# Patient Record
Sex: Male | Born: 1940 | Race: White | Hispanic: No | Marital: Married | Smoking: Former smoker
Health system: Southern US, Community
[De-identification: ages and names within clinical notes are randomized; demographics above are authoritative.]

## PROBLEM LIST (undated history)

## (undated) DIAGNOSIS — N183 Chronic kidney disease, stage 3 unspecified: Secondary | ICD-10-CM

## (undated) DIAGNOSIS — I10 Essential (primary) hypertension: Secondary | ICD-10-CM

## (undated) DIAGNOSIS — G4733 Obstructive sleep apnea (adult) (pediatric): Secondary | ICD-10-CM

## (undated) DIAGNOSIS — K219 Gastro-esophageal reflux disease without esophagitis: Secondary | ICD-10-CM

## (undated) DIAGNOSIS — M1990 Unspecified osteoarthritis, unspecified site: Secondary | ICD-10-CM

## (undated) DIAGNOSIS — I471 Supraventricular tachycardia, unspecified: Secondary | ICD-10-CM

## (undated) DIAGNOSIS — C259 Malignant neoplasm of pancreas, unspecified: Secondary | ICD-10-CM

## (undated) DIAGNOSIS — T7840XA Allergy, unspecified, initial encounter: Secondary | ICD-10-CM

## (undated) DIAGNOSIS — E785 Hyperlipidemia, unspecified: Secondary | ICD-10-CM

## (undated) DIAGNOSIS — N201 Calculus of ureter: Secondary | ICD-10-CM

## (undated) DIAGNOSIS — Z9989 Dependence on other enabling machines and devices: Secondary | ICD-10-CM

## (undated) DIAGNOSIS — C801 Malignant (primary) neoplasm, unspecified: Secondary | ICD-10-CM

## (undated) DIAGNOSIS — IMO0001 Reserved for inherently not codable concepts without codable children: Secondary | ICD-10-CM

## (undated) DIAGNOSIS — N200 Calculus of kidney: Secondary | ICD-10-CM

## (undated) DIAGNOSIS — I2510 Atherosclerotic heart disease of native coronary artery without angina pectoris: Secondary | ICD-10-CM

## (undated) HISTORY — DX: Hyperlipidemia, unspecified: E78.5

## (undated) HISTORY — PX: OTHER SURGICAL HISTORY: SHX169

## (undated) HISTORY — DX: Atherosclerotic heart disease of native coronary artery without angina pectoris: I25.10

## (undated) HISTORY — DX: Supraventricular tachycardia: I47.1

## (undated) HISTORY — DX: Supraventricular tachycardia, unspecified: I47.10

---

## 1992-06-15 HISTORY — PX: CORONARY ANGIOPLASTY: SHX604

## 1998-06-15 HISTORY — PX: CARDIAC CATHETERIZATION: SHX172

## 2000-06-15 HISTORY — PX: CARDIAC ELECTROPHYSIOLOGY STUDY AND ABLATION: SHX1294

## 2000-09-09 ENCOUNTER — Inpatient Hospital Stay (HOSPITAL_COMMUNITY): Admission: EM | Admit: 2000-09-09 | Discharge: 2000-09-10 | Payer: Self-pay | Admitting: Emergency Medicine

## 2000-09-09 ENCOUNTER — Encounter: Payer: Self-pay | Admitting: Emergency Medicine

## 2000-10-28 ENCOUNTER — Ambulatory Visit (HOSPITAL_COMMUNITY): Admission: RE | Admit: 2000-10-28 | Discharge: 2000-10-29 | Payer: Self-pay | Admitting: Internal Medicine

## 2000-12-01 ENCOUNTER — Encounter: Admission: RE | Admit: 2000-12-01 | Discharge: 2000-12-27 | Payer: Self-pay | Admitting: Internal Medicine

## 2001-05-31 ENCOUNTER — Encounter: Admission: RE | Admit: 2001-05-31 | Discharge: 2001-06-14 | Payer: Self-pay | Admitting: Orthopedic Surgery

## 2001-07-06 ENCOUNTER — Encounter: Admission: RE | Admit: 2001-07-06 | Discharge: 2001-09-16 | Payer: Self-pay | Admitting: Internal Medicine

## 2001-10-04 ENCOUNTER — Encounter (HOSPITAL_BASED_OUTPATIENT_CLINIC_OR_DEPARTMENT_OTHER): Admission: RE | Admit: 2001-10-04 | Discharge: 2002-01-02 | Payer: Self-pay | Admitting: Orthopedic Surgery

## 2003-06-19 ENCOUNTER — Encounter: Admission: RE | Admit: 2003-06-19 | Discharge: 2003-06-19 | Payer: Self-pay | Admitting: Urology

## 2003-06-22 ENCOUNTER — Ambulatory Visit (HOSPITAL_COMMUNITY): Admission: RE | Admit: 2003-06-22 | Discharge: 2003-06-22 | Payer: Self-pay | Admitting: Urology

## 2003-06-22 ENCOUNTER — Ambulatory Visit (HOSPITAL_BASED_OUTPATIENT_CLINIC_OR_DEPARTMENT_OTHER): Admission: RE | Admit: 2003-06-22 | Discharge: 2003-06-22 | Payer: Self-pay | Admitting: Urology

## 2003-06-22 HISTORY — PX: OTHER SURGICAL HISTORY: SHX169

## 2003-08-03 ENCOUNTER — Emergency Department (HOSPITAL_COMMUNITY): Admission: EM | Admit: 2003-08-03 | Discharge: 2003-08-03 | Payer: Self-pay | Admitting: Emergency Medicine

## 2005-03-26 ENCOUNTER — Inpatient Hospital Stay (HOSPITAL_COMMUNITY): Admission: EM | Admit: 2005-03-26 | Discharge: 2005-03-28 | Payer: Self-pay | Admitting: Emergency Medicine

## 2005-03-26 ENCOUNTER — Encounter (INDEPENDENT_AMBULATORY_CARE_PROVIDER_SITE_OTHER): Payer: Self-pay | Admitting: Specialist

## 2005-03-26 HISTORY — PX: LAPAROSCOPIC CHOLECYSTECTOMY: SUR755

## 2005-06-12 ENCOUNTER — Ambulatory Visit (HOSPITAL_BASED_OUTPATIENT_CLINIC_OR_DEPARTMENT_OTHER): Admission: RE | Admit: 2005-06-12 | Discharge: 2005-06-12 | Payer: Self-pay | Admitting: Family Medicine

## 2005-06-21 ENCOUNTER — Ambulatory Visit: Payer: Self-pay | Admitting: Internal Medicine

## 2006-01-20 ENCOUNTER — Ambulatory Visit: Payer: Self-pay | Admitting: Internal Medicine

## 2006-02-04 ENCOUNTER — Ambulatory Visit: Payer: Self-pay | Admitting: Internal Medicine

## 2006-02-04 ENCOUNTER — Encounter (INDEPENDENT_AMBULATORY_CARE_PROVIDER_SITE_OTHER): Payer: Self-pay | Admitting: Specialist

## 2006-06-28 ENCOUNTER — Ambulatory Visit: Payer: Self-pay | Admitting: Cardiology

## 2006-06-29 LAB — CONVERTED CEMR LAB
Chloride: 106 meq/L (ref 96–112)
Creatinine, Ser: 1 mg/dL (ref 0.4–1.5)
Glucose, Bld: 94 mg/dL (ref 70–99)
HCT: 41.7 % (ref 39.0–52.0)
Hemoglobin: 14.5 g/dL (ref 13.0–17.0)
MCV: 97.1 fL (ref 78.0–100.0)
RDW: 12.1 % (ref 11.5–14.6)
Sodium: 138 meq/L (ref 135–145)
WBC: 10 10*3/uL (ref 4.5–10.5)

## 2006-06-30 ENCOUNTER — Ambulatory Visit: Payer: Self-pay | Admitting: Cardiology

## 2006-06-30 ENCOUNTER — Inpatient Hospital Stay (HOSPITAL_BASED_OUTPATIENT_CLINIC_OR_DEPARTMENT_OTHER): Admission: RE | Admit: 2006-06-30 | Discharge: 2006-06-30 | Payer: Self-pay | Admitting: Cardiology

## 2006-07-07 ENCOUNTER — Ambulatory Visit: Payer: Self-pay

## 2006-07-14 ENCOUNTER — Ambulatory Visit: Payer: Self-pay | Admitting: Cardiology

## 2006-09-06 ENCOUNTER — Ambulatory Visit: Payer: Self-pay | Admitting: Cardiology

## 2007-04-26 ENCOUNTER — Ambulatory Visit: Payer: Self-pay | Admitting: Cardiology

## 2007-04-26 LAB — CONVERTED CEMR LAB
Basophils Absolute: 0.1 10*3/uL (ref 0.0–0.1)
CO2: 28 meq/L (ref 19–32)
Creatinine, Ser: 1 mg/dL (ref 0.4–1.5)
Glucose, Bld: 146 mg/dL — ABNORMAL HIGH (ref 70–99)
HCT: 41.1 % (ref 39.0–52.0)
Hemoglobin: 14.6 g/dL (ref 13.0–17.0)
INR: 0.9 (ref 0.8–1.0)
MCHC: 35.5 g/dL (ref 30.0–36.0)
Monocytes Absolute: 0.9 10*3/uL — ABNORMAL HIGH (ref 0.2–0.7)
Neutrophils Relative %: 58.4 % (ref 43.0–77.0)
Potassium: 4 meq/L (ref 3.5–5.1)
RDW: 12 % (ref 11.5–14.6)
Sodium: 139 meq/L (ref 135–145)
aPTT: 27.2 s (ref 21.7–29.8)

## 2007-04-27 ENCOUNTER — Inpatient Hospital Stay (HOSPITAL_BASED_OUTPATIENT_CLINIC_OR_DEPARTMENT_OTHER): Admission: RE | Admit: 2007-04-27 | Discharge: 2007-04-27 | Payer: Self-pay | Admitting: Cardiology

## 2007-04-27 ENCOUNTER — Ambulatory Visit: Payer: Self-pay | Admitting: Cardiology

## 2007-04-27 HISTORY — PX: CARDIAC CATHETERIZATION: SHX172

## 2007-05-02 ENCOUNTER — Ambulatory Visit: Payer: Self-pay | Admitting: Cardiology

## 2007-05-03 ENCOUNTER — Ambulatory Visit: Payer: Self-pay | Admitting: Internal Medicine

## 2007-05-24 ENCOUNTER — Ambulatory Visit: Payer: Self-pay | Admitting: Cardiology

## 2007-05-25 ENCOUNTER — Ambulatory Visit: Payer: Self-pay | Admitting: Cardiology

## 2007-05-25 LAB — CONVERTED CEMR LAB
Albumin: 3.6 g/dL (ref 3.5–5.2)
Alkaline Phosphatase: 43 units/L (ref 39–117)
LDL Cholesterol: 34 mg/dL (ref 0–99)
Total CHOL/HDL Ratio: 2.9
VLDL: 30 mg/dL (ref 0–40)

## 2007-08-23 ENCOUNTER — Ambulatory Visit: Payer: Self-pay | Admitting: Cardiology

## 2007-08-25 ENCOUNTER — Ambulatory Visit: Payer: Self-pay | Admitting: Cardiology

## 2007-08-25 LAB — CONVERTED CEMR LAB
Direct LDL: 53.1 mg/dL
HDL: 34.9 mg/dL — ABNORMAL LOW (ref >39.0)
Triglycerides: 279 mg/dL (ref 0–149)
VLDL: 56 mg/dL — ABNORMAL HIGH (ref 0–40)

## 2007-09-15 ENCOUNTER — Ambulatory Visit: Payer: Self-pay | Admitting: Cardiology

## 2007-12-08 ENCOUNTER — Ambulatory Visit: Payer: Self-pay | Admitting: Internal Medicine

## 2007-12-08 LAB — CONVERTED CEMR LAB
ALT: 30 units/L (ref 0–53)
Alkaline Phosphatase: 48 units/L (ref 39–117)
Bilirubin, Direct: 0.1 mg/dL (ref 0.0–0.3)
Total Bilirubin: 0.8 mg/dL (ref 0.3–1.2)
Total Protein: 6.9 g/dL (ref 6.0–8.3)

## 2008-04-30 ENCOUNTER — Ambulatory Visit: Payer: Self-pay | Admitting: Cardiology

## 2008-11-22 ENCOUNTER — Telehealth: Payer: Self-pay | Admitting: Cardiology

## 2008-12-15 ENCOUNTER — Ambulatory Visit: Payer: Self-pay | Admitting: Diagnostic Radiology

## 2008-12-15 ENCOUNTER — Emergency Department (HOSPITAL_BASED_OUTPATIENT_CLINIC_OR_DEPARTMENT_OTHER): Admission: EM | Admit: 2008-12-15 | Discharge: 2008-12-15 | Payer: Self-pay | Admitting: Emergency Medicine

## 2009-11-14 ENCOUNTER — Telehealth: Payer: Self-pay | Admitting: Cardiology

## 2010-01-09 ENCOUNTER — Telehealth: Payer: Self-pay | Admitting: Cardiology

## 2010-02-06 ENCOUNTER — Ambulatory Visit: Payer: Self-pay | Admitting: Cardiology

## 2010-02-06 DIAGNOSIS — I2510 Atherosclerotic heart disease of native coronary artery without angina pectoris: Secondary | ICD-10-CM

## 2010-02-06 DIAGNOSIS — I119 Hypertensive heart disease without heart failure: Secondary | ICD-10-CM | POA: Insufficient documentation

## 2010-02-06 DIAGNOSIS — E782 Mixed hyperlipidemia: Secondary | ICD-10-CM

## 2010-07-17 NOTE — Assessment & Plan Note (Signed)
Summary: f1y   Visit Type:  Follow-up Primary Provider:  Dr. Cam Hai  CC:  No cardiac complaints- only occasional dizziness.Marland Kitchen  History of Present Illness: The patient is 70 years old returned for management of CAD.  He had a remote PTCA and DCA of the LAD. He underwent catheterization in 2000 and because of anginal chest pain and was found to have nonobstructive CAD. 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  He couldn't afford the latter and later Cardizem was discontinued and he is currently only on metoprolol.  He says he's been doing quite well. He's had no chest pain shortness of breath or palpitations. 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 He is fairly active and has had no chest pain with these activities.  His other problems include diabetes and hyperlipidemia. He's had elevated triglycerides and low HDL. His primary care physician is managing these problems he also has a history of SVT and is status post ablation for this.  Current Medications (verified): 1)  Omeprazole 20 Mg Tbec (Omeprazole) .... Take 2 Capsule By Mouth Once A Day 2)  Simvastatin 40 Mg Tabs (Simvastatin) .... Take 1 Tablet By Mouth Once A Day 3)  Metoprolol Tartrate 100 Mg Tabs (Metoprolol Tartrate) .... Take Two Tablets Every Morning and One Tablet Every Evening 4)  Glimepiride 4 Mg Tabs (Glimepiride) .... Take One Tab By Mouth Once Daily 5)  Metformin Hcl 1000 Mg Tabs (Metformin Hcl) .... Take One Tab By Mouth Once Daily 6)  Doxazosin Mesylate 2 Mg Tabs (Doxazosin Mesylate) .... Take One Tab By Mouth Once Daily 7)  Lisinopril 20 Mg Tabs (Lisinopril) .... Take One Tab By Mouth Once Daily 8)  Januvia 100 Mg Tabs (Sitagliptin Phosphate) .... Take One Tab By Mouth Once Daily 9)  Aspirin 81 Mg Tbec (Aspirin) .... Take One Tablet By Mouth Daily 10)  Centrum Silver Ultra Mens  Tabs (Multiple Vitamins-Minerals) .... Take One Tab By Mouth Once  Daily 11)  Fish Oil 1000 Mg Caps (Omega-3 Fatty Acids) .... Take 4 Tabs By Mouth Once Daily  Allergies (verified): 1)  ! Penicillin 2)  ! Codeine  Past History:  Past Medical History: DM HL S/P RFA SVT  Review of Systems       ROS is negative except as outlined in HPI.   Vital Signs:  Patient profile:   70 year old male Height:      77 inches Weight:      286 pounds BMI:     34.04 BP sitting:   126 / 60  (right arm) Cuff size:   large  Vitals Entered By: Sherri Rad, RN, BSN (February 06, 2010 3:42 PM)  Physical Exam  Additional Exam:  Gen. Well-nourished, in no distress   Neck: No JVD, thyroid not enlarged, no carotid bruits Lungs: No tachypnea, clear without rales, rhonchi or wheezes Cardiovascular: Rhythm regular, PMI not displaced,  heart sounds  normal, no murmurs or gallops, no peripheral edema, pulses normal in all 4 extremities. Abdomen: BS normal, abdomen soft and non-tender without masses or organomegaly, no hepatosplenomegaly. MS: No deformities, no cyanosis or clubbing   Neuro:  No focal sns   Skin:  no lesions    Impression & Recommendations:  Problem # 1:  CORONARY ATHEROSCLEROSIS NATIVE CORONARY ARTERY (ICD-414.01) He had remote PTCA and [facility] of LAD and had nonobstructive CAD at catheterization in 2009. He has a history of what  we have thought to be microvascular angina but is not having any recent symptoms. We will continue current therapy. The following medications were removed from the medication list:    Ranexa 1000 Mg Xr12h-tab (Ranolazine) .Marland Kitchen... Take 1 tablet by mouth twice a day His updated medication list for this problem includes:    Metoprolol Tartrate 100 Mg Tabs (Metoprolol tartrate) .Marland Kitchen... Take two tablets every morning and one tablet every evening    Lisinopril 20 Mg Tabs (Lisinopril) .Marland Kitchen... Take one tab by mouth once daily    Aspirin 81 Mg Tbec (Aspirin) .Marland Kitchen... Take one tablet by mouth daily  Orders: EKG w/ Interpretation  (93000)  Problem # 2:  MIXED HYPERLIPIDEMIA (ICD-272.2) This is managed with simvastatin and is managed by his primary care physician. His updated medication list for this problem includes:    Simvastatin 40 Mg Tabs (Simvastatin) .Marland Kitchen... Take 1 tablet by mouth once a day  Orders: EKG w/ Interpretation (93000)  Problem # 3:  BEN HTN HEART DISEASE WITHOUT HEART FAIL (ICD-402.10) This is controlled on current medications. His updated medication list for this problem includes:    Metoprolol Tartrate 100 Mg Tabs (Metoprolol tartrate) .Marland Kitchen... Take two tablets every morning and one tablet every evening    Doxazosin Mesylate 2 Mg Tabs (Doxazosin mesylate) .Marland Kitchen... Take one tab by mouth once daily    Lisinopril 20 Mg Tabs (Lisinopril) .Marland Kitchen... Take one tab by mouth once daily    Aspirin 81 Mg Tbec (Aspirin) .Marland Kitchen... Take one tablet by mouth daily  Orders: EKG w/ Interpretation (93000)  Patient Instructions: 1)  Your physician recommends that you continue on your current medications as directed. Please refer to the Current Medication list given to you today. 2)  Your physician wants you to follow-up in: 1 year with Dr. Clifton James.   You will receive a reminder letter in the mail two months in advance. If you don't receive a letter, please call our office to schedule the follow-up appointment.

## 2010-07-17 NOTE — Progress Notes (Signed)
Summary: refill pt has been on 100 mg tid  Phone Note Refill Request Message from:  Patient on November 14, 2009 3:48 PM  Refills Requested: Medication #1:  METOPROLOL SUCCINATE 100 MG XR24H-TAB Take 3 tablet by mouth as directed   Supply Requested: 3 months   Notes: Please see new Dosage [facility]   Method Requested: Fax to Local Pharmacy Initial call taken by: Migdalia Dk,  November 14, 2009 3:48 PM  Follow-up for Phone Call        last ov with Dr Juanda Chance 08-23-07 -?metoprolol dose Metoprolol was 50 mg three times a day, who increased it??  Also pt needs ROV for f/u  Sander Nephew, RN Pt returning call about his medication Judie Grieve  November 15, 2009 2:22 PM  Additional Follow-up for Phone Call Additional follow up Details #1::        [facility] pt has been on 100 mg Metoprolol tartrate 100 mg three times a day are well over one year.  RX refilled for 100mg  three times a day # 90 with one refill only.  pt needs appt to be seen. he is aware and will schedule today Additional Follow-up by: Charolotte Capuchin, RN,  November 15, 2009 3:50 PM

## 2010-07-17 NOTE — Progress Notes (Signed)
Summary: metoprolol done daj  Prescriptions: METOPROLOL TARTRATE 100 MG TABS (METOPROLOL TARTRATE) Take one tablet by mouth three times a day as directed  #90 x 6   Entered by:   Burnett Kanaris, CNA   Authorized by:   Lenoria Farrier, MD, [facility]   Signed by:   Burnett Kanaris, CNA on 01/09/2010   Method used:   [facility]ronically to        [facility] [phone]* (retail)       [address]       [place], [place]  [address]       Ph: [phone]       Fax: [phone]   RxID:   [id number]

## 2010-09-22 LAB — URINALYSIS, ROUTINE W REFLEX MICROSCOPIC
Glucose, UA: NEGATIVE mg/dL
Ketones, ur: NEGATIVE mg/dL
Protein, ur: NEGATIVE mg/dL
pH: 5.5 (ref 5.0–8.0)

## 2010-09-22 LAB — URINE MICROSCOPIC-ADD ON

## 2010-09-26 ENCOUNTER — Ambulatory Visit (HOSPITAL_BASED_OUTPATIENT_CLINIC_OR_DEPARTMENT_OTHER)
Admission: RE | Admit: 2010-09-26 | Discharge: 2010-09-26 | Disposition: A | Discharge status: Home / Self Care | Payer: Medicare Other | Source: Ambulatory Visit | Attending: Urology | Admitting: Urology

## 2010-09-26 ENCOUNTER — Ambulatory Visit (HOSPITAL_COMMUNITY): Payer: Medicare Other

## 2010-09-26 DIAGNOSIS — N201 Calculus of ureter: Secondary | ICD-10-CM | POA: Insufficient documentation

## 2010-09-26 DIAGNOSIS — Z9861 Coronary angioplasty status: Secondary | ICD-10-CM | POA: Insufficient documentation

## 2010-09-26 DIAGNOSIS — Z79899 Other long term (current) drug therapy: Secondary | ICD-10-CM | POA: Insufficient documentation

## 2010-09-26 DIAGNOSIS — N200 Calculus of kidney: Secondary | ICD-10-CM | POA: Insufficient documentation

## 2010-09-26 DIAGNOSIS — I10 Essential (primary) hypertension: Secondary | ICD-10-CM | POA: Insufficient documentation

## 2010-09-26 DIAGNOSIS — Z01812 Encounter for preprocedural laboratory examination: Secondary | ICD-10-CM | POA: Insufficient documentation

## 2010-09-26 DIAGNOSIS — I2510 Atherosclerotic heart disease of native coronary artery without angina pectoris: Secondary | ICD-10-CM | POA: Insufficient documentation

## 2010-09-26 DIAGNOSIS — E119 Type 2 diabetes mellitus without complications: Secondary | ICD-10-CM | POA: Insufficient documentation

## 2010-09-26 DIAGNOSIS — K219 Gastro-esophageal reflux disease without esophagitis: Secondary | ICD-10-CM | POA: Insufficient documentation

## 2010-09-26 DIAGNOSIS — E669 Obesity, unspecified: Secondary | ICD-10-CM | POA: Insufficient documentation

## 2010-09-26 DIAGNOSIS — Z01818 Encounter for other preprocedural examination: Secondary | ICD-10-CM | POA: Insufficient documentation

## 2010-09-26 HISTORY — PX: OTHER SURGICAL HISTORY: SHX169

## 2010-09-26 LAB — POCT I-STAT 4, (NA,K, GLUC, HGB,HCT)
Glucose, Bld: 217 mg/dL — ABNORMAL HIGH (ref 70–99)
HCT: 44 % (ref 39.0–52.0)
Hemoglobin: 15 g/dL (ref 13.0–17.0)
Potassium: 4.1 mEq/L (ref 3.5–5.1)
Sodium: 138 mEq/L (ref 135–145)

## 2010-09-26 LAB — GLUCOSE, CAPILLARY: Glucose-Capillary: 192 mg/dL — ABNORMAL HIGH (ref 70–99)

## 2010-10-02 NOTE — Op Note (Signed)
[name], [name]                 ACCOUNT NO.:  [id number]  MEDICAL RECORD NO.:  [id number]            PATIENT TYPE:  LOCATION:                                 FACILITY:  PHYSICIAN:  Terel Bann C. Vernie Ammons, M.D.       DATE OF BIRTH:  DATE OF PROCEDURE:  09/26/2010 DATE OF DISCHARGE:                              OPERATIVE REPORT   PREOPERATIVE DIAGNOSIS:  Right ureteral calculi.  POSTOPERATIVE DIAGNOSIS:  Right ureteral calculi.  PROCEDURES: 1. Right retrograde pyelogram including interpretation. 2. Right ureteroscopy. 3. Laser lithotripsy. 4. Ureteroscopic stone extraction. 5. Double-J stent placement.  SURGEON:  Ion Gonnella C. Vernie Ammons, MD  ANESTHESIA:  General.  SPECIMEN:  Stone given to the patient.  ESTIMATED BLOOD LOSS:  Minimal.  DRAINS:  5-French, 26 cm Polaris stent in the right ureter (with string).  COMPLICATIONS:  None.  INDICATIONS:  The patient is a 70 year old male who was seen in the office yesterday with severe right renal colic, which was found to be secondary to ureteral stones causing obstruction.  We discussed the treatment options and he has elected to proceed with ureteroscopic extraction of these stones, as they were too faint to be seen for lithotripsy.  The risks, complications, alternatives, and limitations have been discussed.  He understands and has elected to proceed.  DESCRIPTION OF OPERATION:  After informed consent, the patient was brought to the major OR, placed on the table and administered general anesthesia and then moved to the dorsal lithotomy position.  His genitalia was sterilely prepped and draped and an official time-out was then performed.  Rigid cystoscopy was performed with the 22-French cystoscope, which was passed under direct vision down the urethra, which was noted to be normal.  The prostatic urethra revealed no lesions and was mildly obstructing.  The bladder was then entered and noted to be free of any tumor, stones or  inflammatory lesions on complete and systematic inspection.  Ureteral orifices were normal in configuration and position.  The 6-French open-ended ureteral catheter was passed through the cystoscope and into the right ureteral orifice.  I then injected full- strength contrast under direct fluoroscopic visualization and noted, as the contrast was injected up the right ureter, several filling defects in the mid ureter.  Proximal to this, the ureter was dilated.  There were no other abnormalities noted of the ureter.  The collecting system appeared normal.  There were filling defects in the collecting system consistent with the stones that were seen on his preoperative KUB.  A 0.038-inch floppy tip guidewire was then passed through the open-ended catheter and up the right ureter into the area of the renal pelvis.  I then passed the ureteral access sheath over the guidewire and was able to gently advance this through the intramural ureter and up to the level of the stones where the guidewire and inner portion of the access sheaths were then removed.  Flexible ureteroscopy was then performed using a 6-French flexible ureteroscope and I was able to identify one of the stones and fragmented it using the 200 micron holmium laser fiber. Once this stone was  fully fragmented, I inserted the nitinol basket and extracted the fragmented portions of the stone.  I then reinserted the ureteroscope and fragmented further stone and extracted that.  Finally a large stone fragment most proximally was fragmented with the laser and then extracted with ureteroscope.  I then inserted the ureteroscope one last time through the access sheath and passed this to well beyond where the stone was located.  No stone fragments had migrated proximally.  I then removed the ureteroscope and access sheaths under direct vision after passing a guidewire through the ureteroscope and into the area of the renal pelvis.  No stone  fragments were noted along the course of the ureter and the ureter was noted to be intact without any bleeding or injury.  The guidewire was then left in place and the cystoscope was back loaded over the guidewire.  The stent was then passed over the guidewire through the cystoscope and up the right ureter into the area of the renal pelvis and as I removed the guidewire, good curl was noted in the renal pelvis.  I then drained the bladder and removed the cystoscope leaving the tether attached to the distal aspect of the stent and affixed to the dorsum of the penis.  The patient was then awakened and taken to recovery room in stable and satisfactory condition.  He tolerated the procedure well and there were no intraoperative complications.  He will be given a prescription for Pyridium 200 mg #36 and has a prescription for Dilaudid that I gave him yesterday should he have any pain.  He will follow up in my office in 1 week and written instructions were given at discharge.     Adeola Dennen C. Vernie Ammons, M.D.     MCO/MEDQ  D:  09/26/2010  T:  09/26/2010  Job:  [id number]  Electronically Signed by Ihor Gully M.D. on 10/02/2010 07:39:59 PM

## 2010-10-28 NOTE — Cardiovascular Report (Signed)
[name], [name]                 ACCOUNT NO.:  [id number]   MEDICAL RECORD NO.:  [id number]          PATIENT TYPE:  OIB   LOCATION:  1961                         FACILITY:  [facility]   PHYSICIAN:  [name] R. Juanda Chance, MD, FACCDATE OF BIRTH:  04/14/1941   DATE OF PROCEDURE:  04/27/2007  DATE OF DISCHARGE:                            CARDIAC CATHETERIZATION   CLINICAL HISTORY:  [name] is 70 years old and works in [employer].  He had a remote PCI with directional atherectomy of the LAD.  He developed recurrent anginal symptoms in January of this year and  underwent catheterization; was found to have nonobstructive disease.  Following his catheterization he had a  Myoview scan which showed no  defect and no ST changes; but we felt he most probably had microvascular  angina.  We tried him on Cardizem 180 mg for a period time, which gave  possibly some improvement.  Three weeks  ago while he was walking a [place]  at the football game at [place] he developed severe substernal chest  pain, which took a while to resolve.  He had another episode similar  about a week ago.  He came in for evaluation yesterday and we set him up  for evaluation for a catheterization today.   PROCEDURE:  The procedure was performed by the right femoral artery and  arterial sheath and 4-French preformed coronary catheters.  A front wall  arterial puncture was performed and Omnipaque contrast was used.  The  third injection of the right coronary appeared to cause a slight  disruption of the intima of the proximal right coronary artery.  At no  point was there any damping of the pressure through the catheter.  We  pulled the catheter back and took repeat pictures, and there was minimal  irregularity at the sites and no compromise of the lumen.  He had no  chest pain.  He tolerated the procedure well and left the laboratory in  satisfactory condition.   RESULTS:  1. Left main coronary was free of  significant disease.  2. The left anterior descending artery had moderately heavy      calcification proximally and irregularities.  The LAD gave rise to      a septal perforator and 2 diagonal branches.  There was 30%      narrowing in the mid vessel.  3. The circumflex artery was a codominant vessel that gave rise to a      ramus branch, a small marginal branch, a large marginal branch and      3 posterolateral branches.  There was calcification in the proximal      vessel.  There was 50% narrowing in the proximal vessel and 40%      narrowing in the proximal portion of the second marginal branch.  4. The right coronary artery was a moderate-sized vessel that gave      rise to 2 right ventricle branches and a posterior descending      branch.  There was 40-50% narrowing in the proximal right coronary.      There  was 40% narrowing in the distal right coronary.   LEFT VENTRICULOGRAPHY:  The left ventriculogram performed in the RAO  projection showed good wall motion with no areas of hypokinesis.  The  estimated fraction was 60%.   HEMODYNAMIC DATA:  1. Aortic pressure:  113/62 with mean of 84.  2. Left ventricular pressure:  113/11.   CONCLUSION:  Coronary artery disease with 30% narrowing in the mid-LAD,  50% narrowing in the proximal circumflex artery; with 40% narrowing in  the second marginal branch, 40-50% in the proximal right coronary, with  posterior narrowing in the distal right coronary artery and normal left  ventricular function.   RECOMMENDATIONS:  The patient has nonobstructive coronary disease, but  symptoms that are fairly typical for angina.  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  He has been on beta blockers and Cardizem, but  only a low dose of Cardizem 180.  We will consider either increasing the  Cardizem to 240, or possibly treating him with Ranexa.  I may consider a  treadmill test to see if we can document symptoms for initiating  therapy.       [name] Elvera Lennox Juanda Chance, MD, [facility]  Electronically Signed     BRB/MEDQ  D:  04/27/2007  T:  04/27/2007  Job:  [id number]   cc:   Donia Guiles, M.D.

## 2010-10-28 NOTE — Assessment & Plan Note (Signed)
[facility]                            CARDIOLOGY OFFICE NOTE   NAME:[name], [name]                        MRN:          [id number]  DATE:08/23/2007                            DOB:          Oct 01, 1940    PRIMARY CARE PHYSICIAN:  Donia Guiles, M.D.   CLINICAL HISTORY:  [name] is 70 years old and returned for management  of his coronary heart disease and angina.  He had a remote angioplasty  and arthrectomy of his LAD and recently developed exertional chest pain  underwent catheterization was found to have nonobstructive disease.  We  felt his syndrome was consistent with microvascular angina and we  treated him with Cardizem and Ranexa.  These gave borderline relief and  he is uncertain if these helped most his symptoms related to very severe  exertion rather than his usual daily activities.  The Ranexa was quite  expensive, so we stopped that and continued his other medications.  Since that time, he has done much better.  Had no recurrent increase in  activity levels, have been at least and much as before.   PAST MEDICAL HISTORY:  Significant for diabetes and hyperlipidemia.  We  switched him from Vytorin to simvastatin for cost reasons.  His LDL was  34 prior to this switch.   His current medications include glimepiride, aspirin, fish oil,  simvastatin, doxazosin, Prilosec, metformin and metoprolol.   EXAMINATION:  Today blood pressure was 122/78, pulse 74 and regular.  Weight was 285 pounds.  There is no venous distension.  Carotid pulses were full without bruits.  CHEST:  Was clear.  CARDIAC:  Rhythm was regular.  No murmurs or gallops.  ABDOMEN:  Soft without organomegaly.  Peripheral pulses.  No peripheral  edema.   IMPRESSION:  1. Coronary artery disease status post remote percutaneous coronary      intervention with nonobstructive disease at last catheterization      November 2008.  2. Microvascular angina and now currently  asymptomatic.  3. Hyperlipidemia.  4. Diabetes.  5. Status post ablation for supraventricular tachycardia.  6. Normal LV function.   RECOMMENDATIONS:  I think Mr. Reasons is doing quite well.  He is not  having any symptoms at present and we will not make any change in his  therapy.  He had laboratory work at Dr. Roselie Skinner office but not sure  he had a lipid profile.  If he did not, we will get a fasting lipid  profile to assess his response to simvastatin.  I will plan to see him  back in the year and he is to call us if he develops recurrent symptoms.     Bruce Elvera Lennox Juanda Chance, MD, [facility]  Electronically Signed    BRB/MedQ  DD: 08/23/2007  DT: 08/23/2007  Job #: [id number]   cc:   Donia Guiles, M.D.

## 2010-10-28 NOTE — Assessment & Plan Note (Signed)
[facility]                               LIPID CLINIC NOTE   NAME:[name], [name]                        MRN:          [id number]  DATE:12/08/2007                            DOB:          [birth year]    PRIMARY CARDIOLOGIST:  Everardo Beals. Juanda Chance, MD, [facility]   Return office visit for Lipid Clinic.   PAST MEDICAL HISTORY:  Hyperlipidemia, coronary artery disease status  post angioplasty in his LAD, diabetes mellitus, normal LV, status post  ablation for supraventricular tachycardia.   MEDICATIONS:  1. Metoprolol 200 mg in the morning and 100 mg in the evening.  2. Lisinopril 20 mg daily.  3. Simvastatin 40 mg daily.  4. Metformin 850 mg twice daily.  5. Amaryl 4 mg daily.  6. Aspirin 81 mg daily.  7. Folic acid 400 mcg daily.  8. Fish oil 1 g daily.  9. Multivitamin daily.  10.Doxazosin 2 mg daily.  11.Omeprazole 20 mg daily.   VITAL SIGNS:  Weight 278 pounds, blood pressure 118/60, heart rate in  the 70s, regular rhythm.   ASSESSMENT:  [name] is a very pleasant 70 year old gentleman who  returns to the Lipid Clinic today with no chest pain, no shortness of  breath, no muscle aches or pains.  His only complaint today is that he  continues to have dizziness when sitting to standing.  He did complain  of this same issue back at his April visit, though this time and at last  visit, we checked orthostatics, in which he does not have any  orthostatic hypotension, blood pressure is the same from sitting to  standing.  As stated, his heart rate is in regular rhythm.  He does  state that for a 2-week period that his dizziness was worse than it had  ever been where he almost passed out on several occasions when getting  out of his truck.  He does continue to have these symptoms, they are not  as severe as they had been for about that 2-week period about a month  ago.  He does state that he drinks about 6 diet cokes during the day,  small amount of water,  and some other fluids.  I encouraged him to  decrease his diet coke intake and to increase the water.  He is not  dehydrated.  He has started an exercise program, where he is walking 20-  30 minutes daily.  He, in the past 3 months, has lost about 9 pounds.  He has also done a great job decreasing his fried food and carbohydrates  in his meals.  He also has decreased the level of snacking.  He eats  fried food once a week approximately.  Occasionally, he will have two  days of fried food.  He understands that lifestyle modification is the  healthiest option for him along with his medications.  He did not come  in for his fasting blood work prior to his appointment; however, he is  fasting today.  I have had lab work drawn and will contact him with  these results.  Total cholesterol goal was less than 200, triglyceride  goal less than 150, LDL goal less than 70, and HDL goal greater than 40.  I have spoken with Dr. Juanda Chance regarding the dizziness when sitting to  standing.  He agrees that it is potentially not cardiac related and  potentially vestibular and ear related and recommends that [name] go  to see his primary physician to have this evaluated.   PLAN:  1. Continue current medication.  2. Continue low-fat, low-carbohydrate diet.  3. Continue exercise regimen.  4. Followup visit in 4 months for lipid panel and LFTs.  We will make      adjustments at that time.      Leota Sauers, PharmD  Electronically Signed      Jesse Sans. Daleen Squibb, MD, [facility]  Electronically Signed   LC/MedQ  DD: 12/08/2007  DT: 12/09/2007  Job #: [id number]

## 2010-10-28 NOTE — Assessment & Plan Note (Signed)
[facility]                               LIPID CLINIC NOTE   NAME:[name], [name]                        MRN:          [id number]  DATE:09/15/2007                            DOB:          [birth year]    First office visit for lipid clinic.   REFERRING PHYSICIAN:  Primary cardiologist, Everardo Beals. Juanda Chance, MD.   PAST MEDICAL HISTORY:  1. Hyperlipidemia.  2. Coronary heart disease, status post angioplasty and arthrectomy of      his LAD.  3. Diabetes mellitus.  4. Normal LV function, status post ablation for supraventricular      tachycardia.   MEDICATIONS:  1. Amaryl 4 mg daily.  2. Aspirin 81 mg daily.  3. Folic acid 400 mcg daily.  4. Fish oil 1000 mg daily.  5. Ocuvite once daily.  6. Doxazosin 2 mg daily.  7. Omeprazole 20 mg daily.  8. Metformin 850 mg twice daily.  9. Simvastatin 40 mg daily.  10.Metoprolol 200 mg in the morning, 100 mg in the p.m.  11.Lisinopril 20 mg daily.   VITAL SIGNS:  Weight 287 pounds.  Blood pressure 135/70 sitting, 128/70  standing.  Heart rate 70's.   LABORATORY DATA:  Total cholesterol 124, triglyceride 279, HDL 35, LDL  53.  LFTs within normal limits.   ASSESSMENT:  Mr. Okray is a very pleasant 70 year old gentleman who comes  to clinic today for the first time.  His total cholesterol is at goal of  less than 200.  His triglycerides are greater than goal of less than  150.  His HDL is less than goal of greater than 40 and his LDL is at  goal of less than 70.  He is compliant with current medication regimen.  He has no chest pain, no  shortness of breath, no muscle aches or pains.  Upon discussion with his cardiologist at his last visit, he would like  some guidance on diet and exercise to help bring his lipid goal within  guidelines.  His triglycerides are elevated.  However, his diabetes is  well-controlled, last A1c being 6.1.  His HDL is less than goal of  greater than 40.  He does not follow any type of  lifestyle modification  or low-fat, low-carbohydrate diet.  He also does no exercise other than  his activities of daily living.  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  He does not have a sedentary job by any  means; however, he does not do any consistent exercise on a daily basis.  He is agreeable to starting an exercise regimen of walking for 30  minutes a day.  He does not feel that this would be a problem to  incorporate into his life; however, the motivation factor is not there.  He says that after a 10-hour day of working at physical labor, he does  not feel like implementing an exercise regimen.  However, he does  understand that to prevent  the additional medications that this is  something that will be needed and necessary and understands that  lifestyle modification his a necessary part of a healthy lifestyle.  He  is not doing any exercise currently but will implement 30 minutes three  times a week.  As far as diet, he seems to eat a high fat content  breakfast.  He eats sausage and/or bacon with his eggs on wheat bread in  the morning.  He does this daily, therefore, and occasionally eats up to  four eggs at breakfast.  He does very little snacking throughout the day  given his work.  However, his afternoon meal seems fairly healthy with a  tuna salad on wheat bread on most days, occasionally a grilled chicken  sandwich or salad.  His evening meal seems to be somewhat healthy but  high on the carbohydrate content.  He does not eat any fried foods  unless they go out to dinner occasionally, and he eats mostly chicken,  lean pork, occasional steak or hamburger and occasional fish.  We had  lengthy conversation about increasing the fiber and decreasing the fat  in our morning meal.  He is willing to on most days eat a high-fiber  cereal breakfast, whether it be Cheerios or other high-fiber  cereal.  He  also is willing to increase the fiber fruits such as strawberries,  blueberries, apples pears, etc., and decrease the amount of bacon and  eggs.  He is willing to maintain one egg on 3 days a week and limit to  one piece of bacon or sausage with that egg 3 days a week.  I still feel  like this is on the higher side of the amount of fat that he should be  eating but also understand that making large changes in this gentleman  will not go over well and normally he will continue a heart-healthy diet  on more than most days of the week.  We will make small changes as time  goes on, hopefully to limiting eggs and sausage and bacon to once a week  in the future.   PLAN:  1. To start exercise regimen 30 minutes three times a week.  2. Decrease fats and carbohydrates in diet.  3. Increase fish oil to 4 capsules daily.  4. Follow-up visit in 3 months for lipid panel and LFTs and will make      adjustments at that time.      Leota Sauers, PharmD  Electronically Signed      Jesse Sans. Daleen Squibb, MD, [facility]  Electronically Signed   LC/MedQ  DD: 09/15/2007  DT: 09/15/2007  Job #: [id number]

## 2010-10-28 NOTE — Assessment & Plan Note (Signed)
[facility]                            CARDIOLOGY OFFICE NOTE   NAME:Lile, MASIYAH ENGEN                        MRN:          [id number]  DATE:04/26/2007                            DOB:          July 16, 1940    PRIMARY CARE PHYSICIAN:  Donia Guiles, M.D.   CLINICAL HISTORY:  Mr. Betke is 70 years old and worked in [employer]. He has had remote coronary angioplasty and  directional arthrectomy of the LAD a number of years ago and was  evaluated earlier this year for exertional chest tightness that sounded  typical for angina. Surprisingly, he had non-obstructive disease at his  catheterization. 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 We gave him  a trial of Cardizem CD 180 mg which has given him maybe some mild relief  of his symptoms and have improved some until recently. Three weeks ago  and then about a week ago, while he was going to the football game at  [facility] and walking up a [place], he had severe substernal pressure  causing him to stop and not resolving very quickly. Because of concern  of possible new blockage, he came in today for further evaluation.   PAST MEDICAL HISTORY:  1. Diabetes.  2. Hyperlipidemia.   CURRENT MEDICATIONS:  1. Glimepiride.  2. Metformin.  3. Aspirin.  4. Fish oil.  5. Vytorin.  6. Capsaicin.  7. Prilosec.   PHYSICAL EXAMINATION:  Blood pressure 121/79, pulse 97 and regular.  There was no venous distention. The carotid pulses were full without  bruits.  CHEST: Was clear.  CARDIAC: Rhythm was regular. I could hear no murmurs or gallops.  ABDOMEN: Was soft with normal bowel sounds. There was no  hepatosplenomegaly.  The peripheral pulses were equal and there was no pedal edema.   Electrocardiogram showed sinus rhythm at 97 and was otherwise normal.   IMPRESSION:  1. Exertional chest pressure, rule out coronary artery disease versus      microvascular angina.  2. Coronary artery disease  with remote percutaneous transluminal      coronary angioplasty and directional arthrectomy of the left      anterior descending artery  and non-obstructive disease at      catheterization in January of 2008.  3. Hyperlipidemia.  4. Diabetes.  5. Status post ablation for supraventricular tachycardia.  6. Normal left ventricular function.   RECOMMENDATIONS:  Mr. Seymour's symptoms are certainly characteristic of  angina and questions whether it is due to micro vascular angina or  progression of his coronary disease. I think repeat angiography is the  best way to evaluate the problem and will plan to arrange for him to  come in tomorrow for catheterization in the outpatient laboratory. I am  not clear if he is on metoprolol 100 mg daily or not and if he is will  increase that to 100 in the morning and 50 in the afternoon. I will not  resume his Cardizem until we see the results of his tests.   He also has symptoms of dysphagia and reflux and has  been taking  Prilosec. He has seen Dr. Lina Sar in the past for colonoscopy. Will  arrange for him to see her in consultation also in the near future.     Bruce Elvera Lennox Juanda Chance, MD, [facility]  Electronically Signed    BRB/MedQ  DD: 04/26/2007  DT: 04/26/2007  Job #: [id number]

## 2010-10-28 NOTE — Assessment & Plan Note (Signed)
[facility]                            CARDIOLOGY OFFICE NOTE   NAME:[name] [name] GUM                        MRN:          [id number]  DATE:05/24/2007                            DOB:          [birth year]    PRIMARY CARE PHYSICIAN:  Dr. Donia Guiles.   CLINICAL HISTORY:  Mr.  [name] [name] is 70 years old and returned for management  of his angina.  He had remote coronary angioplasty and previous  directional atherectomy of the LAD a number of years ago.  He recently  developed symptoms of exertional chest pain and underwent a  catheterization, and was found to have non-obstructive disease.  We felt  his symptoms were related to microvascular angina.  He had been on  Cardizem for these symptoms and did not get too much relief, and we put  him on Ranexa 500 mg and then 1 gram twice daily.  He has not had any  angina since that time but his angina really occurred with two episodes  of more strenuous exertion, and he has not exerted himself that much  since then.   PAST MEDICAL HISTORY:  1. Significant for diabetes.  2. Hyperlipidemia.   CURRENT MEDICATIONS:  1. Glimepiride.  2. Aspirin.  3. Fish oil.  4. Vytorin.  5. Doxazosin.  6. Prilosec.  7. Metformin.  8. Metoprolol.  9. Ranexa 1 gram twice daily.   PHYSICAL EXAMINATION:  VITAL SIGNS:  Blood pressure 135/77, pulse 78 and  regular.  NECK:  There was no venous distention.  The carotid pulses are full  without bruits.  CHEST:  Clear.  HEART:  The cardiac rhythm was regular.  No murmurs or gallops.  ABDOMEN:  Soft with normal bowel sounds.  No hepatosplenomegaly.  EXTREMITIES:  Right femoral artery site was well-healed.  The peripheral  pulses are full.  There is no peripheral edema.   IMPRESSION:  1. Coronary artery disease, status post remote percutaneous      transluminal coronary angiography and remote directional      atherectomy in the left anterior descending coronary artery, with  non-obstructive disease at last cardiac catheterization in November      2008.  2. Microvascular angina.  3. Hyperlipidemia.  4. Diabetes.  5. Status post ablation for supraventricular tachycardia.  6. Normal left ventricular function.   RECOMMENDATIONS:  I think Mr. [name] [name] is doing well.  The Ranexa is quite  expensive, especially since he is in a donut hole, and it costs over  $300. for a one month's supply.  He has not had any symptoms, but he has  minimal symptoms, except when he exerts himself very strenuously, so we  plan to try him off of the Ranexa.  If he starts having angina with his  current activity level, then we will consider resumption.  He was on  Cardizem CD 180 mg, and did not get any definite benefit from this, but  it may be that this dose was too low, so this is still a possibility to  use in the future.  He is also concerned about  the expense of Vytorin,  and his low-density lipoprotein was quite low.  We may be able to get by  with simvastatin.  I will order a fasting lipid profile and then make a  decision if we think we can get the target low-density lipoprotein with  simvastatin.   FOLLOWUP:  I will plan to see him back in three months, or sooner if  needed.  He is to call if he has recurrent angina.     Bruce Elvera Lennox Juanda Chance, MD, [facility]  Electronically Signed    BRB/MedQ  DD: 05/24/2007  DT: 05/24/2007  Job #: [id number]

## 2010-10-31 NOTE — Discharge Summary (Signed)
NAMEORIAN, [name]                 ACCOUNT NO.:  [id number]   MEDICAL RECORD NO.:  [id number]          PATIENT TYPE:  INP   LOCATION:  5707                         FACILITY:  [facility]   PHYSICIAN:  Gita Kudo, M.D. DATE OF BIRTH:  Mar 27, 1941   DATE OF ADMISSION:  03/26/2005  DATE OF DISCHARGE:  03/28/2005                                 DISCHARGE SUMMARY   DISCHARGE DIAGNOSES:  1.  Cholecystitis and cholelithiasis status post laparoscopic      cholecystectomy on March 26, 2005 under the care of Dr. Lebron Conners.  2.  Diabetes mellitus, treated.  3.  Coronary artery disease.  4.  Hypertension.  5.  Hypercholesterolemia, treated.  6.  Nephrolithiasis.  7.  Rare alcohol use.   HOSPITAL COURSE:  [name] is a 70 year old male patient who presented to  the emergency room after one day of feeling bloated.  He then began having  severe abdominal pain in the right upper quadrant that awoke him from sleep.  This was associated with nausea, but no vomiting.  He presented to [facility] emergency room for further evaluation.  Ultrasound revealed  distended gallbladder with slight wall-thickening and pericholecystic fluid  consistent with acute cholecystitis.   The patient was then taken to the operating room on March 26, 2005 where a  laparoscopic cholecystectomy, tolerated the procedure well, taken to his  room in stable condition.  On postoperative day #1 he did have some right  upper quadrant pain and moderate distention of his abdomen.  A HIDA scan was  ordered.  There was no evidence for a bile leak.  The following day the  patient felt better, his abdomen was soft, and he was discharged to home.   He was discharged to home on Tylenol/Advil for pain.  He is to continue all  medications as prior to admission which included:  1.  Nexium.  2.  VYTORIN.  3.  Doxycin.  4.  Lisinopril.  5.  Aspirin.  6.  Ocuvite.  7.  Omega3.  8.  Folic acid.  9.  He is to  also continue his diabetic medications.   He is not to drive for one week.  He is to follow-up with Dr. Orson Slick in two  weeks, he is to call for that appointment.      Guy Franco, P.A.    ______________________________  Gita Kudo, M.D.    LB/MEDQ  D:  05/15/2005  T:  05/16/2005  Job:  [id number]   cc:   Lebron Conners, M.D.  [address]  [place]  [place] [address]

## 2010-10-31 NOTE — Assessment & Plan Note (Signed)
[facility]                            CARDIOLOGY OFFICE NOTE   NAME:Oneil, GOKU HARB                        MRN:          [id number]  DATE:06/28/2006                            DOB:          1940-09-10    CONSULTATION:   REASON FOR CONSULTATION:  Evaluation of chest pain.   PRIMARY CARE PHYSICIAN:  Donia Guiles, M.D.   CLINICAL HISTORY:  Mr. Murch is 70 years old and had previous PTCA and  subsequent directional atherectomy for restenosis of the left anterior  descending in the early 1990s.  He subsequently had an ablation  procedure for AV nodal reentrant tachycardia by Dr. Ladona Ridgel.   Over the last several months he has had exertional chest tightness with  mild shortness of breath.  He says he notices this when he hurries up a  [place].  He says if he eats breakfast and then hurries, it is somewhat  worse.  He has had no symptoms of rest pain.  He has had no associated  diaphoresis or nausea with these symptoms.   PAST MEDICAL HISTORY:  Significant for diabetes and hyperlipidemia.   CURRENT MEDICATIONS:  1. Toprol XL 100 mg daily.  2. Glimepiride 4 mg daily.  3. Aspirin 81 mg daily.  4. Folic acid 400 mcg daily.  5. Fish oil 1000 mg daily.  6. Ocuvite.  7. Vytorin 10/40 mg daily.  8. Doxazosin 2 mg daily.  9. Prilosec 20 mg daily.  10.Metformin 850 mg b.i.d.  11.He is allergic to PENICILLIN.   SOCIAL HISTORY:  He has his own landscaping business.  He quit smoking  in the late 1980s.  He does not drink alcohol.   FAMILY HISTORY:  Positive in that his mother died in her 67s of a heart  attack but his father lived to his 24s.  He has a brother who died at  age 63 of an abdominal aortic aneurysm.   REVIEW OF SYSTEMS:  Negative.   PHYSICAL EXAMINATION:  VITAL SIGNS:  On examination today, the blood  pressure is 122/77, pulse 76 and regular.  NECK:  There was no venous distention.  The carotid pulses were full and  there were no bruits.  CHEST:  Clear without rales or rhonchi.  CARDIAC:  The cardiac rhythm was regular.  The first and second heart  sounds were normal.  There were no murmurs or gallops.  ABDOMEN:  Soft with normal bowel sounds.  There was no  hepatosplenomegaly.  There were no pulsatile masses.  EXTREMITIES:  The peripheral pulses were full, and there was no  peripheral edema.  MUSCULOSKELETAL:  No deformities.  The skin was warm and dry.  NEUROLOGIC:  No focal neurologic signs.   An electrocardiogram was normal.   IMPRESSION:  1. Exertional chest tightness and mild shortness of breath strongly      suggestive of angina.  2. Coronary artery disease, status post remote percutaneous      transluminal coronary angioplasty and subsequent directional      atherectomy of the left anterior descending artery in 1994.  3. 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  4. Noninsulin-dependent diabetes.  5. Hyperlipidemia.   RECOMMENDATIONS:  Mr. Gorrell symptoms are fairly characteristic for  angina.  I think he needs further evaluation.  Since he has a high  probability of disease, I think the best thing would be to proceed with  cardiac catheterization.  He is agreeable for this approach, and we will  make arrangements for him to be done in the outpatient laboratory.  Will  hold his metformin the night prior to his procedure.  Since he has a  history of abdominal aortic aneurysm, we will plan to do a distal  aortogram at the time of this procedure.     Bruce Elvera Lennox Juanda Chance, MD, [facility]  Electronically Signed    BRB/MedQ  DD: 06/28/2006  DT: 06/29/2006  Job #: [id number]   cc:   Donia Guiles, M.D.

## 2010-10-31 NOTE — H&P (Signed)
[name], [name]                 ACCOUNT NO.:  [id number]   MEDICAL RECORD NO.:  [id number]          PATIENT TYPE:  EMS   LOCATION:  MAJO                         FACILITY:  [facility]   PHYSICIAN:  Wilmon Arms. Corliss Skains, M.D. DATE OF BIRTH:  [birth year]   DATE OF ADMISSION:  03/26/2005  DATE OF DISCHARGE:                                HISTORY & PHYSICAL   HISTORY OF PRESENT ILLNESS:  The patient is a 70 year old white male who has  felt constipated and bloated x1 day.  The patient was home asleep when he  awoke with severe abdominal pain around 1 a.m.  This pain was in his upper  abdomen, but localized mostly to the right.  He reports some nausea, but no  vomiting.  He had a normal bowel movement yesterday.  He denies any fever.  There is no radiation through to his back.   MEDICATIONS:  Nexium, Vytorin, Cardura, Amaryl, lisinopril, aspirin, Ocuvite  supplements, omega-3 supplements, folic acid supplements.   ALLERGIES:  PENICILLIN causes itching.   PAST MEDICAL HISTORY:  1.  Non-insulin-dependent diabetes.  2.  Coronary artery disease.  3.  Status post atherectomy.  4.  Hypertension.  5.  Hypercholesterolemia.  6.  History of kidney stones.   FAMILY HISTORY:  Family history is positive for macular degeneration.   PAST SURGICAL HISTORY:  1.  Coronary artery atherectomy and cardiac ablation for arrhythmia.  2.  Lithotripsy.   SOCIAL HISTORY:  Nonsmoker, occasional drinker of less than 1 time a week.   PHYSICAL EXAMINATION:  VITAL SIGNS:  The patient is afebrile, blood pressure  130/79, pulse 82.  GENERAL:  This is an overweight white male lying comfortably in no apparent  distress.  HEENT:  EOMI.  Sclerae anicteric.  LUNGS:  Clear to auscultation.  HEART:  Regular rate and rhythm.  ABDOMEN:  Soft, non-distended, very tender in his right upper quadrant.  There is a positive Murphy's sign.  No masses palpated.   LABORATORY DATA:  White count is 11.9, hemoglobin 15.5, platelet  count  214,000.  Electrolytes are within normal limits.  Total bilirubin is 1.3,  lipase is 24.   IMAGING STUDIES:  Ultrasound showed no visualized stones, but the  gallbladder was distended with slight wall thickening and a small amount of  pericholecystic fluid.   IMPRESSION:  1.  Acute cholecystitis.  2.  Type 2 diabetes.  3.  Cardiac history.   PLAN:  Admit for intravenous antibiotics and hydration.  The patient will  need cardiac clearance prior to proceeding with cholecystectomy.      Wilmon Arms. Tsuei, M.D.  Electronically Signed     MKT/MEDQ  D:  03/26/2005  T:  03/26/2005  Job:  [id number]

## 2010-10-31 NOTE — Assessment & Plan Note (Signed)
[facility]                               LIPID CLINIC NOTE   NAME:[name], [name]                        MRN:          [id number]  DATE:07/11/2008                            DOB:          1940-09-26    PRIMARY CARDIOLOGIST:  Everardo Beals. Juanda Chance, MD, [facility]   The patient was seen back in Lipid Clinic for the evaluation, medication  and titration associated with his hyperlipidemia and documented coronary  artery disease.  He has been walking for 3-5 days each week for several  hours each day in his neighborhood.  He had cut down on his sodas, diet  reviewed.  He has no chest pain, shortness of breath, weakness, fatigue,  or dark urine that he has noted since his last visit.   PAST MEDICAL HISTORY:  1. Hyperlipidemia.  2. CAD.  3. Status post angioplasty in his LAD.  4. Diabetes.  5. Normal EF and he is status post ablation for supraventricular      tachycardia.   CURRENT MEDICATIONS:  1. Metoprolol 200 mg in the morning and 100 mg in the evening.  2. Lisinopril 20 mg daily.  3. Simvastatin 40 mg daily.  4. Metformin 1000 mg twice a day with a target A1c of less than or      equal to 6 per Dr. Arvilla Market.  5. Amaryl 4 mg a day.  6. Aspirin 81 mg daily.  7. Folic acid 400 mcg daily.  8. Fish oil 1 g daily.  9. Multivitamin daily.  10.Doxazosin 2 mg daily.  11.Omeprazole 20 mg daily.   REVIEW OF SYSTEMS:  As stated in HPI and otherwise negative.   PHYSICAL EXAMINATION:  Weight today is 209 pounds, heart rate is 70 and  regular, and respiratory rate is 17.   LABORATORY DATA:  Reviewed.   The patient has tolerated and done well overall.  He has blood work  drawn at Dr. Roselie Skinner office that we are going to try to get hold of.  He is going to call and request that these be sent over.  He will follow  up with [place] in 6 months and continue on his simvastatin.  He will call if  questions or problems in the meantime and continue to walk and follow  low-fat, low-cholesterol, low-glucose concentrated diet.      Shelby Dubin, PharmD, BCPS, CPP  Electronically Signed      Rollene Rotunda, MD, [facility]  Electronically Signed   MP/MedQ  DD: 07/11/2008  DT: 07/12/2008  Job #: [id number]

## 2010-10-31 NOTE — Assessment & Plan Note (Signed)
[facility]                            CARDIOLOGY OFFICE NOTE   NAME:Corkery, GUINN DELAROSA                        MRN:          [id number]  DATE:09/06/2006                            DOB:          07-Dec-1940    PRIMARY CARE PHYSICIAN:  Dr. Donia Guiles.   CLINICAL HISTORY:  Mr. Girton is 70 years old and is retired from the  police force, now runs a Radio broadcast assistant.  He had previous coronary  angioplasty and directional atherectomy of the LAD a number of years  ago.  I recently saw him for exertional chest tightness, and we arranged  for him to have a cardiac catheterization which showed nonobstructive  disease.  We did a Myoview scan which showed no evidence of ischemia,  and he had no ST changes.  We were not certain but thought his symptoms  were typical enough that they might represent microvascular angina, and  we started him on Cardizem 180 today.  He thinks it has not totally  eliminated his symptoms, but he thinks it has helped.   His past medical history is significant for diabetes and hyperlipidemia.   CURRENT MEDICATIONS:  Glimepiride, aspirin, fish oil, folic acid,  Ocuvite, Vytorin, doxazosin, Prilosec, Metformin, Metoprolol, and  Cardizem CD 180.   PHYSICAL EXAMINATION:  VITAL SIGNS:  Blood pressure 140/69 and pulse 71  and regular.  NECK:  There was no venous distension.  The carotid pulses were free  without bruit.  CHEST:  Clear.  CARDIAC:  Regular.  No murmurs or gallops.  ABDOMEN:  Soft with normal bowel sounds.  There is no  hepatosplenomegaly.  EXTREMITIES:  Peripheral pulses are full, and there is no peripheral  edema.   IMPRESSION:  1. Chest pain consistent with microvascular angina.  2. Coronary artery disease, status post remote PTCA and DCA of the      left anterior descending with nonobstructive disease at recent      cath.  3. Normal left ventricular function.  4. Status post remote ablation for supraventricular  tachycardia.  5. Diabetes.  6. Hyperlipidemia.   RECOMMENDATIONS:  Mr. Luczak's symptoms are better on the Cardizem but not  completely resolved.  His symptoms are not limiting, and he is not too  concerned about them as long as they do not put him at risk for a heart  attack.  We will plan to extend his Cardizem, and I will see him back in  4 months and depending on his symptomatology will reassess things and  decide if  he needs to continue the Cardizem or not.  He had a recent lipid profile  with Dr. Arvilla Market which was good except for borderline low HDL.     Bruce Elvera Lennox Juanda Chance, MD, [facility]  Electronically Signed    BRB/MedQ  DD: 09/06/2006  DT: 09/06/2006  Job #: [id number]   cc:   Donia Guiles, M.D.

## 2010-10-31 NOTE — Procedures (Signed)
NAME:  [name], [name]                 ACCOUNT NO.:  [id number]   MEDICAL RECORD NO.:  [id number]          PATIENT TYPE:  OUT   LOCATION:  SLEEP CENTER                 FACILITY:  [facility]   PHYSICIAN:  Clinton D. Maple Hudson, M.D. DATE OF BIRTH:  [birth year]   DATE OF STUDY:  06/12/2005                              NOCTURNAL POLYSOMNOGRAM   REFERRING PHYSICIAN:  Dr. Donia Guiles.   DATE OF STUDY:  June 12, 2005.   INDICATION FOR STUDY:  Hypersomnia with sleep apnea.   EPWORTH SLEEPINESS SCORE:  17/24.   BMI:  30.   WEIGHT:  260 pounds.   HOME MEDICATIONS:  Toprol XL, Lisinopril, doxazosin, Vytorin, Nexium,  aspirin, glimepiride.   SLEEP ARCHITECTURE:  Total sleep time 421 minutes with sleep efficiency 92%.  Stage I was 19%, stage II 49%, stages III and IV 9%, REM 22% of total sleep  time. Sleep latency 9 minutes, REM latency 104 minutes, awake after sleep  onset 35 minutes, arousal index 30. Bedtime medications were Vytorin,  lisinopril, doxazosin and Nexium.   RESPIRATORY DATA:  Split study protocol:  Apnea/hypopnea index (AHI, RDI)  57.4 obstructive events per hour indicating severe obstructive sleep  apnea/hypopnea syndrome. This included 46 obstructive apneas and 85  hypopneas before C-PAP. Events were not positional. REM AHI 15.6. C-PAP was  successfully titrated to 16 CWP, AHI 0 per hour. A large Respironics comfort  full #2 mask was used with heated humidifier.   OXYGEN DATA:  Moderately loud snoring with oxygen desaturation to a nadir of  76% before C-PAP. After C-PAP control, saturation held 95-98% on room air.   CARDIAC DATA:  Sinus rhythm with PVCs.   MOVEMENT/PARASOMNIA:  A total of 92 limb jerks were reported of which 18  were associated with arousal or awakening for periodic limb movement with  arousal index of 2.6 per hour which is minimally increased.   IMPRESSION/RECOMMENDATIONS:  1.  Severe obstructive sleep apnea/hypopnea syndrome, AHI 57.4 per hour with   nonpositional events, moderately loud snoring and oxygen desaturation to      76%.  2.  Successful C-PAP titration to 16 CWP, AHI 0 per hour. A large      Respironics comfort full #2 mask was used with heated humidifier.  3.  Minimal periodic limb movement with arousal, 2.6 per hour.  4.  Frequent premature ventricular contractures.      Clinton D. Maple Hudson, M.D.  Diplomate, Biomedical engineer of Sleep Medicine  Electronically Signed     CDY/MEDQ  D:  06/21/2005 15:24:38  T:  06/22/2005 00:58:41  Job:  [id number]

## 2010-10-31 NOTE — Op Note (Signed)
NAME:  [name], [name]                           ACCOUNT NO.:  [id number]   MEDICAL RECORD NO.:  [id number]                   PATIENT TYPE:  AMB   LOCATION:  [facility]                                 FACILITY:  [facility]   PHYSICIAN:  Mark C. Vernie Ammons, M.D.               DATE OF BIRTH:  [birth year]   DATE OF PROCEDURE:  06/22/2003  DATE OF DISCHARGE:                                 OPERATIVE REPORT   PREOPERATIVE DIAGNOSIS:  Phimosis.   POSTOPERATIVE DIAGNOSIS:  Phimosis and lower abdominal condyloma.   PROCEDURE:  1. Circumcision.  2. Fulguration of condyloma (1 cm size).   SURGEON:  Mark C. Vernie Ammons, M.D.   ANESTHESIA:  Intravenous sedation plus local.   DRAINS:  None.   BLOOD LOSS:  None.   SPECIMENS:  None.   COMPLICATIONS:  None.   INDICATIONS:  The patient is a 70 year old, white male with diabetes that  has developed significant phimosis to the point where he can no longer  retract his foreskin at all.  It is causing spraying and splitting of his  urinary stream.  He desires circumcision, and my recommendation is  circumcision as the treatment for this condition.  He mentioned a lesion  just above the base of the penis that he wanted me to take a look at  intraoperatively.   DESCRIPTION OF OPERATION:  After informed consent, the patient brought to  the major OR, placed on table, administered intravenous sedation.  I  performed a dorsal penile block in the standard fashion using extra 0.5%  Marcaine and 1% lidocaine in a 50:50 mixture, both without epinephrine.  After allowing adequate time for anesthetic effect, a circumcising incision  was then made circumferentially just proximal to the glans, a second on the  shaft skin and the redundant skin excised.  When I pulled the foreskin back,  required a great deal of force, but I was able to get it back and cleaned  with Betadine.  No lesions under the foreskin were noted.   I then reapproximated the skin edges with running 3-0  chromic suture.  My  attention was then directed to the area at the base of the penis, where  approximately a 1 cm obviously condylomatous lesion was identified.  I  fulgurated this with the Bovie electrocautery and applied Neosporin to this.  Neosporin was also applied to the incision as well as 4 x 4s and Coban.  The  patient was awakened and taken to the recovery room in stable satisfactory  condition.  He tolerated the procedure well with no intraoperative  complications.   I will give him a prescription for Tylox #38 and 5 days of 500 mg Keflex.  He will follow up in my office in approximately 2 weeks.  Mark C. Vernie Ammons, M.D.    MCO/MEDQ  D:  06/22/2003  T:  06/22/2003  Job:  [id number]

## 2010-10-31 NOTE — Cardiovascular Report (Signed)
[name], [name]                 ACCOUNT NO.:  [id number]   MEDICAL RECORD NO.:  [id number]          PATIENT TYPE:  OIB   LOCATION:  1962                         FACILITY:  [facility]   PHYSICIAN:  Bruce R. Juanda Chance, MD, FACCDATE OF BIRTH:  1941-03-15   DATE OF PROCEDURE:  DATE OF DISCHARGE:  06/30/2006                            CARDIAC CATHETERIZATION   PRIMARY CARE PHYSICIAN:  Donia Guiles, M.D.   CLINICAL HISTORY:  [name] is 70 years old and has known coronary  artery disease.  He had PTCA and subsequent directional atherectomy of  the LAD in 1994.  He also has had radiofrequency ablation for AV nodal  reentrant supraventricular tachycardia.  He recently had developed  symptoms of exertional chest tightness and shortness of breath which  were very suggestive of angina.  They brought him in today for  evaluation with angiography in the outpatient laboratory.   PROCEDURE:  The procedure was followed by the femoral artery and  arterial sheath and 5-French pre-formed coronary catheters.  A front  wall arterial puncture was performed and Omnipaque contrast was used.  A  distal aortogram was performed to rule out abdominal aneurysm.  The  patient tolerated the procedure well and left the laboratory in  satisfactory condition.   RESULTS:  The left main coronary artery:  The left main coronary artery  had a 20% ostial stenosis.   The left anterior descending artery:  The left anterior descending  artery had moderately heavy calcification.  The LAD gave rise to a large  septal perforator and 2 diagonal branches.  There was less than 20%  narrowing at the [facility] site and the proximal LAD.  There are  irregularities throughout the LAD but no significant obstruction.   The circumflex artery subsequently gave rise to a ramus branch, a small  marginal branch, a large marginal branch and posterolateral branch.  This vessel also had moderate calcification.  There was 50-70% stenosis  in the  proximal circumflex artery.   The right coronary artery was a small to moderate-sized vessel, gave  rise to conus branch, three right ventricular branches, a posterior  descending branch and a small posterolateral branch.  There was moderate  calcification in the proximal vessel.  There was 70% narrowing in the  proximal vessel, and there was 40% narrowing in the mid-to-distal  vessel.   The left ventriculogram:  The left ventriculogram performed in the RAO  projection showed good wall motion with no areas of hypokinesis.  The  estimated ejection fraction was 60%.   A distal aortogram:  A distal aortogram was performed which showed  patent renal arteries and no significant aortoiliac obstruction.   The aortic pressure was 105/58 with a mean of 76, and the left  ventricular pressure was 105/16.   CONCLUSIONS:  Probable non-obstructive coronary artery disease with 20%  narrowing in the ostium of the left main coronary artery, less than 20%  narrowing at the directional atherectomy site in the proximal LAD, 50-  70% stenosis in the proximal circumflex artery, and 70% stenosis in the  proximal right coronary artery  with normal LV function.   RECOMMENDATIONS:  The patient has what appears to be non-obstructive  disease.  It is possible we could be underestimating some of the lesion  because of the calcification.  His symptoms sound fairly typical for  angina, so will plan to evaluate him further with a exercise rest stress  Myoview scan.  The other possibility is that he might have microvascular  angina accounting for his symptoms.      Bruce Elvera Lennox Juanda Chance, MD, [facility]  Electronically Signed     BRB/MEDQ  D:  06/30/2006  T:  06/30/2006  Job:  [id number]   cc:   Donia Guiles, M.D.

## 2010-10-31 NOTE — Assessment & Plan Note (Signed)
[facility]                            CARDIOLOGY OFFICE NOTE   NAME:Braithwaite, ETHER WOLTERS                        MRN:          [id number]  DATE:07/14/2006                            DOB:          09-Feb-1941    PRIMARY CARE PHYSICIAN:  Donia Guiles, M.D.   CLINICAL HISTORY:  [name] is 70 years old and has had previous  coronary angioplasty and subsequent directional coronary atherectomy to  the LAD in the past.  He recently developed exertional chest tightness  and was seen by me in consultation.  We arranged for him to have  catheterization, and this showed mostly nonobstructive disease.  The  directional atherectomy site had less than 20% narrowing, and there was  50-70% narrowing in the proximal circumflex artery and 70% narrowing in  the proximal right coronary artery.  He did have some calcification, and  we were concerned we might be underestimating the lesion, so we did a  Myoview scan.  He exercised 5-1/2 minutes and did not have any chest  pain or ECG changes but stopped due to fatigue.  There were no defects  in his scan.   He says he is feeling about the same.  He says when he does walk fast or  walk up a [place], he will get some chest tightness.  What he does is stop,  and then he can continue on without any restrictions.   PAST MEDICAL HISTORY:  Significant for:  1. Diabetes.  2. Hyperlipidemia.   CURRENT MEDICATIONS:  Glimepiride, aspirin, fish oil, Vytorin,  doxazosin, Prilosec, metformin, and metoprolol.   PHYSICAL EXAMINATION:  VITAL SIGNS: Blood pressure 130/74, pulse 78 and  regular.  NECK:  There was no venous distention.  The carotid pulses were full  without bruits.  CHEST: Clear.  CARDIOVASCULAR:  Rhythm was regular.  I could hear no murmurs or  gallops.  ABDOMEN:  Soft without hepatosplenomegaly.  EXTREMITIES: Right femoral artery site was well healed.  Pedal pulses  were equal, and there was trace peripheral edema.   IMPRESSION:  1. Exertional chest pain, possible microvascular angina.  2. Coronary artery disease status post remote percutaneous      intervention with nonobstructive disease at catheterization and      nonischemic Myoview scan.  3. Status post remote ablation for supraventricular tachycardia.  4. Diabetes.  5. Hyperlipidemia.   RECOMMENDATIONS:  Mr. Mccauley appears to have nonobstructive coronary  artery disease, and his Myoview scan does not show any ischemia.  Nevertheless, he does have exertional chest tightness.  He does not meet  the definition of microvascular angina because he did not have ECG  changes on his stress test, and he is was in no pain during that stress  test, but he still may have this.  We will give him a trial of Cardizem  CD 180 mg a day.  I told him to call Annice Pih in 2 weeks if his symptoms  are not better, and we may adjust the dose to 240 a day.  I will plan to  see him back in two  months, and we will decide if any further evaluation  or therapy is needed.     Bruce Elvera Lennox Juanda Chance, MD, [facility]  Electronically Signed    BRB/MedQ  DD: 07/14/2006  DT: 07/14/2006  Job #: [id number]   cc:   Donia Guiles, M.D.

## 2010-10-31 NOTE — Op Note (Signed)
[name], [name] NO.:  [id number]   MEDICAL RECORD NO.:  [id number]          PATIENT TYPE:  INP   LOCATION:  5707                         FACILITY:  [facility]   PHYSICIAN:  [name]., M.D.DATE OF BIRTH:  29-Dec-1940   DATE OF PROCEDURE:  03/26/2005  DATE OF DISCHARGE:                                 OPERATIVE REPORT   PREOPERATIVE DIAGNOSIS:  Cholelithiasis and acute cholecystitis.   POSTOPERATIVE DIAGNOSIS:  Cholelithiasis and acute cholecystitis.   OPERATION:  Laparoscopic cholecystectomy.   SURGEON:  Lebron Conners, M.D.   ASSISTANT:  Raechel Ache, M.D.   ANESTHESIA:  General.   PROCEDURE:  After the patient was monitored and had general anesthesia and  routine preparation and draping of the abdomen, I infiltrated local  anesthetic just above the umbilicus and made a 2.5 cm midline incision and  cut the fascia for about 2 cm in the midline and bluntly entered the  peritoneal cavity.  I had good access to the right upper quadrant.  I placed  a 0 Vicryl pursestring suture in the fascia and secured the Hasson cannula  and inflated the abdomen with carbon dioxide.  I then noted that the  gallbladder was inflamed and distended and was thick-walled when I put in  the laparoscope.  Under direct view I put in three more ports at the typical  locations and positioned the patient head-up, foot-down, and tilted to the  left.  I then decompressed the gallbladder with the suction aspirator and  grasped the fundus with a large ratcheted grasper and retracted it toward  the right shoulder.  I took down the adhesions using the cautery and blunt  dissection.  They were very extensive, but I was able to finally identify  the infundibulum of the gallbladder and pull it to the right.  I dissected  further and saw the cystic artery crossing the triangle of Calot and I  clipped it with three clips and divided it between the two closest to the  gallbladder.  I further  dissected the cystic duct until it narrowed down  nicely and I could clearly see the taper of the infundibulum into the cystic  duct.  I clipped the cystic duct with three clips and cut above the clips to  the gallbladder and then further dissected around the gallbladder and pulled  it further laterally.  The clips appeared to be secure and controlling the  cystic duct well.  It seemed to have good integrity at the site of the  clipping.  I then further dissected the gallbladder out of the fossa  utilizing the fossa and gaining hemostasis with the cautery.  Because there  was so much inflammation and probable necrosis of the gallbladder, there  were several holes in it, some stones spilled out.  I did my best to  retrieve all of the stones which spilled out.  After detaching the  gallbladder from the liver, I placed it in a plastic pouch and then  copiously irrigated the right upper quadrant and removed the irrigant.  Hemostasis was good.  The  clips on the cystic duct appeared secure.  The  sponge, needle and instrument counts were correct.  I removed the  gallbladder through the umbilical incision in a plastic pouch and then tied  the pursestring suture.  I removed the two  lateral ports under direct view and saw no bleeding from the abdominal wall.  I removed the epigastric port after allowing the carbon dioxide to escape.  I closed all skin incisions with intracuticular 4-0 Vicryl and Steri-Strips.  The patient tolerated the operation well.      [name]., M.D.  Electronically Signed     WB/MEDQ  D:  03/26/2005  T:  03/26/2005  Job:  [id number]

## 2010-12-30 ENCOUNTER — Encounter: Payer: Self-pay | Admitting: Cardiovascular Disease

## 2011-01-01 ENCOUNTER — Other Ambulatory Visit: Payer: Self-pay | Admitting: *Deleted

## 2011-01-01 MED ORDER — METOPROLOL TARTRATE 100 MG PO TABS: ORAL_TABLET | ORAL | Status: DC

## 2011-01-27 ENCOUNTER — Encounter: Payer: Self-pay | Admitting: Cardiovascular Disease

## 2011-01-27 ENCOUNTER — Ambulatory Visit (INDEPENDENT_AMBULATORY_CARE_PROVIDER_SITE_OTHER): Payer: Medicare Other | Admitting: Cardiovascular Disease

## 2011-01-27 VITALS — BP 142/80 | HR 66 | Ht 78.0 in | Wt 290.4 lb

## 2011-01-27 DIAGNOSIS — I2510 Atherosclerotic heart disease of native coronary artery without angina pectoris: Secondary | ICD-10-CM

## 2011-01-27 DIAGNOSIS — R5383 Other fatigue: Secondary | ICD-10-CM | POA: Insufficient documentation

## 2011-01-27 NOTE — Patient Instructions (Signed)
Your physician has requested that you have an exercise tolerance test. For further information please visit [email or web address] Please also follow instruction sheet, as given.  Your physician has requested that you have an echocardiogram. Echocardiography is a painless test that uses sound waves to create images of your heart. It provides your doctor with information about the size and shape of your heart and how well your heart's chambers and valves are working. This procedure takes approximately one hour. There are no restrictions for this procedure. 414.01

## 2011-01-27 NOTE — Assessment & Plan Note (Signed)
Recent fatigue, dizziness. Will get echo to assess LV function and arrange an exercise treadmill stress test to exclude ischemia.

## 2011-01-27 NOTE — Progress Notes (Signed)
History of Present Illness: 70 yo Rice with h/o CAD, DM, HLD, SVT  here today for cardiac follow up. He has been followed in the past by Dr. Juanda Chance.  He had a remote PTCA and DCA of the LAD. He underwent catheterization in 2000  because of anginal chest pain and was found to have nonobstructive CAD. 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  He couldn't afford the latter and later Cardizem was discontinued and he is currently only on metoprolol. He has a history of SVT ablation by Dr. Ladona Ridgel.   He says he's been doing quite well. He's had no chest pain,  shortness of breath or palpitations. He does note lack of energy. He also describes dizziness when walking. He has not had a syncopal episode. He is easily fatigued with minimal exertion. He has been taking all of his medications.     Past Medical History  Diagnosis Date  . Diabetes mellitus   . Hyperlipidemia   . Coronary artery disease   . SVT (supraventricular tachycardia)     s/p ablation per Dr. Ladona Ridgel    Past Surgical History  Procedure Date  . Kidney stone removal   . Cholecystectomy     Current Outpatient Prescriptions  Medication Sig Dispense Refill  . aspirin 81 MG tablet Take 81 mg by mouth daily.        Marland Kitchen doxazosin (CARDURA) 2 MG tablet Take 2 mg by mouth daily.        . fish oil-omega-3 fatty acids 1000 MG capsule Take 4 capsules by mouth daily.        . insulin detemir (LEVEMIR FLEXPEN) 100 UNIT/ML injection Inject 100 Units into the skin at bedtime.        Marland Kitchen lisinopril (PRINIVIL,ZESTRIL) 20 MG tablet Take 20 mg by mouth daily.        . metFORMIN (GLUCOPHAGE) 1000 MG tablet Take 1,000 mg by mouth 2 (two) times daily with a meal.       . metoprolol (LOPRESSOR) 100 MG tablet Take 2 tabs every morning and one tab every evening  90 tablet  5  . Multiple Vitamins-Minerals (CENTRUM SILVER ULTRA MENS PO) Take 1 tablet by mouth daily.        Marland Kitchen omeprazole (PRILOSEC) 20 MG capsule Take 20  mg by mouth daily.        . simvastatin (ZOCOR) 20 MG tablet Take 40 mg by mouth daily.         Allergies  Allergen Reactions  . Codeine   . Penicillins     History   Social History  . Marital Status: Married    Spouse Name: N/A    Number of Children: N/A  . Years of Education: N/A   Occupational History  . Not on file.   Social History Main Topics  . Smoking status: Former Smoker    Types: Cigarettes    Quit date: 06/15/1986  . Smokeless tobacco: Never Used  . Alcohol Use: Yes     occasional  . Drug Use: Not on file  . Sexually Active: Not on file   Other Topics Concern  . Not on file   Social History Narrative  . No narrative on file    No family history on file.  Review of Systems:  As stated in the HPI and otherwise negative.   BP 142/80  Pulse 66  Ht 6\' 6"  (1.981 m)  Wt 290 lb 6.4 oz (  131.725 kg)  BMI 33.[name] kg/m2  Physical Examination: General: Well developed, well nourished, NAD HEENT: OP clear, mucus membranes moist SKIN: warm, dry. No rashes. Neuro: No focal deficits Musculoskeletal: Muscle strength 5/5 all ext Psychiatric: Mood and affect normal Neck: No JVD, no carotid bruits, no thyromegaly, no lymphadenopathy. Lungs:Clear bilaterally, no wheezes, rhonci, crackles Cardiovascular: Regular rate and rhythm. No murmurs, gallops or rubs. Abdomen:Soft. Bowel sounds present. Non-tender.  Extremities: No lower extremity edema. Pulses are 2 + in the bilateral DP/PT.  EKG:NSR, rate 66 bpm.

## 2011-01-27 NOTE — Assessment & Plan Note (Signed)
See above

## 2011-02-05 ENCOUNTER — Other Ambulatory Visit: Payer: Self-pay | Admitting: *Deleted

## 2011-02-05 MED ORDER — SIMVASTATIN 40 MG PO TABS: 40.0000 mg | ORAL_TABLET | Freq: Every day | ORAL | Status: DC

## 2011-02-23 ENCOUNTER — Encounter: Payer: Self-pay | Admitting: Internal Medicine

## 2011-03-03 ENCOUNTER — Ambulatory Visit (INDEPENDENT_AMBULATORY_CARE_PROVIDER_SITE_OTHER): Payer: Medicare Other | Admitting: Cardiovascular Disease

## 2011-03-03 ENCOUNTER — Ambulatory Visit (HOSPITAL_COMMUNITY): Payer: Medicare Other | Attending: Cardiovascular Disease

## 2011-03-03 DIAGNOSIS — R5383 Other fatigue: Secondary | ICD-10-CM

## 2011-03-03 DIAGNOSIS — R000 Tachycardia, unspecified: Secondary | ICD-10-CM | POA: Insufficient documentation

## 2011-03-03 DIAGNOSIS — I517 Cardiomegaly: Secondary | ICD-10-CM

## 2011-03-03 DIAGNOSIS — E669 Obesity, unspecified: Secondary | ICD-10-CM | POA: Insufficient documentation

## 2011-03-03 DIAGNOSIS — I2510 Atherosclerotic heart disease of native coronary artery without angina pectoris: Secondary | ICD-10-CM | POA: Insufficient documentation

## 2011-03-03 DIAGNOSIS — E785 Hyperlipidemia, unspecified: Secondary | ICD-10-CM | POA: Insufficient documentation

## 2011-03-03 DIAGNOSIS — E119 Type 2 diabetes mellitus without complications: Secondary | ICD-10-CM | POA: Insufficient documentation

## 2011-03-03 DIAGNOSIS — R5381 Other malaise: Secondary | ICD-10-CM | POA: Insufficient documentation

## 2011-03-03 DIAGNOSIS — I059 Rheumatic mitral valve disease, unspecified: Secondary | ICD-10-CM | POA: Insufficient documentation

## 2011-03-03 HISTORY — PX: TRANSTHORACIC ECHOCARDIOGRAM: SHX275

## 2011-03-03 NOTE — Progress Notes (Signed)
Exercise Treadmill Test  Pre-Exercise Testing Evaluation Rhythm: normal sinus  Rate: 85   PR:  .16 QRS:  .09  QT:  .38 QTc: .45     Test  Exercise Tolerance Test Ordering : Verne Carrow MD Interpreting MD: Benito Mccreedy MD  Unique Test No: 1  Treadmill:  1  Indication for ETT: known ASHD  Contraindication to ETT: No   Stress Modality: exercise - treadmill  Cardiac Imaging Performed: non   Protocol: standard Bruce - maximal  Max BP:  221/75  Max MPHR (bpm):  151 85% MPR (bpm):  128  MPHR obtained (bpm):  133 % MPHR obtained:  88  Reached 85% MPHR (min:sec):  3:15 Total Exercise Time (min-sec):  4:00  Workload in METS:  5.8 Borg Scale: 15  Reason ETT Terminated:  dyspnea    ST Segment Analysis At Rest: normal ST segments - no evidence of significant ST depression With Exercise: no evidence of significant ST depression  Other Information Arrhythmia:  No Angina during ETT:  absent (0) Quality of ETT:  non-diagnostic  ETT Interpretation:  normal - no evidence of ischemia by ST analysis  Comments: Pt exercised for 4 minutes of the Bruce protocol. No exertional chest pressure or ischemic EKG changes. Test stopped because of dyspnea. Poor exercise tolerance.   Recommendations: No further ischemic workup at this time. Daily exercise.

## 2011-07-07 ENCOUNTER — Other Ambulatory Visit: Payer: Self-pay | Admitting: Dermatology

## 2011-07-17 ENCOUNTER — Other Ambulatory Visit: Payer: Self-pay | Admitting: Cardiovascular Disease

## 2011-07-26 ENCOUNTER — Encounter (HOSPITAL_BASED_OUTPATIENT_CLINIC_OR_DEPARTMENT_OTHER): Payer: Self-pay | Admitting: *Deleted

## 2011-07-26 ENCOUNTER — Emergency Department (INDEPENDENT_AMBULATORY_CARE_PROVIDER_SITE_OTHER): Payer: Medicare Other

## 2011-07-26 ENCOUNTER — Emergency Department (HOSPITAL_BASED_OUTPATIENT_CLINIC_OR_DEPARTMENT_OTHER)
Admission: EM | Admit: 2011-07-26 | Discharge: 2011-07-26 | Disposition: A | Discharge status: Home / Self Care | Payer: Medicare Other | Attending: Emergency Medicine | Admitting: Emergency Medicine

## 2011-07-26 DIAGNOSIS — I10 Essential (primary) hypertension: Secondary | ICD-10-CM | POA: Insufficient documentation

## 2011-07-26 DIAGNOSIS — Z79899 Other long term (current) drug therapy: Secondary | ICD-10-CM | POA: Insufficient documentation

## 2011-07-26 DIAGNOSIS — I2510 Atherosclerotic heart disease of native coronary artery without angina pectoris: Secondary | ICD-10-CM | POA: Insufficient documentation

## 2011-07-26 DIAGNOSIS — M79642 Pain in left hand: Secondary | ICD-10-CM

## 2011-07-26 DIAGNOSIS — E119 Type 2 diabetes mellitus without complications: Secondary | ICD-10-CM | POA: Insufficient documentation

## 2011-07-26 DIAGNOSIS — M79609 Pain in unspecified limb: Secondary | ICD-10-CM | POA: Insufficient documentation

## 2011-07-26 DIAGNOSIS — M949 Disorder of cartilage, unspecified: Secondary | ICD-10-CM

## 2011-07-26 DIAGNOSIS — E785 Hyperlipidemia, unspecified: Secondary | ICD-10-CM | POA: Insufficient documentation

## 2011-07-26 DIAGNOSIS — M899 Disorder of bone, unspecified: Secondary | ICD-10-CM

## 2011-07-26 HISTORY — DX: Essential (primary) hypertension: I10

## 2011-07-26 MED ORDER — HYDROMORPHONE HCL 2 MG PO TABS: 2.0000 mg | ORAL_TABLET | ORAL | Status: AC | PRN

## 2011-07-26 MED ORDER — HYDROMORPHONE HCL PF 2 MG/ML IJ SOLN
2.0000 mg | Freq: Once | INTRAMUSCULAR | Status: AC
  Administered 2011-07-26: 2 mg via INTRAMUSCULAR

## 2011-07-26 MED ORDER — HYDROMORPHONE HCL 2 MG PO TABS
2.0000 mg | ORAL_TABLET | ORAL | Status: DC
  Filled 2011-07-26: qty 1

## 2011-07-26 MED ORDER — HYDROMORPHONE HCL PF 2 MG/ML IJ SOLN
INTRAMUSCULAR | Status: AC
  Filled 2011-07-26: qty 1

## 2011-07-26 NOTE — ED Provider Notes (Signed)
History     CSN: [id number]  Arrival date & time 07/26/11  0840   First MD Initiated Contact with Patient 07/26/11 1010      Chief Complaint  Patient presents with  . Hand Pain    (Consider location/radiation/quality/duration/timing/severity/associated sxs/prior treatment) Patient is a 70 y.o. male presenting with hand pain. The history is provided by the patient.  Hand Pain  He had onset 2 days ago of pain and swelling in his left hand. There is pain with any movement of his thumb. He denies any trauma other than he had a nose operation 3 days ago and when he received local anesthetic he had gripped and a railing very tightly. He denies falling and denies any blunt trauma. Pain is moderately severe and he rates it 7/10 currently benign out of 10 at its worst. Pain is worse with movement and palpation. Nothing makes it better. He's tried taking acetaminophen without relief. He was given a prescription for hydrocodone following his nose surgery, but he cannot take it because of an allergy. He denies fever, chills, sweats. He denies any red streaks.  Past Medical History  Diagnosis Date  . Diabetes mellitus   . Hyperlipidemia   . Coronary artery disease   . SVT (supraventricular tachycardia)     s/p ablation per Dr. Ladona Ridgel  . Hypertension     Past Surgical History  Procedure Date  . Kidney stone removal   . Cholecystectomy     Family History  Problem Relation Age of Onset  . Heart attack Mother   . Heart attack Brother   .       History  Substance Use Topics  . Smoking status: Former Smoker    Types: Cigarettes    Quit date: 06/15/1986  . Smokeless tobacco: Never Used  . Alcohol Use: Yes     occasional      Review of Systems  All other systems reviewed and are negative.    Allergies  Codeine and Penicillins  Home Medications   Current Outpatient Rx  Name Route Sig Dispense Refill  . ASPIRIN 81 MG PO TABS Oral Take 81 mg by mouth daily.      Marland Kitchen  DOXAZOSIN MESYLATE 2 MG PO TABS Oral Take 2 mg by mouth daily.      . OMEGA-3 FATTY ACIDS 1000 MG PO CAPS Oral Take 4 capsules by mouth daily.      . INSULIN DETEMIR 100 UNIT/ML [place] SOLN Subcutaneous Inject 100 Units into the skin at bedtime.      Marland Kitchen LISINOPRIL 20 MG PO TABS Oral Take 20 mg by mouth daily.      Marland Kitchen METFORMIN HCL 1000 MG PO TABS Oral Take 1,000 mg by mouth 2 (two) times daily with a meal.     . METOPROLOL TARTRATE 100 MG PO TABS  TAKE 2 TABLETS BY MOUTH EVERY MORNING AND 1 TABLET EVERY EVENING 90 tablet 5  . CENTRUM SILVER ULTRA MENS PO Oral Take 1 tablet by mouth daily.      Marland Kitchen OMEPRAZOLE 20 MG PO CPDR Oral Take 20 mg by mouth daily.      Marland Kitchen SIMVASTATIN 40 MG PO TABS Oral Take 1 tablet (40 mg total) by mouth at bedtime. 60 tablet 6    BP 171/83  Pulse 71  Temp(Src) 98.2 F (36.8 C) (Oral)  SpO2 98%  Physical Exam  Nursing note and vitals reviewed.  71 year old male who is resting comfortably and in no acute distress. Vital signs  show moderate hypertension with blood pressure 171/83. Oxygen saturation is 98% which is normal. Head is normocephalic and atraumatic. PERRLA, EOMI. Neck is nontender and supple without adenopathy JVD. Lungs are clear without rales, wheezes, or rhonchi. Heart has regular rate and rhythm without murmur. Abdomen is soft, flat, nontender without masses or hepatosplenomegaly. Extremities: There is moderate swelling of the thenar eminence of the left hand with some faint bluish discoloration suggesting ecchymosis. There is no erythema or warmth. It is free tender to palpation with worst tenderness being over the base of the first metacarpal. There is no swelling or tenderness in the forearm or wrist. There are no lymphangitic streaks. There is no axillary or epitrochlear adenopathy. Swelling does not extend into the thumb. No other extremity abnormalities seen. Skin is otherwise warm and dry without rash. Neurologic: Mental status is normal, cranial nerves are  intact, there no focal motor or sensory deficits.  ED Course  Procedures (including critical care time)  Dg Hand Complete Left  07/26/2011  *RADIOLOGY REPORT*  Clinical Data: Pain, no known injury  LEFT HAND - COMPLETE 3+ VIEW  Comparison: None.  Findings: Three views of the left hand submitted.  There is no acute fracture or subluxation.  Mild degenerative changes distal interphalangeal joints.  Degenerative changes are noted first carpal metacarpal joint and radiocarpal joint.  Diffuse osteopenia is noted.  IMPRESSION: No acute fracture or subluxation.  Diffuse osteopenia. Degenerative changes as described above.  Original Report Authenticated By: Natasha Mead, M.D.   X-rays are negative for fracture. I still feel that, clinically, he most likely has an ecchymosis and hematoma of his left thenar eminence. He will be referred to hand surgery for followup. Is placed in a Velcro splint for comfort and given a prescription for Dilaudid tablets for pain control.   1. Hand pain, left       MDM  Swelling of the left thenar eminence suspicious for either fracture or hematoma. I do not see evidence of cellulitis or abscess at this point. X-ray has been ordered.        Dione Booze, MD 07/26/11 1102

## 2011-07-26 NOTE — ED Notes (Signed)
Patient c/o L hand swelling/pain that has grown worse since Friday, hurts to move thumb, no injury

## 2011-07-30 ENCOUNTER — Other Ambulatory Visit: Payer: Self-pay | Admitting: Dermatology

## 2011-09-21 ENCOUNTER — Ambulatory Visit
Admission: RE | Admit: 2011-09-21 | Discharge: 2011-09-21 | Disposition: A | Discharge status: Home / Self Care | Payer: Medicare Other | Source: Ambulatory Visit | Attending: Family Medicine | Admitting: Family Medicine

## 2011-09-21 ENCOUNTER — Other Ambulatory Visit: Payer: Self-pay | Admitting: Family Medicine

## 2011-09-21 DIAGNOSIS — E1140 Type 2 diabetes mellitus with diabetic neuropathy, unspecified: Secondary | ICD-10-CM

## 2011-10-29 ENCOUNTER — Other Ambulatory Visit: Payer: Self-pay | Admitting: Urology

## 2011-11-05 ENCOUNTER — Encounter (HOSPITAL_BASED_OUTPATIENT_CLINIC_OR_DEPARTMENT_OTHER): Payer: Self-pay | Admitting: *Deleted

## 2011-11-06 ENCOUNTER — Encounter (HOSPITAL_BASED_OUTPATIENT_CLINIC_OR_DEPARTMENT_OTHER): Payer: Self-pay | Admitting: *Deleted

## 2011-11-06 NOTE — Progress Notes (Signed)
NPO AFTER MN. ARRIVES AT 1015. NEEDS ISTAT. CURRENT EKG W/ CHART. WILLL TAKE LOPRESSOR AND PRILOSEC AM OF SURG. W/ SIP OF WATER.

## 2011-11-13 ENCOUNTER — Encounter (HOSPITAL_BASED_OUTPATIENT_CLINIC_OR_DEPARTMENT_OTHER): Payer: Self-pay | Admitting: Anesthesiology

## 2011-11-13 ENCOUNTER — Ambulatory Visit (HOSPITAL_COMMUNITY): Payer: Medicare Other

## 2011-11-13 ENCOUNTER — Encounter (HOSPITAL_BASED_OUTPATIENT_CLINIC_OR_DEPARTMENT_OTHER): Payer: Self-pay | Admitting: *Deleted

## 2011-11-13 ENCOUNTER — Encounter (HOSPITAL_BASED_OUTPATIENT_CLINIC_OR_DEPARTMENT_OTHER)
Admission: RE | Disposition: A | Discharge status: Home / Self Care | Payer: Self-pay | Source: Ambulatory Visit | Attending: Urology

## 2011-11-13 ENCOUNTER — Ambulatory Visit (HOSPITAL_BASED_OUTPATIENT_CLINIC_OR_DEPARTMENT_OTHER): Payer: Medicare Other | Admitting: Anesthesiology

## 2011-11-13 ENCOUNTER — Ambulatory Visit (HOSPITAL_BASED_OUTPATIENT_CLINIC_OR_DEPARTMENT_OTHER)
Admission: RE | Admit: 2011-11-13 | Discharge: 2011-11-13 | Disposition: A | Discharge status: Home / Self Care | Payer: Medicare Other | Source: Ambulatory Visit | Attending: Urology | Admitting: Urology

## 2011-11-13 DIAGNOSIS — E7800 Pure hypercholesterolemia, unspecified: Secondary | ICD-10-CM | POA: Insufficient documentation

## 2011-11-13 DIAGNOSIS — K219 Gastro-esophageal reflux disease without esophagitis: Secondary | ICD-10-CM | POA: Insufficient documentation

## 2011-11-13 DIAGNOSIS — E119 Type 2 diabetes mellitus without complications: Secondary | ICD-10-CM | POA: Insufficient documentation

## 2011-11-13 DIAGNOSIS — Z79899 Other long term (current) drug therapy: Secondary | ICD-10-CM | POA: Insufficient documentation

## 2011-11-13 DIAGNOSIS — N201 Calculus of ureter: Secondary | ICD-10-CM

## 2011-11-13 DIAGNOSIS — G4730 Sleep apnea, unspecified: Secondary | ICD-10-CM | POA: Insufficient documentation

## 2011-11-13 DIAGNOSIS — Z7901 Long term (current) use of anticoagulants: Secondary | ICD-10-CM | POA: Insufficient documentation

## 2011-11-13 HISTORY — PX: URETEROSCOPY: SHX842

## 2011-11-13 HISTORY — DX: Dependence on other enabling machines and devices: Z99.89

## 2011-11-13 HISTORY — DX: Calculus of kidney: N20.0

## 2011-11-13 HISTORY — DX: Gastro-esophageal reflux disease without esophagitis: K21.9

## 2011-11-13 HISTORY — DX: Calculus of ureter: N20.1

## 2011-11-13 HISTORY — DX: Obstructive sleep apnea (adult) (pediatric): G47.33

## 2011-11-13 LAB — GLUCOSE, CAPILLARY: Glucose-Capillary: 88 mg/dL (ref 70–99)

## 2011-11-13 LAB — POCT I-STAT 4, (NA,K, GLUC, HGB,HCT)
Glucose, Bld: 133 mg/dL — ABNORMAL HIGH (ref 70–99)
HCT: 36 % — ABNORMAL LOW (ref 39.0–52.0)
Hemoglobin: 12.2 g/dL — ABNORMAL LOW (ref 13.0–17.0)
Potassium: 4.9 mEq/L (ref 3.5–5.1)
Sodium: 142 mEq/L (ref 135–145)

## 2011-11-13 SURGERY — URETEROSCOPY
Anesthesia: General | Site: Ureter | Laterality: Right | Wound class: Clean Contaminated

## 2011-11-13 MED ORDER — FENTANYL CITRATE 0.05 MG/ML IJ SOLN: 25.0000 ug | INTRAMUSCULAR | Status: DC | PRN

## 2011-11-13 MED ORDER — CIPROFLOXACIN IN D5W 200 MG/100ML IV SOLN
200.0000 mg | INTRAVENOUS | Status: AC
  Administered 2011-11-13: 200 mg via INTRAVENOUS

## 2011-11-13 MED ORDER — FENTANYL CITRATE 0.05 MG/ML IJ SOLN
INTRAMUSCULAR | Status: DC | PRN
  Administered 2011-11-13: 100 ug via INTRAVENOUS

## 2011-11-13 MED ORDER — HYDROCODONE-ACETAMINOPHEN 10-325 MG PO TABS: 1.0000 | ORAL_TABLET | ORAL | Status: AC | PRN

## 2011-11-13 MED ORDER — IOHEXOL 350 MG/ML SOLN
INTRAVENOUS | Status: DC | PRN
  Administered 2011-11-13: 50 mL

## 2011-11-13 MED ORDER — PHENAZOPYRIDINE HCL 200 MG PO TABS: 200.0000 mg | ORAL_TABLET | Freq: Three times a day (TID) | ORAL | Status: AC | PRN

## 2011-11-13 MED ORDER — SODIUM CHLORIDE 0.9 % IR SOLN
Status: DC | PRN
  Administered 2011-11-13: 6000 mL/h

## 2011-11-13 MED ORDER — PROPOFOL 10 MG/ML IV EMUL
INTRAVENOUS | Status: DC | PRN
  Administered 2011-11-13: 200 mg via INTRAVENOUS

## 2011-11-13 MED ORDER — PHENAZOPYRIDINE HCL 200 MG PO TABS
200.0000 mg | ORAL_TABLET | Freq: Once | ORAL | Status: AC
  Administered 2011-11-13: 200 mg via ORAL

## 2011-11-13 MED ORDER — TAMSULOSIN HCL 0.4 MG PO CAPS
0.4000 mg | ORAL_CAPSULE | Freq: Once | ORAL | Status: AC
  Administered 2011-11-13: 0.4 mg via ORAL

## 2011-11-13 MED ORDER — EPHEDRINE SULFATE 50 MG/ML IJ SOLN
INTRAMUSCULAR | Status: DC | PRN
  Administered 2011-11-13 (×2): 5 mg via INTRAVENOUS

## 2011-11-13 MED ORDER — MIDAZOLAM HCL 5 MG/5ML IJ SOLN
INTRAMUSCULAR | Status: DC | PRN
  Administered 2011-11-13: 2 mg via INTRAVENOUS

## 2011-11-13 MED ORDER — LACTATED RINGERS IV SOLN: INTRAVENOUS | Status: DC

## 2011-11-13 MED ORDER — LIDOCAINE HCL (CARDIAC) 20 MG/ML IV SOLN
INTRAVENOUS | Status: DC | PRN
  Administered 2011-11-13: 80 mg via INTRAVENOUS

## 2011-11-13 MED ORDER — LACTATED RINGERS IV SOLN
INTRAVENOUS | Status: DC
  Administered 2011-11-13: 13:00:00 via INTRAVENOUS
  Administered 2011-11-13: 100 mL/h via INTRAVENOUS
  Administered 2011-11-13: 15:00:00 via INTRAVENOUS

## 2011-11-13 MED ORDER — LIDOCAINE HCL 2 % EX GEL
CUTANEOUS | Status: DC | PRN
  Administered 2011-11-13: 1

## 2011-11-13 SURGICAL SUPPLY — 44 items
ADAPTER CATH URET PLST 4-6FR (CATHETERS) ×1 IMPLANT
ADPR CATH URET STRL DISP 4-6FR (CATHETERS) ×2
BAG DRAIN URO-CYSTO SKYTR STRL (DRAIN) ×3 IMPLANT
BAG DRN UROCATH (DRAIN) ×2
BASKET LASER NITINOL 1.9FR (BASKET) IMPLANT
BASKET STNLS GEMINI 4WIRE 3FR (BASKET) IMPLANT
BASKET ZERO TIP NITINOL 2.4FR (BASKET) ×2 IMPLANT
BRUSH URET BIOPSY 3F (UROLOGICAL SUPPLIES) IMPLANT
BSKT STON RTRVL 120 1.9FR (BASKET)
BSKT STON RTRVL GEM 120X11 3FR (BASKET)
BSKT STON RTRVL ZERO TP 2.4FR (BASKET) ×4
CANISTER SUCT LVC 12 LTR MEDI- (MISCELLANEOUS) ×1 IMPLANT
CATH INTERMIT  6FR 70CM (CATHETERS) ×1 IMPLANT
CATH URET 5FR 28IN CONE TIP (BALLOONS)
CATH URET 5FR 70CM CONE TIP (BALLOONS) IMPLANT
CLOTH BEACON ORANGE TIMEOUT ST (SAFETY) ×3 IMPLANT
DRAPE CAMERA CLOSED 9X96 (DRAPES) ×3 IMPLANT
ELECT REM PT RETURN 9FT ADLT (ELECTROSURGICAL)
ELECTRODE REM PT RTRN 9FT ADLT (ELECTROSURGICAL) IMPLANT
GLOVE BIO SURGEON STRL SZ8 (GLOVE) ×3 IMPLANT
GLOVE BIOGEL PI IND STRL 6.5 (GLOVE) IMPLANT
GLOVE BIOGEL PI IND STRL 7.5 (GLOVE) IMPLANT
GLOVE BIOGEL PI INDICATOR 6.5 (GLOVE) ×2
GLOVE BIOGEL PI INDICATOR 7.5 (GLOVE) ×1
GLOVE SKINSENSE NS SZ7.5 (GLOVE) ×1
GLOVE SKINSENSE STRL SZ7.5 (GLOVE) IMPLANT
GOWN PREVENTION PLUS LG XLONG (DISPOSABLE) ×4 IMPLANT
GOWN STRL REIN XL XLG (GOWN DISPOSABLE) ×4 IMPLANT
GUIDEWIRE 0.038 PTFE COATED (WIRE) IMPLANT
GUIDEWIRE ANG ZIPWIRE 038X150 (WIRE) IMPLANT
GUIDEWIRE STR DUAL SENSOR (WIRE) ×3 IMPLANT
IV NS IRRIG 3000ML ARTHROMATIC (IV SOLUTION) ×6 IMPLANT
KIT BALLIN UROMAX 15FX10 (LABEL) IMPLANT
KIT BALLN UROMAX 15FX4 (MISCELLANEOUS) IMPLANT
KIT BALLN UROMAX 26 75X4 (MISCELLANEOUS)
LASER FIBER DISP (UROLOGICAL SUPPLIES) ×1 IMPLANT
NS IRRIG 500ML POUR BTL (IV SOLUTION) ×1 IMPLANT
PACK CYSTOSCOPY (CUSTOM PROCEDURE TRAY) ×3 IMPLANT
SET HIGH PRES BAL DIL (LABEL)
SHEATH URET ACCESS 12FR/35CM (UROLOGICAL SUPPLIES) ×1 IMPLANT
SHEATH URET ACCESS 12FR/55CM (UROLOGICAL SUPPLIES) IMPLANT
STENT PERCUFLEX 4.8FRX26 (STENTS) ×1 IMPLANT
SYRINGE IRR TOOMEY STRL 70CC (SYRINGE) ×1 IMPLANT
WATER STERILE IRR 3000ML UROMA (IV SOLUTION) IMPLANT

## 2011-11-13 NOTE — Progress Notes (Signed)
Dr. Vernie Ammons paged.  Order clarification obtained re: asa, vit e, fish oil.  Pt instructed to begin these meds when urine is completely clear.  Pt and wife verbalized their understanding.

## 2011-11-13 NOTE — Anesthesia Postprocedure Evaluation (Signed)
  Anesthesia Post-op Note  Patient: [name]  Procedure(s) Performed: Procedure(s) (LRB): URETEROSCOPY (Right) HOLMIUM LASER APPLICATION (N/A)  Patient Location: PACU  Anesthesia Type: General  Level of Consciousness: awake and alert   Airway and Oxygen Therapy: Patient Spontanous Breathing  Post-op Pain: mild  Post-op Assessment: Post-op Vital signs reviewed, Patient's Cardiovascular Status Stable, Respiratory Function Stable, Patent Airway and No signs of Nausea or vomiting  Post-op Vital Signs: stable  Complications: No apparent anesthesia complications

## 2011-11-13 NOTE — Op Note (Signed)
PATIENT:  [name]  PRE-OPERATIVE DIAGNOSIS:  right Distal Ureteral calculus  POST-OPERATIVE DIAGNOSIS: 1. Right distal ureteral calculus. 2. Multiple right mid ureteral calculi.  PROCEDURE:  1. Cystoscopy with right retrograde pyelogram including interpretation. 2. Right ureteroscopy with laser lithotripsy of a distal right ureteral stone. 3. Right ureteroscopy, laser lithotripsy and ureteroscopic extraction of newly diagnosed, separately located right mid ureteral calculi. 4. Right double-J stent placement (with string)  SURGEON: Garnett Farm, MD  INDICATION: [name] is a 71 year old male patient who has had multiple calculi in the past. He developed right flank pain and was found by CT scan to have a stone in his distal right ureter. He was placed on medical expulsive therapy but failed to pass his stone. The stone could not be visualized on KUB. A followup CT scan however revealed the stone still present in the distal right ureter. We therefore discussed ureteroscopy and laser lithotripsy and he has elected to proceed with that.  ANESTHESIA:  General  EBL:  Minimal  DRAINS: 4.8 Fr., 26 cm stent in Rt. ureter  SPECIMEN:  Stone fragments  DISPOSITION OF SPECIMEN:  given to patient  DESCRIPTION OF PROCEDURE: The patient was taken to the major OR and placed on the table. General anesthesia was administered and then the patient was moved to the dorsal lithotomy position. The genitalia was sterilely prepped and draped. An official timeout was performed.  Initially the 22 French cystoscope with 12 lens was passed under direct vision down the urethra which is noted to be normal.The prostatic urethra revealed bilobar hypertrophy. No prostatic lesions were identified. The bladder was then entered and fully inspected. It was noted be free of any tumors stones or inflammatory lesions. Ureteral orifices were of normal configuration and position. A 6 French open-ended ureteral catheter  was then passed through the cystoscope into the ureteral orifice in order to perform a right retrograde pyelogram.  A retrograde pyelogram was performed by injecting full-strength contrast up the right ureter under direct fluoroscopic control. It revealed a filling defect in the distal lureter consistent with the stone seen on the preoperative KUB. The remainder of the ureter was noted to be normal as was the intrarenal collecting system. I then passed a 0.038 inch floppy-tipped guidewire through the open ended catheter and into the area of the renal pelvis and this was left in place. The inner portion of a ureteral access sheath was then passed over the guidewire to gently dilate the intramural ureter. I then proceeded with ureteroscopy.  A 6 French rigid ureteroscope was then passed under direct into the bladder and into the right orifice and up the ureter. The stone was identified and I felt it was too large to extract and therefore elected to proceed with laser lithotripsy. The 200  holmium laser fiber was used to fragment the stone. I then used the nitinol basket to extract all of the stone fragments and reinspection of the ureter ureteroscopically revealed no further stone fragments and no injury to the ureter in that location. I then advanced the scope further upper ureter to be sure there were no further fragments that had migrated up the ureter. I found for more stones that were not present at the time of his CT scan in his mid right ureter.  I therefore elected to treat all of the stones and was able to use a nitinol basket to extract one of the stones without the need for laser lithotripsy although the other stones were  too large to extract and therefore required fragmentation into smaller pieces which then were extracted using a nitinol basket. This required a great deal of time due to the size and number of the stones. I was eventually able to completely fragment and remove all of the stones from  his mid ureter as well. Reinspection of the ureter revealed it was intact throughout its length with no remaining stone fragments. The guidewire had been left in place. I therefore removed the ureteroscope.   I therefore backloaded the cystoscope over the guidewire and passed the stent over the guidewire into the area of the renal pelvis. As the guidewire was removed good curl was noted in the renal pelvis. The bladder was drained and the cystoscope was then removed.10 cc of 2% lidocaine jelly was then instilled in the urethra and a penile clamp was applied. The tether on the distal aspect of the stent was affixed to the dorsum of the penis with tape. The patient tolerated the procedure well no intraoperative complications.  PLAN OF CARE: Discharge to home after PACU  PATIENT DISPOSITION:  PACU - hemodynamically stable.

## 2011-11-13 NOTE — Anesthesia Preprocedure Evaluation (Addendum)
Anesthesia Evaluation  Patient identified by MRN, date of birth, ID band Patient awake    Reviewed: Allergy & Precautions, H&P , NPO status , Patient's Chart, lab work & pertinent test results, reviewed documented beta blocker date and time   Airway Mallampati: III TM Distance: >3 FB Neck ROM: full    Dental No notable dental hx. (+) Teeth Intact and Dental Advisory Given   Pulmonary sleep apnea and Continuous Positive Airway Pressure Ventilation ,  breath sounds clear to auscultation  Pulmonary exam normal       Cardiovascular Exercise Tolerance: Good hypertension, + CAD + dysrhythmias Supra Ventricular Tachycardia Rhythm:regular Rate:Normal  Ablation for SVT.  Last cardiology visit 8/12   Neuro/Psych negative neurological ROS  negative psych ROS   GI/Hepatic negative GI ROS, Neg liver ROS, GERD-  Medicated and Controlled,  Endo/Other  Diabetes mellitus-, Well Controlled, Type 2, Oral Hypoglycemic AgentsMorbid obesity  Renal/GU negative Renal ROS  negative genitourinary   Musculoskeletal   Abdominal (+) + obese,   Peds  Hematology negative hematology ROS (+)   Anesthesia Other Findings   Reproductive/Obstetrics negative OB ROS                          Anesthesia Physical Anesthesia Plan  ASA: III  Anesthesia Plan: General   Post-op Pain Management:    Induction: Intravenous  Airway Management Planned: LMA  Additional Equipment:   Intra-op Plan:   Post-operative Plan:   Informed Consent: I have reviewed the patients History and Physical, chart, labs and discussed the procedure including the risks, benefits and alternatives for the proposed anesthesia with the patient or authorized representative who has indicated his/her understanding and acceptance.   Dental Advisory Given  Plan Discussed with: CRNA and Surgeon  Anesthesia Plan Comments:         Anesthesia Quick  Evaluation

## 2011-11-13 NOTE — Transfer of Care (Signed)
Immediate Anesthesia Transfer of Care Note  Patient: [name]  Procedure(s) Performed: Procedure(s) (LRB): URETEROSCOPY (Right) HOLMIUM LASER APPLICATION (N/A)  Patient Location: PACU  Anesthesia Type: General  Level of Consciousness: sedated  Airway & Oxygen Therapy: Patient Spontanous Breathing and Patient connected to face mask oxygen  Post-op Assessment: Report given to PACU RN and Post -op Vital signs reviewed and stable  Post vital signs: Reviewed and stable  Complications: No apparent anesthesia complications

## 2011-11-13 NOTE — H&P (Signed)
History of Present Illness     Nephrolithiasis: He had his first stone in 10/82 that required basketing from the left ureter. In 1/97 he underwent lithotripsy of a left ureteral stone. A CT scan done in 9/08 revealed bilateral nephrolithiasis. He required ureteroscopy and laser lithotripsy of a right ureteral stone in 4/12.  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  Phimosis: Due to his diabetes he developed phimosis and in 1/05 underwent circumcision.   Interval history: He developed right flank pain and a KUB revealed a calcification in the area of the distal ureter that was not seen on previous KUBs or CT scans. He was placed on medical expulsive therapy empirically and given pain medication. He continues to have dull aching on the right-hand side with intermittent moderately severe pain. He has not seen a stone pass. He has not seen any gross hematuria.   Past Medical History Problems  1. History of  Adult Sleep Apnea 780.57 2. History of  Diabetes Mellitus 250.00 3. History of  Esophageal Reflux 530.81 4. History of  Heart Disease 429.9 5. History of  Hypercholesterolemia 272.0 6. History of  Ureteral Stone Left 592.1 7. History of  Ureteral Stone Right 592.1  Surgical History Problems  1. History of  Cystoscopy With Insertion Of Ureteral Stent Right 2. History of  Cystoscopy With Removal Of Ureteral Calculus Left Left 3. History of  Cystoscopy With Ureteroscopy With Lithotripsy 4. History of  Elective Circumcision V50.2 5. History of  Lithotripsy Left  Current Meds 1. Aspirin 81 MG Oral Tablet; Therapy: (Recorded:12Apr2012) to 2. Diltiazem HCl 60 MG Oral Tablet; Therapy: (Recorded:22May2012) to 3. Doxazosin Mesylate 4 MG Oral Tablet; Therapy: (Recorded:22May2012) to 4. Fish Oil Concentrate 1000 MG Oral Capsule; Therapy: (Recorded:22May2012) to 5. Glimepiride 4 MG Oral Tablet; Therapy:  (Recorded:12Apr2012) to 6. Hydrocodone-Acetaminophen 10-325 MG Oral Tablet; TAKE 1-2 TABLETS EVERY 6 HOURS AS  NEEDED; Therapy: 02May2013 to (Complete:08May2013); Last Rx:02May2013 7. Indomethacin 50 MG Oral Capsule; Therapy: 04Jan2012 to 8. Levemir FlexPen 100 UNIT/ML Subcutaneous Solution; Therapy: 15Mar2012 to 9. Lisinopril 10 MG Oral Tablet; Therapy: (Recorded:22May2012) to 10. MetFORMIN HCl 1000 MG Oral Tablet; Therapy: (Recorded:22May2012) to 11. Metoprolol Tartrate 50 MG Oral Tablet; Therapy: (Recorded:22May2012) to 12. Omeprazole 20 MG Oral Capsule Delayed Release; Therapy: (Recorded:22May2012) to 13. Simvastatin 40 MG Oral Tablet; Therapy: (Recorded:22May2012) to 14. Tamsulosin HCl 0.4 MG Oral Capsule; Take one tablet daily at bedtime; Therapy: 02May2013 to   (Evaluate:01Jun2013)  Requested for: 02May2013; Last Rx:02May2013  Allergies Medication  1. Codeine Derivatives 2. Penicillins  Family History Problems  1. Paternal history of  Death In The Family Father father passed @ age 57old age 43. Maternal history of  Death In The Family Mother mother passed @ age 56heart attack 3. Fraternal history of  Diabetes Mellitus V18.0 4. Sororal history of  Diabetes Mellitus V18.0 5. Family history of  Family Health Status Number Of Children 2 sons3 daughters 6. Maternal history of  Heart Disease V17.49  Social History Problems  1. Marital History - Currently Married 2. Occupation: self employed 3. History of  Tobacco Use V15.82 smoked one pack dailysmoked for 15 yearsquit smoking 20 years ago Denied  4. History of  Alcohol Use 5. History of  Caffeine Use   Review of Systems Genitourinary, constitutional, skin, eye, otolaryngeal, hematologic/lymphatic, cardiovascular, pulmonary, endocrine, musculoskeletal, gastrointestinal, neurological and psychiatric system(s) were reviewed and pertinent findings if present are noted.  Genitourinary: urinary frequency, feelings of urinary  urgency, dysuria, nocturia, incontinence, incomplete emptying of bladder, foul-smelling urine and suprapubic pain.    Physical Exam Constitutional: Well nourished and well developed . No acute distress.  ENT:. The ears and nose are normal in appearance.  Neck: The appearance of the neck is normal and no neck mass is present.  Pulmonary: No respiratory distress and normal respiratory rhythm and effort.  Cardiovascular:. No peripheral edema.  Skin: Normal skin turgor, no visible rash and no visible skin lesions.  Neuro/Psych:. Mood and affect are appropriate.   Assessment Assessed  1. Distal Ureteral Stone On The Right 592.1 2. Nephrolithiasis Of Both Kidneys 592.0      I could not see his stone on his KUB although he certainly had symptoms suggesting the presence of a stone. This is despite the fact that his urine was clear. Because of these factors I recommended further evaluation with a CT scan. His CT scan reveals that he has right hydronephrosis with an 8 x 7 mm stone in the right ureter just above the lower border of the SI joint region. It has Hounsfield units of 1100. It cannot be visualized on KUB and therefore ESL is not an option. I therefore have discussed ureteroscopic treatment of this stone with laser lithotripsy. Gone over the procedure with him in detail including its risks and complications as well as the alternatives. He understands and has elected to proceed.   Plan    He is scheduled for right ureteroscopy with laser lithotripsy of his right ureteral stone.

## 2011-11-13 NOTE — Discharge Instructions (Signed)
Post stone removal/stent placement surgery instructions ° ° °Definitions: ° °Ureter: The duct that transports urine from the kidney to the bladder. °Stent: A plastic hollow tube that is placed into the ureter, from the kidney to the bladder to prevent the ureter from swelling shut. ° °General instructions: ° °Despite the fact that no skin incisions were used, the area around the ureter and bladder is raw and irritated. The stent is a foreign body which will further irritate the bladder wall. This irritation is manifested by increased frequency of urination, both day and night, and by an increase in the urge to urinate. In some, the urge to urinate is present almost always. Sometimes the urge is strong enough that you may not be able to stop your self from urinating. The only real cure is to remove the stent and then give time for the bladder wall to heal which can't be done until the danger of the ureter swelling shut has passed. (This varies from 2-21 days). ° °You may see some blood in your urine while the stent is in place and a few days afterward. Do not be alarmed, even if the urine is clear for a while. Get off your feet and drink lots of fluids until clearing occurs. If you start to pass clots or don't improve, call us. ° °If you have a string coming from your urethra:  The stent string is attached to your ureteral stent.  Do not pull on thisIf you have a string coming from your urethra:  The stent string is attached to your ureteral stent.  Do not pull on this. ° °Diet: ° °You may return to your normal diet immediately. Because of the raw surface of your bladder, alcohol, spicy foods, foods high in acid and drinks with caffeine may cause irritation or frequency and should be used in moderation. To keep your urine flowing freely and avoid constipation, drink plenty of fluids during the day (8-10 glasses). Tip: Avoid cranberry juice because it is very acidic. ° °Activity: ° °Your physical activity doesn't need  to be restricted. However, if you are very active, you may see some blood in the urine. We suggest that you reduce your activity under the circumstances until the bleeding has stopped. ° °Bowels: ° °It is important to keep your bowels regular during the postoperative period. Straining with bowel movements can cause bleeding. A bowel movement every other day is reasonable. Use a mild laxative if needed, such as milk of magnesia 2-3 tablespoons, or 2 Dulcolax tablets. Call if you continue to have problems. If you had been taking narcotics for pain, before, during or after your surgery, you may be constipated. Take a laxative if necessary. ° ° ° ° °Medication: ° °You should resume your pre-surgery medications unless told not to. DO NOT RESUME YOUR ASPIRIN, or any other medicines like ibuprofen, motrin, excedrin, advil, aleve, vitamin E, fish oil as these can all cause bleeding x 7 days. In addition you may be given an antibiotic to prevent or treat infection. Antibiotics are not always necessary. All medication should be taken as prescribed until the bottles are finished unless you are having an unusual reaction to one of the drugs. ° °Problems you should report to us: ° °a. Fever greater than 101°F. °b. Heavy bleeding, or clots (see notes above about blood in urine). °c. Inability to urinate. °d. Drug reactions (hives, rash, nausea, vomiting, diarrhea). °e. Severe burning or pain with urination that is not improving. ° °Followup: ° °  You will need a followup appointment to monitor your progress in most cases. Please call the office for this appointment when you get home if your appointment has not already been scheduled. Usually the first appointment will be about 5-14 days after your surgery and if you have a stent in place it will likely be removed at that time. ° °Post Anesthesia Home Care Instructions ° °Activity: °Get plenty of rest for the remainder of the day. A responsible adult should stay with you for 24  hours following the procedure.  °For the next 24 hours, DO NOT: °-Drive a car °-Operate machinery °-Drink alcoholic beverages °-Take any medication unless instructed by your physician °-Make any legal decisions or sign important papers. ° °Meals: °Start with liquid foods such as gelatin or soup. Progress to regular foods as tolerated. Avoid greasy, spicy, heavy foods. If nausea and/or vomiting occur, drink only clear liquids until the nausea and/or vomiting subsides. Call your physician if vomiting continues. ° °Special Instructions/Symptoms: °Your throat may feel dry or sore from the anesthesia or the breathing tube placed in your throat during surgery. If this causes discomfort, gargle with warm salt water. The discomfort should disappear within 24 hours. ° ° °

## 2011-11-13 NOTE — Anesthesia Procedure Notes (Signed)
Procedure Name: LMA Insertion Date/Time: 11/13/2011 12:38 PM Performed by: Burna Cash Pre-anesthesia Checklist: Patient identified, Emergency Drugs available, Suction available and Patient being monitored Patient Re-evaluated:Patient Re-evaluated prior to inductionOxygen Delivery Method: Circle System Utilized Preoxygenation: Pre-oxygenation with 100% oxygen Intubation Type: IV induction Ventilation: Mask ventilation without difficulty LMA: LMA inserted LMA Size: 5.0 Number of attempts: 1 Airway Equipment and Method: bite block Placement Confirmation: positive ETCO2 Tube secured with: Tape Dental Injury: Teeth and Oropharynx as per pre-operative assessment

## 2011-11-16 ENCOUNTER — Encounter (HOSPITAL_BASED_OUTPATIENT_CLINIC_OR_DEPARTMENT_OTHER): Payer: Self-pay | Admitting: Urology

## 2011-11-19 ENCOUNTER — Encounter (HOSPITAL_BASED_OUTPATIENT_CLINIC_OR_DEPARTMENT_OTHER): Payer: Self-pay

## 2011-11-21 ENCOUNTER — Observation Stay (HOSPITAL_BASED_OUTPATIENT_CLINIC_OR_DEPARTMENT_OTHER)
Admission: EM | Admit: 2011-11-21 | Discharge: 2011-11-22 | Disposition: A | Discharge status: Home / Self Care | Payer: Medicare Other | Attending: Emergency Medicine | Admitting: Emergency Medicine

## 2011-11-21 ENCOUNTER — Encounter (HOSPITAL_BASED_OUTPATIENT_CLINIC_OR_DEPARTMENT_OTHER): Payer: Self-pay | Admitting: *Deleted

## 2011-11-21 ENCOUNTER — Emergency Department (HOSPITAL_BASED_OUTPATIENT_CLINIC_OR_DEPARTMENT_OTHER): Payer: Medicare Other

## 2011-11-21 DIAGNOSIS — I2510 Atherosclerotic heart disease of native coronary artery without angina pectoris: Secondary | ICD-10-CM | POA: Insufficient documentation

## 2011-11-21 DIAGNOSIS — R42 Dizziness and giddiness: Secondary | ICD-10-CM | POA: Insufficient documentation

## 2011-11-21 DIAGNOSIS — I10 Essential (primary) hypertension: Secondary | ICD-10-CM | POA: Insufficient documentation

## 2011-11-21 DIAGNOSIS — E785 Hyperlipidemia, unspecified: Secondary | ICD-10-CM | POA: Insufficient documentation

## 2011-11-21 DIAGNOSIS — Z87442 Personal history of urinary calculi: Secondary | ICD-10-CM | POA: Insufficient documentation

## 2011-11-21 DIAGNOSIS — E860 Dehydration: Principal | ICD-10-CM | POA: Insufficient documentation

## 2011-11-21 DIAGNOSIS — L0889 Other specified local infections of the skin and subcutaneous tissue: Secondary | ICD-10-CM | POA: Insufficient documentation

## 2011-11-21 DIAGNOSIS — E119 Type 2 diabetes mellitus without complications: Secondary | ICD-10-CM | POA: Insufficient documentation

## 2011-11-21 DIAGNOSIS — K219 Gastro-esophageal reflux disease without esophagitis: Secondary | ICD-10-CM | POA: Insufficient documentation

## 2011-11-21 DIAGNOSIS — L0390 Cellulitis, unspecified: Secondary | ICD-10-CM

## 2011-11-21 DIAGNOSIS — N289 Disorder of kidney and ureter, unspecified: Secondary | ICD-10-CM | POA: Insufficient documentation

## 2011-11-21 LAB — BASIC METABOLIC PANEL
BUN: 29 mg/dL — ABNORMAL HIGH (ref 6–23)
CO2: 21 mEq/L (ref 19–32)
CO2: 22 mEq/L (ref 19–32)
Calcium: 8.8 mg/dL (ref 8.4–10.5)
Chloride: 104 mEq/L (ref 96–112)
Glucose, Bld: 240 mg/dL — ABNORMAL HIGH (ref 70–99)
Glucose, Bld: 153 mg/dL — ABNORMAL HIGH (ref 70–99)
Potassium: 4.4 mEq/L (ref 3.5–5.1)
Potassium: 4.4 mEq/L (ref 3.5–5.1)
Sodium: 138 mEq/L (ref 135–145)
Sodium: 141 mEq/L (ref 135–145)

## 2011-11-21 LAB — DIFFERENTIAL
Eosinophils Absolute: 0.2 10*3/uL (ref 0.0–0.7)
Lymphocytes Relative: 30 % (ref 12–46)
Lymphs Abs: 3 10*3/uL (ref 0.7–4.0)
Monocytes Relative: 12 % (ref 3–12)
Neutro Abs: 5.5 10*3/uL (ref 1.7–7.7)
Neutrophils Relative %: 56 % (ref 43–77)

## 2011-11-21 LAB — URINALYSIS, ROUTINE W REFLEX MICROSCOPIC
Bilirubin Urine: NEGATIVE
Nitrite: NEGATIVE
Specific Gravity, Urine: 1.014 (ref 1.005–1.030)
pH: 5.5 (ref 5.0–8.0)

## 2011-11-21 LAB — URINE MICROSCOPIC-ADD ON

## 2011-11-21 LAB — CBC
Hemoglobin: 13.5 g/dL (ref 13.0–17.0)
MCH: 34.9 pg — ABNORMAL HIGH (ref 26.0–34.0)
Platelets: 229 10*3/uL (ref 150–400)
RBC: 3.87 MIL/uL — ABNORMAL LOW (ref 4.22–5.81)
WBC: 9.9 10*3/uL (ref 4.0–10.5)

## 2011-11-21 LAB — TROPONIN I
Troponin I: <0.3 ng/mL (ref <0.30)
Troponin I: <0.3 ng/mL (ref <0.30)

## 2011-11-21 MED ORDER — ONDANSETRON HCL 4 MG/2ML IJ SOLN: 4.0000 mg | Freq: Four times a day (QID) | INTRAMUSCULAR | Status: DC | PRN

## 2011-11-21 MED ORDER — ACETAMINOPHEN 325 MG PO TABS: 650.0000 mg | ORAL_TABLET | ORAL | Status: DC | PRN

## 2011-11-21 MED ORDER — PANTOPRAZOLE SODIUM 40 MG PO TBEC
40.0000 mg | DELAYED_RELEASE_TABLET | Freq: Every day | ORAL | Status: DC
  Administered 2011-11-21: 40 mg via ORAL

## 2011-11-21 MED ORDER — LISINOPRIL 10 MG PO TABS
20.0000 mg | ORAL_TABLET | Freq: Every day | ORAL | Status: DC
  Administered 2011-11-21: 20 mg via ORAL

## 2011-11-21 MED ORDER — ZOLPIDEM TARTRATE 5 MG PO TABS
5.0000 mg | ORAL_TABLET | Freq: Every evening | ORAL | Status: DC | PRN
  Filled 2011-11-21: qty 1

## 2011-11-21 MED ORDER — SODIUM CHLORIDE 0.9 % IV SOLN: 20.0000 mL | INTRAVENOUS | Status: DC

## 2011-11-21 MED ORDER — HYDROCODONE-ACETAMINOPHEN 10-325 MG PO TABS
1.0000 | ORAL_TABLET | ORAL | Status: DC | PRN
  Filled 2011-11-21: qty 2

## 2011-11-21 MED ORDER — INSULIN DETEMIR 100 UNIT/ML ~~LOC~~ SOLN
65.0000 [IU] | Freq: Every day | SUBCUTANEOUS | Status: DC
  Administered 2011-11-21: 65 [IU] via SUBCUTANEOUS
  Filled 2011-11-21: qty 10

## 2011-11-21 MED ORDER — ASPIRIN 81 MG PO TABS: 81.0000 mg | ORAL_TABLET | Freq: Every day | ORAL | Status: DC

## 2011-11-21 MED ORDER — DOXAZOSIN MESYLATE 2 MG PO TABS
2.0000 mg | ORAL_TABLET | Freq: Every day | ORAL | Status: DC
  Administered 2011-11-21: 2 mg via ORAL
  Filled 2011-11-21: qty 1

## 2011-11-21 MED ORDER — SULFAMETHOXAZOLE-TMP DS 800-160 MG PO TABS
1.0000 | ORAL_TABLET | Freq: Two times a day (BID) | ORAL | Status: DC
  Administered 2011-11-21: 1 via ORAL
  Filled 2011-11-21: qty 1

## 2011-11-21 MED ORDER — SODIUM CHLORIDE 0.9 % IV BOLUS (SEPSIS)
1000.0000 mL | Freq: Once | INTRAVENOUS | Status: AC
  Administered 2011-11-21: 1000 mL via INTRAVENOUS

## 2011-11-21 MED ORDER — OMEGA-3 FATTY ACIDS 1000 MG PO CAPS
4.0000 | ORAL_CAPSULE | Freq: Every day | ORAL | Status: DC
  Administered 2011-11-21: 4 g via ORAL

## 2011-11-21 MED ORDER — SIMVASTATIN 40 MG PO TABS
40.0000 mg | ORAL_TABLET | Freq: Every day | ORAL | Status: DC
  Administered 2011-11-21: 40 mg via ORAL
  Filled 2011-11-21: qty 1

## 2011-11-21 NOTE — ED Notes (Signed)
Pt states he was seen at [facility] on Thursday and taken off of his BP meds "Because his kidneys were not working right" Since then, has had episodes of ?orthostatic hypotension. Also feel jittery at times.

## 2011-11-21 NOTE — ED Provider Notes (Signed)
History  This chart was scribed for Cyndra Numbers, MD by Cherlynn Perches. The patient was seen in room MH05/MH05. Patient's care was started at 1503.  CSN: [id number]  Arrival date & time 11/21/11  1503   First MD Initiated Contact with Patient 11/21/11 1529      Chief Complaint  Patient presents with  . Dizziness    (Consider location/radiation/quality/duration/timing/severity/associated sxs/prior treatment) HPI  [name] is a 71 y.o. male with a h/o diabetes, kidney stones, HTN, hyperlipidemia, CAD, and GERD who presents to the Emergency Department complaining of 2 days of sudden onset, gradually worsening, moderate dizziness with associated lightheadedness, numbness localized to the fingers, shakes, and increased urination. Pt reports that he had a large kidney stone removed 8 days ago. Pt then had a check-up with his PCP (Dr. Clelia Croft at [facility]) 2 days ago. Pt was taken off his blood pressure medication (Metformin and Metoprolol) after the appointment due to "kidney problems." Pt reports that symptoms began shortly after being taken off of medication. Pt states that symptoms are significantly improved by lying down. Pt denies chest pain, SOB, and vision disturbances. Pt is a former smoker and uses alcohol occasionally.  He is currently symptom-free while sitting at rest.   Past Medical History  Diagnosis Date  . Diabetes mellitus   . Hyperlipidemia   . SVT (supraventricular tachycardia)     s/p ablation per Dr. Ladona Ridgel  . Hypertension   . OSA on CPAP   . S/P ablation of ventricular arrhythmia   . GERD (gastroesophageal reflux disease)   . History of kidney stones   . Coronary artery disease CARDIOLOGIST - DR Verne Carrow-  LAST VISIT 01-27-2011  IN [facility]  . Right ureteral stone   . Bilateral kidney stones     Past Surgical History  Procedure Date  . Right ureteroscopic stone extraction 09-26-2010  . Laparoscopic cholecystectomy 03-26-2005  . Circumcision and fulgeration  of condyloma 06-22-2003  . Cardiac electrophysiology study and ablation 2002  . Transthoracic echocardiogram 03-03-2011    MODERATE LVH/ LVSF NORMAL / EF 60-65%/ MILDLY DILATED LEFT ATRIUMK  . Coronary angioplasty 1994    DCA OF THE LAD  . Cardiac catheterization 04-27-2007    CAD WITH 30% NARROWING IN THE MID-LAD/ 50 % NARROWING PROXIMAL CIRCUMFLEX/ 40% NARROWING SECOND MARGINAL BRANCH/ 40-50% PROXIMAL RCA WITH DISTAL POSTERIOR NARROWING/ NORMAL LVF  . Cardiac catheterization 2000    NON-OBSTRUCTIVE CAD  . Severeal ureteroscopic stone extractions   . Ureteroscopy 11/13/2011    Procedure: URETEROSCOPY;  Surgeon: Garnett Farm, MD;  Location: [facility];  Service: Urology;  Laterality: Right;  RIGHT URETEROSCOPY WITH HOLMIUM LASER LIHTOTRIPSY DIGITAL URETEROSCOPE    Family History  Problem Relation Age of Onset  . Heart attack Mother   . Heart attack Brother   .       History  Substance Use Topics  . Smoking status: Former Smoker    Types: Cigarettes    Quit date: 06/15/1986  . Smokeless tobacco: Never Used  . Alcohol Use: Yes     occasional      Review of Systems  Constitutional: Negative.  Negative for fever and chills.  HENT: Negative.   Eyes: Negative.   Respiratory: Negative.  Negative for cough and shortness of breath.   Cardiovascular: Negative for chest pain.  Gastrointestinal: Negative.  Negative for nausea, vomiting, abdominal pain and diarrhea.  Genitourinary: Positive for frequency. Negative for dysuria and hematuria.  Musculoskeletal: Negative.  Negative for  back pain.  Skin: Negative.  Negative for rash.  Neurological: Positive for dizziness, tremors, light-headedness and numbness. Negative for syncope.  Hematological: Negative.   Psychiatric/Behavioral: Negative.  Negative for confusion. The patient is not nervous/anxious.   All other systems reviewed and are negative.    Allergies  Codeine and Penicillins  Home Medications    Current Outpatient Rx  Name Route Sig Dispense Refill  . ASPIRIN 81 MG PO TABS Oral Take 81 mg by mouth daily.     Marland Kitchen DOXAZOSIN MESYLATE 2 MG PO TABS Oral Take 2 mg by mouth daily.     . OMEGA-3 FATTY ACIDS 1000 MG PO CAPS Oral Take 4 capsules by mouth daily.     Marland Kitchen HYDROCODONE-ACETAMINOPHEN 10-325 MG PO TABS Oral Take 1-2 tablets by mouth every 4 (four) hours as needed for pain. 30 tablet 0  . INSULIN DETEMIR 100 UNIT/ML [place] SOLN Subcutaneous Inject 100 Units into the skin at bedtime.     Marland Kitchen LISINOPRIL 20 MG PO TABS Oral Take 20 mg by mouth daily.     Marland Kitchen METFORMIN HCL 1000 MG PO TABS Oral Take 1,000 mg by mouth 2 (two) times daily with a meal.     . METOPROLOL TARTRATE 100 MG PO TABS  TAKE 2 TABLETS BY MOUTH EVERY MORNING AND 1 TABLET EVERY EVENING 90 tablet 5  . CENTRUM SILVER ULTRA MENS PO Oral Take 1 tablet by mouth daily.     Marland Kitchen OMEPRAZOLE 20 MG PO CPDR Oral Take 20 mg by mouth every morning.     Marland Kitchen SIMVASTATIN 40 MG PO TABS Oral Take 1 tablet (40 mg total) by mouth at bedtime. 60 tablet 6    Triage Vitals: BP 118/69  Pulse 81  Temp(Src) 97.7 F (36.5 C) (Oral)  Resp 18  Ht 6\' 5"  (1.956 m)  Wt 267 lb (121.11 kg)  BMI 31.66 kg/m2  SpO2 98%  Physical Exam  Nursing note and vitals reviewed. Constitutional: He is oriented to person, place, and time. He appears well-developed and well-nourished.  HENT:  Head: Normocephalic and atraumatic.  Eyes: Conjunctivae are normal. No scleral icterus.  Neck: Neck supple. No thyromegaly present.  Cardiovascular: Normal rate and regular rhythm.  Exam reveals no gallop and no friction rub.   No murmur heard. Pulmonary/Chest: Effort normal. No stridor. No respiratory distress.  Abdominal: Soft. There is no tenderness.  Musculoskeletal: Normal range of motion. He exhibits no edema.  Lymphadenopathy:    He has no cervical adenopathy.  Neurological: He is alert and oriented to person, place, and time. He exhibits normal muscle tone. Coordination  normal.       No neurological deficit  Skin: Skin is warm and dry.  Psychiatric: He has a normal mood and affect. His behavior is normal.    ED Course  Procedures (including critical care time)   Date: 11/21/2011  Rate: 86  Rhythm: normal sinus rhythm  QRS Axis: normal  Intervals: normal  ST/T Wave abnormalities: normal  Conduction Disutrbances: none  Narrative Interpretation: unremarkable     DIAGNOSTIC STUDIES: Oxygen Saturation is 98% on room air, normal by my interpretation.    COORDINATION OF CARE: 3:43PM - Patient and Family understand and agree with initial ED impression and plan with expectations set for ED visit. 5:18PM - Pt feeling better after IV fluids. Increase in pt's creatinine to     Results for orders placed during the hospital encounter of 11/21/11  GLUCOSE, CAPILLARY      Component Value  Range   Glucose-Capillary 228 (*) 70 - 99 (mg/dL)  URINALYSIS, ROUTINE W REFLEX MICROSCOPIC      Component Value Range   Color, Urine YELLOW  YELLOW    APPearance CLEAR  CLEAR    Specific Gravity, Urine 1.014  1.005 - 1.030    pH 5.5  5.0 - 8.0    Glucose, UA NEGATIVE  NEGATIVE (mg/dL)   Hgb urine dipstick LARGE (*) NEGATIVE    Bilirubin Urine NEGATIVE  NEGATIVE    Ketones, ur NEGATIVE  NEGATIVE (mg/dL)   Protein, ur NEGATIVE  NEGATIVE (mg/dL)   Urobilinogen, UA 0.2  0.0 - 1.0 (mg/dL)   Nitrite NEGATIVE  NEGATIVE    Leukocytes, UA SMALL (*) NEGATIVE   CBC      Component Value Range   WBC 9.9  4.0 - 10.5 (K/uL)   RBC 3.87 (*) 4.22 - 5.81 (MIL/uL)   Hemoglobin 13.5  13.0 - 17.0 (g/dL)   HCT 16.1 (*) 09.6 - 52.0 (%)   MCV 95.9  78.0 - 100.0 (fL)   MCH 34.9 (*) 26.0 - 34.0 (pg)   MCHC 36.4 (*) 30.0 - 36.0 (g/dL)   RDW 04.5  40.9 - 81.1 (%)   Platelets 229  150 - 400 (K/uL)  DIFFERENTIAL      Component Value Range   Neutrophils Relative 56  43 - 77 (%)   Neutro Abs 5.5  1.7 - 7.7 (K/uL)   Lymphocytes Relative 30  12 - 46 (%)   Lymphs Abs 3.0  0.7 - 4.0  (K/uL)   Monocytes Relative 12  3 - 12 (%)   Monocytes Absolute 1.2 (*) 0.1 - 1.0 (K/uL)   Eosinophils Relative 2  0 - 5 (%)   Eosinophils Absolute 0.2  0.0 - 0.7 (K/uL)   Basophils Relative 1  0 - 1 (%)   Basophils Absolute 0.1  0.0 - 0.1 (K/uL)  BASIC METABOLIC PANEL      Component Value Range   Sodium 138  135 - 145 (mEq/L)   Potassium 4.4  3.5 - 5.1 (mEq/L)   Chloride 104  96 - 112 (mEq/L)   CO2 21  19 - 32 (mEq/L)   Glucose, Bld 240 (*) 70 - 99 (mg/dL)   BUN 29 (*) 6 - 23 (mg/dL)   Creatinine, Ser 9.14 (*) 0.50 - 1.35 (mg/dL)   Calcium 9.4  8.4 - 78.2 (mg/dL)   GFR calc non Af Amer 36 (*) >90 (mL/min)   GFR calc Af Amer 42 (*) >90 (mL/min)  TROPONIN I      Component Value Range   Troponin I <0.30  <0.30 (ng/mL)  URINE MICROSCOPIC-ADD ON      Component Value Range   Squamous Epithelial / LPF RARE  RARE    WBC, UA 7-10  <3 (WBC/hpf)   RBC / HPF 7-10  <3 (RBC/hpf)   Bacteria, UA MANY (*) RARE    Casts HYALINE CASTS (*) NEGATIVE    Urine-Other MUCOUS PRESENT     Dg Chest 2 View  11/21/2011  *RADIOLOGY REPORT*  Clinical Data: Dizziness  CHEST - 2 VIEW  Comparison: Chest radiograph 03/26/2005  Findings: Mild cardiomegaly is stable.  Mediastinal hilar contours are within normal limits and stable.  There is mild chronic elevation of the right hemidiaphragm.  Pulmonary vascularity is normal.  The lungs are clear.  No acute bony abnormality.  IMPRESSION: Stable mild cardiomegaly.  No acute cardiopulmonary disease.  Original Report Authenticated By: Britta Mccreedy, M.D.  Ct Head Wo Contrast  11/21/2011  *RADIOLOGY REPORT*  Clinical Data: Dizziness  CT HEAD WITHOUT CONTRAST  Technique:  Contiguous axial images were obtained from the base of the skull through the vertex without contrast.  Comparison: None.  Findings: No skull fracture is noted.  Paranasal sinuses and mastoid air cells are unremarkable.  No intracranial hemorrhage, mass effect or midline shift.  Mild cerebral atrophy.  No  acute infarction.  No mass lesion is noted on this unenhanced scan.  IMPRESSION: No acute intracranial abnormality.  Mild cerebral atrophy.  Original Report Authenticated By: Natasha Mead, M.D.     1. Dehydration, moderate       MDM  Patient was evaluated by myself. Based on initial presentation patient did have workup for dizziness. This included the possibility of evaluation for CVA. CT of the head was performed and was negative. Patient was asymptomatic. Patient also had workup for possible contribution of cardiac disease given his history of CAD. Had 2 negative troponins as well as an unchanged EKG. Chest x-ray as well as CBC were unremarkable. Patient had acute renal insufficiency with creatinine of 1.8 with elevated BUN on renal panel. Potassium was within normal limits. Patient had improvement to grade of 1.6 on 2 L of normal saline IV bolus. Urinalysis showed 7-10 red blood cells in 7-10 white blood cells. Urine culture was sent. Patient is currently being treated with Bactrim by his urologist who removed his stents from his kidneys last week. Patient has no back pain, no nausea, no vomiting at this time. He is hemodynamically stable. Patient was feeling much better after 2 liters of normal saline.  Patient's symptoms are likely secondary to recent dehydration and urologic intervention. Patient has hard he had his metformin discontinued. Patient will be admitted to dehydration protocol and continue IV hydration overnight. BMP will be repeated in the morning. I anticipate patient will have as the patient improvement in renal function by the morning to no longer found the category of acute renal insufficiency. Patient was hemodynamically stable. Home meds were ordered and dehydration protocol orders were placed by myself.      I personally performed the services described in this documentation, which was scribed in my presence. The recorded information has been reviewed and considered.       Cyndra Numbers, MD 11/21/11 2306

## 2011-11-21 NOTE — ED Provider Notes (Signed)
CDU Dehydration  CV: RRR, no m/r/g Pulm: CTAB, no c/w/r  Patient admitted to dehydration protocol for continued IV fluids given acute renal insufficiency. He is asymptomatic at this time. Patient will be reassessed in the morning with repeat metabolic panel.    Cyndra Numbers, MD 11/21/11 [phone]

## 2011-11-22 LAB — BASIC METABOLIC PANEL
BUN: 21 mg/dL (ref 6–23)
CO2: 22 mEq/L (ref 19–32)
Chloride: 111 mEq/L (ref 96–112)
Creatinine, Ser: 1.3 mg/dL (ref 0.50–1.35)
GFR calc Af Amer: 63 mL/min — ABNORMAL LOW (ref >90)
Potassium: 4.4 mEq/L (ref 3.5–5.1)

## 2011-11-22 LAB — GLUCOSE, CAPILLARY: Glucose-Capillary: 174 mg/dL — ABNORMAL HIGH (ref 70–99)

## 2011-11-22 MED ORDER — CEPHALEXIN 250 MG PO CAPS
500.0000 mg | ORAL_CAPSULE | Freq: Once | ORAL | Status: AC
  Administered 2011-11-22: 500 mg via ORAL
  Filled 2011-11-22: qty 2

## 2011-11-22 MED ORDER — CEPHALEXIN 500 MG PO CAPS: 500.0000 mg | ORAL_CAPSULE | Freq: Four times a day (QID) | ORAL | Status: AC

## 2011-11-22 NOTE — Discharge Instructions (Signed)
Kidney Failure In kidney failure, the kidneys lose their ability to filter enough waste products from the blood. They also lose the ability to regulate the body's balance of salt and water. Eventually, the kidneys slow their production of urine or stop producing it completely. Waste products and water gather in the body. This can lead to a life-threatening overload of fluids (such as heart failure). It can also lead to a dangerous buildup of waste products in the blood. These extreme changes in blood chemistry can affect the function of the heart and brain.  TYPES OF KIDNEY FAILURE Acute kidney failure. In this form of kidney failure, the kidneys stop working properly because of a sudden illness, a medicine, or a medical condition that causes one of the following:   A severe drop in blood pressure or an interruption in the normal blood flow to the kidneys. This can occur during:   Major surgery.   Severe burns with fluid loss.   Massive bleeding.   A heart attack that severely affects heart function.   Blood clots that travel to the kidney.   Direct damage to kidney cells or to the kidneys' filtering units. This can be caused by:   An inflammation of the kidneys.   Toxic chemicals.   Medicines or infections.   Blocked urine flow from the kidney. This can occur because of obstructions outside the kidney, such as:   Kidney stones.   Bladder tumors.   An enlarged prostate.  Blockage of urine flow within the kidney can also cause sudden kidney failure, as can occur with large muscle injuries.  Chronic kidney failure. In this form of kidney failure, the kidney gradually loses function. This happens over a period of years. It is a slow and gradual loss of the ability of the kidneys to send out wastes, concentrate urine, and conserve the salts in your blood. Some of the causes of chronic kidney failure are:  Diabetes (very common cause).   Polycystic kidney disease.    Glomerulonephritis.   Alport syndrome.   The flow of urine out of the kidney is blocked (obstructive uropathy).   High blood pressure (very common).   Long-term exposure to lead, mercury, and other chemicals and medicines.   Kidney stones with infection.   Reflux nephropathy.   Pain medicine overuse.  Some forms of chronic kidney failure run in families. Your caregiver will ask you about family medical problems.  End-stage kidney disease (ESKD). This is also called end-stage kidney failure. In ESKD, kidney function worsens until the person dies. This is usually the result of longstanding chronic kidney failure, but sometimes it follows acute kidney failure. SYMPTOMS  Symptoms vary depending on the type of kidney failure.   Acute kidney failure. Symptoms include:   Swelling (edema) resulting from salt and water overload.   High blood pressure.   Vomiting.   Tiredness (lethargy) caused by the toxic effects of waste products on brain function.   Feeling sick to your stomach (nauseous).   Decreased urine output.   Chronic kidney failure and ERSD. Because the kidney damage in chronic kidney failure occurs slowly over a long time, symptoms develop slowly. Symptoms can include:   Headache.   Weakness.   Itching.   Vomiting.   Pale skin.   Slowing of growth in children.   Fatigue.   Tiredness (lethargy).   Poor appetite.   Increased thirst.   High blood pressure.   Bone damage in adults.  DIAGNOSIS  If you  have an illness or medical condition that increases the risk of acute kidney failure, your caregivers will watch you closely. You may have blood and urine tests that measure the function of your kidneys. If you have a medical condition that increases the risk of long-term kidney damage, your caregiver will check your blood pressure and look for symptoms of chronic kidney failure during rechecks. TREATMENT  Treatment depends on the type of kidney failure.    Acute kidney failure. Treatment begins with measures to correct the cause of kidney failure (shock, hemorrhage, burns, heart attack). After this has begun, more specific kidney treatment may include:   Fluids given through the vein (intravenously) to correct any abnormal fluid loss.   Medicines called diuretics that increase urine output.   Limited fluids by mouth.   A diet low in protein and high in carbohydrates.   Medicines to adjust high or low levels of blood chemicals, such as potassium and medicines to control high blood pressure.   Short-term dialysis may be necessary if the patient develops severe high blood pressure, severe fluid overload, heart failure, symptoms of altered brain function, or severe abnormalities in blood chemistry.   Chronic kidney failure. People with chronic kidney failure are watched closely. They receive frequent physical exams, blood pressure checks, and blood testing. Treatment includes:   A low-protein and low-salt diet.   Medicines to adjust blood chemical levels.   Medicines to treat high blood pressure.   Sometimes, a hormonal medicine called erythropoietin is given to correct a low level of red blood cells (anemia).   ESKD. Treatment includes:   Dialysis until a donor can be found for a kidney transplant. Dialysis mechanically removes waste products from the blood.   Both kidneys may need to be removed surgically before a transplant in patients with severe high blood pressure or chronic pyelonephritis.  PROGNOSIS   Acute kidney failure may go away on its own. Some people recover within a matter of days. Exactly how long the illness lasts varies greatly from person-to-person. The duration depends on the cause of the kidney problem. In rare cases, acute kidney failure progresses to ESKD. Among people who recover, about 50% have some permanent kidney damage. In most cases, this is not severe enough to prevent you from living a normal life.    Chronic kidney failure is a lifelong problem that can worsen over time to become ESKD. Not everyone develops ESKD. For those who do, the time it takes for ESKD to develop varies from person-to-person.   ESKD is a permanent condition that can be treated only with dialysis or a kidney transplant.  PREVENTION  Many forms of kidney failure cannot be prevented. People who have diabetes, high blood pressure, or coronary artery disease should try to control the illness with:  Appropriate diet.   Medicine.   Lifestyle changes.  If you have chronic kidney failure, you should tell all caregivers who treat you.  HOME CARE INSTRUCTIONS   Follow your diet and take your medicines as instructed.   Do not use any new medicines (prescription, over-the-counter, or nutritional supplements) unless approved by your caregiver. Many medicines can worsen your kidney damage or need to have the dose adjusted.   If dialysis is scheduled, keep all appointments. Call if you are unable to keep an appointment.  SEEK MEDICAL CARE IF:   You develop unexplained weakness, tiredness, or appetite loss.   You feel poorly with no clear explanation.  SEEK IMMEDIATE MEDICAL CARE IF:  The amount of urine you produce either distinctly increases or decreases.   You develop swelling of the face and/or ankles.   You develop shortness of breath.  FOR MORE INFORMATION  National Institute of Diabetes and Digestive and Kidney Diseases: CheatPrevention.com.au National Kidney Foundation: www.kidney.org Document Released: 06/01/2005 Document Revised: 05/21/2011 Document Reviewed: 10/02/2009 [facility] Patient Information 2012 [place], [place].Dehydration, Elderly Dehydration is when you lose more fluids from the body than you take in. Vital organs such as the kidneys, brain, and heart cannot function without a proper amount of fluids and salt. Any loss of fluids from the body can cause dehydration.  Older adults are at a higher risk  of dehydration than younger adults. As we age, our bodies are less able to conserve water and do not respond to temperature changes as well. Also, older adults do not become thirsty as easily or quickly. Because of this, older adults often do not realize they need to increase fluids to avoid dehydration.  CAUSES   Vomiting.   Diarrhea.   Excessive sweating.   Excessive urine output.   Fever.  SYMPTOMS  Mild dehydration  Thirst.   Dry lips.   Slightly dry mouth.  Moderate dehydration  Very dry mouth.   Sunken eyes.   Skin does not bounce back quickly when lightly pinched and released.   Dark urine and decreased urine production.   Decreased tear production.   Headache.  Severe dehydration  Very dry mouth.   Extreme thirst.   Rapid, weak pulse (more than 100 beats per minute at rest).   Cold hands and feet.   Not able to sweat in spite of heat.   Rapid breathing.   Blue lips.   Confusion and lethargy.   Difficulty being awakened.   Minimal urine production.   No tears.  DIAGNOSIS  Your caregiver will diagnose dehydration based on your symptoms and your exam. Blood and urine tests will help confirm the diagnosis. The diagnostic evaluation should also identify the cause of dehydration. TREATMENT  Treatment of mild or moderate dehydration can often be done at home by increasing the amount of fluids that you drink. It is best to drink small amounts of fluid more often. Drinking too much at one time can make vomiting worse.  Severe dehydration needs to be treated at the hospital where you will probably be given intravenous (IV) fluids that contain water and electrolytes. HOME CARE INSTRUCTIONS   Ask your caregiver about specific rehydration instructions.   Drink enough fluids to keep your urine clear or pale yellow.   Drink small amounts frequently if you have nausea and vomiting.   Eat as you normally do.   Avoid:   Foods or drinks high in sugar.    Carbonated drinks.   Juice.   Extremely hot or cold fluids.   Drinks with caffeine.   Fatty, greasy foods.   Alcohol.   Tobacco.   Overeating.   Gelatin desserts.   Wash your hands well to avoid spreading bacteria and viruses.   Only take over-the-counter or prescription medicines for pain, discomfort, or fever as directed by your caregiver.   Ask your caregiver if you should continue all prescribed and over-the-counter medicines.   Keep all follow-up appointments with your caregiver.  SEEK MEDICAL CARE IF:  You have abdominal pain and it increases or stays in one area (localizes).   You have a rash, stiff neck, or severe headache.   You are irritable, sleepy, or difficult to awaken.  You are weak, dizzy, or extremely thirsty.  SEEK IMMEDIATE MEDICAL CARE IF:   You are unable to keep fluids down, or you get worse despite treatment.   You have frequent episodes of vomiting or diarrhea.   You have blood or green matter (bile) in your vomit.   You have blood in your stool or your stool looks black and tarry.   You have not urinated in 6 to 8 hours, or you have only urinated a small amount of very dark urine.   You have a fever.   You faint.  MAKE SURE YOU:   Understand these instructions.   Will watch your condition.   Will get help right away if you are not doing well or get worse.  Document Released: 08/22/2003 Document Revised: 05/21/2011 Document Reviewed: 01/19/2011 [facility] Patient Information 2012 [place], [place].Cellulitis Cellulitis is an infection of the skin and the tissue beneath it. The area is typically red and tender. It is caused by germs (bacteria) (usually staph or strep) that enter the body through cuts or sores. Cellulitis most commonly occurs in the arms or lower legs.  HOME CARE INSTRUCTIONS   If you are given a prescription for medications which kill germs (antibiotics), take as directed until finished.   If the infection is on  the arm or leg, keep the limb elevated as able.   Use a warm cloth several times per day to relieve pain and encourage healing.   See your caregiver for recheck of the infected site as directed if problems arise.   Only take over-the-counter or prescription medicines for pain, discomfort, or fever as directed by your caregiver.  SEEK MEDICAL CARE IF:   The area of redness (inflammation) is spreading, there are red streaks coming from the infected site, or if a part of the infection begins to turn dark in color.   The joint or bone underneath the infected skin becomes painful after the skin has healed.   The infection returns in the same or another area after it seems to have gone away.   A boil or bump swells up. This may be an abscess.   New, unexplained problems such as pain or fever develop.  SEEK IMMEDIATE MEDICAL CARE IF:   You have a fever.   You or your child feels drowsy or lethargic.   There is vomiting, diarrhea, or lasting discomfort or feeling ill (malaise) with muscle aches and pains.  MAKE SURE YOU:   Understand these instructions.   Will watch your condition.   Will get help right away if you are not doing well or get worse.  Document Released: 03/11/2005 Document Revised: 05/21/2011 Document Reviewed: 01/18/2008 [facility] Patient Information 2012 [place], [place].Cellulitis Cellulitis is an infection of the skin and the tissue beneath it. The area is typically red and tender. It is caused by germs (bacteria) (usually staph or strep) that enter the body through cuts or sores. Cellulitis most commonly occurs in the arms or lower legs.  HOME CARE INSTRUCTIONS   If you are given a prescription for medications which kill germs (antibiotics), take as directed until finished.   If the infection is on the arm or leg, keep the limb elevated as able.   Use a warm cloth several times per day to relieve pain and encourage healing.   See your caregiver for recheck of the  infected site as directed if problems arise.   Only take over-the-counter or prescription medicines for pain, discomfort, or fever as directed  by your caregiver.  SEEK MEDICAL CARE IF:   The area of redness (inflammation) is spreading, there are red streaks coming from the infected site, or if a part of the infection begins to turn dark in color.   The joint or bone underneath the infected skin becomes painful after the skin has healed.   The infection returns in the same or another area after it seems to have gone away.   A boil or bump swells up. This may be an abscess.   New, unexplained problems such as pain or fever develop.  SEEK IMMEDIATE MEDICAL CARE IF:   You have a fever.   You or your child feels drowsy or lethargic.   There is vomiting, diarrhea, or lasting discomfort or feeling ill (malaise) with muscle aches and pains.  MAKE SURE YOU:   Understand these instructions.   Will watch your condition.   Will get help right away if you are not doing well or get worse.  Document Released: 03/11/2005 Document Revised: 05/21/2011 Document Reviewed: 01/18/2008 [facility] Patient Information 2012 [place], [place].

## 2011-11-22 NOTE — ED Notes (Signed)
I took CBG got reading of 174 mg/dcltr.

## 2011-11-22 NOTE — ED Provider Notes (Signed)
CDU Dehydration Discharge  Filed Vitals:   11/22/11 0012  BP: 150/88  Pulse: 81  Temp:   Resp: 16    Skin: 1 inch erythematous patch over the RLQ of the abdomen centered around a central punctate open area with no induration or underlying fluctuance  Results for orders placed during the hospital encounter of 11/21/11  GLUCOSE, CAPILLARY      Component Value Range   Glucose-Capillary 228 (*) 70 - 99 (mg/dL)  URINALYSIS, ROUTINE W REFLEX MICROSCOPIC      Component Value Range   Color, Urine YELLOW  YELLOW    APPearance CLEAR  CLEAR    Specific Gravity, Urine 1.014  1.005 - 1.030    pH 5.5  5.0 - 8.0    Glucose, UA NEGATIVE  NEGATIVE (mg/dL)   Hgb urine dipstick LARGE (*) NEGATIVE    Bilirubin Urine NEGATIVE  NEGATIVE    Ketones, ur NEGATIVE  NEGATIVE (mg/dL)   Protein, ur NEGATIVE  NEGATIVE (mg/dL)   Urobilinogen, UA 0.2  0.0 - 1.0 (mg/dL)   Nitrite NEGATIVE  NEGATIVE    Leukocytes, UA SMALL (*) NEGATIVE   CBC      Component Value Range   WBC 9.9  4.0 - 10.5 (K/uL)   RBC 3.87 (*) 4.22 - 5.81 (MIL/uL)   Hemoglobin 13.5  13.0 - 17.0 (g/dL)   HCT 16.1 (*) 09.6 - 52.0 (%)   MCV 95.9  78.0 - 100.0 (fL)   MCH 34.9 (*) 26.0 - 34.0 (pg)   MCHC 36.4 (*) 30.0 - 36.0 (g/dL)   RDW 04.5  40.9 - 81.1 (%)   Platelets 229  150 - 400 (K/uL)  DIFFERENTIAL      Component Value Range   Neutrophils Relative 56  43 - 77 (%)   Neutro Abs 5.5  1.7 - 7.7 (K/uL)   Lymphocytes Relative 30  12 - 46 (%)   Lymphs Abs 3.0  0.7 - 4.0 (K/uL)   Monocytes Relative 12  3 - 12 (%)   Monocytes Absolute 1.2 (*) 0.1 - 1.0 (K/uL)   Eosinophils Relative 2  0 - 5 (%)   Eosinophils Absolute 0.2  0.0 - 0.7 (K/uL)   Basophils Relative 1  0 - 1 (%)   Basophils Absolute 0.1  0.0 - 0.1 (K/uL)  BASIC METABOLIC PANEL      Component Value Range   Sodium 138  135 - 145 (mEq/L)   Potassium 4.4  3.5 - 5.1 (mEq/L)   Chloride 104  96 - 112 (mEq/L)   CO2 21  19 - 32 (mEq/L)   Glucose, Bld 240 (*) 70 - 99 (mg/dL)   BUN 29 (*) 6 - 23 (mg/dL)   Creatinine, Ser 9.14 (*) 0.50 - 1.35 (mg/dL)   Calcium 9.4  8.4 - 78.2 (mg/dL)   GFR calc non Af Amer 36 (*) >90 (mL/min)   GFR calc Af Amer 42 (*) >90 (mL/min)  TROPONIN I      Component Value Range   Troponin I <0.30  <0.30 (ng/mL)  URINE MICROSCOPIC-ADD ON      Component Value Range   Squamous Epithelial / LPF RARE  RARE    WBC, UA 7-10  <3 (WBC/hpf)   RBC / HPF 7-10  <3 (RBC/hpf)   Bacteria, UA MANY (*) RARE    Casts HYALINE CASTS (*) NEGATIVE    Urine-Other MUCOUS PRESENT    BASIC METABOLIC PANEL      Component Value Range   Sodium 141  135 - 145 (mEq/L)   Potassium 4.4  3.5 - 5.1 (mEq/L)   Chloride 108  96 - 112 (mEq/L)   CO2 22  19 - 32 (mEq/L)   Glucose, Bld 153 (*) 70 - 99 (mg/dL)   BUN 26 (*) 6 - 23 (mg/dL)   Creatinine, Ser 1.61 (*) 0.50 - 1.35 (mg/dL)   Calcium 8.8  8.4 - 09.6 (mg/dL)   GFR calc non Af Amer 42 (*) >90 (mL/min)   GFR calc Af Amer 49 (*) >90 (mL/min)  TROPONIN I      Component Value Range   Troponin I <0.30  <0.30 (ng/mL)  BASIC METABOLIC PANEL      Component Value Range   Sodium 141  135 - 145 (mEq/L)   Potassium 4.4  3.5 - 5.1 (mEq/L)   Chloride 111  96 - 112 (mEq/L)   CO2 22  19 - 32 (mEq/L)   Glucose, Bld 166 (*) 70 - 99 (mg/dL)   BUN 21  6 - 23 (mg/dL)   Creatinine, Ser 0.45  0.50 - 1.35 (mg/dL)   Calcium 8.7  8.4 - 40.9 (mg/dL)   GFR calc non Af Amer 54 (*) >90 (mL/min)   GFR calc Af Amer 63 (*) >90 (mL/min)  GLUCOSE, CAPILLARY      Component Value Range   Glucose-Capillary 174 (*) 70 - 99 (mg/dL)   visit  Patient was reevaluated by myself this morning.  Renal insufficiency had resolved with creatinine improved down to 1.3. Patient at that point showed me a small area of erythema over the right lower abdomen consistent with a very mild cellulitis. The patient was already on Bactrim that his urologist that started after stent removal last Tuesday. Of note patient did not develop renal insufficiency until after  treatment with Bactrim. His primary care physician had stopped his metformin as she was concerned that this might have contributed to his renal insufficiency but he has been on this medication for quite some time. I spoke by phone with Dr. Jacquelyne Balint from [facility] about discontinuing the patient's Bactrim. He was in agreement with this plan. Patient did have a urine culture sent yesterday during his initial evaluation for lightheadedness. This showed signs consistent with his dehydration but without an acute infection. I did start the patient on Keflex for his slight skin infection. He has no history of MRSA. Patient was given prescription for this. He was instructed to contact his primary care physician on Monday to discuss his admission as well as my concern over his reaction to his antibiotics. At that time his primary care physician can decide if she wants to restart his metformin or not. Patient was discharged in good condition.  Cyndra Numbers, MD 11/22/11 [phone]

## 2011-11-23 LAB — URINE CULTURE
Colony Count: NO GROWTH
Culture  Setup Time: 201306090529
Culture: NO GROWTH

## 2012-01-03 ENCOUNTER — Other Ambulatory Visit: Payer: Self-pay | Admitting: Cardiovascular Disease

## 2012-03-03 ENCOUNTER — Encounter: Payer: Self-pay | Admitting: Internal Medicine

## 2012-03-07 ENCOUNTER — Encounter: Payer: Self-pay | Admitting: Internal Medicine

## 2012-03-29 ENCOUNTER — Other Ambulatory Visit: Payer: Self-pay | Admitting: Cardiovascular Disease

## 2012-04-12 ENCOUNTER — Other Ambulatory Visit: Payer: Self-pay | Admitting: Cardiovascular Disease

## 2012-04-14 ENCOUNTER — Encounter: Payer: Self-pay | Admitting: Internal Medicine

## 2012-04-15 ENCOUNTER — Ambulatory Visit (AMBULATORY_SURGERY_CENTER): Payer: Medicare Other | Admitting: *Deleted

## 2012-04-15 VITALS — Ht 78.0 in | Wt 282.6 lb

## 2012-04-15 DIAGNOSIS — Z1211 Encounter for screening for malignant neoplasm of colon: Secondary | ICD-10-CM

## 2012-04-15 MED ORDER — MOVIPREP 100 G PO SOLR: ORAL | Status: DC

## 2012-04-29 ENCOUNTER — Encounter: Payer: Self-pay | Admitting: Internal Medicine

## 2012-04-29 ENCOUNTER — Ambulatory Visit (AMBULATORY_SURGERY_CENTER): Payer: Medicare Other | Admitting: Internal Medicine

## 2012-04-29 VITALS — BP 156/76 | HR 56 | Temp 95.9°F | Resp 20 | Ht 78.0 in | Wt 282.0 lb

## 2012-04-29 DIAGNOSIS — Z86010 Personal history of colonic polyps: Secondary | ICD-10-CM

## 2012-04-29 DIAGNOSIS — D126 Benign neoplasm of colon, unspecified: Secondary | ICD-10-CM

## 2012-04-29 DIAGNOSIS — Z1211 Encounter for screening for malignant neoplasm of colon: Secondary | ICD-10-CM

## 2012-04-29 MED ORDER — SODIUM CHLORIDE 0.9 % IV SOLN: 500.0000 mL | INTRAVENOUS | Status: DC

## 2012-04-29 NOTE — Patient Instructions (Addendum)

## 2012-04-29 NOTE — Op Note (Signed)
[facility] [address] [place], [address]   COLONOSCOPY PROCEDURE REPORT  PATIENT: [name], [name]  MR#: [id number] BIRTHDATE: 07/27/1940 , 71  yrs. old GENDER: Male ENDOSCOPIST: Hart Carwin, MD REFERRED BY:  Lupita Raider, M.D. PROCEDURE DATE:  04/29/2012 PROCEDURE:   Colonoscopy with cold biopsy polypectomy and Colonoscopy with snare polypectomy ASA CLASS:   Class II INDICATIONS:patient's personal history of adenomatous colon polyps and aden.  polyps in 2003 and 2007. MEDICATIONS: MAC sedation, administered by CRNA and Propofol (Diprivan) 260 mg IV  DESCRIPTION OF PROCEDURE:   After the risks and benefits and of the procedure were explained, informed consent was obtained.  A digital rectal exam revealed no abnormalities of the rectum.    The LB [id number]  endoscope was introduced through the anus and advanced to the cecum, which was identified by both the appendix and ileocecal valve .  The quality of the prep was good, using MoviPrep .  The instrument was then slowly withdrawn as the colon was fully examined.     COLON FINDINGS: Six smooth sessile polyps ranging between 3-48mm in size were found throughout the entire examined colon.  A polypectomy was performed with cold forceps and with a cold snare. The resection was complete and the polyp tissue was completely retrieved.   Mild diverticulosis was noted.     Retroflexed views revealed no abnormalities.     The scope was then withdrawn from the patient and the procedure completed.  COMPLICATIONS: There were no complications. ENDOSCOPIC IMPRESSION: 1.   Six sessile polyps ranging between 3-66mm in size were found throughout the entire examined colon; polypectomy was performed with cold forceps and with a cold snare 2.   Mild diverticulosis was noted  RECOMMENDATIONS: 1.  Await pathology results 2.  High fiber diet   REPEAT EXAM: In 5 year(s)  for  Colonoscopy.  cc:  _______________________________ eSignedHart Carwin, MD 04/29/2012 11:57 AM     PATIENT NAME:  [name], [name] MR#: [id number]

## 2012-04-29 NOTE — Progress Notes (Signed)
Patient did not experience any of the following events: a burn prior to discharge; a fall within the facility; wrong site/side/patient/procedure/implant event; or a hospital transfer or hospital admission upon discharge from the facility. (G8907) Patient did not have preoperative order for IV antibiotic SSI prophylaxis. (G8918)  

## 2012-05-02 ENCOUNTER — Telehealth: Payer: Self-pay

## 2012-05-02 NOTE — Telephone Encounter (Signed)
  Follow up Call-  Call back number 04/29/2012  Post procedure Call Back phone  # [phone]  Permission to leave phone message Yes     Patient questions:  Do you have a fever, pain , or abdominal swelling? no Pain Score  0 *  Have you tolerated food without any problems? yes  Have you been able to return to your normal activities? yes  Do you have any questions about your discharge instructions: Diet   no Medications  no Follow up visit  no  Do you have questions or concerns about your Care? no  Actions: * If pain score is 4 or above: No action needed, pain <4.

## 2012-05-04 ENCOUNTER — Encounter: Payer: Self-pay | Admitting: Internal Medicine

## 2012-05-09 ENCOUNTER — Other Ambulatory Visit: Payer: Self-pay | Admitting: Cardiovascular Disease

## 2012-05-18 ENCOUNTER — Other Ambulatory Visit: Payer: Self-pay | Admitting: Family Medicine

## 2012-05-18 ENCOUNTER — Ambulatory Visit
Admission: RE | Admit: 2012-05-18 | Discharge: 2012-05-18 | Disposition: A | Discharge status: Home / Self Care | Payer: Medicare Other | Source: Ambulatory Visit | Attending: Family Medicine | Admitting: Family Medicine

## 2012-05-18 DIAGNOSIS — R52 Pain, unspecified: Secondary | ICD-10-CM

## 2012-06-20 ENCOUNTER — Other Ambulatory Visit: Payer: Self-pay | Admitting: Cardiovascular Disease

## 2012-06-21 ENCOUNTER — Telehealth: Payer: Self-pay | Admitting: *Deleted

## 2012-06-21 MED ORDER — METOPROLOL TARTRATE 100 MG PO TABS: ORAL_TABLET | ORAL | Status: DC

## 2012-06-21 NOTE — Telephone Encounter (Signed)
Received refill request for lopressor. Pt has not been seen in office since August 2012. Will check on follow up.

## 2012-06-21 NOTE — Telephone Encounter (Signed)
Pt has appt with Dr. Clifton James on June 28, 2012. Will refill Lopressor

## 2012-06-23 ENCOUNTER — Ambulatory Visit (HOSPITAL_COMMUNITY)
Admission: RE | Admit: 2012-06-23 | Discharge: 2012-06-23 | Disposition: A | Discharge status: Home / Self Care | Payer: Medicare Other | Source: Ambulatory Visit | Attending: Cardiovascular Disease | Admitting: Cardiovascular Disease

## 2012-06-23 ENCOUNTER — Other Ambulatory Visit (HOSPITAL_COMMUNITY): Payer: Self-pay | Admitting: Specialist

## 2012-06-23 DIAGNOSIS — I82409 Acute embolism and thrombosis of unspecified deep veins of unspecified lower extremity: Secondary | ICD-10-CM | POA: Insufficient documentation

## 2012-06-23 NOTE — Progress Notes (Signed)
Right LEV duplex completed. Negative for DVT Saburo Luger, Gerarda Gunther

## 2012-06-24 ENCOUNTER — Other Ambulatory Visit: Payer: Self-pay | Admitting: Cardiovascular Disease

## 2012-06-28 ENCOUNTER — Other Ambulatory Visit: Payer: Self-pay | Admitting: *Deleted

## 2012-06-28 ENCOUNTER — Encounter: Payer: Self-pay | Admitting: Cardiovascular Disease

## 2012-06-28 ENCOUNTER — Ambulatory Visit (INDEPENDENT_AMBULATORY_CARE_PROVIDER_SITE_OTHER): Payer: Medicare Other | Admitting: Cardiovascular Disease

## 2012-06-28 VITALS — BP 138/70 | HR 66 | Ht 78.0 in | Wt 300.0 lb

## 2012-06-28 DIAGNOSIS — I10 Essential (primary) hypertension: Secondary | ICD-10-CM

## 2012-06-28 DIAGNOSIS — I2510 Atherosclerotic heart disease of native coronary artery without angina pectoris: Secondary | ICD-10-CM

## 2012-06-28 DIAGNOSIS — Z01810 Encounter for preprocedural cardiovascular examination: Secondary | ICD-10-CM

## 2012-06-28 MED ORDER — METOPROLOL TARTRATE 100 MG PO TABS: ORAL_TABLET | ORAL | Status: DC

## 2012-06-28 NOTE — Patient Instructions (Addendum)
Your physician wants you to follow-up in:  12 months.  You will receive a reminder letter in the mail two months in advance. If you don't receive a letter, please call our office to schedule the follow-up appointment.   

## 2012-06-28 NOTE — Progress Notes (Signed)
History of Present Illness: 72 yo WM with h/o CAD, DM, HLD, SVT here today for cardiac follow up. He has been followed in the past by Dr. Juanda Chance. He had a remote PTCA and DCA of the LAD. He underwent catheterization in 2000 because of anginal chest pain and was found to have nonobstructive CAD. 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 He couldn't afford the latter and later Cardizem was discontinued and he is currently only on metoprolol. He has a history of SVT ablation by Dr. Ladona Ridgel. Echo on 03/02/12 with moderate LVH, normal LV function. Exercise tolerance testing 03/02/12 and he exercised for 4 minutes without EKG changes suggestive of ischemia.   He says he's been doing quite well. He's had no chest pain, shortness of breath or palpitations.  He has been taking all of his medications. He does describe right knee pain and has seen Dr. Shelle Iron with orthopedics. He has plans for right knee replacement. He has slight edema in ankles during the day, resolves at night.   Primary Care Physician: Lupita Raider Orthopedics: Dr. Jene Every  Last Lipid Profile: Followed in primary care.    Past Medical History  Diagnosis Date  . Diabetes mellitus   . Hyperlipidemia   . SVT (supraventricular tachycardia)     s/p ablation per Dr. Ladona Ridgel  . Hypertension   . OSA on CPAP   . S/P ablation of ventricular arrhythmia   . GERD (gastroesophageal reflux disease)   . History of kidney stones   . Coronary artery disease CARDIOLOGIST - DR Verne Carrow-  LAST VISIT 01-27-2011  IN EPIC  . Right ureteral stone   . Bilateral kidney stones     Past Surgical History  Procedure Date  . Right ureteroscopic stone extraction 09-26-2010  . Laparoscopic cholecystectomy 03-26-2005  . Circumcision and fulgeration of condyloma 06-22-2003  . Cardiac electrophysiology study and ablation 2002  . Transthoracic echocardiogram 03-03-2011    MODERATE LVH/ LVSF NORMAL  / EF 60-65%/ MILDLY DILATED LEFT ATRIUMK  . Coronary angioplasty 1994    DCA OF THE LAD  . Cardiac catheterization 04-27-2007    CAD WITH 30% NARROWING IN THE MID-LAD/ 50 % NARROWING PROXIMAL CIRCUMFLEX/ 40% NARROWING SECOND MARGINAL BRANCH/ 40-50% PROXIMAL RCA WITH DISTAL POSTERIOR NARROWING/ NORMAL LVF  . Cardiac catheterization 2000    NON-OBSTRUCTIVE CAD  . Severeal ureteroscopic stone extractions   . Ureteroscopy 11/13/2011    Procedure: URETEROSCOPY;  Surgeon: Garnett Farm, MD;  Location: [facility];  Service: Urology;  Laterality: Right;  RIGHT URETEROSCOPY WITH HOLMIUM LASER LIHTOTRIPSY DIGITAL URETEROSCOPE    Current Outpatient Prescriptions  Medication Sig Dispense Refill  . aspirin 81 MG tablet Take 81 mg by mouth daily.       Marland Kitchen doxazosin (CARDURA) 2 MG tablet Take 2 mg by mouth daily.       . fish oil-omega-3 fatty acids 1000 MG capsule Take 4 capsules by mouth daily.       Marland Kitchen lisinopril (PRINIVIL,ZESTRIL) 20 MG tablet Take 20 mg by mouth daily.       . metFORMIN (GLUCOPHAGE) 1000 MG tablet Take 1,000 mg by mouth 2 (two) times daily with a meal.      . metoprolol (LOPRESSOR) 100 MG tablet Take 2 tablets by mouth every morning and 1 tablet every evening.  90 tablet  1  . Multiple Vitamins-Minerals (CENTRUM SILVER ULTRA MENS PO) Take 1 tablet by mouth daily.       Marland Kitchen  NOVOLIN 70/30 RELION (70-30) 100 UNIT/ML injection Takes 100 units before breakfast ; 20 units before lunch; and 100 units before supper      . omeprazole (PRILOSEC) 20 MG capsule Take 20 mg by mouth every morning.       . simvastatin (ZOCOR) 40 MG tablet TAKE ONE TABLET BY MOUTH EVERY DAY AT BEDTIME  30 tablet  0    Allergies  Allergen Reactions  . Codeine Itching  . Penicillins Rash    History   Social History  . Marital Status: Married    Spouse Name: N/A    Number of Children: N/A  . Years of Education: N/A   Occupational History  . Not on file.   Social History Main Topics  .  Smoking status: Former Smoker    Types: Cigarettes    Quit date: 06/15/1986  . Smokeless tobacco: Never Used  . Alcohol Use: Yes     Comment: occasional  . Drug Use: No  . Sexually Active: Not on file   Other Topics Concern  . Not on file   Social History Narrative  . No narrative on file    Family History  Problem Relation Age of Onset  . Heart attack Mother   . Heart attack Brother   .     Marland Kitchen Colon cancer Neg Hx   . Stomach cancer Neg Hx     Review of Systems:  As stated in the HPI and otherwise negative.   BP 138/70  Pulse 66  Ht 6\' 6"  (1.981 m)  Wt 300 lb (136.079 kg)  BMI 34.67 kg/m2  SpO2 94%  Physical Examination: General: Well developed, well nourished, NAD HEENT: OP clear, mucus membranes moist SKIN: warm, dry. No rashes. Neuro: No focal deficits Musculoskeletal: Muscle strength 5/5 all ext Psychiatric: Mood and affect normal Neck: No JVD, no carotid bruits, no thyromegaly, no lymphadenopathy. Lungs:Clear bilaterally, no wheezes, rhonci, crackles Cardiovascular: Regular rate and rhythm. No murmurs, gallops or rubs. Abdomen:Soft. Bowel sounds present. Non-tender.  Extremities: No lower extremity edema. Pulses are 2 + in the bilateral DP/PT.  EKG:   Echo 03/03/11: Left ventricle: Wall thickness was increased in a pattern of moderate LVH. Systolic function was normal. The estimated ejection fraction was in the range of 60% to 65%. - Left atrium: The atrium was mildly dilated.  Assessment and Plan:   1. CAD: Stable. No chest pain. Doing well. Continue current meds. He will stop his fish oil since this has been shown to have very little benefit.   2. Pre-operative cardiovascular examination: He is having no signs of symptoms of angina, CHF or arrythmias. LV function is normal. No ischemic workup needed before his planned surgical procedure. Proceed with surgery as planned.

## 2012-07-05 ENCOUNTER — Encounter (HOSPITAL_COMMUNITY): Payer: Self-pay | Admitting: Pharmacy Technician

## 2012-07-05 ENCOUNTER — Other Ambulatory Visit: Payer: Self-pay | Admitting: Orthopedic Surgery

## 2012-07-07 ENCOUNTER — Other Ambulatory Visit (HOSPITAL_COMMUNITY): Payer: Self-pay | Admitting: Specialist

## 2012-07-07 NOTE — Patient Instructions (Addendum)
20 TYRAIL [address]  07/07/2012   Your procedure is scheduled on: 07-18-2012  Report to [facility] at  1030 AM.  Call this number if you have problems the morning of surgery [phone]   Remember:BRING CPAP MASK AND TUBING, TAKE 1/2 DOSE OF PM NOVOLIN 70-30 INSULIN ON 22-07-2012   Do not eat food :After Midnight.  Clear liquids midnight until 0700 am day of surgery, then nothing by mouth   Take these medicines the morning of surgery with A SIP OF WATER: DOXAZOSIN, NORCO, METOPROLOL                                SEE [place] PREPARING FOR SURGERY SHEET   Do not wear jewelry, make-up or nail polish.  Do not wear lotions, powders, or perfumes. You may wear deodorant.   Men may shave face and neck.  Do not bring valuables to the hospital.  Contacts, dentures or bridgework may not be worn into surgery.  Leave suitcase in the car. After surgery it may be brought to your room.  For patients admitted to the hospital, checkout time is 11:00 AM the day of discharge.   Patients discharged the day of surgery will not be allowed to drive home.  Name and phone number of your driver:  Special Instructions: N/A   Please read over the following fact sheets that you were given: MRSA Information., blood fact sheet, incentive spirometer fact sheet Call Cain Sieve RN pre op nurse if needed [phone]    FAILURE TO FOLLOW THESE INSTRUCTIONS MAY RESULT IN THE CANCELLATION OF YOUR SURGERY. PATIENT SIGNATURE___________________________________________

## 2012-07-08 ENCOUNTER — Encounter (HOSPITAL_COMMUNITY): Payer: Self-pay

## 2012-07-08 ENCOUNTER — Encounter (HOSPITAL_COMMUNITY)
Admission: RE | Admit: 2012-07-08 | Discharge: 2012-07-08 | Disposition: A | Discharge status: Home / Self Care | Payer: Medicare Other | Source: Ambulatory Visit | Attending: Specialist | Admitting: Specialist

## 2012-07-08 ENCOUNTER — Ambulatory Visit (HOSPITAL_COMMUNITY)
Admission: RE | Admit: 2012-07-08 | Discharge: 2012-07-08 | Disposition: A | Discharge status: Home / Self Care | Payer: Medicare Other | Source: Ambulatory Visit | Attending: Orthopedic Surgery | Admitting: Orthopedic Surgery

## 2012-07-08 DIAGNOSIS — Z01812 Encounter for preprocedural laboratory examination: Secondary | ICD-10-CM | POA: Insufficient documentation

## 2012-07-08 DIAGNOSIS — M25469 Effusion, unspecified knee: Secondary | ICD-10-CM | POA: Insufficient documentation

## 2012-07-08 DIAGNOSIS — M1710 Unilateral primary osteoarthritis, unspecified knee: Secondary | ICD-10-CM | POA: Insufficient documentation

## 2012-07-08 HISTORY — DX: Unspecified osteoarthritis, unspecified site: M19.90

## 2012-07-08 LAB — PROTIME-INR: INR: 1.03 (ref 0.00–1.49)

## 2012-07-08 LAB — CBC
MCHC: 33.5 g/dL (ref 30.0–36.0)
Platelets: 246 10*3/uL (ref 150–400)
RDW: 12.2 % (ref 11.5–15.5)
WBC: 8.9 10*3/uL (ref 4.0–10.5)

## 2012-07-08 LAB — URINALYSIS, ROUTINE W REFLEX MICROSCOPIC
Bilirubin Urine: NEGATIVE
Glucose, UA: NEGATIVE mg/dL
Ketones, ur: NEGATIVE mg/dL
Leukocytes, UA: NEGATIVE
Nitrite: NEGATIVE
Protein, ur: NEGATIVE mg/dL

## 2012-07-08 LAB — BASIC METABOLIC PANEL
BUN: 26 mg/dL — ABNORMAL HIGH (ref 6–23)
Calcium: 9.1 mg/dL (ref 8.4–10.5)
Creatinine, Ser: 1.12 mg/dL (ref 0.50–1.35)
GFR calc Af Amer: 75 mL/min — ABNORMAL LOW (ref >90)

## 2012-07-08 LAB — SURGICAL PCR SCREEN: Staphylococcus aureus: NEGATIVE

## 2012-07-08 NOTE — Progress Notes (Signed)
bmet results routed to dr beane by epic

## 2012-07-08 NOTE — Progress Notes (Signed)
06-28-12 LOV CARDIAC CLEARANCE NOTE DR [facility] EPIC 06-28-12 EKG EPIC 11-21-11 CHEST 2 VIEW XRAY EPIC 02-21-11 ECHO EPIC

## 2012-07-12 ENCOUNTER — Other Ambulatory Visit: Payer: Self-pay | Admitting: Orthopedic Surgery

## 2012-07-12 NOTE — H&P (Signed)
[name] DOB: [birth year] Married / Language: English / Race: White Male  H&P date: 07/12/12  Chief Complaint: R knee pain  History of Present Illness The patient is a 72 year old male who comes in today for a preoperative History and Physical. The patient is scheduled for a right total knee arthroplasty to be performed by Dr. Javier Docker, MD at [facility] on 07/18/2012. Note for "H & P": They are now 2 month(s) out from injury with worsening pain, but noted intermittent achiness for years prior, especially with steps. Symptoms reported today include: pain and swelling. The patient feels that they are doing poorly and report their pain level to be 7-8 / 10. Current treatment includes: activity modification and pain medications. The following medication has been used for pain control: Hydrocodone 5/325mg , 2 pills every 6-8 hours with continued pain. Pain with ambulation. He is severely disabled by his symptoms.  He had a Doppler which was negative for DVT. He was seen by his medical physician and placed on a diuretic. He has much less swelling in the legs but still has severe pain when he attempts to ambulate. Again, back in the beginning of November he fell and had a direct impact to the knee. He had no relief from his cortisone injection.  Dr. Shelle Iron had an extensive discussion with [name] concerning his current pathology, relative anatomy and treatment options. We discussed additional injections but he declined. We discussed options given the severe arthrosis in multiple compartments, the impact edema in the femoral condyle and the tibia. We discussed the requirements and option of a total knee. We discussed the post operative course in detail.  Dr. Shelle Iron and the patient mutually agreed to proceed with a total knee replacement. Risks and benefits of the procedure were discussed including stiffness, suboptimal range of motion, persistent pain, infection requiring removal  of prosthesis and reinsertion, need for prophylactic antibiotics in the future, for example, dental procedures, possible need for manipulation, revision in the future and also anesthetic complications including DVT, PE, etc. We discussed the perioperative course, time in the hospital, postoperative recovery and the need for elevation to control swelling. We also discussed the predicted range of motion and the probability that squatting and kneeling would be unobtainable in the future. In addition, postoperative anticoagulation was discussed. We have obtained preoperative medical clearance from PCP Dr. Philipp Deputy and cardiologist Dr. Earney Hamburg. Provided him illustrated handout and discussed it in detail. They will enroll in the total joint replacement educational forum at the hospital.  He has a history of MRSA infection so he will undergo preoperative decolonization. We gave him prescriptions for Bactroban and the chlorhexidine wash.  He has a history of an allergy to Penicillin with itching, and + Hx MRSA so will use Vancomycin 2 grams pre-op. He will have to arrive early for that administration.  Post operative course three months until maximum medical improvement. He will have preoperative clearance by his cardiologist and his medical physician. If there are any changes in the interim he is to call.  Pt has already had his PAT appt at [facility].    Past Medial History DJD R knee Osteoarthritis, Hand Dislocation closed interphalangeal hand  Epicondylitis, lateral Spondylolisthesis, acquired Fibromatosis, plantar fascial Hypercholesterolemia High blood pressure Gastroesophageal Reflux Disease Kidney Stone Coronary artery disease Cardiac Arrhythmia Diabetes Mellitus, Type II Shingles. several years ago, R trunk, no postherpetic neuralgia Sleep Apnea. uses CPAP Gallbladder Problems. s/p lap chole  Allergies Codeine Sulfate *ANALGESICS - OPIOID* Penicillin G Sodium  *PENICILLINS*   Family History Diabetes Mellitus. sister Cancer. sister Congestive Heart Failure. mother Cerebrovascular Accident. brother Heart Disease. brother Heart disease in male family member before age 12 Heart disease in male family member before age 73 Hypertension. mother, father and brother   Social History Tobacco use. former smoker; smoke(d) 3/4 pack(s) per day; quit in 1988 Drug/Alcohol Rehab (Currently). no Drug/Alcohol Rehab (Previously). no Number of flights of stairs before winded. greater than 5 Children. 5 or more Current work status. working full time Exercise. Exercises weekly; does running / walking Alcohol use. never consumed alcohol Illicit drug use. no Living situation. live with spouse, one story house, one step into home Marital status. married Pain Contract. no Tobacco / smoke exposure. 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 (JCB/JMB/SSJ called in to [facility] @ [phone]) ICaps AREDS Formula ( Oral) Active. ReliOn 70/30 [place] (70-30% Suspension, Subcutaneous) Active. One-A-Day Mens (1 Oral) Active. Metoprolol Tartrate (100MG  Tablet, Oral) Active. Doxazosin Mesylate (2MG  Tablet, Oral) Active. Fish Oil Concentrate (435MG  Capsule, 1 Oral) Active. Omeprazole (20MG  Capsule DR, Oral) Active. MetFORMIN HCl (1000MG  Tablet, Oral) Active. Simvastatin (40MG  Tablet, Oral) Active. Aspirin (81MG  Tablet, 1 Oral) Active. Lisinopril (20MG  Tablet, Oral) Active. Medications Reconciled.   Past Surgical History Cataract Surgery. left Colon Polyp Removal - Colonoscopy Gallbladder Surgery. laporoscopic Other Surgery. lithotripsy multiple times; cardiac ablation 1994   Review of Systems General:Not Present- Chills, Fever, Night Sweats, Fatigue, Weight Gain, Weight Loss and Memory Loss. Skin:Not  Present- Hives, Itching, Rash, Eczema and Lesions. HEENT:Not Present- Tinnitus, Headache, Double Vision, Visual Loss, Hearing Loss and Dentures. Respiratory:Not Present- Shortness of breath with exertion, Shortness of breath at rest, Allergies, Coughing up blood and Chronic Cough. Cardiovascular:Not Present- Chest Pain, Racing/skipping heartbeats, Difficulty Breathing Lying Down, Murmur, Swelling and Palpitations. Gastrointestinal:Not Present- Bloody Stool, Heartburn, Abdominal Pain, Vomiting, Nausea, Constipation, Diarrhea, Difficulty Swallowing, Jaundice and Loss of appetitie. Male Genitourinary:Not Present- Urinary frequency, Blood in Urine, Weak urinary stream, Discharge, Flank Pain, Incontinence, Painful Urination, Urgency, Urinary Retention and Urinating at Night. Musculoskeletal:Present- Joint Stiffness, Joint Swelling and Joint Pain. Not Present- Muscle Weakness, Muscle Pain, Back Pain, Morning Stiffness and Spasms. Neurological:Not Present- Tremor, Dizziness, Blackout spells, Paralysis, Difficulty with balance and Weakness. Psychiatric:Not Present- Insomnia.   Vitals Weight: 290 lb Height: 78 in Body Surface Area: 2.69 m Body Mass Index: 33.51 kg/m Pulse: 68 (Regular) BP: 141/71 (Sitting, Left Arm, Standard)    Physical Exam The physical exam findings are as follows:  General Mental Status - Alert, cooperative and good historian. General Appearance- pleasant. Not in acute distress. Orientation- Oriented X3. Build & Nutrition- Well nourished and Well developed.  Head and Neck Head- normocephalic, atraumatic . Neck Global Assessment- supple. no bruit auscultated on the right and no bruit auscultated on the left.  Eye Pupil- Bilateral- PERRLA. Motion- Bilateral- EOMI.  Chest and Lung Exam Auscultation: Breath sounds:- clear at anterior chest wall and - clear at posterior chest wall. Adventitious sounds:- No Adventitious  sounds.  Cardiovascular Auscultation:Rhythm- Regular rate and rhythm. Heart Sounds- S1 WNL and S2 WNL. Murmurs & Other Heart Sounds:Auscultation of the heart reveals - No Murmurs.  Abdomen Palpation/Percussion:Tenderness- Abdomen is non-tender to palpation. Rigidity (guarding)- Abdomen is soft. Auscultation:Auscultation of the abdomen reveals - Bowel sounds normal.  Male Genitourinary Note: Not done, not pertinent to present illness  Musculoskeletal Note: He is  exquisitely tender over the medial joint line. Tender lateral joint line. He walks with a tentative antalgic gait. Patellofemoral pain with compression. ROM 0-120. Trace effusion. He does have some diffuse swelling into the calf. Negative Homan's sign. Straight leg raise is negative. Neurovascularly intact. Knee exam on inspection reveals no evidence of ecchymosis, deformity, or erythema. Nontender over the fibular head of the peroneal nerve. Nontender over the quadriceps insertion of the patellar ligament insertion. Provocative maneuvers revealed a negative Lachman, negative anterior and posterior drawer, and a negative McMurray's. No instability was noted with varus and valgus stressing at 0 or 30 degrees. On manual motor test the quadriceps and hamstrings were 5/5. Sensory exam was intact to light touch.  Assessment & Plan DJD right knee Impression: Symptomatic osteoarthritis of the right knee, refractory with a varus deformity, bone on bone with severe osteoarthritis. Impaction edema in the femur and tibia, status post trauma. Refractory to rest and activity modification and cortisone injection.    Note: MRI indicates complex tear of the anterior horn lateral meniscus, patellofemoral arthrosis severe, full thickness cartilage deficit, complex tear of the lateral meniscus. He does have edema within the bone of the tibia and femur.  Standing xrays with moderately severe medial joint space narrowing R knee. Varus  alignment.  Patient is scheduled for right total knee arthroplasty by Dr. Shelle Iron on 07/18/12. All questions answered. Reviewed risks, complications, alternatives. Discussed typical post-op course/protocol including DVT ppx, activity restrictions, PT. Plan for D/C home with home therapy. He has been cleared pre-operatively by his PCP and cardiologist. Will hold ASA, NSAIDs, vitamins, supplements at this point. MRSA decolonization. Vanco + Kefzol pre-op, will arrive early for abx. Remain NPO after MN night before surgery. Increased strength of Norco at visit today for better pain control. Consider Percocet upon hospital D/C. He will follow up 10-14 days post-op for suture removal. Call with questions or concerns prior.   Signed electronically by Dorothy Spark PA-C (07/12/2012 3:39 PM)

## 2012-07-17 MED ORDER — VANCOMYCIN HCL 10 G IV SOLR
1500.0000 mg | INTRAVENOUS | Status: AC
  Administered 2012-07-18: 1500 mg via INTRAVENOUS
  Administered 2012-07-18: 500 mg via INTRAVENOUS
  Filled 2012-07-17: qty 1500

## 2012-07-17 MED ORDER — DEXTROSE 5 % IV SOLN
3.0000 g | INTRAVENOUS | Status: DC
  Filled 2012-07-17: qty 3000

## 2012-07-18 ENCOUNTER — Encounter (HOSPITAL_COMMUNITY): Payer: Self-pay | Admitting: Anesthesiology

## 2012-07-18 ENCOUNTER — Encounter (HOSPITAL_COMMUNITY): Payer: Self-pay | Admitting: *Deleted

## 2012-07-18 ENCOUNTER — Inpatient Hospital Stay (HOSPITAL_COMMUNITY): Payer: Medicare Other | Admitting: Anesthesiology

## 2012-07-18 ENCOUNTER — Encounter (HOSPITAL_COMMUNITY)
Admission: RE | Disposition: A | Discharge status: Skilled Nursing Facility | Payer: Self-pay | Source: Ambulatory Visit | Attending: Specialist

## 2012-07-18 ENCOUNTER — Inpatient Hospital Stay (HOSPITAL_COMMUNITY): Payer: Medicare Other

## 2012-07-18 ENCOUNTER — Inpatient Hospital Stay (HOSPITAL_COMMUNITY)
Admission: RE | Admit: 2012-07-18 | Discharge: 2012-07-21 | DRG: 470 | Disposition: A | Discharge status: Skilled Nursing Facility | Payer: Medicare Other | Source: Ambulatory Visit | Attending: Specialist | Admitting: Specialist

## 2012-07-18 DIAGNOSIS — I10 Essential (primary) hypertension: Secondary | ICD-10-CM | POA: Diagnosis present

## 2012-07-18 DIAGNOSIS — M1711 Unilateral primary osteoarthritis, right knee: Secondary | ICD-10-CM

## 2012-07-18 DIAGNOSIS — I2510 Atherosclerotic heart disease of native coronary artery without angina pectoris: Secondary | ICD-10-CM | POA: Diagnosis present

## 2012-07-18 DIAGNOSIS — K219 Gastro-esophageal reflux disease without esophagitis: Secondary | ICD-10-CM | POA: Diagnosis present

## 2012-07-18 DIAGNOSIS — E7800 Pure hypercholesterolemia, unspecified: Secondary | ICD-10-CM | POA: Diagnosis present

## 2012-07-18 DIAGNOSIS — G4730 Sleep apnea, unspecified: Secondary | ICD-10-CM | POA: Diagnosis present

## 2012-07-18 DIAGNOSIS — E119 Type 2 diabetes mellitus without complications: Secondary | ICD-10-CM | POA: Diagnosis present

## 2012-07-18 DIAGNOSIS — Z79899 Other long term (current) drug therapy: Secondary | ICD-10-CM

## 2012-07-18 DIAGNOSIS — M1710 Unilateral primary osteoarthritis, unspecified knee: Principal | ICD-10-CM | POA: Diagnosis present

## 2012-07-18 DIAGNOSIS — Z8614 Personal history of Methicillin resistant Staphylococcus aureus infection: Secondary | ICD-10-CM

## 2012-07-18 DIAGNOSIS — Z87442 Personal history of urinary calculi: Secondary | ICD-10-CM

## 2012-07-18 HISTORY — PX: TOTAL KNEE ARTHROPLASTY: SHX125

## 2012-07-18 LAB — GLUCOSE, CAPILLARY
Glucose-Capillary: 114 mg/dL — ABNORMAL HIGH (ref 70–99)
Glucose-Capillary: 90 mg/dL (ref 70–99)

## 2012-07-18 LAB — TYPE AND SCREEN: ABO/RH(D): O POS

## 2012-07-18 SURGERY — ARTHROPLASTY, KNEE, TOTAL
Anesthesia: General | Site: Knee | Laterality: Right | Wound class: Clean

## 2012-07-18 MED ORDER — FLEET ENEMA 7-19 GM/118ML RE ENEM: 1.0000 | ENEMA | Freq: Once | RECTAL | Status: AC | PRN

## 2012-07-18 MED ORDER — DOCUSATE SODIUM 100 MG PO CAPS
100.0000 mg | ORAL_CAPSULE | Freq: Two times a day (BID) | ORAL | Status: DC
  Administered 2012-07-18 – 2012-07-21 (×6): 100 mg via ORAL

## 2012-07-18 MED ORDER — METHOCARBAMOL 100 MG/ML IJ SOLN
500.0000 mg | Freq: Four times a day (QID) | INTRAVENOUS | Status: DC | PRN
  Administered 2012-07-18: 500 mg via INTRAVENOUS
  Filled 2012-07-18: qty 5

## 2012-07-18 MED ORDER — DIPHENHYDRAMINE HCL 12.5 MG/5ML PO ELIX
12.5000 mg | ORAL_SOLUTION | ORAL | Status: DC | PRN
  Administered 2012-07-19: 25 mg via ORAL
  Filled 2012-07-18: qty 10

## 2012-07-18 MED ORDER — METHOCARBAMOL 500 MG PO TABS: 500.0000 mg | ORAL_TABLET | Freq: Three times a day (TID) | ORAL | Status: DC | PRN

## 2012-07-18 MED ORDER — SODIUM CHLORIDE 0.9 % IV SOLN
INTRAVENOUS | Status: DC | PRN
  Administered 2012-07-18 (×2): via INTRAVENOUS

## 2012-07-18 MED ORDER — POTASSIUM CHLORIDE IN NACL 20-0.9 MEQ/L-% IV SOLN
INTRAVENOUS | Status: DC
  Filled 2012-07-18 (×4): qty 1000

## 2012-07-18 MED ORDER — LACTATED RINGERS IV SOLN
INTRAVENOUS | Status: DC
  Administered 2012-07-18: 1000 mL via INTRAVENOUS

## 2012-07-18 MED ORDER — ZOLPIDEM TARTRATE 5 MG PO TABS: 5.0000 mg | ORAL_TABLET | Freq: Every evening | ORAL | Status: DC | PRN

## 2012-07-18 MED ORDER — DEXTROSE 5 % IV SOLN: 3.0000 g | Freq: Three times a day (TID) | INTRAVENOUS | Status: DC

## 2012-07-18 MED ORDER — ONDANSETRON HCL 4 MG PO TABS: 4.0000 mg | ORAL_TABLET | Freq: Four times a day (QID) | ORAL | Status: DC | PRN

## 2012-07-18 MED ORDER — METOPROLOL TARTRATE 100 MG PO TABS
100.0000 mg | ORAL_TABLET | Freq: Every day | ORAL | Status: DC
  Administered 2012-07-18 – 2012-07-20 (×3): 100 mg via ORAL
  Filled 2012-07-18 (×5): qty 1

## 2012-07-18 MED ORDER — INSULIN NPH ISOPHANE & REGULAR (70-30) 100 UNIT/ML ~~LOC~~ SUSP: 60.0000 [IU] | Freq: Two times a day (BID) | SUBCUTANEOUS | Status: DC

## 2012-07-18 MED ORDER — METOCLOPRAMIDE HCL 10 MG PO TABS: 5.0000 mg | ORAL_TABLET | Freq: Three times a day (TID) | ORAL | Status: DC | PRN

## 2012-07-18 MED ORDER — MEPERIDINE HCL 50 MG/ML IJ SOLN: 6.2500 mg | INTRAMUSCULAR | Status: DC | PRN

## 2012-07-18 MED ORDER — VANCOMYCIN HCL 500 MG IV SOLR
500.0000 mg | Freq: Once | INTRAVENOUS | Status: DC
  Filled 2012-07-18: qty 500

## 2012-07-18 MED ORDER — PHENOL 1.4 % MT LIQD: 1.0000 | OROMUCOSAL | Status: DC | PRN

## 2012-07-18 MED ORDER — INSULIN ASPART 100 UNIT/ML ~~LOC~~ SOLN: 0.0000 [IU] | Freq: Every day | SUBCUTANEOUS | Status: DC

## 2012-07-18 MED ORDER — INSULIN ASPART PROT & ASPART (70-30 MIX) 100 UNIT/ML ~~LOC~~ SUSP
90.0000 [IU] | Freq: Every day | SUBCUTANEOUS | Status: DC
  Administered 2012-07-18 – 2012-07-20 (×2): 90 [IU] via SUBCUTANEOUS

## 2012-07-18 MED ORDER — METOCLOPRAMIDE HCL 5 MG/ML IJ SOLN: 5.0000 mg | Freq: Three times a day (TID) | INTRAMUSCULAR | Status: DC | PRN

## 2012-07-18 MED ORDER — LACTATED RINGERS IV SOLN
INTRAVENOUS | Status: DC | PRN
  Administered 2012-07-18 (×3): via INTRAVENOUS

## 2012-07-18 MED ORDER — BISACODYL 5 MG PO TBEC: 5.0000 mg | DELAYED_RELEASE_TABLET | Freq: Every day | ORAL | Status: DC | PRN

## 2012-07-18 MED ORDER — SODIUM CHLORIDE 0.9 % IV SOLN
1500.0000 mg | Freq: Once | INTRAVENOUS | Status: AC
  Administered 2012-07-19: 1500 mg via INTRAVENOUS
  Filled 2012-07-18: qty 1500

## 2012-07-18 MED ORDER — SODIUM CHLORIDE 0.9 % IR SOLN
Status: DC | PRN
  Administered 2012-07-18: 3000 mL

## 2012-07-18 MED ORDER — MENTHOL 3 MG MT LOZG
1.0000 | LOZENGE | OROMUCOSAL | Status: DC | PRN
  Filled 2012-07-18: qty 9

## 2012-07-18 MED ORDER — CISATRACURIUM BESYLATE (PF) 10 MG/5ML IV SOLN
INTRAVENOUS | Status: DC | PRN
  Administered 2012-07-18: 10 mg via INTRAVENOUS

## 2012-07-18 MED ORDER — KETOROLAC TROMETHAMINE 30 MG/ML IJ SOLN
INTRAMUSCULAR | Status: AC
  Filled 2012-07-18: qty 1

## 2012-07-18 MED ORDER — PANTOPRAZOLE SODIUM 40 MG PO TBEC
40.0000 mg | DELAYED_RELEASE_TABLET | Freq: Every day | ORAL | Status: DC
  Administered 2012-07-18 – 2012-07-21 (×4): 40 mg via ORAL
  Filled 2012-07-18 (×4): qty 1

## 2012-07-18 MED ORDER — HYDROMORPHONE HCL PF 1 MG/ML IJ SOLN
INTRAMUSCULAR | Status: AC
  Administered 2012-07-19: 1 mg via INTRAVENOUS
  Filled 2012-07-18: qty 1

## 2012-07-18 MED ORDER — DOXAZOSIN MESYLATE 2 MG PO TABS
2.0000 mg | ORAL_TABLET | Freq: Every day | ORAL | Status: DC
  Administered 2012-07-19 – 2012-07-21 (×3): 2 mg via ORAL
  Filled 2012-07-18 (×4): qty 1

## 2012-07-18 MED ORDER — ONDANSETRON HCL 4 MG/2ML IJ SOLN
INTRAMUSCULAR | Status: DC | PRN
  Administered 2012-07-18: 4 mg via INTRAVENOUS

## 2012-07-18 MED ORDER — METFORMIN HCL 500 MG PO TABS
1000.0000 mg | ORAL_TABLET | Freq: Two times a day (BID) | ORAL | Status: DC
  Administered 2012-07-19 – 2012-07-21 (×4): 1000 mg via ORAL
  Filled 2012-07-18 (×7): qty 2

## 2012-07-18 MED ORDER — RIVAROXABAN 10 MG PO TABS: 10.0000 mg | ORAL_TABLET | Freq: Every day | ORAL | Status: DC

## 2012-07-18 MED ORDER — INSULIN ASPART PROT & ASPART (70-30 MIX) 100 UNIT/ML ~~LOC~~ SUSP
60.0000 [IU] | Freq: Every day | SUBCUTANEOUS | Status: DC
  Administered 2012-07-19 – 2012-07-21 (×3): 60 [IU] via SUBCUTANEOUS
  Filled 2012-07-18: qty 10

## 2012-07-18 MED ORDER — HYDROMORPHONE HCL PF 1 MG/ML IJ SOLN
0.2500 mg | INTRAMUSCULAR | Status: DC | PRN
  Administered 2012-07-18 (×6): 0.5 mg via INTRAVENOUS

## 2012-07-18 MED ORDER — MIDAZOLAM HCL 5 MG/5ML IJ SOLN
INTRAMUSCULAR | Status: DC | PRN
  Administered 2012-07-18 (×2): 1 mg via INTRAVENOUS

## 2012-07-18 MED ORDER — LISINOPRIL 10 MG PO TABS
10.0000 mg | ORAL_TABLET | Freq: Every evening | ORAL | Status: DC
  Administered 2012-07-18 – 2012-07-20 (×3): 10 mg via ORAL
  Filled 2012-07-18 (×4): qty 1

## 2012-07-18 MED ORDER — ATORVASTATIN CALCIUM 20 MG PO TABS
20.0000 mg | ORAL_TABLET | Freq: Every day | ORAL | Status: DC
  Administered 2012-07-18 – 2012-07-20 (×3): 20 mg via ORAL
  Filled 2012-07-18 (×4): qty 1

## 2012-07-18 MED ORDER — ACETAMINOPHEN 10 MG/ML IV SOLN: 1000.0000 mg | Freq: Once | INTRAVENOUS | Status: DC | PRN

## 2012-07-18 MED ORDER — ALUM & MAG HYDROXIDE-SIMETH 200-200-20 MG/5ML PO SUSP
30.0000 mL | ORAL | Status: DC | PRN
  Administered 2012-07-21: 30 mL via ORAL
  Filled 2012-07-18: qty 30

## 2012-07-18 MED ORDER — SODIUM CHLORIDE 0.9 % IR SOLN
Status: DC | PRN
  Administered 2012-07-18: 14:00:00

## 2012-07-18 MED ORDER — PROMETHAZINE HCL 25 MG/ML IJ SOLN
INTRAMUSCULAR | Status: AC
  Filled 2012-07-18: qty 1

## 2012-07-18 MED ORDER — KETOROLAC TROMETHAMINE 30 MG/ML IJ SOLN
30.0000 mg | Freq: Once | INTRAMUSCULAR | Status: AC
  Administered 2012-07-18: 30 mg via INTRAVENOUS

## 2012-07-18 MED ORDER — METOPROLOL TARTRATE 100 MG PO TABS
200.0000 mg | ORAL_TABLET | Freq: Every day | ORAL | Status: DC
  Administered 2012-07-19 – 2012-07-21 (×3): 200 mg via ORAL
  Filled 2012-07-18 (×3): qty 2

## 2012-07-18 MED ORDER — SUCCINYLCHOLINE CHLORIDE 20 MG/ML IJ SOLN
INTRAMUSCULAR | Status: DC | PRN
  Administered 2012-07-18: 140 mg via INTRAVENOUS

## 2012-07-18 MED ORDER — OXYCODONE-ACETAMINOPHEN 7.5-325 MG PO TABS: 1.0000 | ORAL_TABLET | ORAL | Status: DC | PRN

## 2012-07-18 MED ORDER — METHOCARBAMOL 500 MG PO TABS
500.0000 mg | ORAL_TABLET | Freq: Four times a day (QID) | ORAL | Status: DC | PRN
  Administered 2012-07-19 – 2012-07-21 (×9): 500 mg via ORAL
  Filled 2012-07-18 (×9): qty 1

## 2012-07-18 MED ORDER — PROMETHAZINE HCL 25 MG/ML IJ SOLN
6.2500 mg | INTRAMUSCULAR | Status: DC | PRN
  Administered 2012-07-18: 6.25 mg via INTRAVENOUS

## 2012-07-18 MED ORDER — ACETAMINOPHEN 10 MG/ML IV SOLN
INTRAVENOUS | Status: DC | PRN
  Administered 2012-07-18: 1000 mg via INTRAVENOUS

## 2012-07-18 MED ORDER — SIMVASTATIN 40 MG PO TABS
40.0000 mg | ORAL_TABLET | Freq: Every day | ORAL | Status: DC
  Administered 2012-07-18 – 2012-07-20 (×3): 40 mg via ORAL
  Filled 2012-07-18 (×4): qty 1

## 2012-07-18 MED ORDER — PROPOFOL 10 MG/ML IV EMUL
INTRAVENOUS | Status: DC | PRN
  Administered 2012-07-18: 225 mg via INTRAVENOUS

## 2012-07-18 MED ORDER — RIVAROXABAN 10 MG PO TABS
10.0000 mg | ORAL_TABLET | Freq: Every day | ORAL | Status: DC
  Administered 2012-07-19 – 2012-07-21 (×3): 10 mg via ORAL
  Filled 2012-07-18 (×4): qty 1

## 2012-07-18 MED ORDER — FUROSEMIDE 20 MG PO TABS
20.0000 mg | ORAL_TABLET | Freq: Every day | ORAL | Status: DC
  Administered 2012-07-19 – 2012-07-21 (×3): 20 mg via ORAL
  Filled 2012-07-18 (×4): qty 1

## 2012-07-18 MED ORDER — HYDROCODONE-ACETAMINOPHEN 5-325 MG PO TABS
1.0000 | ORAL_TABLET | ORAL | Status: DC | PRN
  Administered 2012-07-18 – 2012-07-21 (×5): 2 via ORAL
  Filled 2012-07-18 (×5): qty 2

## 2012-07-18 MED ORDER — ACETAMINOPHEN 325 MG PO TABS: 650.0000 mg | ORAL_TABLET | Freq: Four times a day (QID) | ORAL | Status: DC | PRN

## 2012-07-18 MED ORDER — ONDANSETRON HCL 4 MG/2ML IJ SOLN: 4.0000 mg | Freq: Four times a day (QID) | INTRAMUSCULAR | Status: DC | PRN

## 2012-07-18 MED ORDER — INSULIN ASPART 100 UNIT/ML ~~LOC~~ SOLN
0.0000 [IU] | Freq: Three times a day (TID) | SUBCUTANEOUS | Status: DC
  Administered 2012-07-19 – 2012-07-20 (×3): 2 [IU] via SUBCUTANEOUS
  Administered 2012-07-20: 3 [IU] via SUBCUTANEOUS
  Administered 2012-07-21: 2 [IU] via SUBCUTANEOUS

## 2012-07-18 MED ORDER — ACETAMINOPHEN 650 MG RE SUPP: 650.0000 mg | Freq: Four times a day (QID) | RECTAL | Status: DC | PRN

## 2012-07-18 MED ORDER — BUPIVACAINE-EPINEPHRINE 0.5% -1:200000 IJ SOLN
INTRAMUSCULAR | Status: DC | PRN
  Administered 2012-07-18: 30 mL

## 2012-07-18 MED ORDER — MORPHINE SULFATE 2 MG/ML IJ SOLN
1.0000 mg | INTRAMUSCULAR | Status: DC | PRN
  Administered 2012-07-18 – 2012-07-19 (×3): 1 mg via INTRAVENOUS
  Administered 2012-07-19: 2 mg via INTRAVENOUS
  Filled 2012-07-18 (×4): qty 1

## 2012-07-18 MED ORDER — OXYCODONE-ACETAMINOPHEN 5-325 MG PO TABS
1.0000 | ORAL_TABLET | Freq: Four times a day (QID) | ORAL | Status: DC | PRN
  Administered 2012-07-19: 2 via ORAL
  Filled 2012-07-18: qty 2

## 2012-07-18 MED ORDER — FENTANYL CITRATE 0.05 MG/ML IJ SOLN
INTRAMUSCULAR | Status: DC | PRN
  Administered 2012-07-18: 100 ug via INTRAVENOUS
  Administered 2012-07-18 (×2): 50 ug via INTRAVENOUS
  Administered 2012-07-18 (×2): 100 ug via INTRAVENOUS

## 2012-07-18 MED ORDER — SENNOSIDES-DOCUSATE SODIUM 8.6-50 MG PO TABS
1.0000 | ORAL_TABLET | Freq: Every evening | ORAL | Status: DC | PRN
  Filled 2012-07-18: qty 1

## 2012-07-18 MED ORDER — LIDOCAINE HCL (CARDIAC) 20 MG/ML IV SOLN
INTRAVENOUS | Status: DC | PRN
  Administered 2012-07-18: 100 mg via INTRAVENOUS

## 2012-07-18 SURGICAL SUPPLY — 65 items
BAG SPEC THK2 15X12 ZIP CLS (MISCELLANEOUS)
BAG ZIPLOCK 12X15 (MISCELLANEOUS) ×1 IMPLANT
BANDAGE ELASTIC 4 VELCRO ST LF (GAUZE/BANDAGES/DRESSINGS) ×2 IMPLANT
BANDAGE ELASTIC 6 VELCRO ST LF (GAUZE/BANDAGES/DRESSINGS) ×1 IMPLANT
BANDAGE ESMARK 6X9 LF (GAUZE/BANDAGES/DRESSINGS) ×1 IMPLANT
BLADE SAG 18X100X1.27 (BLADE) ×2 IMPLANT
BLADE SAW SGTL 13.0X1.19X90.0M (BLADE) ×2 IMPLANT
BNDG CMPR 9X6 STRL LF SNTH (GAUZE/BANDAGES/DRESSINGS) ×1
BNDG ESMARK 6X9 LF (GAUZE/BANDAGES/DRESSINGS) ×2
CEMENT HV SMART SET (Cement) ×4 IMPLANT
CHLORAPREP W/TINT 26ML (MISCELLANEOUS) IMPLANT
CLOTH BEACON ORANGE TIMEOUT ST (SAFETY) ×2 IMPLANT
CUFF TOURN SGL QUICK 34 (TOURNIQUET CUFF) ×2
CUFF TRNQT CYL 34X4X40X1 (TOURNIQUET CUFF) ×1 IMPLANT
DECANTER SPIKE VIAL GLASS SM (MISCELLANEOUS) ×2 IMPLANT
DRAPE INCISE IOBAN 66X45 STRL (DRAPES) ×1 IMPLANT
DRAPE LG THREE QUARTER DISP (DRAPES) ×2 IMPLANT
DRAPE ORTHO SPLIT 77X108 STRL (DRAPES) ×4
DRAPE POUCH INSTRU U-SHP 10X18 (DRAPES) ×2 IMPLANT
DRAPE SURG ORHT 6 SPLT 77X108 (DRAPES) ×2 IMPLANT
DRAPE U-SHAPE 47X51 STRL (DRAPES) ×2 IMPLANT
DRSG ADAPTIC 3X8 NADH LF (GAUZE/BANDAGES/DRESSINGS) ×1 IMPLANT
DRSG AQUACEL AG ADV 3.5X10 (GAUZE/BANDAGES/DRESSINGS) ×1 IMPLANT
DRSG PAD ABDOMINAL 8X10 ST (GAUZE/BANDAGES/DRESSINGS) ×1 IMPLANT
DRSG TEGADERM 4X4.75 (GAUZE/BANDAGES/DRESSINGS) ×1 IMPLANT
DURAPREP 26ML APPLICATOR (WOUND CARE) ×2 IMPLANT
ELECT REM PT RETURN 9FT ADLT (ELECTROSURGICAL) ×2
ELECTRODE REM PT RTRN 9FT ADLT (ELECTROSURGICAL) ×1 IMPLANT
EVACUATOR 1/8 PVC DRAIN (DRAIN) ×2 IMPLANT
FACESHIELD LNG OPTICON STERILE (SAFETY) ×10 IMPLANT
GLOVE BIOGEL PI IND STRL 7.5 (GLOVE) ×1 IMPLANT
GLOVE BIOGEL PI IND STRL 8 (GLOVE) ×1 IMPLANT
GLOVE BIOGEL PI INDICATOR 7.5 (GLOVE) ×1
GLOVE BIOGEL PI INDICATOR 8 (GLOVE) ×1
GLOVE SURG SS PI 7.5 STRL IVOR (GLOVE) ×2 IMPLANT
GLOVE SURG SS PI 8.0 STRL IVOR (GLOVE) ×4 IMPLANT
GOWN PREVENTION PLUS LG XLONG (DISPOSABLE) ×2 IMPLANT
GOWN PREVENTION PLUS XLARGE (GOWN DISPOSABLE) ×2 IMPLANT
GOWN STRL REIN XL XLG (GOWN DISPOSABLE) ×4 IMPLANT
HANDPIECE INTERPULSE COAX TIP (DISPOSABLE) ×2
IMMOBILIZER KNEE 20 (SOFTGOODS)
IMMOBILIZER KNEE 20 THIGH 36 (SOFTGOODS) ×1 IMPLANT
KIT BASIN OR (CUSTOM PROCEDURE TRAY) ×2 IMPLANT
MANIFOLD NEPTUNE II (INSTRUMENTS) ×2 IMPLANT
NS IRRIG 1000ML POUR BTL (IV SOLUTION) ×2 IMPLANT
PACK TOTAL JOINT (CUSTOM PROCEDURE TRAY) ×2 IMPLANT
PADDING CAST COTTON 6X4 STRL (CAST SUPPLIES) ×2 IMPLANT
POSITIONER SURGICAL ARM (MISCELLANEOUS) ×2 IMPLANT
SET HNDPC FAN SPRY TIP SCT (DISPOSABLE) ×1 IMPLANT
SPONGE GAUZE 4X4 12PLY (GAUZE/BANDAGES/DRESSINGS) ×1 IMPLANT
SPONGE SURGIFOAM ABS GEL 100 (HEMOSTASIS) ×2 IMPLANT
STAPLER VISISTAT (STAPLE) ×2 IMPLANT
SUCTION FRAZIER 12FR DISP (SUCTIONS) ×2 IMPLANT
SUT BONE WAX W31G (SUTURE) ×2 IMPLANT
SUT VIC AB 1 CT1 27 (SUTURE) ×8
SUT VIC AB 1 CT1 27XBRD ANTBC (SUTURE) ×4 IMPLANT
SUT VIC AB 2-0 CT1 27 (SUTURE) ×4
SUT VIC AB 2-0 CT1 TAPERPNT 27 (SUTURE) ×3 IMPLANT
SUT VLOC 180 0 24IN GS25 (SUTURE) ×2 IMPLANT
SYR 30ML LL (SYRINGE) ×2 IMPLANT
TOWEL OR 17X26 10 PK STRL BLUE (TOWEL DISPOSABLE) ×2 IMPLANT
TOWER CARTRIDGE SMART MIX (DISPOSABLE) ×2 IMPLANT
TRAY FOLEY CATH 14FRSI W/METER (CATHETERS) ×2 IMPLANT
WATER STERILE IRR 1500ML POUR (IV SOLUTION) ×2 IMPLANT
WRAP KNEE MAXI GEL POST OP (GAUZE/BANDAGES/DRESSINGS) ×2 IMPLANT

## 2012-07-18 NOTE — Progress Notes (Signed)
Vancomycin Consult Indication: Surgical prophylaxis  71 yom with history of MRSA infection (unclear where/when infection was) underwent pre-operative decolonization with Bactroban and chlorhexidine wash prior to TKA on 2/3.  Patient received Vancomycin 1500 mg x 1 pre-op @ 1330 today.  MD ordered for Vancomycin post-op for prophylaxis.  MD also order Ancef x 3 doses post-op.  Patient wts 132kg, no recent Scr.  Plan:  Vancomycin 1500 mg IV as a one time dose 12 hours after the pre-op dose.  Pharmacy will discontinue Ancef x 3 doses.   Pharmacy will sign off.  Geoffry Paradise, PharmD, BCPS Pager: [phone] 6:25 PM Pharmacy #: [phone]

## 2012-07-18 NOTE — Anesthesia Postprocedure Evaluation (Signed)
Anesthesia Post Note  Patient: [name]  Procedure(s) Performed: Procedure(s) (LRB): TOTAL KNEE ARTHROPLASTY (Right)  Anesthesia type: General  Patient location: PACU  Post pain: Pain level controlled  Post assessment: Post-op Vital signs reviewed  Last Vitals: BP 185/76  Pulse 63  Temp 36.4 C  Resp 15  SpO2 95%  Post vital signs: Reviewed  Level of consciousness: sedated  Complications: No apparent anesthesia complications

## 2012-07-18 NOTE — Preoperative (Signed)
Beta Blockers   Reason not to administer Beta Blockers:Not Applicablept took beta blocker this a.m. 

## 2012-07-18 NOTE — H&P (View-Only) (Signed)
[name] DOB: 03/09/1941 Married / Language: English / Race: White Male  H&P date: 07/12/12  Chief Complaint: R knee pain  History of Present Illness The patient is a 72 year old male who comes in today for a preoperative History and Physical. The patient is scheduled for a right total knee arthroplasty to be performed by Dr. Jeffrey C. Beane, MD at [facility] on 07/18/2012. Note for "H & P": They are now 2 month(s) out from injury with worsening pain, but noted intermittent achiness for years prior, especially with steps. Symptoms reported today include: pain and swelling. The patient feels that they are doing poorly and report their pain level to be 7-8 / 10. Current treatment includes: activity modification and pain medications. The following medication has been used for pain control: Hydrocodone 5/325mg, 2 pills every 6-8 hours with continued pain. Pain with ambulation. He is severely disabled by his symptoms.  He had a Doppler which was negative for DVT. He was seen by his medical physician and placed on a diuretic. He has much less swelling in the legs but still has severe pain when he attempts to ambulate. Again, back in the beginning of November he fell and had a direct impact to the knee. He had no relief from his cortisone injection.  Dr. Beane had an extensive discussion with [name] concerning his current pathology, relative anatomy and treatment options. We discussed additional injections but he declined. We discussed options given the severe arthrosis in multiple compartments, the impact edema in the femoral condyle and the tibia. We discussed the requirements and option of a total knee. We discussed the post operative course in detail.  Dr. Beane and the patient mutually agreed to proceed with a total knee replacement. Risks and benefits of the procedure were discussed including stiffness, suboptimal range of motion, persistent pain, infection requiring removal  of prosthesis and reinsertion, need for prophylactic antibiotics in the future, for example, dental procedures, possible need for manipulation, revision in the future and also anesthetic complications including DVT, PE, etc. We discussed the perioperative course, time in the hospital, postoperative recovery and the need for elevation to control swelling. We also discussed the predicted range of motion and the probability that squatting and kneeling would be unobtainable in the future. In addition, postoperative anticoagulation was discussed. We have obtained preoperative medical clearance from PCP Dr. Kim Shaw and cardiologist Dr. Chris McAlhany. Provided him illustrated handout and discussed it in detail. They will enroll in the total joint replacement educational forum at the hospital.  He has a history of MRSA infection so he will undergo preoperative decolonization. We gave him prescriptions for Bactroban and the chlorhexidine wash.  He has a history of an allergy to Penicillin with itching, and + Hx MRSA so will use Vancomycin 2 grams pre-op. He will have to arrive early for that administration.  Post operative course three months until maximum medical improvement. He will have preoperative clearance by his cardiologist and his medical physician. If there are any changes in the interim he is to call.  Pt has already had his PAT appt at [facility].    Past Medial History DJD R knee Osteoarthritis, Hand Dislocation closed interphalangeal hand  Epicondylitis, lateral Spondylolisthesis, acquired Fibromatosis, plantar fascial Hypercholesterolemia High blood pressure Gastroesophageal Reflux Disease Kidney Stone Coronary artery disease Cardiac Arrhythmia Diabetes Mellitus, Type II Shingles. several years ago, R trunk, no postherpetic neuralgia Sleep Apnea. uses CPAP Gallbladder Problems. s/p lap chole     Allergies Codeine Sulfate *ANALGESICS - OPIOID* Penicillin G Sodium  *PENICILLINS*   Family History Diabetes Mellitus. sister Cancer. sister Congestive Heart Failure. mother Cerebrovascular Accident. brother Heart Disease. brother Heart disease in male family member before age 65 Heart disease in male family member before age 55 Hypertension. mother, father and brother   Social History Tobacco use. former smoker; smoke(d) 3/4 pack(s) per day; quit in 1988 Drug/Alcohol Rehab (Currently). no Drug/Alcohol Rehab (Previously). no Number of flights of stairs before winded. greater than 5 Children. 5 or more Current work status. working full time Exercise. Exercises weekly; does running / walking Alcohol use. never consumed alcohol Illicit drug use. no Living situation. live with spouse, one story house, one step into home Marital status. married Pain Contract. no Tobacco / smoke exposure. 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 (JCB/JMB/SSJ called in to [facility] @ [phone]) ICaps AREDS Formula ( Oral) Active. ReliOn 70/30 [place] (70-30% Suspension, Subcutaneous) Active. One-A-Day Mens (1 Oral) Active. Metoprolol Tartrate (100MG Tablet, Oral) Active. Doxazosin Mesylate (2MG Tablet, Oral) Active. Fish Oil Concentrate (435MG Capsule, 1 Oral) Active. Omeprazole (20MG Capsule DR, Oral) Active. MetFORMIN HCl (1000MG Tablet, Oral) Active. Simvastatin (40MG Tablet, Oral) Active. Aspirin (81MG Tablet, 1 Oral) Active. Lisinopril (20MG Tablet, Oral) Active. Medications Reconciled.   Past Surgical History Cataract Surgery. left Colon Polyp Removal - Colonoscopy Gallbladder Surgery. laporoscopic Other Surgery. lithotripsy multiple times; cardiac ablation 1994   Review of Systems General:Not Present- Chills, Fever, Night Sweats, Fatigue, Weight Gain, Weight Loss and Memory Loss. Skin:Not  Present- Hives, Itching, Rash, Eczema and Lesions. HEENT:Not Present- Tinnitus, Headache, Double Vision, Visual Loss, Hearing Loss and Dentures. Respiratory:Not Present- Shortness of breath with exertion, Shortness of breath at rest, Allergies, Coughing up blood and Chronic Cough. Cardiovascular:Not Present- Chest Pain, Racing/skipping heartbeats, Difficulty Breathing Lying Down, Murmur, Swelling and Palpitations. Gastrointestinal:Not Present- Bloody Stool, Heartburn, Abdominal Pain, Vomiting, Nausea, Constipation, Diarrhea, Difficulty Swallowing, Jaundice and Loss of appetitie. Male Genitourinary:Not Present- Urinary frequency, Blood in Urine, Weak urinary stream, Discharge, Flank Pain, Incontinence, Painful Urination, Urgency, Urinary Retention and Urinating at Night. Musculoskeletal:Present- Joint Stiffness, Joint Swelling and Joint Pain. Not Present- Muscle Weakness, Muscle Pain, Back Pain, Morning Stiffness and Spasms. Neurological:Not Present- Tremor, Dizziness, Blackout spells, Paralysis, Difficulty with balance and Weakness. Psychiatric:Not Present- Insomnia.   Vitals Weight: 290 lb Height: 78 in Body Surface Area: 2.69 m Body Mass Index: 33.51 kg/m Pulse: 68 (Regular) BP: 141/71 (Sitting, Left Arm, Standard)    Physical Exam The physical exam findings are as follows:  General Mental Status - Alert, cooperative and good historian. General Appearance- pleasant. Not in acute distress. Orientation- Oriented X3. Build & Nutrition- Well nourished and Well developed.  Head and Neck Head- normocephalic, atraumatic . Neck Global Assessment- supple. no bruit auscultated on the right and no bruit auscultated on the left.  Eye Pupil- Bilateral- PERRLA. Motion- Bilateral- EOMI.  Chest and Lung Exam Auscultation: Breath sounds:- clear at anterior chest wall and - clear at posterior chest wall. Adventitious sounds:- No Adventitious  sounds.  Cardiovascular Auscultation:Rhythm- Regular rate and rhythm. Heart Sounds- S1 WNL and S2 WNL. Murmurs & Other Heart Sounds:Auscultation of the heart reveals - No Murmurs.  Abdomen Palpation/Percussion:Tenderness- Abdomen is non-tender to palpation. Rigidity (guarding)- Abdomen is soft. Auscultation:Auscultation of the abdomen reveals - Bowel sounds normal.  Male Genitourinary Note: Not done, not pertinent to present illness  Musculoskeletal Note: He is   exquisitely tender over the medial joint line. Tender lateral joint line. He walks with a tentative antalgic gait. Patellofemoral pain with compression. ROM 0-120. Trace effusion. He does have some diffuse swelling into the calf. Negative Homan's sign. Straight leg raise is negative. Neurovascularly intact. Knee exam on inspection reveals no evidence of ecchymosis, deformity, or erythema. Nontender over the fibular head of the peroneal nerve. Nontender over the quadriceps insertion of the patellar ligament insertion. Provocative maneuvers revealed a negative Lachman, negative anterior and posterior drawer, and a negative McMurray's. No instability was noted with varus and valgus stressing at 0 or 30 degrees. On manual motor test the quadriceps and hamstrings were 5/5. Sensory exam was intact to light touch.  Assessment & Plan DJD right knee Impression: Symptomatic osteoarthritis of the right knee, refractory with a varus deformity, bone on bone with severe osteoarthritis. Impaction edema in the femur and tibia, status post trauma. Refractory to rest and activity modification and cortisone injection.    Note: MRI indicates complex tear of the anterior horn lateral meniscus, patellofemoral arthrosis severe, full thickness cartilage deficit, complex tear of the lateral meniscus. He does have edema within the bone of the tibia and femur.  Standing xrays with moderately severe medial joint space narrowing R knee. Varus  alignment.  Patient is scheduled for right total knee arthroplasty by Dr. Beane on 07/18/12. All questions answered. Reviewed risks, complications, alternatives. Discussed typical post-op course/protocol including DVT ppx, activity restrictions, PT. Plan for D/C home with home therapy. He has been cleared pre-operatively by his PCP and cardiologist. Will hold ASA, NSAIDs, vitamins, supplements at this point. MRSA decolonization. Vanco + Kefzol pre-op, will arrive early for abx. Remain NPO after MN night before surgery. Increased strength of Norco at visit today for better pain control. Consider Percocet upon hospital D/C. He will follow up 10-14 days post-op for suture removal. Call with questions or concerns prior.   Signed electronically by JACLYN M BISSELL PA-C (07/12/2012 3:39 PM) 

## 2012-07-18 NOTE — Brief Op Note (Signed)
07/18/2012  3:59 PM  PATIENT:  [name]  72 y.o. male  PRE-OPERATIVE DIAGNOSIS:  DJD Right Knee  POST-OPERATIVE DIAGNOSIS:  DJD Right Knee  PROCEDURE:  Procedure(s) (LRB) with comments: TOTAL KNEE ARTHROPLASTY (Right)  SURGEON:  Surgeon(s) and Role:    * Javier Docker, MD - Primary  PHYSICIAN ASSISTANT:   ASSISTANTS: Bissell   ANESTHESIA:   general  EBL:  Total I/O In: 2300 [I.V.:2300] Out: 350 [Urine:350]  BLOOD ADMINISTERED:none  DRAINS: Hemovac   LOCAL MEDICATIONS USED:  SPECIMEN:  No Specimen  DISPOSITION OF SPECIMEN:  N/A  COUNTS:  YES  TOURNIQUET:   Total Tourniquet Time Documented: Thigh (Right) - 110 minutes  DICTATION: .Other Dictation: Dictation Number [id number]  PLAN OF CARE: Admit to inpatient   PATIENT DISPOSITION:  PACU - hemodynamically stable.   Delay start of Pharmacological VTE agent (>24hrs) due to surgical blood loss or risk of bleeding: no

## 2012-07-18 NOTE — Interval H&P Note (Signed)
History and Physical Interval Note:  07/18/2012 1:01 PM  [name]  has presented today for surgery, with the diagnosis of DJD Right Knee  The various methods of treatment have been discussed with the patient and family. After consideration of risks, benefits and other options for treatment, the patient has consented to  Procedure(s) (LRB) with comments: TOTAL KNEE ARTHROPLASTY (Right) as a surgical intervention .  The patient's history has been reviewed, patient examined, no change in status, stable for surgery.  I have reviewed the patient's chart and labs.  Questions were answered to the patient's satisfaction.     Kazmir Oki C  Needs Vancomycin infused prior to Procedure.

## 2012-07-18 NOTE — Anesthesia Preprocedure Evaluation (Addendum)
Anesthesia Evaluation  Patient identified by MRN, date of birth, ID band Patient awake    Reviewed: Allergy & Precautions, H&P , NPO status , Patient's Chart, lab work & pertinent test results, reviewed documented beta blocker date and time   Airway Mallampati: III TM Distance: >3 FB Neck ROM: full    Dental No notable dental hx. (+) Teeth Intact and Dental Advisory Given   Pulmonary sleep apnea and Continuous Positive Airway Pressure Ventilation ,  breath sounds clear to auscultation  Pulmonary exam normal       Cardiovascular Exercise Tolerance: Good hypertension, Pt. on medications + CAD + dysrhythmias Supra Ventricular Tachycardia Rhythm:regular Rate:Normal  Ablation for SVT.  Last cardiology visit 8/12   Neuro/Psych negative neurological ROS  negative psych ROS   GI/Hepatic negative GI ROS, Neg liver ROS, GERD-  Medicated and Controlled,  Endo/Other  diabetes, Type 2, Oral Hypoglycemic AgentsMorbid obesity  Renal/GU Renal diseasenegative Renal ROS     Musculoskeletal   Abdominal (+) + obese,   Peds  Hematology negative hematology ROS (+)   Anesthesia Other Findings   Reproductive/Obstetrics                          Anesthesia Physical  Anesthesia Plan  ASA: III  Anesthesia Plan: General   Post-op Pain Management:    Induction: Intravenous  Airway Management Planned: LMA  Additional Equipment:   Intra-op Plan:   Post-operative Plan:   Informed Consent: I have reviewed the patients History and Physical, chart, labs and discussed the procedure including the risks, benefits and alternatives for the proposed anesthesia with the patient or authorized representative who has indicated his/her understanding and acceptance.   Dental Advisory Given  Plan Discussed with: CRNA and Surgeon  Anesthesia Plan Comments:         Anesthesia Quick Evaluation

## 2012-07-18 NOTE — Plan of Care (Signed)
Problem: Consults Goal: Diagnosis- Total Joint Replacement Right total knee     

## 2012-07-18 NOTE — Transfer of Care (Signed)
Immediate Anesthesia Transfer of Care Note  Patient: [name]  Procedure(s) Performed: Procedure(s) (LRB) with comments: TOTAL KNEE ARTHROPLASTY (Right)  Patient Location: PACU  Anesthesia Type:General  Level of Consciousness: sedated  Airway & Oxygen Therapy: Patient Spontanous Breathing and Patient connected to face mask oxygen  Post-op Assessment: Report given to PACU RN and Post -op Vital signs reviewed and stable  Post vital signs: Reviewed and stable  Complications: No apparent anesthesia complications

## 2012-07-19 ENCOUNTER — Encounter (HOSPITAL_COMMUNITY): Payer: Self-pay | Admitting: Specialist

## 2012-07-19 LAB — BASIC METABOLIC PANEL
Calcium: 8.4 mg/dL (ref 8.4–10.5)
GFR calc non Af Amer: 65 mL/min — ABNORMAL LOW (ref >90)
Sodium: 140 mEq/L (ref 135–145)

## 2012-07-19 LAB — CBC
MCH: 34 pg (ref 26.0–34.0)
MCHC: 35.3 g/dL (ref 30.0–36.0)
Platelets: 170 10*3/uL (ref 150–400)
RBC: 3.62 MIL/uL — ABNORMAL LOW (ref 4.22–5.81)

## 2012-07-19 LAB — HEMOGLOBIN A1C
Hgb A1c MFr Bld: 6.8 % — ABNORMAL HIGH (ref <5.7)
Mean Plasma Glucose: 148 mg/dL — ABNORMAL HIGH (ref <117)

## 2012-07-19 LAB — GLUCOSE, CAPILLARY: Glucose-Capillary: 114 mg/dL — ABNORMAL HIGH (ref 70–99)

## 2012-07-19 MED ORDER — OXYCODONE HCL 5 MG PO TABS
2.5000 mg | ORAL_TABLET | ORAL | Status: DC | PRN
  Administered 2012-07-19 – 2012-07-21 (×7): 2.5 mg via ORAL
  Filled 2012-07-19 (×7): qty 1

## 2012-07-19 MED ORDER — OXYCODONE-ACETAMINOPHEN 5-325 MG PO TABS
1.0000 | ORAL_TABLET | ORAL | Status: DC | PRN
  Administered 2012-07-19: 2 via ORAL
  Filled 2012-07-19: qty 2

## 2012-07-19 MED ORDER — HYDROMORPHONE HCL PF 1 MG/ML IJ SOLN
1.0000 mg | Freq: Once | INTRAMUSCULAR | Status: AC
  Administered 2012-07-19: 1 mg via INTRAVENOUS

## 2012-07-19 MED ORDER — LORAZEPAM 2 MG/ML IJ SOLN
0.5000 mg | Freq: Four times a day (QID) | INTRAMUSCULAR | Status: DC | PRN
Start: 2012-07-19 — End: 2012-07-21

## 2012-07-19 MED ORDER — OXYCODONE-ACETAMINOPHEN 5-325 MG PO TABS
1.0000 | ORAL_TABLET | ORAL | Status: DC | PRN
  Administered 2012-07-19 – 2012-07-21 (×9): 1 via ORAL
  Filled 2012-07-19 (×9): qty 1

## 2012-07-19 MED ORDER — HYDROMORPHONE HCL PF 1 MG/ML IJ SOLN
1.0000 mg | INTRAMUSCULAR | Status: DC | PRN
  Administered 2012-07-19 (×4): 1 mg via INTRAVENOUS
  Filled 2012-07-19 (×5): qty 1

## 2012-07-19 MED ORDER — HYDROMORPHONE HCL PF 1 MG/ML IJ SOLN
INTRAMUSCULAR | Status: AC
  Filled 2012-07-19: qty 1

## 2012-07-19 NOTE — Care Management Note (Unsigned)
    Page 1 of 1   07/19/2012     5:08:06 PM   CARE MANAGEMENT NOTE 07/19/2012  Patient:  JOHNATHA, ZEIDMAN   Account Number:  [id number]  Date Initiated:  07/19/2012  Documentation initiated by:  Colleen Can  Subjective/Objective Assessment:   DX DEGENERATIVE JOINT DISEASE RT KNEE; TOTAL KNEE REPLACEMNT  Pre-arranged with Genevieve Norlander prior to admission to provide hh services.     Action/Plan:   cM SPOKE WITH PATIENT AND DAUGHTER. Current plans are for patient to rreturn to his home in [place] where spouse will be caregiver. He will need RW and 3n1   Anticipated DC Date:  07/21/2012   Anticipated DC Plan:  HOME W HOME HEALTH SERVICES  In-house referral  Clinical Social Worker      DC Associate Professor  CM consult      PAC Choice  DURABLE MEDICAL EQUIPMENT  HOME HEALTH   Choice offered to / List presented to:  C-1 Patient      DME agency  [facility]        Status of service:  In process, will continue to follow Medicare Important Message given?   (If response is "NO", the following Medicare IM given date fields will be blank) Date Medicare IM given:   Date Additional Medicare IM given:    Discharge Disposition:    Per UR Regulation:    If discussed at Long Length of Stay Meetings, dates discussed:    Comments:  07/19/2012 Colleen Can BSN RN CCM [phone] [facility] rep has been notified of DME needs. Daughter request that I speak with pt's spouse who is currently not in rm. CM will follow.

## 2012-07-19 NOTE — Progress Notes (Signed)
Utilization review completed.  

## 2012-07-19 NOTE — Progress Notes (Signed)
Pt placed on CPAP with home mask and tubing. Pressure is set at 16 cmH2O per home settings. Asked pt to call RN if any problems arose .

## 2012-07-19 NOTE — Progress Notes (Signed)
07/19/12 1300  PT Visit Information  Last PT Received On 07/19/12  Assistance Needed +1  PT Time Calculation  PT Start Time 1227  PT Stop Time 1258  PT Time Calculation (min) 31 min  Subjective Data  Subjective I am hurting  Precautions  Precautions Knee  Required Braces or Orthoses Knee Immobilizer - Right  Knee Immobilizer - Right Discontinue once straight leg raise with < 10 degree lag  Restrictions  Weight Bearing Restrictions No  Cognition  Overall Cognitive Status Appears within functional limits for tasks assessed/performed  Arousal/Alertness Awake/alert  Orientation Level Appears intact for tasks assessed  Behavior During Session [facility] for tasks performed  Bed Mobility  Bed Mobility Sit to Supine  Sit to Supine 4: Min assist  Details for Bed Mobility Assistance cues for self assist, technique  Transfers  Transfers Sit to Stand;Stand to Sit  Sit to Stand 4: Min assist;From chair/3-in-1;With upper extremity assist  Stand to Sit 4: Min assist;To bed;To elevated surface  Details for Transfer Assistance cues for hand placement and RLE position/management  Ambulation/Gait  Ambulation/Gait Assistance 4: Min assist  Ambulation Distance (Feet) 5 Feet  Assistive device Rolling walker  Ambulation/Gait Assistance Details cues for sequence  Gait Pattern Step-to pattern;Antalgic;Decreased stance time - right  Total Joint Exercises  Ankle Circles/Pumps AROM;Both;10 reps  Quad Sets AROM;Both;10 reps  Heel Slides AROM;10 reps;AAROM;Right  Hip ABduction/ADduction AROM;Right;10 reps  Straight Leg Raises AROM;AAROM;Right;10 reps  PT - End of Session  Equipment Utilized During Treatment Right knee immobilizer  Activity Tolerance Patient limited by pain  Patient left in bed;with call bell/phone within reach;with family/visitor present  Nurse Communication Mobility status  PT - Assessment/Plan  Comments on Treatment Session pt progressing well; amb limited by pain this pm  PT Plan  Discharge plan remains appropriate;Frequency remains appropriate  PT Frequency 7X/week  Follow Up Recommendations Home health PT  PT equipment Rolling walker with 5" wheels  Acute Rehab PT Goals  Time For Goal Achievement 07/19/12  Potential to Achieve Goals Good  Pt will go Supine/Side to Sit with supervision  PT Goal: Supine/Side to Sit - Progress Progressing toward goal  Pt will go Sit to Stand with supervision  PT Goal: Sit to Stand - Progress Progressing toward goal  Pt will go Stand to Sit with supervision  PT Goal: Stand to Sit - Progress Progressing toward goal  Pt will Ambulate 51 - 150 feet;with supervision;with rolling walker  PT Goal: Ambulate - Progress Progressing toward goal  Pt will Perform Home Exercise Program with supervision, verbal cues required/provided  PT Goal: Perform Home Exercise Program - Progress Progressing toward goal  PT General Charges  $$ ACUTE PT VISIT 1 Procedure  PT Treatments  $Therapeutic Exercise 23-37 mins

## 2012-07-19 NOTE — Progress Notes (Signed)
Patient wishes to self administer CPAP when ready--home FFM mask/tubing, all necessary equipment in room. RT will assist as needed.

## 2012-07-19 NOTE — Op Note (Signed)
[name], [name]                 ACCOUNT NO.:  [id number]  MEDICAL RECORD NO.:  [id number]  LOCATION:  1613                         FACILITY:  [facility]  PHYSICIAN:  Jene Every, M.D.    DATE OF BIRTH:  Jul 02, 1940  DATE OF PROCEDURE:  07/18/2012 DATE OF DISCHARGE:                              OPERATIVE REPORT   PREOPERATIVE DIAGNOSES:  End-stage osteoarthrosis, degenerative joint disease, right knee.  POSTOPERATIVE DIAGNOSES:  End-stage osteoarthrosis, degenerative joint disease, right knee.  PROCEDURE PERFORMED:  Right total knee arthroplasty.  ANESTHESIA:  General.  ASSISTANT:  Lanna Poche.  COMPONENTS:  DePuy rotating platform, 5 femur, 5 tibia, 10-mm insert, 41 patella.  HISTORY:  A 72 year old with end-stage osteoarthrosis of the knee, refractory to conservative treatment, indicated for placement degenerated joint, bone-on-bone.  Risks and benefits discussed including bleeding, infection, DVT, PE, anesthetic complications, suboptimal range of motion, need for revision.  The patient received vancomycin capsule due to history of MRSA infection.  TECHNIQUE:  With the patient in supine position, after induction of adequate general anesthesia, 2 g of Vanco and 2 g of Kefzol.  Right lower extremity was prepped and draped and exsanguinated in usual sterile fashion.  Thigh tourniquet inflated to 300 mmHg.  Knee was flexed.  Midline incision was made.  Full-thickness flaps were developed.  Median parapatellar arthrotomy was performed.  Patella was everted.  Knee was flexed.  Tricompartmental osteoarthrosis was noted. Medial and lateral remnants of menisci were excised.  Step drill was utilized to enter the femoral canal, was irrigated, 5-degree right was placed, 11 of the femur to a slight flexion contracture.  Posterior femoral cut performed.  We sized off the anterior cortex to a 5.  This was pinned in 3 degrees of external rotation.  Anterior, posterior, and chamfer  cuts were performed.  Attention was turned towards to the tibia, subluxed.  External alignment guide used, 10 off the high side which was lateral.  Bisecting the ankle parallel to the tibia, this was then pinned, cut, soft tissues protected at all times.  I used a Sizer, the flexion and extension gaps were equivalent.  The attention turned towards completing the tibia.  It was subluxed, maximized to a 5 just medial aspect of the tibial tubercle, pinned, drilled, punch guide utilized, completed at the femur with a box cut.  Jig applied to the lateral surface thin box cut utilized.  Trial femur and tibia were utilized with a 10 insert.  We had full extension, full flexion, good stability, varus-valgus stressing 0-30 degrees.  Good patellofemoral tracking.  Attention was turned towards the patella and was sized, thickness 25, cut to a 14.  We used a 41 patellar planer utilized.  We drilled our PEG holes for the patella medializing the patella.  Trial patella placed.  We had excellent patellofemoral tracking with flexion and extension.  All components trial were removed.  We checked posteriorly any remnants of menisci were removed.  Geniculates were cauterized.  Popliteus was intact.  Pulsatile lavage was used in the joint, low pressure on the soft tissue, higher pressure on the bony surfaces.  Mixed cement on the back table.  Knee He was flexed.  All surfaces dried.  We injected cement into the tibia and digitally pressurizing packing 5 tibial tray, redundant cement removed.  We cemented the femur, redundant cement removed.  10 insert placed trial, reduced, held in extension with axial load applied throughout the curing of the cement, cemented the patella.  After curing of the cement, removed the trial.  Redundant cement was meticulously removed. Copiously irrigated with antibiotic irrigation.  We trialed it to a 10. We placed a 10 permanent insert.  We had full extension, full flexion, good  stability, varus-valgus stressing 0-30 degrees, and excellent patellofemoral tracking.  Next, Hemovac was placed and brought out through a lateral stab wound in the skin.  Patellar arthrotomy was reapproximated with 1 Vicryl interrupted figure-of-8 sutures, then running V-Loc, subcu with 2-0 Vicryl simple sutures.  Skin was reapproximated with staples.  Wound was dressed sterilely.  The knee was flexed to 90 degrees against gravity.  Good patellofemoral tracking. Sterile dressing applied.  Tourniquet was deflated and there was adequate revascularization of lower extremity appreciated.  The patient tolerated the procedure well.  No complications.  Tourniquet time was 110 minutes.  Minimal blood loss.     Jene Every, M.D.     Cordelia Pen  D:  07/18/2012  T:  07/19/2012  Job:  [id number]

## 2012-07-19 NOTE — Evaluation (Signed)
Occupational Therapy Evaluation Patient Details Name: [name] MRN: [id number] DOB: September 26, 1940 Today's Date: 07/19/2012 Time: 4098-1191 OT Time Calculation (min): 26 min  OT Assessment / Plan / Recommendation Clinical Impression  Pt doing well POD 1 RTKR. Skilled OT recommended to maximize independence with BADLs to supervision level in prep for d/c home.    OT Assessment  Patient needs continued OT Services    Follow Up Recommendations  Other (comment) (TBD based on progress.)    Barriers to Discharge      Equipment Recommendations  Other (comment) (wide 3n1)    Recommendations for Other Services    Frequency  Min 2X/week    Precautions / Restrictions Precautions Precautions: Knee Required Braces or Orthoses: Knee Immobilizer - Right Knee Immobilizer - Right: Discontinue once straight leg raise with < 10 degree lag Restrictions Weight Bearing Restrictions: No   Pertinent Vitals/Pain Reported 7/10 R knee pain. Repositioned and cold applied.    ADL  Grooming: Set up Where Assessed - Grooming: Unsupported sitting Upper Body Bathing: Set up Where Assessed - Upper Body Bathing: Unsupported sitting Lower Body Bathing: Moderate assistance Where Assessed - Lower Body Bathing: Supported sit to stand Upper Body Dressing: Set up Where Assessed - Upper Body Dressing: Unsupported sitting Lower Body Dressing: Moderate assistance Where Assessed - Lower Body Dressing: Supported sit to Pharmacist, hospital: Minimal assistance Statistician Method: Sit to Barista: Other (comment) (recliner.) Toileting - Clothing Manipulation and Hygiene: Minimal assistance Where Assessed - Toileting Clothing Manipulation and Hygiene: Sit to stand from 3-in-1 or toilet Equipment Used: Rolling walker Transfers/Ambulation Related to ADLs: Pt ambulated to the bathroom with minguard A and RW.    OT Diagnosis: Generalized weakness  OT Problem List: Decreased activity  tolerance;Decreased knowledge of use of DME or AE;Pain OT Treatment Interventions: Self-care/ADL training;Therapeutic activities;DME and/or AE instruction;Patient/family education   OT Goals Acute Rehab OT Goals OT Goal Formulation: With patient/family Time For Goal Achievement: 07/26/12 Potential to Achieve Goals: Good ADL Goals Pt Will Perform Grooming: with supervision;Standing at sink ADL Goal: Grooming - Progress: Goal set today Pt Will Perform Lower Body Bathing: with supervision;Sit to stand from chair;Sit to stand from bed ADL Goal: Lower Body Bathing - Progress: Goal set today Pt Will Perform Lower Body Dressing: with supervision;Sit to stand from chair;Sit to stand from bed ADL Goal: Lower Body Dressing - Progress: Goal set today Pt Will Transfer to Toilet: with supervision;Ambulation;with DME ADL Goal: Toilet Transfer - Progress: Goal set today Pt Will Perform Toileting - Clothing Manipulation: with supervision;Sitting on 3-in-1 or toilet;Standing ADL Goal: Toileting - Clothing Manipulation - Progress: Goal set today Pt Will Perform Toileting - Hygiene: with supervision;Sit to stand from 3-in-1/toilet ADL Goal: Toileting - Hygiene - Progress: Goal set today  Visit Information  Last OT Received On: 07/19/12 Assistance Needed: +1 PT/OT Co-Evaluation/Treatment: Yes    Subjective Data  Subjective: I can really put weight down through this leg? Patient Stated Goal: Not asked.   Prior Functioning     Home Living Lives With: Spouse Available Help at Discharge: Family Type of Home: House Home Access: Stairs to enter Secretary/administrator of Steps: 1+1 Entrance Stairs-Rails: None Home Layout: One level Bathroom Shower/Tub: Engineer, manufacturing systems: Standard Bathroom Accessibility: Yes Home Adaptive Equipment: Paediatric nurse with back;Crutches Prior Function Level of Independence: Needs assistance Needs Assistance: Meal Prep;Light Housekeeping Meal Prep:  Total Light Housekeeping: Total Able to Take Stairs?: Yes Driving: No Vocation: Part time employment Communication Communication:  No difficulties Dominant Hand: Right         Vision/Perception     Cognition  Cognition Overall Cognitive Status: Appears within functional limits for tasks assessed/performed Arousal/Alertness: Awake/alert Orientation Level: Appears intact for tasks assessed Behavior During Session: [facility] for tasks performed    Extremity/Trunk Assessment Right Upper Extremity Assessment RUE ROM/Strength/Tone: [facility] for tasks assessed Left Upper Extremity Assessment LUE ROM/Strength/Tone: [facility] for tasks assessed      Mobility Bed Mobility Bed Mobility: Supine to Sit;Sit to Supine Supine to Sit: 4: Min assist;HOB elevated Details for Bed Mobility Assistance: cues for self assist, technique Transfers Sit to Stand: 4: Min assist;With upper extremity assist;From elevated surface;From bed Stand to Sit: To chair/3-in-1;4: Min assist;With upper extremity assist Details for Transfer Assistance: cues for hand placement and RLE position/management     Exercise    Balance     End of Session OT - End of Session Activity Tolerance: Patient tolerated treatment well Patient left: in chair;with call bell/phone within reach;with family/visitor present  GO     Yesha Muchow A OTR/L [phone] 07/19/2012, 11:38 AM

## 2012-07-19 NOTE — Progress Notes (Signed)
Patient ID: [name], male   DOB: [birth year], 72 y.o.   MRN: [id number] Subjective: 1 Day Post-Op Procedure(s) (LRB): TOTAL KNEE ARTHROPLASTY (Right) Patient reports pain as moderate.  Reports relief with pain medication but short duration. Denies itching, N/V. Difficulty sleeping overnight due to pain. No other complaints.  Patient has complaints of right knee pain  We will start therapy today. Plan is to go home with home health after hospital stay.  Objective: Vital signs in last 24 hours: Temp:  [97.4 F (36.3 C)-98.1 F (36.7 C)] 97.9 F (36.6 C) (02/04 0440) Pulse Rate:  [56-86] 65  (02/04 0440) Resp:  [12-20] 16  (02/04 0440) BP: (127-187)/(63-81) 146/75 mmHg (02/04 0440) SpO2:  [95 %-100 %] 97 % (02/04 0440) Weight:  [132.45 kg (292 lb)] 132.45 kg (292 lb) (02/03 1823)  Intake/Output from previous day:  Intake/Output Summary (Last 24 hours) at 07/19/12 0743 Last data filed at 07/19/12 1191  Gross per 24 hour  Intake   4003 ml  Output   1215 ml  Net   2788 ml    Intake/Output this shift:    Labs: Results for orders placed during the hospital encounter of 07/18/12  TYPE AND SCREEN      Component Value Range   ABO/RH(D) O POS     Antibody Screen NEG     Sample Expiration 07/21/2012    GLUCOSE, CAPILLARY      Component Value Range   Glucose-Capillary 114 (*) 70 - 99 mg/dL   Comment 1 Documented in Chart    ABO/RH      Component Value Range   ABO/RH(D) O POS    GLUCOSE, CAPILLARY      Component Value Range   Glucose-Capillary 90  70 - 99 mg/dL  GLUCOSE, CAPILLARY      Component Value Range   Glucose-Capillary 114 (*) 70 - 99 mg/dL   Comment 1 Notify RN     Comment 2 Documented in Chart    CBC      Component Value Range   WBC 11.7 (*) 4.0 - 10.5 K/uL   RBC 3.62 (*) 4.22 - 5.81 MIL/uL   Hemoglobin 12.3 (*) 13.0 - 17.0 g/dL   HCT 47.8 (*) 29.5 - 62.1 %   MCV 96.1  78.0 - 100.0 fL   MCH 34.0  26.0 - 34.0 pg   MCHC 35.3  30.0 - 36.0 g/dL   RDW 30.8   65.7 - 84.6 %   Platelets 170  150 - 400 K/uL  BASIC METABOLIC PANEL      Component Value Range   Sodium 140  135 - 145 mEq/L   Potassium 3.8  3.5 - 5.1 mEq/L   Chloride 106  96 - 112 mEq/L   CO2 27  19 - 32 mEq/L   Glucose, Bld 71  70 - 99 mg/dL   BUN 16  6 - 23 mg/dL   Creatinine, Ser 9.62  0.50 - 1.35 mg/dL   Calcium 8.4  8.4 - 95.2 mg/dL   GFR calc non Af Amer 65 (*) >90 mL/min   GFR calc Af Amer 75 (*) >90 mL/min  HEMOGLOBIN A1C      Component Value Range   Hemoglobin A1C 6.8 (*) <5.7 %   Mean Plasma Glucose 148 (*) <117 mg/dL  GLUCOSE, CAPILLARY      Component Value Range   Glucose-Capillary 120 (*) 70 - 99 mg/dL  GLUCOSE, CAPILLARY      Component Value Range  Glucose-Capillary 151 (*) 70 - 99 mg/dL   Comment 1 Notify RN    GLUCOSE, CAPILLARY      Component Value Range   Glucose-Capillary 76  70 - 99 mg/dL    Exam - Neurologically intact Neurovascular intact Sensation intact distally Intact pulses distally Dorsiflexion/Plantar flexion intact Incision: dressing C/D/I No cellulitis present Compartment soft No calf pain, Negative Homan's, No sign of DVT Dressing - clean, dry, no drainage Motor function intact - moving foot and toes well on exam. Pedal pulses 2+ Hemovac pulled without difficulty. Aquacel dressing in place over incision, Tegaderm over drain site  Assessment/Plan: 1 Day Post-Op Procedure(s) (LRB): TOTAL KNEE ARTHROPLASTY (Right)  Advance diet Up with therapy D/C IV fluids Past Medical History  Diagnosis Date  . Diabetes mellitus   . Hyperlipidemia   . Hypertension   . S/P ablation of ventricular arrhythmia   . GERD (gastroesophageal reflux disease)   . History of kidney stones   . Coronary artery disease CARDIOLOGIST - DR Verne Carrow-  LAST VISIT 01-27-2011  IN EPIC  . Right ureteral stone   . Bilateral kidney stones   . SVT (supraventricular tachycardia)     s/p ablation per Dr. Ladona Ridgel 10 -60yrs ago  . OSA on CPAP     cpap  setting of 16  . Arthritis     DVT Prophylaxis - Xarelto Protocol Weight-Bearing as tolerated to right leg Keep foley until tomorrow. No vaccines. Will change Tegaderm at drain site tomorrow Discussed pain control Will discuss with Dr. Elissa Lovett, Dayna Barker. 07/19/2012, 7:43 AM

## 2012-07-19 NOTE — Progress Notes (Signed)
CSW consulted for SNF placement. PN reviewed. PT recommends HHPT following hospital d/c. RNCM will assist with d/c planning. CSW is available to assist with SNF placement if d/c plan changes.  Cori Razor LCSW [phone]

## 2012-07-19 NOTE — Evaluation (Signed)
Physical Therapy Evaluation Patient Details Name: [name] MRN: [id number] DOB: May 17, 1941 Today's Date: 07/19/2012 Time: 7829-5621 PT Time Calculation (min): 20 min  PT Assessment / Plan / Recommendation Clinical Impression  pt is s/p RTKA, POD # 1 today  and will benefit from PT to maximize independence for return home     PT Assessment  Patient needs continued PT services    Follow Up Recommendations  Home health PT    Does the patient have the potential to tolerate intense rehabilitation      Barriers to Discharge        Equipment Recommendations  Rolling walker with 5" wheels    Recommendations for Other Services     Frequency 7X/week    Precautions / Restrictions Precautions Precautions: Knee Required Braces or Orthoses: Knee Immobilizer - Right Knee Immobilizer - Right: Discontinue once straight leg raise with < 10 degree lag Restrictions Weight Bearing Restrictions: No   Pertinent Vitals/Pain       Mobility  Bed Mobility Bed Mobility: Supine to Sit;Sit to Supine Supine to Sit: 4: Min assist;HOB elevated Details for Bed Mobility Assistance: cues for self assist, technique Transfers Transfers: Sit to Stand;Stand to Sit Sit to Stand: 4: Min assist;With upper extremity assist;From elevated surface;From bed Stand to Sit: To chair/3-in-1;4: Min assist;With upper extremity assist Details for Transfer Assistance: cues for hand placement and RLE position/management Ambulation/Gait Ambulation/Gait Assistance: 4: Min assist Ambulation Distance (Feet): 30 Feet Assistive device: Rolling walker Ambulation/Gait Assistance Details: cues for sequence, wt shift and RW position Gait Pattern: Step-to pattern;Antalgic;Decreased stance time - right General Gait Details: UE fatigue    Exercises Total Joint Exercises Ankle Circles/Pumps: AROM;Both;5 reps Quad Sets: AROM;Right;5 reps   PT Diagnosis: Difficulty walking  PT Problem List: Decreased strength;Decreased  range of motion;Decreased activity tolerance;Decreased mobility;Decreased safety awareness;Decreased knowledge of use of DME PT Treatment Interventions: DME instruction;Gait training;Stair training;Functional mobility training;Therapeutic activities;Therapeutic exercise;Patient/family education   PT Goals Acute Rehab PT Goals PT Goal Formulation: With patient Time For Goal Achievement: 07/19/12 Potential to Achieve Goals: Good Pt will go Supine/Side to Sit: with supervision PT Goal: Supine/Side to Sit - Progress: Goal set today Pt will go Sit to Supine/Side: with min assist PT Goal: Sit to Supine/Side - Progress: Goal set today Pt will go Sit to Stand: with supervision PT Goal: Sit to Stand - Progress: Goal set today Pt will go Stand to Sit: with supervision PT Goal: Stand to Sit - Progress: Goal set today Pt will Ambulate: 51 - 150 feet;with supervision;with rolling walker PT Goal: Ambulate - Progress: Goal set today Pt will Perform Home Exercise Program: with supervision, verbal cues required/provided PT Goal: Perform Home Exercise Program - Progress: Goal set today  Visit Information  Last PT Received On: 07/19/12 Assistance Needed: +1 PT/OT Co-Evaluation/Treatment: Yes    Subjective Data  Subjective: I am just afraid of the knee hurting Patient Stated Goal: home, get ready for the other knee   Prior Functioning  Home Living Lives With: Spouse Available Help at Discharge: Family Type of Home: House Home Access: Stairs to enter Secretary/administrator of Steps: 1+1 Entrance Stairs-Rails: None Home Layout: One level Bathroom Shower/Tub: Engineer, manufacturing systems: Standard Bathroom Accessibility: Yes Home Adaptive Equipment: Paediatric nurse with back;Crutches Prior Function Level of Independence: Needs assistance Needs Assistance: Meal Prep;Light Housekeeping Meal Prep: Total Light Housekeeping: Total Able to Take Stairs?: Yes Driving: No Vocation: Part time  employment Communication Communication: No difficulties Dominant Hand: Right  Cognition  Cognition Overall Cognitive Status: Appears within functional limits for tasks assessed/performed Arousal/Alertness: Awake/alert Orientation Level: Appears intact for tasks assessed Behavior During Session: [facility] for tasks performed    Extremity/Trunk Assessment Right Lower Extremity Assessment RLE ROM/Strength/Tone: Deficits;Unable to fully assess;Due to pain RLE ROM/Strength/Tone Deficits: ankle WFL; assists minimally with SLR but good quad set, pain limiting   Balance    End of Session PT - End of Session Equipment Utilized During Treatment: Right knee immobilizer Activity Tolerance: Patient tolerated treatment well Patient left: with call bell/phone within reach;in chair;with nursing in room;with family/visitor present Nurse Communication: Mobility status  GP     [facility] 07/19/2012, 10:41 AM

## 2012-07-20 LAB — CBC
HCT: 33.5 % — ABNORMAL LOW (ref 39.0–52.0)
Hemoglobin: 11.8 g/dL — ABNORMAL LOW (ref 13.0–17.0)
MCH: 33.7 pg (ref 26.0–34.0)
MCHC: 35.2 g/dL (ref 30.0–36.0)

## 2012-07-20 NOTE — Progress Notes (Signed)
Clinical Social Work Department CLINICAL SOCIAL WORK PLACEMENT NOTE 07/20/2012  Patient:  INAKI, VANTINE  Account Number:  [id number] Admit date:  07/18/2012  Clinical Social Worker:  Cori Razor, LCSW  Date/time:  07/20/2012 03:14 PM  Clinical Social Work is seeking post-discharge placement for this patient at the following level of care:   SKILLED NURSING   (*CSW will update this form in Epic as items are completed)   07/20/2012  Patient/family provided with [facility] Department of Clinical Social Work's list of facilities offering this level of care within the geographic area requested by the patient (or if unable, by the patient's family).  07/20/2012  Patient/family informed of their freedom to choose among providers that offer the needed level of care, that participate in Medicare, Medicaid or managed care program needed by the patient, have an available bed and are willing to accept the patient.    Patient/family informed of [facility]' ownership interest in [facility], as well as of the fact that they are under no obligation to receive care at this facility.  PASARR submitted to EDS on 07/20/2012 PASARR number received from [employer] on 07/20/2012  FL2 transmitted to all facilities in geographic area requested by pt/family on  07/20/2012 FL2 transmitted to all facilities within larger geographic area on   Patient informed that his/her managed care company has contracts with or will negotiate with  certain facilities, including the following:     Patient/family informed of bed offers received:   Patient chooses bed at  Physician recommends and patient chooses bed at    Patient to be transferred to  on   Patient to be transferred to facility by   The following physician request were entered in Epic:   Additional Comments:  Cori Razor LCSW [phone]

## 2012-07-20 NOTE — Progress Notes (Signed)
Occupational Therapy Treatment Patient Details Name: [name] MRN: [id number] DOB: [birth year] Today's Date: 07/20/2012 Time: 4742-5956 OT Time Calculation (min): 41 min  OT Assessment / Plan / Recommendation Comments on Treatment Session Pt limited by pain and is not progressing towards goals. Pt now agreeable to snf.     Follow Up Recommendations  SNF    Barriers to Discharge       Equipment Recommendations  Other (comment) (wide 3n1)    Recommendations for Other Services    Frequency Min 2X/week   Plan Discharge plan needs to be updated    Precautions / Restrictions Precautions Precautions: Knee Required Braces or Orthoses: Knee Immobilizer - Right Knee Immobilizer - Right: Discontinue once straight leg raise with < 10 degree lag Restrictions Weight Bearing Restrictions: No   Pertinent Vitals/Pain Pt reported 10/10 pain in R knee even at rest and after being premedicated. Repositioned and cold applied.    ADL  Toilet Transfer: +2 Total assistance Toilet Transfer: Patient Percentage: 40% Toilet Transfer Method: Sit to stand ADL Comments: Pt had max difficulty with all mobility today requiring +2 A for sit<>stand. Each attempt varied with amt of assist pt needed. Pt has difficulty following safety cues 2* pain, often pulling or pushing on RW upon attempts to stand. Pt educated on how to use sheet as a leg lifter during bed mobility. Pt still requires at least mod A with sit>supine.    OT Diagnosis:    OT Problem List:   OT Treatment Interventions:     OT Goals ADL Goals ADL Goal: Toilet Transfer - Progress: Not progressing  Visit Information  Assistance Needed: +2 PT/OT Co-Evaluation/Treatment: Yes    Subjective Data  Subjective: I just can't do it....   Prior Functioning       Cognition  Cognition Overall Cognitive Status: Appears within functional limits for tasks assessed/performed Arousal/Alertness: Awake/alert Orientation Level: Appears intact for  tasks assessed Behavior During Session: Anxious Cognition - Other Comments: pt appears to have some intermittent confusion, possibly due to meds, various reports of the reason for his RLE pain being so bad, repeats things often and also with decreased tcall of techniques and precautions    Mobility  Bed Mobility Bed Mobility: Supine to Sit;Sit to Supine Supine to Sit: 3: Mod assist Sit to Supine: 3: Mod assist Details for Bed Mobility Assistance: cues for self assist, technique, using sheet as leg lifter Transfers Sit to Stand: 1: +2 Total assist;From bed;From chair/3-in-1;From elevated surface Sit to Stand: Patient Percentage: 40% (10%, varied, multiple attempts) Stand to Sit: 1: +2 Total assist;To chair/3-in-1;To bed;With upper extremity assist Stand to Sit: Patient Percentage: 40% Details for Transfer Assistance: cues for hand placement and RLE position/management, wt shift    Exercises     Balance     End of Session OT - End of Session Activity Tolerance: Patient limited by pain Patient left: in bed;with call bell/phone within reach;with family/visitor present  GO     Rayyan Burley A OTR/L [phone] 07/20/2012, 3:48 PM

## 2012-07-20 NOTE — Progress Notes (Signed)
SNF bed available at [facility] Thursday if pt is ready for d/c. CSW will follow to assist with d/c planning to SNF.  Cori Razor LCSW [phone]

## 2012-07-20 NOTE — Progress Notes (Signed)
Physical Therapy Treatment Patient Details Name: [name] MRN: [id number] DOB: July 25, 1940 Today's Date: 07/20/2012 Time: 1136-1202 PT Time Calculation (min): 26 min  PT Assessment / Plan / Recommendation Comments on Treatment Session  limited by pain this am; RN aware, not yet time for meds; ice to knee    Follow Up Recommendations  Home health PT;Supervision/Assistance - 24 hour;SNF (vs SNF if slow progress)     Does the patient have the potential to tolerate intense rehabilitation     Barriers to Discharge        Equipment Recommendations  Rolling walker with 5" wheels    Recommendations for Other Services    Frequency 7X/week   Plan Discharge plan remains appropriate;Frequency remains appropriate    Precautions / Restrictions Precautions Precautions: Knee Required Braces or Orthoses: Knee Immobilizer - Right Knee Immobilizer - Right: Discontinue once straight leg raise with < 10 degree lag Restrictions Weight Bearing Restrictions: No   Pertinent Vitals/Pain     Mobility  Bed Mobility Bed Mobility: Sit to Supine Supine to Sit: 4: Min assist;3: Mod assist;HOB elevated Details for Bed Mobility Assistance: cues for self assist, technique Transfers Transfers: Sit to Stand;Stand to Sit Sit to Stand: 4: Min assist;With upper extremity assist;From bed;From elevated surface Stand to Sit: 4: Min assist;3: Mod assist;To chair/3-in-1;With upper extremity assist Details for Transfer Assistance: cues for hand placement and RLE position/management Ambulation/Gait Ambulation/Gait Assistance: 4: Min assist Ambulation Distance (Feet): 6 Feet Assistive device: Rolling walker Ambulation/Gait Assistance Details: cues for wt shift, sequence, RW safety Gait Pattern: Step-to pattern;Antalgic;Decreased stance time - right    Exercises Total Joint Exercises Ankle Circles/Pumps: AROM;Both;10 reps Quad Sets: AROM;Both;10 reps Heel Slides: AROM;10 reps;AAROM;Right   PT Diagnosis:     PT Problem List:   PT Treatment Interventions:     PT Goals Acute Rehab PT Goals Time For Goal Achievement: 07/19/12 Potential to Achieve Goals: Good Pt will go Supine/Side to Sit: with supervision PT Goal: Supine/Side to Sit - Progress: Progressing toward goal Pt will go Sit to Stand: with supervision PT Goal: Sit to Stand - Progress: Progressing toward goal Pt will go Stand to Sit: with supervision PT Goal: Stand to Sit - Progress: Progressing toward goal Pt will Ambulate: 51 - 150 feet;with supervision;with rolling walker PT Goal: Ambulate - Progress: Not progressing Pt will Perform Home Exercise Program: with supervision, verbal cues required/provided PT Goal: Perform Home Exercise Program - Progress: Progressing toward goal  Visit Information  Last PT Received On: 07/20/12 Assistance Needed: +1    Subjective Data  Subjective: I am pretty good   Cognition  Cognition Overall Cognitive Status: Appears within functional limits for tasks assessed/performed Arousal/Alertness: Awake/alert Orientation Level: Appears intact for tasks assessed Behavior During Session: [facility] for tasks performed    Balance     End of Session PT - End of Session Equipment Utilized During Treatment: Right knee immobilizer Activity Tolerance: Patient limited by pain Patient left: in chair;with call bell/phone within reach Nurse Communication: Patient requests pain meds   GP     [facility] 07/20/2012, 1:03 PM

## 2012-07-20 NOTE — Progress Notes (Signed)
Subjective: 2 Days Post-Op Procedure(s) (LRB): TOTAL KNEE ARTHROPLASTY (Right) Patient reports pain as  Moderate. Much improved from yesterday afternoon and better controlled with increased dose of Percocet and switch from Morphine to Dilaudid.    Patient has complaints of right knee pain. Denies calf pain, does note some pain into anterior proximal lower leg  Objective: Vital signs in last 24 hours: Temp:  [97.8 F (36.6 C)-100.2 F (37.9 C)] 98.8 F (37.1 C) (02/05 0748) Pulse Rate:  [66-76] 76  (02/05 0748) Resp:  [14-16] 14  (02/05 0748) BP: (121-162)/(52-78) 155/78 mmHg (02/05 0748) SpO2:  [90 %-95 %] 94 % (02/05 0748)  Intake/Output from previous day:  Intake/Output Summary (Last 24 hours) at 07/20/12 0819 Last data filed at 07/20/12 0500  Gross per 24 hour  Intake    680 ml  Output   1050 ml  Net   -370 ml    Intake/Output this shift:    Labs: Results for orders placed during the hospital encounter of 07/18/12  TYPE AND SCREEN      Component Value Range   ABO/RH(D) O POS     Antibody Screen NEG     Sample Expiration 07/21/2012    GLUCOSE, CAPILLARY      Component Value Range   Glucose-Capillary 114 (*) 70 - 99 mg/dL   Comment 1 Documented in Chart    ABO/RH      Component Value Range   ABO/RH(D) O POS    GLUCOSE, CAPILLARY      Component Value Range   Glucose-Capillary 90  70 - 99 mg/dL  GLUCOSE, CAPILLARY      Component Value Range   Glucose-Capillary 114 (*) 70 - 99 mg/dL   Comment 1 Notify RN     Comment 2 Documented in Chart    CBC      Component Value Range   WBC 11.7 (*) 4.0 - 10.5 K/uL   RBC 3.62 (*) 4.22 - 5.81 MIL/uL   Hemoglobin 12.3 (*) 13.0 - 17.0 g/dL   HCT 19.1 (*) 47.8 - 29.5 %   MCV 96.1  78.0 - 100.0 fL   MCH 34.0  26.0 - 34.0 pg   MCHC 35.3  30.0 - 36.0 g/dL   RDW 62.1  30.8 - 65.7 %   Platelets 170  150 - 400 K/uL  BASIC METABOLIC PANEL      Component Value Range   Sodium 140  135 - 145 mEq/L   Potassium 3.8  3.5 - 5.1 mEq/L    Chloride 106  96 - 112 mEq/L   CO2 27  19 - 32 mEq/L   Glucose, Bld 71  70 - 99 mg/dL   BUN 16  6 - 23 mg/dL   Creatinine, Ser 8.46  0.50 - 1.35 mg/dL   Calcium 8.4  8.4 - 96.2 mg/dL   GFR calc non Af Amer 65 (*) >90 mL/min   GFR calc Af Amer 75 (*) >90 mL/min  HEMOGLOBIN A1C      Component Value Range   Hemoglobin A1C 6.8 (*) <5.7 %   Mean Plasma Glucose 148 (*) <117 mg/dL  GLUCOSE, CAPILLARY      Component Value Range   Glucose-Capillary 120 (*) 70 - 99 mg/dL  GLUCOSE, CAPILLARY      Component Value Range   Glucose-Capillary 151 (*) 70 - 99 mg/dL   Comment 1 Notify RN    GLUCOSE, CAPILLARY      Component Value Range   Glucose-Capillary 76  70 - 99 mg/dL  CBC      Component Value Range   WBC 11.1 (*) 4.0 - 10.5 K/uL   RBC 3.50 (*) 4.22 - 5.81 MIL/uL   Hemoglobin 11.8 (*) 13.0 - 17.0 g/dL   HCT 47.8 (*) 29.5 - 62.1 %   MCV 95.7  78.0 - 100.0 fL   MCH 33.7  26.0 - 34.0 pg   MCHC 35.2  30.0 - 36.0 g/dL   RDW 30.8  65.7 - 84.6 %   Platelets 161  150 - 400 K/uL  GLUCOSE, CAPILLARY      Component Value Range   Glucose-Capillary 137 (*) 70 - 99 mg/dL  GLUCOSE, CAPILLARY      Component Value Range   Glucose-Capillary 101 (*) 70 - 99 mg/dL  GLUCOSE, CAPILLARY      Component Value Range   Glucose-Capillary 114 (*) 70 - 99 mg/dL  GLUCOSE, CAPILLARY      Component Value Range   Glucose-Capillary 127 (*) 70 - 99 mg/dL    Exam - Neurologically intact Neurovascular intact Sensation intact distally Intact pulses distally Dorsiflexion/Plantar flexion intact Incision: dressing C/D/I and minimal dried bloody drainage under aquacel No cellulitis present Compartment soft no calf pain, negative Homan's Dressing/Incision - clean, dry. Tegaderm changed over drain site Motor function intact - moving foot and toes well on exam. Pedal pulses 2+  Assessment/Plan: 2 Days Post-Op Procedure(s) (LRB): TOTAL KNEE ARTHROPLASTY (Right)  Advance diet Up with therapy Plan for  discharge tomorrow Past Medical History  Diagnosis Date  . Diabetes mellitus   . Hyperlipidemia   . Hypertension   . S/P ablation of ventricular arrhythmia   . GERD (gastroesophageal reflux disease)   . History of kidney stones   . Coronary artery disease CARDIOLOGIST - DR Verne Carrow-  LAST VISIT 01-27-2011  IN [facility]  . Right ureteral stone   . Bilateral kidney stones   . SVT (supraventricular tachycardia)     s/p ablation per Dr. Ladona Ridgel 10 -79yrs ago  . OSA on CPAP     cpap setting of 16  . Arthritis     DVT Prophylaxis - Xarelto Protocol Weight-Bearing as tolerated to R leg Attempt to D/C dilaudid today and switch to PO meds (percocet 7.5mg ) if pain controlled Plan D/C home with home health tomorrow Will discuss with Dr. Elissa Lovett, Dayna Barker. 07/20/2012, 8:20 AM

## 2012-07-20 NOTE — Progress Notes (Signed)
Clinical Social Work Department BRIEF PSYCHOSOCIAL ASSESSMENT 07/20/2012  Patient:  [name], [name]     Account Number:  [id number]     Admit date:  07/18/2012  Clinical Social Worker:  Candie Chroman  Date/Time:  07/20/2012 03:05 PM  Referred by:  Physician  Date Referred:  07/20/2012 Referred for  SNF Placement   Other Referral:   Interview type:  Patient Other interview type:    PSYCHOSOCIAL DATA Living Status:  WIFE Admitted from facility:   Level of care:   Primary support name:  Tonia Primary support relationship to patient:  SPOUSE Degree of support available:   supportive    CURRENT CONCERNS Current Concerns  Post-Acute Placement   Other Concerns:    SOCIAL WORK ASSESSMENT / PLAN Pt is a 72 yr old male living t home prior to hospitalization. CSW met with pt / spouse to assist with d/c planning. Pt was hoping to go home following hospital d/c but will need ST SNF placement. SNF search has been initiated and bed offers to be provided as received.   Assessment/plan status:  Psychosocial Support/Ongoing Assessment of Needs Other assessment/ plan:   Information/referral to community resources:   SNF list provided and insurance coverage reviewed.    PATIENT'S/FAMILY'S RESPONSE TO PLAN OF CARE: Pt is disappointed that he is unable to return home but is willing to accept ST SNF placement.    Cori Razor LCSW [phone]

## 2012-07-20 NOTE — Progress Notes (Signed)
07/20/2012 Megean Fabio BSN RN CCM [phone] CM spoke with patient and spouse. Plans are for patient to go to SNF rehab upon discharge. CSW following patient. CMsigning off.

## 2012-07-20 NOTE — Progress Notes (Signed)
07/20/12 1412  PT Visit Information  Last PT Received On 07/20/12  Assistance Needed +2  PT/OT Co-Evaluation/Treatment Yes  PT Time Calculation  PT Start Time 1323  PT Stop Time 1404  PT Time Calculation (min) 41 min  Subjective Data  Subjective It was an emergency, I had to go back to bed  Patient Stated Goal possible rehab  Precautions  Precautions Knee  Required Braces or Orthoses Knee Immobilizer - Right  Knee Immobilizer - Right Discontinue once straight leg raise with < 10 degree lag  Restrictions  Weight Bearing Restrictions No  Cognition  Overall Cognitive Status Appears within functional limits for tasks assessed/performed  Arousal/Alertness Awake/alert  Orientation Level Appears intact for tasks assessed  Behavior During Session Anxious  Cognition - Other Comments pt appears to have some intermittent confusion, possibly due to meds, various reports of the reason for his RLE pain being so bad, repeats things often and also with decreased tcall of techniques and precautions  Bed Mobility  Bed Mobility Supine to Sit;Sit to Supine  Supine to Sit 3: Mod assist  Sit to Supine 3: Mod assist  Details for Bed Mobility Assistance cues for self assist, technique, using sheet as leg lifter  Transfers  Transfers Sit to Stand;Stand to Sit;Stand Pivot Transfers  Sit to Stand 1: +2 Total assist;From bed;From chair/3-in-1;From elevated surface  Sit to Stand: Patient Percentage 40% (10%, varied, multiple attempts)  Stand to Sit 1: +2 Total assist;To chair/3-in-1;To bed;With upper extremity assist  Stand to Sit: Patient Percentage 40%  Stand Pivot Transfers 1: +2 Total assist  Stand Pivot Transfers: Patient Percentage 50%  Details for Transfer Assistance cues for hand placement and RLE position/management, wt shift  Ambulation/Gait  Ambulation/Gait Assistance 1: +2 Total assist  Ambulation/Gait: Patient Percentage 60%  Ambulation Distance (Feet) 15 Feet  Assistive device Rolling  walker  Ambulation/Gait Assistance Details cues for sequence, RW safety and use of UEs  Gait Pattern Step-to pattern;Antalgic;Decreased stance time - right  Gait velocity slow  General Gait Details UE fatigue  PT - End of Session  Equipment Utilized During Treatment Right knee immobilizer  Activity Tolerance Patient limited by pain;Patient limited by fatigue  Patient left in bed;with call bell/phone within reach;with family/visitor present  PT - Assessment/Plan  Comments on Treatment Session pt continues to have slow progress, discussed with pt and wife and they are areeable to SNF; pt  requiring more assist today and wife is unable to provide this level of assist at home  PT Plan Discharge plan remains appropriate;Frequency remains appropriate  PT Frequency 7X/week  Follow Up Recommendations SNF;Supervision/Assistance - 24 hour  PT equipment Rolling walker with 5" wheels  Acute Rehab PT Goals  Time For Goal Achievement 07/22/12  Potential to Achieve Goals Good  PT General Charges  $$ ACUTE PT VISIT 1 Procedure  PT Treatments  $Therapeutic Activity 38-52 mins

## 2012-07-21 DIAGNOSIS — M1711 Unilateral primary osteoarthritis, right knee: Secondary | ICD-10-CM

## 2012-07-21 LAB — CBC
MCH: 33.7 pg (ref 26.0–34.0)
MCV: 95.8 fL (ref 78.0–100.0)
Platelets: 163 10*3/uL (ref 150–400)
RBC: 3.32 MIL/uL — ABNORMAL LOW (ref 4.22–5.81)
RDW: 12.4 % (ref 11.5–15.5)

## 2012-07-21 LAB — GLUCOSE, CAPILLARY: Glucose-Capillary: 137 mg/dL — ABNORMAL HIGH (ref 70–99)

## 2012-07-21 NOTE — Progress Notes (Signed)
Subjective: 3 Days Post-Op Procedure(s) (LRB): TOTAL KNEE ARTHROPLASTY (Right) Patient reports pain as moderate.  Denies calf pain, numbness, tingling. Pain is controlled with Percocet 7.5mg  prn. Does not feel he would be able to go home, and he and his wife have decided along with PT's recommendations that he will be safer going to SNF upon D/C.  Objective: Vital signs in last 24 hours: Temp:  [98.4 F (36.9 C)-100.7 F (38.2 C)] 98.9 F (37.2 C) (02/06 0650) Pulse Rate:  [67-75] 67  (02/06 0650) Resp:  [16-18] 18  (02/06 0800) BP: (158-168)/(62-71) 158/71 mmHg (02/06 0650) SpO2:  [93 %-94 %] 93 % (02/06 0650)  Intake/Output from previous day: 02/05 0701 - 02/06 0700 In: 1200 [P.O.:1200] Out: 1475 [Urine:1475] Intake/Output this shift: Total I/O In: 240 [P.O.:240] Out: -    Basename 07/21/12 0427 07/20/12 0405 07/19/12 0421  HGB 11.2* 11.8* 12.3*    Basename 07/21/12 0427 07/20/12 0405  WBC 11.3* 11.1*  RBC 3.32* 3.50*  HCT 31.8* 33.5*  PLT 163 161    Basename 07/19/12 0421  NA 140  K 3.8  CL 106  CO2 27  BUN 16  CREATININE 1.12  GLUCOSE 71  CALCIUM 8.4   No results found for this basename: LABPT:2,INR:2 in the last 72 hours  Neurologically intact ABD soft Neurovascular intact Sensation intact distally Intact pulses distally Dorsiflexion/Plantar flexion intact Incision: dressing C/D/I and no drainage No cellulitis present Compartment soft No calf pain. Negative Homan's, no sign of DVT  Assessment/Plan: 3 Days Post-Op Procedure(s) (LRB): TOTAL KNEE ARTHROPLASTY (Right) Advance diet Up with therapy Discharge to SNF today after PT Reviewed D/C instructions Follow up in office in 10-14 days for staple removal Discussed with Dr. Elissa Lovett, Dayna Barker. 07/21/2012, 11:43 AM

## 2012-07-21 NOTE — Discharge Summary (Signed)
Physician Discharge Summary   Patient ID: [name] MRN: [id number] DOB/AGE: 71-Jan-1942 71 y.o.  Admit date: 07/18/2012 Discharge date: 07/22/11  Primary Diagnosis: DJD R knee  Admission Diagnoses:  Past Medical History  Diagnosis Date  . Diabetes mellitus   . Hyperlipidemia   . Hypertension   . S/P ablation of ventricular arrhythmia   . GERD (gastroesophageal reflux disease)   . History of kidney stones   . Coronary artery disease CARDIOLOGIST - DR Verne Carrow-  LAST VISIT 01-27-2011  IN [facility]  . Right ureteral stone   . Bilateral kidney stones   . SVT (supraventricular tachycardia)     s/p ablation per Dr. Ladona Ridgel 10 -63yrs ago  . OSA on CPAP     cpap setting of 16  . Arthritis    Discharge Diagnoses:   Principal Problem:  *Right knee DJD  Estimated Body mass index is 33.74 kg/(m^2) as calculated from the following:   Height as of this encounter: 6\' 6" (1.981 m).   Weight as of this encounter: 292 lb(132.45 kg).  Classification of overweight in adults according to BMI (WHO, 1998)   Procedure:  Procedure(s) (LRB): TOTAL KNEE ARTHROPLASTY (Right)   Consults: None  HPI: see H&P. Chronic R knee pain due to DJD recently worsening, unable to tolerate ADLs, refractory to conservative tx. Laboratory Data: Admission on 07/18/2012  Component Date Value Range Status  . ABO/RH(D) 07/18/2012 O POS   Final  . Antibody Screen 07/18/2012 NEG   Final  . Sample Expiration 07/18/2012 07/21/2012   Final  . Glucose-Capillary 07/18/2012 114* 70 - 99 mg/dL Final  . Comment 1 82/95/6213 Documented in Chart   Final  . ABO/RH(D) 07/18/2012 O POS   Final  . Glucose-Capillary 07/18/2012 90  70 - 99 mg/dL Final  . Glucose-Capillary 07/18/2012 114* 70 - 99 mg/dL Final  . Comment 1 08/65/7846 Notify RN   Final  . Comment 2 07/18/2012 Documented in Chart   Final  . WBC 07/19/2012 11.7* 4.0 - 10.5 K/uL Final  . RBC 07/19/2012 3.62* 4.22 - 5.81 MIL/uL Final  . Hemoglobin 07/19/2012  12.3* 13.0 - 17.0 g/dL Final  . HCT 96/29/5284 34.8* 39.0 - 52.0 % Final  . MCV 07/19/2012 96.1  78.0 - 100.0 fL Final  . MCH 07/19/2012 34.0  26.0 - 34.0 pg Final  . MCHC 07/19/2012 35.3  30.0 - 36.0 g/dL Final  . RDW 13/24/4010 12.5  11.5 - 15.5 % Final  . Platelets 07/19/2012 170  150 - 400 K/uL Final  . Sodium 07/19/2012 140  135 - 145 mEq/L Final  . Potassium 07/19/2012 3.8  3.5 - 5.1 mEq/L Final  . Chloride 07/19/2012 106  96 - 112 mEq/L Final  . CO2 07/19/2012 27  19 - 32 mEq/L Final  . Glucose, Bld 07/19/2012 71  70 - 99 mg/dL Final  . BUN 27/25/3664 16  6 - 23 mg/dL Final  . Creatinine, Ser 07/19/2012 1.12  0.50 - 1.35 mg/dL Final  . Calcium 40/34/7425 8.4  8.4 - 10.5 mg/dL Final  . GFR calc non Af Amer 07/19/2012 65* >90 mL/min Final  . GFR calc Af Amer 07/19/2012 75* >90 mL/min Final   Comment:                                 The eGFR has been calculated  using the CKD EPI equation.                          This calculation has not been                          validated in all clinical                          situations.                          eGFR's persistently                          <90 mL/min signify                          possible Chronic Kidney Disease.  Marland Kitchen Hemoglobin A1C 07/18/2012 6.8* <5.7 % Final   Comment: (NOTE)                                                                                                                         According to the ADA Clinical Practice Recommendations for 2011, when                          HbA1c is used as a screening test:                           >=6.5%   Diagnostic of Diabetes Mellitus                                    (if abnormal result is confirmed)                          5.7-6.4%   Increased risk of developing Diabetes Mellitus                          References:Diagnosis and Classification of Diabetes Mellitus,Diabetes                          Care,2011,34(Suppl 1):S62-S69 and  Standards of Medical Care in                                  Diabetes - 2011,Diabetes Care,2011,34 (Suppl 1):S11-S61.  . Mean Plasma Glucose 07/18/2012 148* <117 mg/dL Final  . Glucose-Capillary 07/18/2012 120* 70 - 99 mg/dL Final  . Glucose-Capillary 07/18/2012 151* 70 - 99 mg/dL Final  . Comment 1 16/03/9603 Notify RN   Final  .  Glucose-Capillary 07/19/2012 76  70 - 99 mg/dL Final  . WBC 81/19/1478 11.1* 4.0 - 10.5 K/uL Final  . RBC 07/20/2012 3.50* 4.22 - 5.81 MIL/uL Final  . Hemoglobin 07/20/2012 11.8* 13.0 - 17.0 g/dL Final  . HCT 29/56/2130 33.5* 39.0 - 52.0 % Final  . MCV 07/20/2012 95.7  78.0 - 100.0 fL Final  . MCH 07/20/2012 33.7  26.0 - 34.0 pg Final  . MCHC 07/20/2012 35.2  30.0 - 36.0 g/dL Final  . RDW 86/57/8469 12.7  11.5 - 15.5 % Final  . Platelets 07/20/2012 161  150 - 400 K/uL Final  . WBC 07/21/2012 11.3* 4.0 - 10.5 K/uL Final  . RBC 07/21/2012 3.32* 4.22 - 5.81 MIL/uL Final  . Hemoglobin 07/21/2012 11.2* 13.0 - 17.0 g/dL Final  . HCT 62/95/2841 31.8* 39.0 - 52.0 % Final  . MCV 07/21/2012 95.8  78.0 - 100.0 fL Final  . MCH 07/21/2012 33.7  26.0 - 34.0 pg Final  . MCHC 07/21/2012 35.2  30.0 - 36.0 g/dL Final  . RDW 32/44/0102 12.4  11.5 - 15.5 % Final  . Platelets 07/21/2012 163  150 - 400 K/uL Final  . Glucose-Capillary 07/19/2012 137* 70 - 99 mg/dL Final  . Glucose-Capillary 07/19/2012 101* 70 - 99 mg/dL Final  . Glucose-Capillary 07/19/2012 114* 70 - 99 mg/dL Final  . Glucose-Capillary 07/20/2012 127* 70 - 99 mg/dL Final  . Glucose-Capillary 07/20/2012 142* 70 - 99 mg/dL Final  . Glucose-Capillary 07/20/2012 153* 70 - 99 mg/dL Final  . Glucose-Capillary 07/20/2012 90  70 - 99 mg/dL Final  . Glucose-Capillary 07/21/2012 105* 70 - 99 mg/dL Final  . Comment 1 72/53/6644 Notify RN   Final  . Comment 2 07/21/2012 Documented in Chart   Final  . Glucose-Capillary 07/21/2012 137* 70 - 99 mg/dL Final  . Comment 1 03/47/4259 Documented in Chart   Final  . Comment 2  07/21/2012 Notify RN   Final  Hospital Outpatient Visit on 07/08/2012  Component Date Value Range Status  . Sodium 07/08/2012 142  135 - 145 mEq/L Final  . Potassium 07/08/2012 4.7  3.5 - 5.1 mEq/L Final  . Chloride 07/08/2012 106  96 - 112 mEq/L Final  . CO2 07/08/2012 25  19 - 32 mEq/L Final  . Glucose, Bld 07/08/2012 160* 70 - 99 mg/dL Final  . BUN 56/38/7564 26* 6 - 23 mg/dL Final  . Creatinine, Ser 07/08/2012 1.12  0.50 - 1.35 mg/dL Final  . Calcium 33/29/5188 9.1  8.4 - 10.5 mg/dL Final  . GFR calc non Af Amer 07/08/2012 65* >90 mL/min Final  . GFR calc Af Amer 07/08/2012 75* >90 mL/min Final   Comment:                                 The eGFR has been calculated                          using the CKD EPI equation.                          This calculation has not been                          validated in all clinical  situations.                          eGFR's persistently                          <90 mL/min signify                          possible Chronic Kidney Disease.  . WBC 07/08/2012 8.9  4.0 - 10.5 K/uL Final  . RBC 07/08/2012 4.29  4.22 - 5.81 MIL/uL Final  . Hemoglobin 07/08/2012 14.1  13.0 - 17.0 g/dL Final  . HCT 16/03/9603 42.1  39.0 - 52.0 % Final  . MCV 07/08/2012 98.1  78.0 - 100.0 fL Final  . MCH 07/08/2012 32.9  26.0 - 34.0 pg Final  . MCHC 07/08/2012 33.5  30.0 - 36.0 g/dL Final  . RDW 54/02/8118 12.2  11.5 - 15.5 % Final  . Platelets 07/08/2012 246  150 - 400 K/uL Final  . Prothrombin Time 07/08/2012 13.4  11.6 - 15.2 seconds Final  . INR 07/08/2012 1.03  0.00 - 1.49 Final  . Color, Urine 07/08/2012 YELLOW  YELLOW Final  . APPearance 07/08/2012 CLEAR  CLEAR Final  . Specific Gravity, Urine 07/08/2012 1.025  1.005 - 1.030 Final  . pH 07/08/2012 5.0  5.0 - 8.0 Final  . Glucose, UA 07/08/2012 NEGATIVE  NEGATIVE mg/dL Final  . Hgb urine dipstick 07/08/2012 NEGATIVE  NEGATIVE Final  . Bilirubin Urine 07/08/2012 NEGATIVE  NEGATIVE  Final  . Ketones, ur 07/08/2012 NEGATIVE  NEGATIVE mg/dL Final  . Protein, ur 14/78/2956 NEGATIVE  NEGATIVE mg/dL Final  . Urobilinogen, UA 07/08/2012 0.2  0.0 - 1.0 mg/dL Final  . Nitrite 21/30/8657 NEGATIVE  NEGATIVE Final  . Leukocytes, UA 07/08/2012 NEGATIVE  NEGATIVE Final   MICROSCOPIC NOT DONE ON URINES WITH NEGATIVE PROTEIN, BLOOD, LEUKOCYTES, NITRITE, OR GLUCOSE <1000 mg/dL.  Marland Kitchen aPTT 07/08/2012 32  24 - 37 seconds Final  . MRSA, PCR 07/08/2012 NEGATIVE  NEGATIVE Final  . Staphylococcus aureus 07/08/2012 NEGATIVE  NEGATIVE Final   Comment:                                 The Xpert SA Assay (FDA                          approved for NASAL specimens                          in patients over 26 years of age),                          is one component of                          a comprehensive surveillance                          program.  Test performance has                          been validated by [employer]  [facility] for patients greater                          than or equal to 81 year old.                          It is not intended                          to diagnose infection nor to                          guide or monitor treatment.     X-Rays:Dg Knee 1-2 Views Right  07/08/2012  *RADIOLOGY REPORT*  Clinical Data: Preop for total knee replacement.  RIGHT KNEE - 1-2 VIEW  Comparison: 05/18/2012  Findings: Redemonstration of mild to moderate medial and patellofemoral compartment osteoarthritis.  Possible small intra- articular loose bodies projecting about the lateral tibial spine on the AP view.  Probable small suprapatellar joint effusion.  IMPRESSION: Similar osteoarthritis with probable small suprapatellar joint effusion.   Original Report Authenticated By: Jeronimo Greaves, M.D.    Dg Knee Right Port  07/18/2012  *RADIOLOGY REPORT*  Clinical Data: Post right total knee arthroplasty  PORTABLE RIGHT KNEE - 1-2 VIEW  Comparison: Portable exam 1718 hours  compared to 07/08/2012  Findings: Components of right knee prosthesis identified in expected positions. No fracture, dislocation or periprosthetic lucency. Anterior skin clips, surgical drain and postsurgical soft tissue changes noted.  IMPRESSION: Right knee prosthesis without acute abnormalities.   Original Report Authenticated By: Ulyses Southward, M.D.     EKG: Orders placed in visit on 06/28/12  . EKG 12-LEAD     Hospital Course: [name] is a 72 y.o. who was admitted to [facility]. They were brought to the operating room on 07/18/2012 and underwent Procedure(s): TOTAL KNEE ARTHROPLASTY.  Patient tolerated the procedure well and was later transferred to the recovery room and then to the orthopaedic floor for postoperative care.  They were given PO and IV analgesics for pain control following their surgery.  They were given 24 hours of postoperative antibiotics of  Anti-infectives     Start     Dose/Rate Route Frequency Ordered Stop   07/19/12 0100   vancomycin (VANCOCIN) 1,500 mg in sodium chloride 0.9 % 500 mL IVPB        1,500 mg 250 mL/hr over 120 Minutes Intravenous  Once 07/18/12 1826 07/19/12 0316   07/18/12 2100   ceFAZolin (ANCEF) 3 g in dextrose 5 % 50 mL IVPB  Status:  Discontinued        3 g 160 mL/hr over 30 Minutes Intravenous 3 times per day 07/18/12 1806 07/18/12 1823   07/18/12 1411   polymyxin B 500,000 Units, bacitracin 50,000 Units in sodium chloride irrigation 0.9 % 500 mL irrigation  Status:  Discontinued          As needed 07/18/12 1412 07/18/12 1606   07/18/12 1345   vancomycin (VANCOCIN) 500 mg in sodium chloride 0.9 % 100 mL IVPB  Status:  Discontinued        500 mg 100 mL/hr over 60 Minutes Intravenous  Once 07/18/12 1336 07/18/12 1749   07/18/12 0600   vancomycin (VANCOCIN) 1,500 mg in sodium chloride 0.9 % 500 mL IVPB     Comments: On call to OR  1,500 mg 250 mL/hr over 120 Minutes Intravenous On call to O.R. 07/17/12 1352 07/18/12 1330    07/18/12 0600   ceFAZolin (ANCEF) 3 g in dextrose 5 % 50 mL IVPB  Status:  Discontinued        3 g 160 mL/hr over 30 Minutes Intravenous On call to O.R. 07/17/12 1352 07/18/12 1749         and started on DVT prophylaxis in the form of Xarelto.   PT and OT were ordered for total joint protocol.  Discharge planning consulted to help with postop disposition and equipment needs.  Patient had a rough night on the evening of surgery and started to get up OOB with therapy on day one. Hemovac drain was pulled without difficulty.  Continued to work with therapy into day two.  Dressing from the drain site was changed on day two and the incision was clean and dry, Aquacel dressing in place over TKA incision.  By day three, the patient had progressed with therapy and meeting their goals.  Incision was healing well.  Patient was seen in rounds and was ready to go home.   Discharge Medications: Prior to Admission medications   Medication Sig Start Date End Date Taking? Authorizing Provider  atorvastatin (LIPITOR) 20 MG tablet Take 20 mg by mouth at bedtime.   Yes Historical Provider, MD  doxazosin (CARDURA) 2 MG tablet Take 2 mg by mouth daily before breakfast.    Yes Historical Provider, MD  lisinopril (PRINIVIL,ZESTRIL) 10 MG tablet Take 10 mg by mouth every evening.   Yes Historical Provider, MD  metFORMIN (GLUCOPHAGE) 1000 MG tablet Take 1,000 mg by mouth 2 (two) times daily with a meal.   Yes Historical Provider, MD  metoprolol (LOPRESSOR) 100 MG tablet Take 100-200 mg by mouth 2 (two) times daily. Takes 2 tablets at breakfast and 1 tablet at night   Yes Historical Provider, MD  NOVOLIN 70/30 RELION (70-30) 100 UNIT/ML injection Inject 60-90 Units into the skin 2 (two) times daily with a meal. Takes 60 units in the morning and 90 units at night 02/22/12  Yes Historical Provider, MD  omeprazole (PRILOSEC) 20 MG capsule Take 40 mg by mouth at bedtime.    Yes Historical Provider, MD  simvastatin (ZOCOR) 40 MG  tablet Take 40 mg by mouth at bedtime.    Yes Historical Provider, MD  furosemide (LASIX) 20 MG tablet Take 20 mg by mouth daily before breakfast.  06/24/12   Historical Provider, MD  methocarbamol (ROBAXIN) 500 MG tablet Take 1 tablet (500 mg total) by mouth 3 (three) times daily between meals as needed. 07/18/12   Javier Docker, MD  oxyCODONE-acetaminophen (PERCOCET) 7.5-325 MG per tablet Take 1 tablet by mouth every 4 (four) hours as needed for pain. 07/18/12   Javier Docker, MD  rivaroxaban (XARELTO) 10 MG TABS tablet Take 1 tablet (10 mg total) by mouth daily. 07/18/12   Javier Docker, MD    Diet: Diabetic diet Activity:WBAT Follow-up:in 10-14 days [facility]: [facility]   Discharge Orders    Future Orders Please Complete By Expires   Diet - low sodium heart healthy      Call MD / Call 911      Comments:   If you experience chest pain or shortness of breath, CALL 911 and be transported to the hospital emergency room.  If you develope a fever above 101 F, pus (white drainage) or increased drainage  or redness at the wound, or calf pain, call your surgeon's office.   Constipation Prevention      Comments:   Drink plenty of fluids.  Prune juice may be helpful.  You may use a stool softener, such as Colace (over the counter) 100 mg twice a day.  Use MiraLax (over the counter) for constipation as needed.   Increase activity slowly as tolerated          Medication List     As of 07/21/2012  1:58 PM    STOP taking these medications         aspirin 81 MG tablet      CENTRUM SILVER ULTRA MENS PO      HYDROcodone-acetaminophen 5-325 MG per tablet   Commonly known as: NORCO/VICODIN      PRESERVISION AREDS 2 Caps      TAKE these medications         atorvastatin 20 MG tablet   Commonly known as: LIPITOR   Take 20 mg by mouth at bedtime.      doxazosin 2 MG tablet   Commonly known as: CARDURA   Take 2 mg by mouth daily  before breakfast.      furosemide 20 MG tablet   Commonly known as: LASIX   Take 20 mg by mouth daily before breakfast.      lisinopril 10 MG tablet   Commonly known as: PRINIVIL,ZESTRIL   Take 10 mg by mouth every evening.      metFORMIN 1000 MG tablet   Commonly known as: GLUCOPHAGE   Take 1,000 mg by mouth 2 (two) times daily with a meal.      methocarbamol 500 MG tablet   Commonly known as: ROBAXIN   Take 1 tablet (500 mg total) by mouth 3 (three) times daily between meals as needed.      metoprolol 100 MG tablet   Commonly known as: LOPRESSOR   Take 100-200 mg by mouth 2 (two) times daily. Takes 2 tablets at breakfast and 1 tablet at night      NOVOLIN 70/30 RELION (70-30) 100 UNIT/ML injection   Generic drug: insulin NPH-insulin regular   Inject 60-90 Units into the skin 2 (two) times daily with a meal. Takes 60 units in the morning and 90 units at night      omeprazole 20 MG capsule   Commonly known as: PRILOSEC   Take 40 mg by mouth at bedtime.      oxyCODONE-acetaminophen 7.5-325 MG per tablet   Commonly known as: PERCOCET   Take 1 tablet by mouth every 4 (four) hours as needed for pain.      rivaroxaban 10 MG Tabs tablet   Commonly known as: XARELTO   Take 1 tablet (10 mg total) by mouth daily.      simvastatin 40 MG tablet   Commonly known as: ZOCOR   Take 40 mg by mouth at bedtime.           Follow-up Information    Follow up with BEANE,JEFFREY C, MD. In 10 days.   Contact information:   [address] [place] [address] [phone]          Signed: Dorothy Spark. 07/21/2012, 1:58 PM

## 2012-07-21 NOTE — Progress Notes (Signed)
Pt to d/c to [facility]. Leaving by ambulance.  Pt showing no signs and symptoms of distress. Report given to nurse at [facility].

## 2012-07-21 NOTE — Progress Notes (Signed)
Clinical Social Work Department CLINICAL SOCIAL WORK PLACEMENT NOTE 07/21/2012  Patient:  [name], [name]  Account Number:  [id number] Admit date:  07/18/2012  Clinical Social Worker:  Cori Razor, LCSW  Date/time:  07/20/2012 03:14 PM  Clinical Social Work is seeking post-discharge placement for this patient at the following level of care:   SKILLED NURSING   (*CSW will update this form in Epic as items are completed)   07/20/2012  Patient/family provided with [facility] Department of Clinical Social Work's list of facilities offering this level of care within the geographic area requested by the patient (or if unable, by the patient's family).  07/20/2012  Patient/family informed of their freedom to choose among providers that offer the needed level of care, that participate in Medicare, Medicaid or managed care program needed by the patient, have an available bed and are willing to accept the patient.    Patient/family informed of [facility]' ownership interest in [facility], as well as of the fact that they are under no obligation to receive care at this facility.  PASARR submitted to EDS on 07/20/2012 PASARR number received from EDS on 07/20/2012  FL2 transmitted to all facilities in geographic area requested by pt/family on  07/20/2012 FL2 transmitted to all facilities within larger geographic area on   Patient informed that his/her managed care company has contracts with or will negotiate with  certain facilities, including the following:     Patient/family informed of bed offers received:  07/20/2012 Patient chooses bed at [facility] Physician recommends and patient chooses bed at    Patient to be transferred to [facility] on  07/21/2012 Patient to be transferred to facility by [facility]  The following physician request were entered in Epic:   Additional Comments:  Cori Razor LCSW [phone]

## 2012-07-21 NOTE — Progress Notes (Signed)
Physical Therapy Treatment Patient Details Name: [name] MRN: [id number] DOB: November 17, 1940 Today's Date: 07/21/2012 Time: 7829-5621 PT Time Calculation (min): 24 min  PT Assessment / Plan / Recommendation Comments on Treatment Session  Good progress today. Pt ambulated 60' with RW and min assist. R knee flexion approx 30* limited by pain. Plans to DC to [facility] today. REady to DC from PT standpoint.     Follow Up Recommendations  SNF;Supervision/Assistance - 24 hour     Does the patient have the potential to tolerate intense rehabilitation     Barriers to Discharge        Equipment Recommendations  Rolling walker with 5" wheels    Recommendations for Other Services    Frequency 7X/week   Plan Discharge plan remains appropriate;Frequency remains appropriate    Precautions / Restrictions Precautions Precautions: Knee Required Braces or Orthoses: Knee Immobilizer - Right Knee Immobilizer - Right: Discontinue once straight leg raise with < 10 degree lag Restrictions Weight Bearing Restrictions: No   Pertinent Vitals/Pain *pt denied pain at rest Premedicated, ice applied R knee after PT**    Mobility  Bed Mobility Bed Mobility: Supine to Sit;Sit to Supine Supine to Sit: 4: Min assist;HOB elevated Details for Bed Mobility Assistance: pt used leg lifter Transfers Transfers: Sit to Stand;Stand to Sit Sit to Stand: From bed;From elevated surface;4: Min assist Sit to Stand: Patient Percentage: 90% (10%, varied, multiple attempts) Stand to Sit: To chair/3-in-1;With upper extremity assist;4: Min assist;With armrests Details for Transfer Assistance: cues for hand placement and RLE position/management, wt shift Ambulation/Gait Ambulation/Gait Assistance: 4: Min guard Ambulation Distance (Feet): 60 Feet Assistive device: Rolling walker Gait Pattern: Step-to pattern;Antalgic;Decreased stance time - right Gait velocity: slow General Gait Details: UE fatigue, VCs for positioning in  RW, pt tends to step all the way to front of walker resulting in LOB    Exercises Total Joint Exercises Ankle Circles/Pumps: AROM;Both;10 reps Quad Sets: AROM;Both;10 reps Towel Squeeze: AROM;Both;10 reps Short Arc QuadBarbaraann Boys;Right;10 reps Heel Slides: 10 reps;AAROM;Right Hip ABduction/ADduction: Right;10 reps;AAROM Straight Leg Raises: AAROM;Right;10 reps   PT Diagnosis:    PT Problem List:   PT Treatment Interventions:     PT Goals Acute Rehab PT Goals PT Goal Formulation: With patient Time For Goal Achievement: 07/19/12 Potential to Achieve Goals: Good Pt will go Supine/Side to Sit: with supervision PT Goal: Supine/Side to Sit - Progress: Progressing toward goal Pt will go Sit to Supine/Side: with min assist Pt will go Sit to Stand: with supervision PT Goal: Sit to Stand - Progress: Progressing toward goal Pt will go Stand to Sit: with supervision PT Goal: Stand to Sit - Progress: Progressing toward goal Pt will Ambulate: 51 - 150 feet;with supervision;with rolling walker PT Goal: Ambulate - Progress: Progressing toward goal Pt will Perform Home Exercise Program: with supervision, verbal cues required/provided PT Goal: Perform Home Exercise Program - Progress: Progressing toward goal  Visit Information  Last PT Received On: 07/21/12 Assistance Needed: +1    Subjective Data  Subjective: I'm proud of myself, I did better today.  Patient Stated Goal: deep sea fishing   Cognition  Cognition Overall Cognitive Status: Appears within functional limits for tasks assessed/performed Arousal/Alertness: Awake/alert Orientation Level: Appears intact for tasks assessed Behavior During Session: [place] for tasks performed    Balance     End of Session PT - End of Session Equipment Utilized During Treatment: Right knee immobilizer Activity Tolerance: Patient tolerated treatment well Patient left: with call bell/phone within reach;with  family/visitor present;in chair   GP      Ralene Bathe Kistler 07/21/2012, 11:15 AM [phone]

## 2012-10-03 ENCOUNTER — Emergency Department (HOSPITAL_COMMUNITY): Payer: Medicare Other

## 2012-10-03 ENCOUNTER — Encounter (HOSPITAL_COMMUNITY): Payer: Self-pay | Admitting: Emergency Medicine

## 2012-10-03 ENCOUNTER — Observation Stay (HOSPITAL_COMMUNITY)
Admission: EM | Admit: 2012-10-03 | Discharge: 2012-10-05 | Disposition: A | Discharge status: Home / Self Care | Payer: Medicare Other | Attending: Internal Medicine | Admitting: Internal Medicine

## 2012-10-03 DIAGNOSIS — E782 Mixed hyperlipidemia: Secondary | ICD-10-CM

## 2012-10-03 DIAGNOSIS — N201 Calculus of ureter: Secondary | ICD-10-CM

## 2012-10-03 DIAGNOSIS — R0609 Other forms of dyspnea: Secondary | ICD-10-CM | POA: Insufficient documentation

## 2012-10-03 DIAGNOSIS — E785 Hyperlipidemia, unspecified: Secondary | ICD-10-CM | POA: Insufficient documentation

## 2012-10-03 DIAGNOSIS — Z9861 Coronary angioplasty status: Secondary | ICD-10-CM | POA: Insufficient documentation

## 2012-10-03 DIAGNOSIS — M1711 Unilateral primary osteoarthritis, right knee: Secondary | ICD-10-CM | POA: Diagnosis present

## 2012-10-03 DIAGNOSIS — E119 Type 2 diabetes mellitus without complications: Secondary | ICD-10-CM | POA: Insufficient documentation

## 2012-10-03 DIAGNOSIS — R0989 Other specified symptoms and signs involving the circulatory and respiratory systems: Secondary | ICD-10-CM | POA: Insufficient documentation

## 2012-10-03 DIAGNOSIS — I509 Heart failure, unspecified: Secondary | ICD-10-CM

## 2012-10-03 DIAGNOSIS — I2510 Atherosclerotic heart disease of native coronary artery without angina pectoris: Secondary | ICD-10-CM | POA: Insufficient documentation

## 2012-10-03 DIAGNOSIS — G4733 Obstructive sleep apnea (adult) (pediatric): Secondary | ICD-10-CM | POA: Insufficient documentation

## 2012-10-03 DIAGNOSIS — R079 Chest pain, unspecified: Secondary | ICD-10-CM | POA: Insufficient documentation

## 2012-10-03 DIAGNOSIS — I5033 Acute on chronic diastolic (congestive) heart failure: Principal | ICD-10-CM | POA: Insufficient documentation

## 2012-10-03 DIAGNOSIS — I10 Essential (primary) hypertension: Secondary | ICD-10-CM | POA: Insufficient documentation

## 2012-10-03 DIAGNOSIS — R5383 Other fatigue: Secondary | ICD-10-CM

## 2012-10-03 DIAGNOSIS — I471 Supraventricular tachycardia: Secondary | ICD-10-CM | POA: Diagnosis present

## 2012-10-03 DIAGNOSIS — I119 Hypertensive heart disease without heart failure: Secondary | ICD-10-CM | POA: Diagnosis present

## 2012-10-03 LAB — CBC WITH DIFFERENTIAL/PLATELET
Basophils Absolute: 0.1 10*3/uL (ref 0.0–0.1)
Basophils Relative: 1 % (ref 0–1)
Eosinophils Absolute: 0.3 10*3/uL (ref 0.0–0.7)
Eosinophils Relative: 3 % (ref 0–5)
MCH: 33.3 pg (ref 26.0–34.0)
MCHC: 35.5 g/dL (ref 30.0–36.0)
MCV: 93.6 fL (ref 78.0–100.0)
Platelets: 223 10*3/uL (ref 150–400)
RDW: 13.4 % (ref 11.5–15.5)

## 2012-10-03 LAB — COMPREHENSIVE METABOLIC PANEL
ALT: 20 U/L (ref 0–53)
AST: 17 U/L (ref 0–37)
Albumin: 3.4 g/dL — ABNORMAL LOW (ref 3.5–5.2)
Calcium: 9.3 mg/dL (ref 8.4–10.5)
GFR calc Af Amer: 75 mL/min — ABNORMAL LOW (ref >90)
Glucose, Bld: 197 mg/dL — ABNORMAL HIGH (ref 70–99)
Sodium: 143 mEq/L (ref 135–145)
Total Protein: 6.9 g/dL (ref 6.0–8.3)

## 2012-10-03 MED ORDER — IOHEXOL 350 MG/ML SOLN
100.0000 mL | Freq: Once | INTRAVENOUS | Status: AC | PRN
  Administered 2012-10-03: 100 mL via INTRAVENOUS

## 2012-10-03 NOTE — ED Notes (Signed)
Patient transported to CT 

## 2012-10-03 NOTE — ED Notes (Signed)
Pts family states his abd is more swollen than usual.

## 2012-10-03 NOTE — ED Notes (Signed)
Dr. Pollina at bedside   

## 2012-10-03 NOTE — ED Notes (Signed)
PT. REPORTS MID CHEST PAIN WITH PROGRESSING EXERTIONAL DYSPNEA / AND BILATERAL LOWER LEGS SWELLING FOR SEVERAL DAYS . DENIES NAUSEA OR DIAPHORESIS.

## 2012-10-03 NOTE — ED Notes (Signed)
Pt states he has had some increased SOB with activity, bilateral leg swelling, and intermittent central cp with no radiation, described as a squeezing, tightness, feeling. Pt denies any chest pain at this time. Pt states he has never had leg swelling before.

## 2012-10-03 NOTE — ED Provider Notes (Deleted)
History     CSN: [id number]  Arrival date & time 10/03/12  1909   First MD Initiated Contact with Patient 10/03/12 2026      Chief Complaint  Patient presents with  . Chest Pain    (Consider location/radiation/quality/duration/timing/severity/associated sxs/prior treatment) HPI Comments: Patient comes to the ER for evaluation of chest pain and difficulty breathing. Patient reports that symptoms have been present for several days. He reports progressive worsening of the symptoms. He has noticed increased swelling of both of his legs. This reports that he gets very short of breath with ambulation. He recently had knee replacement surgery.  Patient is a 72 y.o. male presenting with chest pain.  Chest Pain   Past Medical History  Diagnosis Date  . Diabetes mellitus   . Hyperlipidemia   . Hypertension   . S/P ablation of ventricular arrhythmia   . GERD (gastroesophageal reflux disease)   . History of kidney stones   . Coronary artery disease CARDIOLOGIST - DR Verne Carrow-  LAST VISIT 01-27-2011  IN [facility]  . Right ureteral stone   . Bilateral kidney stones   . SVT (supraventricular tachycardia)     s/p ablation per Dr. Ladona Ridgel 10 -55yrs ago  . OSA on CPAP     cpap setting of 16  . Arthritis     Past Surgical History  Procedure Laterality Date  . Right ureteroscopic stone extraction  09-26-2010  . Circumcision and fulgeration of condyloma  06-22-2003  . Cardiac electrophysiology study and ablation  2002  . Transthoracic echocardiogram  03-03-2011    MODERATE LVH/ LVSF NORMAL / EF 60-65%/ MILDLY DILATED LEFT ATRIUMK  . Severeal ureteroscopic stone extractions    . Ureteroscopy  11/13/2011    Procedure: URETEROSCOPY;  Surgeon: Garnett Farm, MD;  Location: [facility];  Service: Urology;  Laterality: Right;  RIGHT URETEROSCOPY WITH HOLMIUM LASER LIHTOTRIPSY DIGITAL URETEROSCOPE  . Coronary angioplasty  1994    DCA OF THE LAD  . Cardiac catheterization   04-27-2007    CAD WITH 30% NARROWING IN THE MID-LAD/ 50 % NARROWING PROXIMAL CIRCUMFLEX/ 40% NARROWING SECOND MARGINAL BRANCH/ 40-50% PROXIMAL RCA WITH DISTAL POSTERIOR NARROWING/ NORMAL LVF  . Cardiac catheterization  2000    NON-OBSTRUCTIVE CAD  . Laparoscopic cholecystectomy  03-26-2005  . Total knee arthroplasty  07/18/2012    Procedure: TOTAL KNEE ARTHROPLASTY;  Surgeon: Javier Docker, MD;  Location: [facility];  Service: Orthopedics;  Laterality: Right;    Family History  Problem Relation Age of Onset  . Heart attack Mother   . Heart attack Brother   .     Marland Kitchen Colon cancer Neg Hx   . Stomach cancer Neg Hx     History  Substance Use Topics  . Smoking status: Former Smoker -- 1.00 packs/day for 15 years    Types: Cigarettes    Quit date: 06/15/1986  . Smokeless tobacco: Never Used  . Alcohol Use: Yes     Comment: occasional      Review of Systems  Cardiovascular: Positive for chest pain.    Allergies  Codeine and Penicillins  Home Medications   Current Outpatient Rx  Name  Route  Sig  Dispense  Refill  . aspirin EC 81 MG tablet   Oral   Take 81 mg by mouth daily.         Marland Kitchen atorvastatin (LIPITOR) 20 MG tablet   Oral   Take 20 mg by mouth at bedtime.         Marland Kitchen  doxazosin (CARDURA) 2 MG tablet   Oral   Take 2 mg by mouth daily before breakfast.          . furosemide (LASIX) 20 MG tablet   Oral   Take 20 mg by mouth daily before breakfast.          . lisinopril (PRINIVIL,ZESTRIL) 10 MG tablet   Oral   Take 10 mg by mouth every evening.         . metFORMIN (GLUCOPHAGE) 1000 MG tablet   Oral   Take 1,000 mg by mouth 2 (two) times daily with a meal.         . methocarbamol (ROBAXIN) 500 MG tablet   Oral   Take 1 tablet (500 mg total) by mouth 3 (three) times daily between meals as needed.   40 tablet   1   . metoprolol (LOPRESSOR) 100 MG tablet   Oral   Take 100-200 mg by mouth 2 (two) times daily. Takes 2 tablets at breakfast and 1 tablet at  night         . Multiple Vitamin (MULTIVITAMIN WITH MINERALS) TABS   Oral   Take 1 tablet by mouth daily.         . Multiple Vitamins-Minerals (ICAPS AREDS FORMULA PO)   Oral   Take 1 capsule by mouth 2 (two) times daily.         Marland Kitchen NOVOLIN 70/30 RELION (70-30) 100 UNIT/ML injection   Subcutaneous   Inject 60-90 Units into the skin 2 (two) times daily with a meal. Takes 60 units in the morning and 90 units at night         . omeprazole (PRILOSEC) 20 MG capsule   Oral   Take 20 mg by mouth at bedtime.          Marland Kitchen oxyCODONE-acetaminophen (PERCOCET) 7.5-325 MG per tablet   Oral   Take 1 tablet by mouth every 4 (four) hours as needed for pain.   60 tablet   0     BP 181/74  Pulse 54  Temp(Src) 99.1 F (37.3 C) (Oral)  Resp 20  SpO2 96%  Physical Exam  ED Course  Procedures (including critical care time)  EKG:  Date: 10/04/2012  Rate: 68  Rhythm: normal sinus rhythm  QRS Axis: normal  Intervals: normal  ST/T Wave abnormalities: normal  Conduction Disutrbances: none  Narrative Interpretation: unremarkable      Labs Reviewed  CBC WITH DIFFERENTIAL - Abnormal; Notable for the following:    RBC 4.09 (*)    HCT 38.3 (*)    All other components within normal limits  COMPREHENSIVE METABOLIC PANEL - Abnormal; Notable for the following:    Glucose, Bld 197 (*)    Albumin 3.4 (*)    GFR calc non Af Amer 65 (*)    GFR calc Af Amer 75 (*)    All other components within normal limits  PRO B NATRIURETIC PEPTIDE - Abnormal; Notable for the following:    Pro B Natriuretic peptide (BNP) 514.8 (*)    All other components within normal limits  POCT I-STAT TROPONIN I   Dg Chest 2 View  10/03/2012  *RADIOLOGY REPORT*  Clinical Data: Chest pain  CHEST - 2 VIEW  Comparison: 11/21/2011  Findings: COPD with hyperinflation of the lungs.  Negative for pneumonia.  Negative for heart failure or effusion.  IMPRESSION: COPD.  No acute cardiopulmonary abnormality.   Original  Report Authenticated By: Janeece Riggers, M.D.  Diagnosis: Dyspnea on exertion, congestive heart failure, possible angina    MDM  Patient presents to the ER progressively worsening dyspnea on exertion. Symptoms have been present for 3 days. Patient did recently have knee surgery. He has been [place] for several weeks. Patient reports that previously had been doing his exercises without difficulty. Over last 3 days, however, he has been unable to walk across the room without becoming short of breath. Over this period of time he has noticed increased swelling of his lower extremities. Patient does have a history of congestive heart failure, takes Lasix. He has not been experiencing chest pain with the symptoms.  Patient's chest x-ray did not show any evidence of pneumonia. Patient does have mild cardiomegaly, but no obvious for congestive heart failure or edema. There is some suggestion of COPD.  Patient's BNP is 514.8. I do not have a baseline for him. Current presentation could be consistent with mild congestive heart failure. Because of the recent surgery, CT angiography was performed. No evidence of PE.  Case was discussed with Dr. Charm Barges, on-call for [facility] cardiology. He did not feel that this was clearly a primarily cardiology problem. He recommended admission to internal medicine. Patient will need further cardiac workup, diuresis and evaluation for possible pulmonary causes of his shortness of breath. Case discussed with hospitalist.        Gilda Crease, MD 10/04/12 [phone]

## 2012-10-04 DIAGNOSIS — I509 Heart failure, unspecified: Secondary | ICD-10-CM

## 2012-10-04 DIAGNOSIS — I119 Hypertensive heart disease without heart failure: Secondary | ICD-10-CM

## 2012-10-04 DIAGNOSIS — R079 Chest pain, unspecified: Secondary | ICD-10-CM

## 2012-10-04 DIAGNOSIS — I2510 Atherosclerotic heart disease of native coronary artery without angina pectoris: Secondary | ICD-10-CM

## 2012-10-04 DIAGNOSIS — I471 Supraventricular tachycardia: Secondary | ICD-10-CM | POA: Diagnosis present

## 2012-10-04 DIAGNOSIS — I517 Cardiomegaly: Secondary | ICD-10-CM

## 2012-10-04 LAB — CREATININE, SERUM
GFR calc Af Amer: 80 mL/min — ABNORMAL LOW (ref >90)
GFR calc non Af Amer: 69 mL/min — ABNORMAL LOW (ref >90)

## 2012-10-04 LAB — CBC
HCT: 37.2 % — ABNORMAL LOW (ref 39.0–52.0)
RDW: 13.3 % (ref 11.5–15.5)
WBC: 9.1 10*3/uL (ref 4.0–10.5)

## 2012-10-04 LAB — TROPONIN I
Troponin I: <0.3 ng/mL (ref <0.30)
Troponin I: <0.3 ng/mL (ref <0.30)
Troponin I: <0.3 ng/mL (ref <0.30)

## 2012-10-04 LAB — GLUCOSE, CAPILLARY
Glucose-Capillary: 124 mg/dL — ABNORMAL HIGH (ref 70–99)
Glucose-Capillary: 209 mg/dL — ABNORMAL HIGH (ref 70–99)
Glucose-Capillary: 201 mg/dL — ABNORMAL HIGH (ref 70–99)

## 2012-10-04 LAB — PRO B NATRIURETIC PEPTIDE: Pro B Natriuretic peptide (BNP): 514.8 pg/mL — ABNORMAL HIGH (ref 0–125)

## 2012-10-04 MED ORDER — SODIUM CHLORIDE 0.9 % IJ SOLN
3.0000 mL | Freq: Two times a day (BID) | INTRAMUSCULAR | Status: DC
  Administered 2012-10-04: 3 mL via INTRAVENOUS

## 2012-10-04 MED ORDER — INSULIN ASPART PROT & ASPART (70-30 MIX) 100 UNIT/ML ~~LOC~~ SUSP
60.0000 [IU] | Freq: Every day | SUBCUTANEOUS | Status: DC
  Administered 2012-10-04: 60 [IU] via SUBCUTANEOUS

## 2012-10-04 MED ORDER — SODIUM CHLORIDE 0.9 % IV SOLN: 250.0000 mL | INTRAVENOUS | Status: DC | PRN

## 2012-10-04 MED ORDER — SODIUM CHLORIDE 0.9 % IJ SOLN
3.0000 mL | Freq: Two times a day (BID) | INTRAMUSCULAR | Status: DC
  Administered 2012-10-04 – 2012-10-05 (×3): 3 mL via INTRAVENOUS

## 2012-10-04 MED ORDER — SODIUM CHLORIDE 0.9 % IJ SOLN
3.0000 mL | INTRAMUSCULAR | Status: DC | PRN
  Administered 2012-10-04: 3 mL via INTRAVENOUS

## 2012-10-04 MED ORDER — POLYETHYLENE GLYCOL 3350 17 G PO PACK
17.0000 g | PACK | Freq: Every day | ORAL | Status: DC
  Administered 2012-10-04 – 2012-10-05 (×2): 17 g via ORAL
  Filled 2012-10-04 (×2): qty 1

## 2012-10-04 MED ORDER — METOPROLOL TARTRATE 100 MG PO TABS
100.0000 mg | ORAL_TABLET | Freq: Every day | ORAL | Status: DC
  Administered 2012-10-04: 100 mg via ORAL
  Filled 2012-10-04 (×2): qty 1

## 2012-10-04 MED ORDER — ASPIRIN EC 81 MG PO TBEC
81.0000 mg | DELAYED_RELEASE_TABLET | Freq: Every day | ORAL | Status: DC
Start: 2012-10-04 — End: 2012-10-05
  Administered 2012-10-04 – 2012-10-05 (×2): 81 mg via ORAL
  Filled 2012-10-04 (×2): qty 1

## 2012-10-04 MED ORDER — METOPROLOL TARTRATE 100 MG PO TABS
200.0000 mg | ORAL_TABLET | Freq: Every day | ORAL | Status: DC
  Administered 2012-10-04 – 2012-10-05 (×2): 200 mg via ORAL
  Filled 2012-10-04 (×2): qty 2

## 2012-10-04 MED ORDER — INSULIN ASPART PROT & ASPART (70-30 MIX) 100 UNIT/ML ~~LOC~~ SUSP
30.0000 [IU] | Freq: Every day | SUBCUTANEOUS | Status: DC
  Administered 2012-10-04 – 2012-10-05 (×2): 30 [IU] via SUBCUTANEOUS
  Filled 2012-10-04: qty 10

## 2012-10-04 MED ORDER — METOPROLOL TARTRATE 100 MG PO TABS: 100.0000 mg | ORAL_TABLET | Freq: Two times a day (BID) | ORAL | Status: DC

## 2012-10-04 MED ORDER — ASPIRIN 81 MG PO CHEW
324.0000 mg | CHEWABLE_TABLET | Freq: Once | ORAL | Status: AC
  Administered 2012-10-04: 324 mg via ORAL
  Filled 2012-10-04: qty 4

## 2012-10-04 MED ORDER — FUROSEMIDE 20 MG PO TABS
20.0000 mg | ORAL_TABLET | Freq: Every day | ORAL | Status: DC
  Administered 2012-10-04: 20 mg via ORAL
  Filled 2012-10-04 (×4): qty 1

## 2012-10-04 MED ORDER — ENOXAPARIN SODIUM 40 MG/0.4ML ~~LOC~~ SOLN
40.0000 mg | SUBCUTANEOUS | Status: DC
  Administered 2012-10-04 – 2012-10-05 (×2): 40 mg via SUBCUTANEOUS
  Filled 2012-10-04 (×2): qty 0.4

## 2012-10-04 MED ORDER — LISINOPRIL 10 MG PO TABS
10.0000 mg | ORAL_TABLET | Freq: Every evening | ORAL | Status: DC
  Administered 2012-10-04: 10 mg via ORAL
  Filled 2012-10-04 (×2): qty 1

## 2012-10-04 MED ORDER — ATORVASTATIN CALCIUM 20 MG PO TABS
20.0000 mg | ORAL_TABLET | Freq: Every day | ORAL | Status: DC
  Administered 2012-10-04: 20 mg via ORAL
  Filled 2012-10-04 (×2): qty 1

## 2012-10-04 MED ORDER — PANTOPRAZOLE SODIUM 40 MG PO TBEC
40.0000 mg | DELAYED_RELEASE_TABLET | Freq: Every day | ORAL | Status: DC
  Administered 2012-10-04: 40 mg via ORAL
  Filled 2012-10-04: qty 1

## 2012-10-04 MED ORDER — FUROSEMIDE 10 MG/ML IJ SOLN
40.0000 mg | Freq: Once | INTRAMUSCULAR | Status: AC
  Administered 2012-10-04: 40 mg via INTRAVENOUS
  Filled 2012-10-04: qty 4

## 2012-10-04 MED ORDER — DOXAZOSIN MESYLATE 2 MG PO TABS
2.0000 mg | ORAL_TABLET | Freq: Every day | ORAL | Status: DC
  Administered 2012-10-04 – 2012-10-05 (×2): 2 mg via ORAL
  Filled 2012-10-04 (×3): qty 1

## 2012-10-04 MED ORDER — FUROSEMIDE 10 MG/ML IJ SOLN
40.0000 mg | Freq: Every day | INTRAMUSCULAR | Status: DC
  Administered 2012-10-04 – 2012-10-05 (×2): 40 mg via INTRAVENOUS
  Filled 2012-10-04 (×2): qty 4

## 2012-10-04 NOTE — Progress Notes (Signed)
  Echocardiogram 2D Echocardiogram has been performed.  [name] A 10/04/2012, 3:02 PM

## 2012-10-04 NOTE — Progress Notes (Signed)
Patient evaluated for community based chronic disease management services with [facility] Care Management Program as a benefit of patient's [employer].  Spoke with patient and his wife at bedside to explain [facility] Care Management services.  Patient has declined services at this time as he feels he and his wife can manage his care.   Left contact information and [facility] literature at bedside. Made inpatient Case Manager aware that [facility] Care Management consult. Of note, [facility] Care Management services does not replace or interfere with any services that are arranged by inpatient case management or social work.  For additional questions or referrals please contact Anibal Henderson BSN RN [facility] Liaison at [phone].

## 2012-10-04 NOTE — ED Notes (Signed)
Pt back in room with family

## 2012-10-04 NOTE — ED Notes (Signed)
The pt has no chest pain or sob.  He reports that his extremity swelling has gone down since he arrived

## 2012-10-04 NOTE — ED Notes (Signed)
MD at bedside. 

## 2012-10-04 NOTE — H&P (Signed)
PCP:   Lupita Raider, MD   Chief Complaint:  Chest pressure  HPI: 72 yo male h/o psvt s/p ablation 10 years ago with recent knee replacement comes in with ble swelling and palpitations.  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  His legs have started swelling.  He feels like his svt is starting to come back, with brief episodes of palpitations that last several minutes and then resolve.  When this happens he gets pressure like sensation.  Has some sob but this is much better than with his physical therapy.  No fevers.  No pain in legs.  No n/v.  Review of Systems:  Positive and negative as per HPI otherwise all other systems are negative  Past Medical History: Past Medical History  Diagnosis Date  . Diabetes mellitus   . Hyperlipidemia   . Hypertension   . S/P ablation of ventricular arrhythmia   . GERD (gastroesophageal reflux disease)   . History of kidney stones   . Coronary artery disease CARDIOLOGIST - DR Verne Carrow-  LAST VISIT 01-27-2011  IN EPIC  . Right ureteral stone   . Bilateral kidney stones   . SVT (supraventricular tachycardia)     s/p ablation per Dr. Ladona Ridgel 10 -58yrs ago  . OSA on CPAP     cpap setting of 16  . Arthritis    Past Surgical History  Procedure Laterality Date  . Right ureteroscopic stone extraction  09-26-2010  . Circumcision and fulgeration of condyloma  06-22-2003  . Cardiac electrophysiology study and ablation  2002  . Transthoracic echocardiogram  03-03-2011    MODERATE LVH/ LVSF NORMAL / EF 60-65%/ MILDLY DILATED LEFT ATRIUMK  . Severeal ureteroscopic stone extractions    . Ureteroscopy  11/13/2011    Procedure: URETEROSCOPY;  Surgeon: Garnett Farm, MD;  Location: [facility];  Service: Urology;  Laterality: Right;  RIGHT URETEROSCOPY WITH HOLMIUM LASER LIHTOTRIPSY DIGITAL URETEROSCOPE  . Coronary  angioplasty  1994    DCA OF THE LAD  . Cardiac catheterization  04-27-2007    CAD WITH 30% NARROWING IN THE MID-LAD/ 50 % NARROWING PROXIMAL CIRCUMFLEX/ 40% NARROWING SECOND MARGINAL BRANCH/ 40-50% PROXIMAL RCA WITH DISTAL POSTERIOR NARROWING/ NORMAL LVF  . Cardiac catheterization  2000    NON-OBSTRUCTIVE CAD  . Laparoscopic cholecystectomy  03-26-2005  . Total knee arthroplasty  07/18/2012    Procedure: TOTAL KNEE ARTHROPLASTY;  Surgeon: Javier Docker, MD;  Location: [facility];  Service: Orthopedics;  Laterality: Right;    Medications: Prior to Admission medications   Medication Sig Start Date End Date Taking? Authorizing Provider  aspirin EC 81 MG tablet Take 81 mg by mouth daily.   Yes Historical Provider, MD  atorvastatin (LIPITOR) 20 MG tablet Take 20 mg by mouth at bedtime.   Yes Historical Provider, MD  doxazosin (CARDURA) 2 MG tablet Take 2 mg by mouth daily before breakfast.    Yes Historical Provider, MD  furosemide (LASIX) 20 MG tablet Take 20 mg by mouth daily before breakfast.  06/24/12  Yes Historical Provider, MD  lisinopril (PRINIVIL,ZESTRIL) 10 MG tablet Take 10 mg by mouth every evening.   Yes Historical Provider, MD  metFORMIN (GLUCOPHAGE) 1000 MG tablet Take 1,000 mg by mouth 2 (two) times daily with a meal.   Yes Historical Provider, MD  methocarbamol (ROBAXIN) 500 MG tablet Take 1  tablet (500 mg total) by mouth 3 (three) times daily between meals as needed. 07/18/12  Yes Javier Docker, MD  metoprolol (LOPRESSOR) 100 MG tablet Take 100-200 mg by mouth 2 (two) times daily. Takes 2 tablets at breakfast and 1 tablet at night   Yes Historical Provider, MD  Multiple Vitamin (MULTIVITAMIN WITH MINERALS) TABS Take 1 tablet by mouth daily.   Yes Historical Provider, MD  Multiple Vitamins-Minerals (ICAPS AREDS FORMULA PO) Take 1 capsule by mouth 2 (two) times daily.   Yes Historical Provider, MD  NOVOLIN 70/30 RELION (70-30) 100 UNIT/ML injection Inject 60-90 Units into the skin 2  (two) times daily with a meal. Takes 60 units in the morning and 90 units at night 02/22/12  Yes Historical Provider, MD  omeprazole (PRILOSEC) 20 MG capsule Take 20 mg by mouth at bedtime.    Yes Historical Provider, MD  oxyCODONE-acetaminophen (PERCOCET) 7.5-325 MG per tablet Take 1 tablet by mouth every 4 (four) hours as needed for pain. 07/18/12  Yes Javier Docker, MD    Allergies:   Allergies  Allergen Reactions  . Codeine Itching  . Penicillins Rash    Social History:  reports that he quit smoking about 26 years ago. His smoking use included Cigarettes. He has a 15 pack-year smoking history. He has never used smokeless tobacco. He reports that  drinks alcohol. His drug history is not on file.  Family History: Family History  Problem Relation Age of Onset  . Heart attack Mother   . Heart attack Brother   .     Marland Kitchen Colon cancer Neg Hx   . Stomach cancer Neg Hx     Physical Exam: Filed Vitals:   10/03/12 1954 10/03/12 2300 10/03/12 2315 10/04/12 0029  BP: 181/74 172/73 170/70 168/70  Pulse: 54 63 62 64  Temp: 99.1 F (37.3 C)     TempSrc: Oral     Resp: 20 17 15 18   SpO2: 96% 95% 96% 100%   General appearance: alert, cooperative and no distress Head: Normocephalic, without obvious abnormality, atraumatic Eyes: negative Nose: Nares normal. Septum midline. Mucosa normal. No drainage or sinus tenderness. Neck: no JVD and supple, symmetrical, trachea midline Lungs: clear to auscultation bilaterally Heart: regular rate and rhythm, S1, S2 normal, no murmur, click, rub or gallop Abdomen: soft, non-tender; bowel sounds normal; no masses,  no organomegaly Extremities: edema 1+ble Pulses: 2+ and symmetric Skin: Skin color, texture, turgor normal. No rashes or lesions Neurologic: Grossly normal  Labs on Admission:   Recent Labs  10/03/12 2006  NA 143  K 4.0  CL 106  CO2 26  GLUCOSE 197*  BUN 16  CREATININE 1.11  CALCIUM 9.3    Recent Labs  10/03/12 2006  AST 17   ALT 20  ALKPHOS 65  BILITOT 0.4  PROT 6.9  ALBUMIN 3.4*    Recent Labs  10/03/12 2006  WBC 8.8  NEUTROABS 4.3  HGB 13.6  HCT 38.3*  MCV 93.6  PLT 223    Radiological Exams on Admission: Dg Chest 2 View  10/03/2012  *RADIOLOGY REPORT*  Clinical Data: Chest pain  CHEST - 2 VIEW  Comparison: 11/21/2011  Findings: COPD with hyperinflation of the lungs.  Negative for pneumonia.  Negative for heart failure or effusion.  IMPRESSION: COPD.  No acute cardiopulmonary abnormality.   Original Report Authenticated By: Janeece Riggers, M.D.    Ct Angio Chest Pe W/cm &/or Wo Cm  10/04/2012  *RADIOLOGY REPORT*  Clinical Data:  Chest pain, shortness of breath  CT ANGIOGRAPHY CHEST  Technique:  Multidetector CT imaging of the chest using the standard protocol during bolus administration of intravenous contrast. Multiplanar reconstructed images including MIPs were obtained and reviewed to evaluate the vascular anatomy.  Contrast: OMNIPAQUE IOHEXOL 350 MG/ML SOLN  Comparison: 10/03/2012 chest radiograph, 10/29/2011 CT urogram.  Findings: The pulmonary arterial branch vessels are patent.  Normal caliber aorta with scattered atherosclerotic disease.  Cardiomegaly.  Coronary artery calcification.  No pleural or pericardial effusion.  No intrathoracic lymphadenopathy.  Central airways are patent.  Peribronchial thickening lower lobes. Linear opacity within the lower lobes, favor atelectasis or scarring.  No pneumothorax.  Limited images through the upper abdomen show no acute finding. Partially imaged right adrenal nodule, indeterminate, however similar to prior CT abdomen.  Multilevel degenerative changes of the imaged spine. No acute or aggressive appearing osseous lesion.  IMPRESSION: No pulmonary embolism.  Bilateral lower lobe peribronchial thickening may reflect acute and/or chronic bronchitis.  Cardiomegaly with coronary artery calcification.   Original Report Authenticated By: Jearld Lesch, M.D.      Assessment/Plan 72 yo male with sob, atypical cp, h/o psvt  Principal Problem:   Chest pain Active Problems:   BEN HTN HEART DISEASE WITHOUT HEART FAIL   CORONARY ATHEROSCLEROSIS NATIVE CORONARY ARTERY   Right knee DJD   Paroxysmal SVT (supraventricular tachycardia)  Romi.  Tele.  Echo in am.  Asa.  Given some lasix in ED.  May need outpt holter monitor to see if having episodes of psvt.  cta neg for PE.  Full code.  Timoteo Carreiro A 10/04/2012, 3:13 AM

## 2012-10-04 NOTE — Progress Notes (Signed)
Patient admitted early this AM by Dr. Onalee Hua- please see H&P.  He in on tele for HR monitoring.  Echo is pending.    [name]

## 2012-10-04 NOTE — ED Notes (Signed)
Report called to [id number] 

## 2012-10-04 NOTE — Progress Notes (Signed)
1610 Patient arrived to unit by stretcher without oxygen or IV pump. Pt.is ambulatory and was able to transport from stretcher to bed without assistance. He had no c/o pain. No signs of distress. He is A/Ox4.

## 2012-10-04 NOTE — Progress Notes (Signed)
Utilization Review Completed.   Zarin Knupp, RN, BSN Nurse Case Manager  [phone]  

## 2012-10-05 DIAGNOSIS — I5033 Acute on chronic diastolic (congestive) heart failure: Secondary | ICD-10-CM | POA: Diagnosis present

## 2012-10-05 LAB — GLUCOSE, CAPILLARY: Glucose-Capillary: 125 mg/dL — ABNORMAL HIGH (ref 70–99)

## 2012-10-05 MED ORDER — FUROSEMIDE 20 MG PO TABS: 20.0000 mg | ORAL_TABLET | Freq: Every day | ORAL | Status: DC

## 2012-10-05 NOTE — Discharge Summary (Signed)
Physician Discharge Summary  [name] HQI:[id number] DOB: Jul 01, 1940 DOA: 10/03/2012  PCP: Lupita Raider, MD  Admit date: 10/03/2012 Discharge date: 10/05/2012  Time spent: 35 minutes  Recommendations for Outpatient Follow-up:  1. Patient to call cardiology office if he has palpitations to get an event monitor    Discharge Diagnoses:     Acute on chronic diastolic CHF (congestive heart failure)   CORONARY ATHEROSCLEROSIS NATIVE CORONARY ARTERY - non obstructive    Right knee DJD s/p TKR  Hx of  Paroxysmal SVT (supraventricular tachycardia) s/p ablation DM type 2 OSA on CPAP    Discharge Condition: good  Diet recommendation: heart healthy   Filed Weights   10/04/12 0348 10/05/12 0547  Weight: 133.6 kg (294 lb 8.6 oz) 130.999 kg (288 lb 12.8 oz)    History of present illness:  72 yo male h/o psvt s/p ablation 10 years ago with recent knee replacement comes in with ble swelling and uneasy feeling in the chest. 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 His legs have started swelling. Has some sob but this is much better than with his physical therapy. No fevers. No pain in legs. No n/v.   Hospital Course:  72 yo WM with h/o CAD, DM, HLD, SVT here today for cardiac follow up. He has been followed in the past by Dr. Juanda Chance. He had a remote PTCA and DCA of the LAD. He underwent catheterization in 2000 because of anginal chest pain and was found to have nonobstructive CAD. 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 He couldn't afford the latter and later Cardizem was discontinued and he is currently only on metoprolol. He has a history of SVT ablation by Dr. Ladona Ridgel.  CTA was negative for PE , cardiac enzymes were OK. Telemetry was without any SVT Current symptoms are probably related to volume overload in the setting of  diastolic dysfunction . Patient received iv lasix and diuresed close to 5000 cc of fluid - he felt much better and the LE edema resolved. Dyspnea resolved and he ambulated without problems in the hallway.  He will f/u with cards   Procedures: Echo  2 D echo  LV EF: 55% - 60%  ------------------------------------------------------------ Indications: Chest pain 786.51.  ------------------------------------------------------------ History: PMH: Hyperlipidemia  Coronary artery disease.  ------------------------------------------------------------ Study Conclusions  - Left ventricle: The cavity size was mildly dilated. Wall thickness was increased in a pattern of mild LVH. Systolic function was normal. The estimated ejection fraction was in the range of 55% to 60%. Wall motion was normal; there were no regional wall motion abnormalities. Doppler parameters are consistent with abnormal left ventricular relaxation (grade 1 diastolic dysfunction). - Left atrium: The atrium was mildly dilated.    Consultations:  none  Discharge Exam: Filed Vitals:   10/04/12 2049 10/04/12 2205 10/05/12 0547 10/05/12 0732  BP: 146/64  126/54 138/71  Pulse: 68 74 60 70  Temp: 98.1 F (36.7 C)  97.9 F (36.6 C) 97.2 F (36.2 C)  TempSrc: Oral  Oral Oral  Resp: 18 17 18 18   Height:      Weight:   130.999 kg (288 lb 12.8 oz)   SpO2: 98% 98% 99% 97%    General: axox3 Cardiovascular: rrr Respiratory: ctab   Discharge Instructions  Discharge Orders   Future Orders Complete By  Expires     (HEART FAILURE PATIENTS) Call MD:  Anytime you have any of the following symptoms: 1) 3 pound weight gain in 24 hours or 5 pounds in 1 week 2) shortness of breath, with or without a dry hacking cough 3) swelling in the hands, feet or stomach 4) if you have to sleep on extra pillows at night in order to breathe.  As directed     Diet - low sodium heart healthy  As directed     Increase activity slowly  As  directed         Medication List    STOP taking these medications       methocarbamol 500 MG tablet  Commonly known as:  ROBAXIN      TAKE these medications       aspirin EC 81 MG tablet  Take 81 mg by mouth daily.     atorvastatin 20 MG tablet  Commonly known as:  LIPITOR  Take 20 mg by mouth at bedtime.     doxazosin 2 MG tablet  Commonly known as:  CARDURA  Take 2 mg by mouth daily before breakfast.     furosemide 20 MG tablet  Commonly known as:  LASIX  Take 1 tablet (20 mg total) by mouth daily before breakfast. Take on the days when you notice swelling     ICAPS AREDS FORMULA PO  Take 1 capsule by mouth 2 (two) times daily.     lisinopril 10 MG tablet  Commonly known as:  PRINIVIL,ZESTRIL  Take 10 mg by mouth every evening.     metFORMIN 1000 MG tablet  Commonly known as:  GLUCOPHAGE  Take 1,000 mg by mouth 2 (two) times daily with a meal.     metoprolol 100 MG tablet  Commonly known as:  LOPRESSOR  Take 100-200 mg by mouth 2 (two) times daily. Takes 2 tablets at breakfast and 1 tablet at night     multivitamin with minerals Tabs  Take 1 tablet by mouth daily.     NOVOLIN 70/30 RELION (70-30) 100 UNIT/ML injection  Generic drug:  insulin NPH-regular  Inject 60-90 Units into the skin 2 (two) times daily with a meal. Takes 60 units in the morning and 90 units at night     omeprazole 20 MG capsule  Commonly known as:  PRILOSEC  Take 20 mg by mouth at bedtime.     oxyCODONE-acetaminophen 7.5-325 MG per tablet  Commonly known as:  PERCOCET  Take 1 tablet by mouth every 4 (four) hours as needed for pain.           Follow-up Information   Schedule an appointment as soon as possible for a visit with Lupita Raider, MD.   Contact information:   [address] [place] [address] [phone]       Schedule an appointment as soon as possible for a visit with MCALHANY,CHRISTOPHER, MD.   Contact information:   [address] [place] [address] [phone]        The results of significant diagnostics from this hospitalization (including imaging, microbiology, ancillary and laboratory) are listed below for reference.    Significant Diagnostic Studies: Dg Chest 2 View  10/03/2012  *RADIOLOGY REPORT*  Clinical Data: Chest pain  CHEST - 2 VIEW  Comparison: 11/21/2011  Findings: COPD with hyperinflation of the lungs.  Negative for pneumonia.  Negative for heart failure or effusion.  IMPRESSION: COPD.  No acute cardiopulmonary  abnormality.   Original Report Authenticated By: Janeece Riggers, M.D.    Ct Angio Chest Pe W/cm &/or Wo Cm  10/04/2012  *RADIOLOGY REPORT*  Clinical Data: Chest pain, shortness of breath  CT ANGIOGRAPHY CHEST  Technique:  Multidetector CT imaging of the chest using the standard protocol during bolus administration of intravenous contrast. Multiplanar reconstructed images including MIPs were obtained and reviewed to evaluate the vascular anatomy.  Contrast: OMNIPAQUE IOHEXOL 350 MG/ML SOLN  Comparison: 10/03/2012 chest radiograph, 10/29/2011 CT urogram.  Findings: The pulmonary arterial branch vessels are patent.  Normal caliber aorta with scattered atherosclerotic disease.  Cardiomegaly.  Coronary artery calcification.  No pleural or pericardial effusion.  No intrathoracic lymphadenopathy.  Central airways are patent.  Peribronchial thickening lower lobes. Linear opacity within the lower lobes, favor atelectasis or scarring.  No pneumothorax.  Limited images through the upper abdomen show no acute finding. Partially imaged right adrenal nodule, indeterminate, however similar to prior CT abdomen.  Multilevel degenerative changes of the imaged spine. No acute or aggressive appearing osseous lesion.  IMPRESSION: No pulmonary embolism.  Bilateral lower lobe peribronchial thickening may reflect acute and/or chronic bronchitis.  Cardiomegaly with coronary artery calcification.   Original Report  Authenticated By: Jearld Lesch, M.D.     Microbiology: No results found for this or any previous visit (from the past 240 hour(s)).   Labs: Basic Metabolic Panel:  Recent Labs Lab 10/03/12 2006 10/04/12 0430  NA 143  --   K 4.0  --   CL 106  --   CO2 26  --   GLUCOSE 197*  --   BUN 16  --   CREATININE 1.11 1.06  CALCIUM 9.3  --    Liver Function Tests:  Recent Labs Lab 10/03/12 2006  AST 17  ALT 20  ALKPHOS 65  BILITOT 0.4  PROT 6.9  ALBUMIN 3.4*   No results found for this basename: LIPASE, AMYLASE,  in the last 168 hours No results found for this basename: AMMONIA,  in the last 168 hours CBC:  Recent Labs Lab 10/03/12 2006 10/04/12 0430  WBC 8.8 9.1  NEUTROABS 4.3  --   HGB 13.6 13.1  HCT 38.3* 37.2*  MCV 93.6 93.9  PLT 223 219   Cardiac Enzymes:  Recent Labs Lab 10/04/12 0430 10/04/12 0938 10/04/12 1526  TROPONINI <0.30 <0.30 <0.30   BNP: BNP (last 3 results)  Recent Labs  10/03/12 2006  PROBNP 514.8*   CBG:  Recent Labs Lab 10/04/12 0611 10/04/12 1758 10/04/12 2105 10/05/12 0612  GLUCAP 124* 209* 201* 125*       Signed:  Yohanna Tow  [facility] 10/05/2012, 9:16 AM

## 2012-10-05 NOTE — Progress Notes (Signed)
Pt being dc to home, dc instructions, medications, and follow up appointments given to pt, pt verbalized understanding, pt left via wheelchair, pt stable

## 2012-10-13 ENCOUNTER — Encounter: Payer: Self-pay | Admitting: Cardiovascular Disease

## 2012-10-13 ENCOUNTER — Ambulatory Visit (INDEPENDENT_AMBULATORY_CARE_PROVIDER_SITE_OTHER): Payer: Medicare Other | Admitting: Cardiovascular Disease

## 2012-10-13 VITALS — BP 142/70 | HR 70 | Ht 78.0 in | Wt 292.0 lb

## 2012-10-13 DIAGNOSIS — I2510 Atherosclerotic heart disease of native coronary artery without angina pectoris: Secondary | ICD-10-CM

## 2012-10-13 DIAGNOSIS — I5031 Acute diastolic (congestive) heart failure: Secondary | ICD-10-CM

## 2012-10-13 MED ORDER — FUROSEMIDE 20 MG PO TABS: ORAL_TABLET | ORAL | Status: DC

## 2012-10-13 MED ORDER — POTASSIUM CHLORIDE CRYS ER 20 MEQ PO TBCR: EXTENDED_RELEASE_TABLET | ORAL | Status: DC

## 2012-10-13 NOTE — Progress Notes (Signed)
History of Present Illness: 72 yo WM with h/o CAD, DM, HLD, SVT here today for cardiac follow up. He has been followed in the past by Dr. Juanda Chance. He had a remote PTCA and DCA of the LAD. He underwent catheterization in 2000 because of anginal chest pain and was found to have nonobstructive CAD. 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 He couldn't afford the latter and later Cardizem was discontinued and he is currently only on metoprolol. He has a history of SVT ablation by Dr. Ladona Ridgel. Echo on 03/02/12 with moderate LVH, normal LV function. Exercise tolerance testing 03/02/12 and he exercised for 4 minutes without EKG changes suggestive of ischemia. Right knee replacement February 2014. He was admitted to [facility] 10/03/12 with c/o lower ext edema, funny feeling in chest. Cardiac markers negative. CTA negative for PE. Tele without SVT. Echo with normal LV size and function. He was diuresed with IV Lasix and felt better. Discharged home on 10/05/12.   He is here today for follow up.  He is feeling well. No chest pain or SOB. His lower ext swelling is better but still present. NO dizziness. No near [place]  Primary Care Physician: Lupita Raider  Orthopedics: Dr. Jene Every   Last Lipid Profile: Followed in primary care.   Past Medical History  Diagnosis Date  . Diabetes mellitus   . Hyperlipidemia   . Hypertension   . S/P ablation of ventricular arrhythmia   . GERD (gastroesophageal reflux disease)   . History of kidney stones   . Coronary artery disease CARDIOLOGIST - DR Verne Carrow-  LAST VISIT 01-27-2011  IN [facility]  . Right ureteral stone   . Bilateral kidney stones   . SVT (supraventricular tachycardia)     s/p ablation per Dr. Ladona Ridgel 10 -56yrs ago  . OSA on CPAP     cpap setting of 17  . Arthritis     Past Surgical History  Procedure Laterality Date  . Right ureteroscopic stone extraction  09-26-2010  . Circumcision and fulgeration of  condyloma  06-22-2003  . Cardiac electrophysiology study and ablation  2002  . Transthoracic echocardiogram  03-03-2011    MODERATE LVH/ LVSF NORMAL / EF 60-65%/ MILDLY DILATED LEFT ATRIUMK  . Severeal ureteroscopic stone extractions    . Ureteroscopy  11/13/2011    Procedure: URETEROSCOPY;  Surgeon: Garnett Farm, MD;  Location: [facility];  Service: Urology;  Laterality: Right;  RIGHT URETEROSCOPY WITH HOLMIUM LASER LIHTOTRIPSY DIGITAL URETEROSCOPE  . Coronary angioplasty  1994    DCA OF THE LAD  . Cardiac catheterization  04-27-2007    CAD WITH 30% NARROWING IN THE MID-LAD/ 50 % NARROWING PROXIMAL CIRCUMFLEX/ 40% NARROWING SECOND MARGINAL BRANCH/ 40-50% PROXIMAL RCA WITH DISTAL POSTERIOR NARROWING/ NORMAL LVF  . Cardiac catheterization  2000    NON-OBSTRUCTIVE CAD  . Laparoscopic cholecystectomy  03-26-2005  . Total knee arthroplasty  07/18/2012    Procedure: TOTAL KNEE ARTHROPLASTY;  Surgeon: Javier Docker, MD;  Location: [facility];  Service: Orthopedics;  Laterality: Right;    Current Outpatient Prescriptions  Medication Sig Dispense Refill  . aspirin EC 81 MG tablet Take 81 mg by mouth daily.      Marland Kitchen atorvastatin (LIPITOR) 20 MG tablet Take 20 mg by mouth at bedtime.      Marland Kitchen doxazosin (CARDURA) 2 MG tablet Take 2 mg by mouth daily before breakfast.       . furosemide (LASIX)  20 MG tablet Take 1 tablet (20 mg total) by mouth daily before breakfast. Take on the days when you notice swelling  30 tablet  0  . lisinopril (PRINIVIL,ZESTRIL) 10 MG tablet Take 10 mg by mouth every evening.      . metFORMIN (GLUCOPHAGE) 1000 MG tablet Take 1,000 mg by mouth 2 (two) times daily with a meal.      . metoprolol (LOPRESSOR) 100 MG tablet Take 100-200 mg by mouth 2 (two) times daily. Takes 2 tablets at breakfast and 1 tablet at night      . Multiple Vitamin (MULTIVITAMIN WITH MINERALS) TABS Take 1 tablet by mouth daily.      . Multiple Vitamins-Minerals (ICAPS AREDS FORMULA PO) Take 1  capsule by mouth 2 (two) times daily.      Marland Kitchen NOVOLIN 70/30 RELION (70-30) 100 UNIT/ML injection Inject 60-90 Units into the skin 2 (two) times daily with a meal. Takes 60 units in the morning and 90 units at night      . omeprazole (PRILOSEC) 20 MG capsule Take 20 mg by mouth at bedtime.        No current facility-administered medications for this visit.    Allergies  Allergen Reactions  . Codeine Itching  . Penicillins Rash    History   Social History  . Marital Status: Married    Spouse Name: N/A    Number of Children: N/A  . Years of Education: N/A   Occupational History  . Not on file.   Social History Main Topics  . Smoking status: Former Smoker -- 1.00 packs/day for 15 years    Types: Cigarettes    Quit date: 06/15/1986  . Smokeless tobacco: Never Used  . Alcohol Use: Yes     Comment: occasional  . Drug Use: Not on file  . Sexually Active: Not on file   Other Topics Concern  . Not on file   Social History Narrative  . No narrative on file    Family History  Problem Relation Age of Onset  . Heart attack Mother   . Heart attack Brother   .     Marland Kitchen Colon cancer Neg Hx   . Stomach cancer Neg Hx     Review of Systems:  As stated in the HPI and otherwise negative.   BP 142/70  Pulse 70  Ht 6\' 6"  (1.981 m)  Wt 292 lb (132.45 kg)  BMI 33.75 kg/m2  SpO2 97%  Physical Examination: General: Well developed, well nourished, NAD HEENT: OP clear, mucus membranes moist SKIN: warm, dry. No rashes. Neuro: No focal deficits Musculoskeletal: Muscle strength 5/5 all ext Psychiatric: Mood and affect normal Neck: No JVD, no carotid bruits, no thyromegaly, no lymphadenopathy. Lungs:Clear bilaterally, no wheezes, rhonci, crackles Cardiovascular: Regular rate and rhythm. No murmurs, gallops or rubs. Abdomen:Soft. Bowel sounds present. Non-tender.  Extremities: 1-2+ bilateral lower extremity edema. Pulses are 2 + in the bilateral DP/PT.  Echo 10/04/12: Left ventricle:  The cavity size was mildly dilated. Wall thickness was increased in a pattern of mild LVH. Systolic function was normal. The estimated ejection fraction was in the range of 55% to 60%. Wall motion was normal; there were no regional wall motion abnormalities. Doppler parameters are consistent with abnormal left ventricular relaxation (grade 1 diastolic dysfunction). - Left atrium: The atrium was mildly dilated.  Assessment and Plan:   1. CAD: Stable. Continue current meds.   2. Hyperlipidemia: Continue statin. Followed in primary care.   3. SVT:  He has had no palpitations. Continue beta blocker.   4. Chronic diastolic CHF: Will increase Lasix to 60 mg po QDaily for 7 days, then check BMET. Further adjustment based on his response to the lasix and renal function. Will also add Kdur 20 meq Qdaily.

## 2012-10-13 NOTE — Patient Instructions (Signed)
Your physician recommends that you schedule a follow-up appointment in:  4-6 weeks.  Your physician recommends that you return for lab work in: one week--Oct 20, 2012--BMP  Your physician has recommended you make the following change in your medication:  Take furosemide 60 mg by mouth daily for 1 week. Take potassium 20 meq daily for 1 week ( while taking furosemide)

## 2012-10-19 NOTE — ED Provider Notes (Addendum)
History     CSN: [id number]  Arrival date & time 10/03/12  1909   First MD Initiated Contact with Patient 10/03/12 2026      Chief Complaint  Patient presents with  . Chest Pain    (Consider location/radiation/quality/duration/timing/severity/associated sxs/prior treatment) HPI Comments: Patient presents to ER with complaints of increasing swelling of his lower legs with shortness of breath. Symptoms have been present for approximately one week, progressively worsening. He has been experiencing intermittent pain in the center of his chest with occasional palpitations. No nausea or diaphoresis.  Patient is a 72 y.o. male presenting with chest pain.  Chest Pain Associated symptoms: palpitations and shortness of breath     Past Medical History  Diagnosis Date  . Diabetes mellitus   . Hyperlipidemia   . Hypertension   . S/P ablation of ventricular arrhythmia   . GERD (gastroesophageal reflux disease)   . History of kidney stones   . Coronary artery disease CARDIOLOGIST - DR Verne Carrow-  LAST VISIT 01-27-2011  IN [facility]  . Right ureteral stone   . Bilateral kidney stones   . SVT (supraventricular tachycardia)     s/p ablation per Dr. Ladona Ridgel 10 -14yrs ago  . OSA on CPAP     cpap setting of 16  . Arthritis     Past Surgical History  Procedure Laterality Date  . Right ureteroscopic stone extraction  09-26-2010  . Circumcision and fulgeration of condyloma  06-22-2003  . Cardiac electrophysiology study and ablation  2002  . Transthoracic echocardiogram  03-03-2011    MODERATE LVH/ LVSF NORMAL / EF 60-65%/ MILDLY DILATED LEFT ATRIUMK  . Severeal ureteroscopic stone extractions    . Ureteroscopy  11/13/2011    Procedure: URETEROSCOPY;  Surgeon: Garnett Farm, MD;  Location: [facility];  Service: Urology;  Laterality: Right;  RIGHT URETEROSCOPY WITH HOLMIUM LASER LIHTOTRIPSY DIGITAL URETEROSCOPE  . Coronary angioplasty  1994    DCA OF THE LAD  . Cardiac  catheterization  04-27-2007    CAD WITH 30% NARROWING IN THE MID-LAD/ 50 % NARROWING PROXIMAL CIRCUMFLEX/ 40% NARROWING SECOND MARGINAL BRANCH/ 40-50% PROXIMAL RCA WITH DISTAL POSTERIOR NARROWING/ NORMAL LVF  . Cardiac catheterization  2000    NON-OBSTRUCTIVE CAD  . Laparoscopic cholecystectomy  03-26-2005  . Total knee arthroplasty  07/18/2012    Procedure: TOTAL KNEE ARTHROPLASTY;  Surgeon: Javier Docker, MD;  Location: [facility];  Service: Orthopedics;  Laterality: Right;    Family History  Problem Relation Age of Onset  . Heart attack Mother   . Heart attack Brother   .     Marland Kitchen Colon cancer Neg Hx   . Stomach cancer Neg Hx     History  Substance Use Topics  . Smoking status: Former Smoker -- 1.00 packs/day for 15 years    Types: Cigarettes    Quit date: 06/15/1986  . Smokeless tobacco: Never Used  . Alcohol Use: Yes     Comment: occasional      Review of Systems  Respiratory: Positive for shortness of breath.   Cardiovascular: Positive for chest pain, palpitations and leg swelling.  All other systems reviewed and are negative.    Allergies  Codeine and Penicillins  Home Medications   Current Outpatient Rx  Name  Route  Sig  Dispense  Refill  . aspirin EC 81 MG tablet   Oral   Take 81 mg by mouth daily.         Marland Kitchen atorvastatin (LIPITOR)  20 MG tablet   Oral   Take 20 mg by mouth at bedtime.         Marland Kitchen doxazosin (CARDURA) 2 MG tablet   Oral   Take 2 mg by mouth daily before breakfast.          . lisinopril (PRINIVIL,ZESTRIL) 10 MG tablet   Oral   Take 10 mg by mouth every evening.         . metFORMIN (GLUCOPHAGE) 1000 MG tablet   Oral   Take 1,000 mg by mouth 2 (two) times daily with a meal.         . metoprolol (LOPRESSOR) 100 MG tablet   Oral   Take 100-200 mg by mouth 2 (two) times daily. Takes 2 tablets at breakfast and 1 tablet at night         . Multiple Vitamin (MULTIVITAMIN WITH MINERALS) TABS   Oral   Take 1 tablet by mouth daily.          . Multiple Vitamins-Minerals (ICAPS AREDS FORMULA PO)   Oral   Take 1 capsule by mouth 2 (two) times daily.         Marland Kitchen NOVOLIN 70/30 RELION (70-30) 100 UNIT/ML injection   Subcutaneous   Inject 60-90 Units into the skin 2 (two) times daily with a meal. Takes 60 units in the morning and 90 units at night         . omeprazole (PRILOSEC) 20 MG capsule   Oral   Take 20 mg by mouth at bedtime.          . furosemide (LASIX) 20 MG tablet      Take as directed   90 tablet   3   . potassium chloride SA (K-DUR,KLOR-CON) 20 MEQ tablet      Take as directed   30 tablet   3     BP 112/73  Pulse 77  Temp(Src) 98.4 F (36.9 C) (Oral)  Resp 18  Ht 6\' 6"  (1.981 m)  Wt 288 lb 12.8 oz (130.999 kg)  BMI 33.38 kg/m2  SpO2 94%  Physical Exam  Constitutional: He is oriented to person, place, and time. He appears well-developed and well-nourished. No distress.  HENT:  Head: Normocephalic and atraumatic.  Right Ear: Hearing normal.  Nose: Nose normal.  Mouth/Throat: Oropharynx is clear and moist and mucous membranes are normal.  Eyes: Conjunctivae and EOM are normal. Pupils are equal, round, and reactive to light.  Neck: Normal range of motion. Neck supple.  Cardiovascular: Normal rate, regular rhythm, S1 normal and S2 normal.  Exam reveals no gallop and no friction rub.   No murmur heard. Pulmonary/Chest: Effort normal and breath sounds normal. No respiratory distress. He exhibits no tenderness.  Abdominal: Soft. Normal appearance and bowel sounds are normal. There is no hepatosplenomegaly. There is no tenderness. There is no rebound, no guarding, no tenderness at McBurney's point and negative Murphy's sign. No hernia.  Musculoskeletal: Normal range of motion. He exhibits edema.  Neurological: He is alert and oriented to person, place, and time. He has normal strength. No cranial nerve deficit or sensory deficit. Coordination normal. GCS eye subscore is 4. GCS verbal subscore  is 5. GCS motor subscore is 6.  Skin: Skin is warm, dry and intact. No rash noted. No cyanosis.  Psychiatric: He has a normal mood and affect. His speech is normal and behavior is normal. Thought content normal.    ED Course  Procedures (including critical care time)  EKG:  Date: 10/19/2012  Rate: 68   Rhythm: normal sinus rhythm  QRS Axis: normal  Intervals: normal  ST/T Wave abnormalities: nonspecific ST/T changes  Conduction Disutrbances:none  Narrative Interpretation:   Old EKG Reviewed: none available    Labs Reviewed  CBC WITH DIFFERENTIAL - Abnormal; Notable for the following:    RBC 4.09 (*)    HCT 38.3 (*)    All other components within normal limits  COMPREHENSIVE METABOLIC PANEL - Abnormal; Notable for the following:    Glucose, Bld 197 (*)    Albumin 3.4 (*)    GFR calc non Af Amer 65 (*)    GFR calc Af Amer 75 (*)    All other components within normal limits  PRO B NATRIURETIC PEPTIDE - Abnormal; Notable for the following:    Pro B Natriuretic peptide (BNP) 514.8 (*)    All other components within normal limits  CBC - Abnormal; Notable for the following:    RBC 3.96 (*)    HCT 37.2 (*)    All other components within normal limits  CREATININE, SERUM - Abnormal; Notable for the following:    GFR calc non Af Amer 69 (*)    GFR calc Af Amer 80 (*)    All other components within normal limits  GLUCOSE, CAPILLARY - Abnormal; Notable for the following:    Glucose-Capillary 124 (*)    All other components within normal limits  GLUCOSE, CAPILLARY - Abnormal; Notable for the following:    Glucose-Capillary 209 (*)    All other components within normal limits  GLUCOSE, CAPILLARY - Abnormal; Notable for the following:    Glucose-Capillary 201 (*)    All other components within normal limits  GLUCOSE, CAPILLARY - Abnormal; Notable for the following:    Glucose-Capillary 125 (*)    All other components within normal limits  TROPONIN I  TROPONIN I  TROPONIN I   POCT I-STAT TROPONIN I   No results found.   1. Chest pain   2. CHF (congestive heart failure)   3. Benign hypertensive heart disease without heart failure   4. Coronary atherosclerosis of native coronary artery   5. Acute on chronic diastolic CHF (congestive heart failure)   6. Paroxysmal SVT (supraventricular tachycardia)   7. Right knee DJD   8. BEN HTN HEART DISEASE WITHOUT HEART FAIL   9. CORONARY ATHEROSCLEROSIS NATIVE CORONARY ARTERY   10. Ureteral calculus, right   11. Fatigue   12. Mixed hyperlipidemia       MDM   Patient presents to the ER progressively worsening dyspnea on exertion. Symptoms have been present for 3 days. Patient did recently have knee surgery. He has been [place] for several weeks. Patient reports that previously had been doing his exercises without difficulty. Over last 3 days, however, he has been unable to walk across the room without becoming short of breath. Over this period of time he has noticed increased swelling of his lower extremities. Patient does have a history of congestive heart failure, takes Lasix. He has not been experiencing chest pain with the symptoms.  Patient's chest x-ray did not show any evidence of pneumonia. Patient does have mild cardiomegaly, but no obvious for congestive heart failure or edema. There is some suggestion of COPD.  Patient's BNP is 514.8. I do not have a baseline for him. Current presentation could be consistent with mild congestive heart failure. Because of the recent surgery, CT angiography was performed. No evidence of PE.  Case  was discussed with Dr. Charm Barges, on-call for [facility] cardiology. He did not feel that this was clearly a primarily cardiology problem. He recommended admission to internal medicine. Patient will need further cardiac workup, diuresis and evaluation for possible pulmonary causes of his shortness of breath. Case discussed with hospitalist.      Gilda Crease, MD 10/19/12  1610  Gilda Crease, MD 10/23/12 1540

## 2012-10-20 ENCOUNTER — Other Ambulatory Visit (INDEPENDENT_AMBULATORY_CARE_PROVIDER_SITE_OTHER): Payer: Medicare Other

## 2012-10-20 DIAGNOSIS — I5031 Acute diastolic (congestive) heart failure: Secondary | ICD-10-CM

## 2012-10-20 LAB — BASIC METABOLIC PANEL
CO2: 23 mEq/L (ref 19–32)
Calcium: 9.2 mg/dL (ref 8.4–10.5)
GFR: 51.37 mL/min — ABNORMAL LOW (ref >60.00)
Potassium: 5 mEq/L (ref 3.5–5.1)
Sodium: 140 mEq/L (ref 135–145)

## 2012-11-24 ENCOUNTER — Encounter: Payer: Medicare Other | Admitting: Cardiovascular Disease

## 2012-11-28 ENCOUNTER — Ambulatory Visit (INDEPENDENT_AMBULATORY_CARE_PROVIDER_SITE_OTHER): Payer: Medicare Other | Admitting: Cardiovascular Disease

## 2012-11-28 ENCOUNTER — Encounter: Payer: Self-pay | Admitting: Cardiovascular Disease

## 2012-11-28 VITALS — BP 124/67 | HR 66 | Ht 78.0 in | Wt 296.0 lb

## 2012-11-28 DIAGNOSIS — I5032 Chronic diastolic (congestive) heart failure: Secondary | ICD-10-CM

## 2012-11-28 DIAGNOSIS — I498 Other specified cardiac arrhythmias: Secondary | ICD-10-CM

## 2012-11-28 DIAGNOSIS — I2510 Atherosclerotic heart disease of native coronary artery without angina pectoris: Secondary | ICD-10-CM

## 2012-11-28 DIAGNOSIS — I471 Supraventricular tachycardia: Secondary | ICD-10-CM

## 2012-11-28 DIAGNOSIS — I5031 Acute diastolic (congestive) heart failure: Secondary | ICD-10-CM

## 2012-11-28 LAB — BASIC METABOLIC PANEL
CO2: 21 mEq/L (ref 19–32)
Chloride: 106 mEq/L (ref 96–112)
Potassium: 5 mEq/L (ref 3.5–5.1)

## 2012-11-28 MED ORDER — POTASSIUM CHLORIDE CRYS ER 20 MEQ PO TBCR: 20.0000 meq | EXTENDED_RELEASE_TABLET | Freq: Every day | ORAL | Status: DC

## 2012-11-28 MED ORDER — FUROSEMIDE 20 MG PO TABS: 60.0000 mg | ORAL_TABLET | Freq: Every day | ORAL | Status: DC

## 2012-11-28 NOTE — Patient Instructions (Signed)
Your physician wants you to follow-up in:  6 months. You will receive a reminder letter in the mail two months in advance. If you don't receive a letter, please call our office to schedule the follow-up appointment.  Continue furosemide 60 mg by mouth daily. Continue potassium 20 meq by mouth daily.

## 2012-11-28 NOTE — Progress Notes (Signed)
History of Present Illness: 72 yo WM with h/o CAD, DM, HLD, SVT here today for cardiac follow up. [name] has been followed in the past by Dr. Juanda Chance. [name] had a remote PTCA and DCA of the LAD. [name] underwent catheterization in 2000 because of anginal chest pain and was found to have nonobstructive CAD. 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 [name] couldn't afford the latter and later Cardizem was discontinued and [name] is currently only on metoprolol. [name] has a history of SVT ablation by Dr. Ladona Ridgel. Echo on 03/02/12 with moderate LVH, normal LV function. Exercise tolerance testing 03/02/12 and [name] exercised for 4 minutes without EKG changes suggestive of ischemia. Right knee replacement February 2014. [name] was admitted to [facility] 10/03/12 with c/o lower ext edema, funny feeling in chest. Cardiac markers negative. CTA negative for PE. Tele without SVT. Echo with normal LV size and function. [name] was diuresed with IV Lasix and felt better. Discharged home on 10/05/12.   [name] is here today for follow up. [name] is feeling well. No chest pain or SOB. His lower ext swelling is better but still present. NO dizziness. No near syncope.   Primary Care Physician: Lupita Raider  Orthopedics: Dr. Jene Every  Last Lipid Profile: Followed in primary care.    Primary Care Physician:  Last Lipid Profile:Lipid Panel     Component Value Date/Time   CHOL 141 12/08/2007 0935   TRIG 332* 12/08/2007 0935   HDL 32.3* 12/08/2007 0935   CHOLHDL 4.4 CALC 12/08/2007 0935   VLDL 66* 12/08/2007 0935   LDLCALC 34 05/25/2007 0904     Past Medical History  Diagnosis Date  . Diabetes mellitus   . Hyperlipidemia   . Hypertension   . S/P ablation of ventricular arrhythmia   . GERD (gastroesophageal reflux disease)   . History of kidney stones   . Coronary artery disease CARDIOLOGIST - DR Verne Carrow-  LAST VISIT 01-27-2011  IN [facility]  . Right ureteral stone   . Bilateral kidney stones   . SVT  (supraventricular tachycardia)     s/p ablation per Dr. Ladona Ridgel 10 -78yrs ago  . OSA on CPAP     cpap setting of 16  . Arthritis     Past Surgical History  Procedure Laterality Date  . Right ureteroscopic stone extraction  09-26-2010  . Circumcision and fulgeration of condyloma  06-22-2003  . Cardiac electrophysiology study and ablation  2002  . Transthoracic echocardiogram  03-03-2011    MODERATE LVH/ LVSF NORMAL / EF 60-65%/ MILDLY DILATED LEFT ATRIUMK  . Severeal ureteroscopic stone extractions    . Ureteroscopy  11/13/2011    Procedure: URETEROSCOPY;  Surgeon: Garnett Farm, MD;  Location: [facility];  Service: Urology;  Laterality: Right;  RIGHT URETEROSCOPY WITH HOLMIUM LASER LIHTOTRIPSY DIGITAL URETEROSCOPE  . Coronary angioplasty  1994    DCA OF THE LAD  . Cardiac catheterization  04-27-2007    CAD WITH 30% NARROWING IN THE MID-LAD/ 50 % NARROWING PROXIMAL CIRCUMFLEX/ 40% NARROWING SECOND MARGINAL BRANCH/ 40-50% PROXIMAL RCA WITH DISTAL POSTERIOR NARROWING/ NORMAL LVF  . Cardiac catheterization  2000    NON-OBSTRUCTIVE CAD  . Laparoscopic cholecystectomy  03-26-2005  . Total knee arthroplasty  07/18/2012    Procedure: TOTAL KNEE ARTHROPLASTY;  Surgeon: Javier Docker, MD;  Location: [facility];  Service: Orthopedics;  Laterality: Right;    Current Outpatient Prescriptions  Medication Sig Dispense Refill  . aspirin  EC 81 MG tablet Take 81 mg by mouth daily.      Marland Kitchen atorvastatin (LIPITOR) 20 MG tablet Take 20 mg by mouth at bedtime.      Marland Kitchen doxazosin (CARDURA) 2 MG tablet Take 2 mg by mouth daily before breakfast.       . furosemide (LASIX) 20 MG tablet Take as directed  90 tablet  3  . lisinopril (PRINIVIL,ZESTRIL) 10 MG tablet Take 10 mg by mouth every evening.      . metFORMIN (GLUCOPHAGE) 1000 MG tablet Take 1,000 mg by mouth 2 (two) times daily with a meal.      . metoprolol (LOPRESSOR) 100 MG tablet Take 100-200 mg by mouth 2 (two) times daily. Takes 2 tablets  at breakfast and 1 tablet at night      . Multiple Vitamin (MULTIVITAMIN WITH MINERALS) TABS Take 1 tablet by mouth daily.      . Multiple Vitamins-Minerals (ICAPS AREDS FORMULA PO) Take 1 capsule by mouth 2 (two) times daily.      Marland Kitchen NOVOLIN 70/30 RELION (70-30) 100 UNIT/ML injection Inject 30-60 Units into the skin 2 (two) times daily with a meal. Takes 60 units in the morning and 90 units at night      . omeprazole (PRILOSEC) 20 MG capsule Take 20 mg by mouth at bedtime.       . potassium chloride SA (K-DUR,KLOR-CON) 20 MEQ tablet Take as directed  30 tablet  3   No current facility-administered medications for this visit.    Allergies  Allergen Reactions  . Codeine Itching  . Penicillins Rash    History   Social History  . Marital Status: Married    Spouse Name: N/A    Number of Children: N/A  . Years of Education: N/A   Occupational History  . Not on file.   Social History Main Topics  . Smoking status: Former Smoker -- 1.00 packs/day for 15 years    Types: Cigarettes    Quit date: 06/15/1986  . Smokeless tobacco: Never Used  . Alcohol Use: Yes     Comment: occasional  . Drug Use: Not on file  . Sexually Active: Not on file   Other Topics Concern  . Not on file   Social History Narrative  . No narrative on file    Family History  Problem Relation Age of Onset  . Heart attack Mother   . Heart attack Brother   .     Marland Kitchen Colon cancer Neg Hx   . Stomach cancer Neg Hx     Review of Systems:  As stated in the HPI and otherwise negative.   BP 124/67  Pulse 66  Ht 6\' 6"  (1.981 m)  Wt 296 lb (134.265 kg)  BMI 34.21 kg/m2  Physical Examination: General: Well developed, well nourished, NAD HEENT: OP clear, mucus membranes moist SKIN: warm, dry. No rashes. Neuro: No focal deficits Musculoskeletal: Muscle strength 5/5 all ext Psychiatric: Mood and affect normal Neck: No JVD, no carotid bruits, no thyromegaly, no lymphadenopathy. Lungs:Clear bilaterally, no  wheezes, rhonci, crackles Cardiovascular: Regular rate and rhythm. No murmurs, gallops or rubs. Abdomen:Soft. Bowel sounds present. Non-tender.  Extremities: No lower extremity edema. Pulses are 2 + in the bilateral DP/PT.  EKG:  Echo 10/04/12:  Left ventricle: The cavity size was mildly dilated. Wall thickness was increased in a pattern of mild LVH. Systolic function was normal. The estimated ejection fraction was in the range of 55% to 60%. Wall motion  was normal; there were no regional wall motion abnormalities. Doppler parameters are consistent with abnormal left ventricular relaxation (grade 1 diastolic dysfunction). - Left atrium: The atrium was mildly dilated.  Assessment and Plan:   1. CAD: Stable. Continue current meds.   2. Hyperlipidemia: Continue statin. Followed in primary care.   3. SVT: [name] has had no palpitations. Continue beta blocker.   4. Chronic diastolic CHF: Volume status ok. Continue Lasix 60 mg po Qdaily. Will check BMET today.

## 2013-01-18 ENCOUNTER — Other Ambulatory Visit: Payer: Self-pay | Admitting: Adult Health

## 2013-01-18 ENCOUNTER — Encounter: Payer: Self-pay | Admitting: Cardiovascular Disease

## 2013-01-27 ENCOUNTER — Other Ambulatory Visit: Payer: Self-pay | Admitting: Dermatology

## 2013-04-05 ENCOUNTER — Telehealth: Payer: Self-pay | Admitting: *Deleted

## 2013-04-05 NOTE — Telephone Encounter (Signed)
Follow up    Pt returned call. Please call him back.   Thanks!

## 2013-04-05 NOTE — Telephone Encounter (Signed)
Spoke with pt who reports he is feeling well and not having any heart issues. I told him Dr. Clifton James will clear him for cataract surgery and I will send paperwork to [facility].

## 2013-04-05 NOTE — Telephone Encounter (Signed)
Received request from [facility] for clearance. I have placed call to pt to see how he is feeling. Left message to call back

## 2013-06-21 ENCOUNTER — Telehealth: Payer: Self-pay | Admitting: *Deleted

## 2013-06-21 NOTE — Telephone Encounter (Signed)
Received request from [facility] for surgical clearance. Dr. Angelena Form would like to see pt in office prior to clearing for surgery.  I spoke with pt's wife and appt made for pt to see Dr. Angelena Form on June 27, 2013 at 9:15.

## 2013-06-27 ENCOUNTER — Ambulatory Visit (INDEPENDENT_AMBULATORY_CARE_PROVIDER_SITE_OTHER): Payer: Medicare Other | Admitting: Cardiovascular Disease

## 2013-06-27 ENCOUNTER — Encounter: Payer: Self-pay | Admitting: Cardiovascular Disease

## 2013-06-27 ENCOUNTER — Telehealth: Payer: Self-pay | Admitting: Cardiovascular Disease

## 2013-06-27 VITALS — BP 124/68 | HR 71 | Ht 78.0 in | Wt 307.0 lb

## 2013-06-27 DIAGNOSIS — I2510 Atherosclerotic heart disease of native coronary artery without angina pectoris: Secondary | ICD-10-CM

## 2013-06-27 DIAGNOSIS — I498 Other specified cardiac arrhythmias: Secondary | ICD-10-CM

## 2013-06-27 DIAGNOSIS — I5032 Chronic diastolic (congestive) heart failure: Secondary | ICD-10-CM

## 2013-06-27 DIAGNOSIS — I471 Supraventricular tachycardia: Secondary | ICD-10-CM

## 2013-06-27 DIAGNOSIS — I10 Essential (primary) hypertension: Secondary | ICD-10-CM

## 2013-06-27 DIAGNOSIS — Z01810 Encounter for preprocedural cardiovascular examination: Secondary | ICD-10-CM

## 2013-06-27 NOTE — Telephone Encounter (Signed)
Received request from Nurse fax box, documents faxed for surgical clearance. To: [facility] Fax number: [phone] Attention:

## 2013-06-27 NOTE — Progress Notes (Signed)
History of Present Illness: 73 yo WM with h/o CAD, DM, HLD, SVT here today for cardiac follow up. [name] has been followed in the past by Dr. Olevia Perches. [name] had a remote PTCA and DCA of the LAD. [name] underwent catheterization in 2000 because of anginal chest pain and was found to have nonobstructive CAD. 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 [name] couldn't afford the latter and later Cardizem was discontinued and [name] is currently only on metoprolol. [name] has a history of SVT ablation by Dr. Lovena Le. Echo on 03/02/12 with moderate LVH, normal LV function. Exercise tolerance testing 03/02/12 and [name] exercised for 4 minutes without EKG changes suggestive of ischemia. [name] was admitted to [facility] 10/03/12 with c/o lower ext edema, funny feeling in chest. Cardiac markers negative. CTA negative for PE. Tele without SVT. Echo with normal LV size and function. [name] was diuresed with IV Lasix and felt better.   [name] is here today for follow up. [name] is feeling well. No chest pain or SOB.  No dizziness. No near syncope. [name] has had recent upper respiratory symptoms. [name] is planning an eyelid surgery on 07/17/13 with Dr. Lorina Rabon.   Primary Care Physician: [name]    Last Lipid Profile: Followed in primary care.   Past Medical History  Diagnosis Date  . Diabetes mellitus   . Hyperlipidemia   . Hypertension   . S/P ablation of ventricular arrhythmia   . GERD (gastroesophageal reflux disease)   . History of kidney stones   . Coronary artery disease CARDIOLOGIST - DR Lauree Chandler-  LAST VISIT 01-27-2011  IN [facility]  . Right ureteral stone   . Bilateral kidney stones   . SVT (supraventricular tachycardia)     s/p ablation per Dr. Lovena Le 10 -65yrs ago  . OSA on CPAP     cpap setting of 16  . Arthritis     Past Surgical History  Procedure Laterality Date  . Right ureteroscopic stone extraction  09-26-2010  . Circumcision and fulgeration of condyloma  06-22-2003  . Cardiac  electrophysiology study and ablation  2002  . Transthoracic echocardiogram  03-03-2011    MODERATE LVH/ LVSF NORMAL / EF 60-65%/ MILDLY DILATED LEFT ATRIUMK  . Severeal ureteroscopic stone extractions    . Ureteroscopy  11/13/2011    Procedure: URETEROSCOPY;  Surgeon: Claybon Jabs, MD;  Location: [facility];  Service: Urology;  Laterality: Right;  RIGHT URETEROSCOPY WITH HOLMIUM LASER LIHTOTRIPSY DIGITAL URETEROSCOPE  . Coronary angioplasty  1994    [place] OF THE LAD  . Cardiac catheterization  04-27-2007    CAD WITH 30% NARROWING IN THE MID-LAD/ 50 % NARROWING PROXIMAL CIRCUMFLEX/ 40% NARROWING SECOND MARGINAL BRANCH/ 40-50% PROXIMAL RCA WITH DISTAL POSTERIOR NARROWING/ NORMAL LVF  . Cardiac catheterization  2000    NON-OBSTRUCTIVE CAD  . Laparoscopic cholecystectomy  03-26-2005  . Total knee arthroplasty  07/18/2012    Procedure: TOTAL KNEE ARTHROPLASTY;  Surgeon: Johnn Hai, MD;  Location: [facility];  Service: Orthopedics;  Laterality: Right;    Current Outpatient Prescriptions  Medication Sig Dispense Refill  . aspirin EC 81 MG tablet Take 81 mg by mouth daily.      Marland Kitchen atorvastatin (LIPITOR) 20 MG tablet Take 20 mg by mouth at bedtime.      Marland Kitchen doxazosin (CARDURA) 2 MG tablet Take 2 mg by mouth daily before breakfast.       . furosemide (LASIX) 20 MG  tablet Take 3 tablets (60 mg total) by mouth daily.  90 tablet  6  . lisinopril (PRINIVIL,ZESTRIL) 10 MG tablet Take 10 mg by mouth every evening.      . metFORMIN (GLUCOPHAGE) 1000 MG tablet Take 1,000 mg by mouth daily with breakfast.       . metoprolol (LOPRESSOR) 100 MG tablet Take 100-200 mg by mouth 2 (two) times daily. Takes 2 tablets at breakfast and 1 tablet at night      . Multiple Vitamins-Minerals (CENTRUM SILVER ADULT 50+) TABS Take by mouth daily.      . Multiple Vitamins-Minerals (ICAPS AREDS FORMULA PO) Take 1 capsule by mouth 2 (two) times daily.      Marland Kitchen NOVOLIN 70/30 RELION (70-30) 100 UNIT/ML injection Inject  30-60 Units into the skin 2 (two) times daily with a meal. Takes 30 units in the morning and 60 units at night      . omeprazole (PRILOSEC) 20 MG capsule Take 20 mg by mouth 2 (two) times daily before a meal.       . potassium chloride SA (K-DUR,KLOR-CON) 20 MEQ tablet Take 1 tablet (20 mEq total) by mouth daily.  30 tablet  6   No current facility-administered medications for this visit.    Allergies  Allergen Reactions  . Codeine Itching  . Penicillins Rash    History   Social History  . Marital Status: Married    Spouse Name: N/A    Number of Children: N/A  . Years of Education: N/A   Occupational History  . Not on file.   Social History Main Topics  . Smoking status: Former Smoker -- 1.00 packs/day for 15 years    Types: Cigarettes    Quit date: 06/15/1986  . Smokeless tobacco: Never Used  . Alcohol Use: Yes     Comment: occasional  . Drug Use: Not on file  . Sexual Activity: Not on file   Other Topics Concern  . Not on file   Social History Narrative  . No narrative on file    Family History  Problem Relation Age of Onset  . Heart attack Mother   . Heart attack Brother   .     Marland Kitchen Colon cancer Neg Hx   . Stomach cancer Neg Hx     Review of Systems:  As stated in the HPI and otherwise negative.   BP 124/68  Pulse 71  Ht 6\' 6"  (1.981 m)  Wt 307 lb (139.254 kg)  BMI 35.48 kg/m2  Physical Examination: General: Well developed, well nourished, NAD HEENT: OP clear, mucus membranes moist SKIN: warm, dry. No rashes. Neuro: No focal deficits Musculoskeletal: Muscle strength 5/5 all ext Psychiatric: Mood and affect normal Neck: No JVD, no carotid bruits, no thyromegaly, no lymphadenopathy. Lungs:Clear bilaterally, no wheezes, rhonci, crackles Cardiovascular: Regular rate and rhythm. No murmurs, gallops or rubs. Abdomen:Soft. Bowel sounds present. Non-tender.  Extremities: No lower extremity edema. Pulses are 2 + in the bilateral DP/PT.  EKG: NSR, rate 71  bpm. Probable old inferior infarct (Q-waves)  Echo 10/04/12:  Left ventricle: The cavity size was mildly dilated. Wall thickness was increased in a pattern of mild LVH. Systolic function was normal. The estimated ejection fraction was in the range of 55% to 60%. Wall motion was normal; there were no regional wall motion abnormalities. Doppler parameters are consistent with abnormal left ventricular relaxation (grade 1 diastolic dysfunction). - Left atrium: The atrium was mildly dilated.  Assessment and Plan:  Pre-operative cardiovascular risk assessment: Pt is stable from a cardiac standpoint. No recent angina or symptoms of CHF. [name] can proceed with his planned surgical procedure without further cardiac workup.   1. CAD: Stable. Continue current meds.   2. Hyperlipidemia: Continue statin. Followed in primary care.   3. SVT: [name] has had no palpitations. Continue beta blocker.   4. Chronic diastolic CHF: Volume status ok. Continue Lasix 60 mg po Qdaily. [name] has chronic LE edema. If his weight increases or his LE edema worsens, [name] will call and we would increase his Lasix.

## 2013-06-27 NOTE — Patient Instructions (Signed)
Your physician wants you to follow-up in:  6 months. You will receive a reminder letter in the mail two months in advance. If you don't receive a letter, please call our office to schedule the follow-up appointment.   

## 2013-06-29 ENCOUNTER — Other Ambulatory Visit: Payer: Self-pay | Admitting: Cardiovascular Disease

## 2013-07-17 ENCOUNTER — Other Ambulatory Visit: Payer: Self-pay | Admitting: Ophthalmology

## 2013-09-04 ENCOUNTER — Other Ambulatory Visit: Payer: Self-pay | Admitting: Ophthalmology

## 2013-10-11 ENCOUNTER — Ambulatory Visit (INDEPENDENT_AMBULATORY_CARE_PROVIDER_SITE_OTHER): Payer: Self-pay

## 2013-10-11 ENCOUNTER — Ambulatory Visit (INDEPENDENT_AMBULATORY_CARE_PROVIDER_SITE_OTHER): Payer: Medicare Other | Admitting: Neurology

## 2013-10-11 ENCOUNTER — Encounter (INDEPENDENT_AMBULATORY_CARE_PROVIDER_SITE_OTHER): Payer: Self-pay

## 2013-10-11 DIAGNOSIS — G5600 Carpal tunnel syndrome, unspecified upper limb: Secondary | ICD-10-CM

## 2013-10-11 DIAGNOSIS — Z0289 Encounter for other administrative examinations: Secondary | ICD-10-CM

## 2013-10-11 NOTE — Procedures (Signed)
     HISTORY:  [name] is a 73 year old gentleman with a ten-year history of diabetes and a diabetic peripheral neuropathy. Within the last year, he has had some numbness of the hands bilaterally, without neck pain or pain radiating down the arm from the neck. He indicates that the symptoms have worsened over the last 6 months. The patient is being evaluated for a possible neuropathy or a cervical radiculopathy.  NERVE CONDUCTION STUDIES:  Nerve conduction studies were performed on both upper extremities. The distal motor latencies for the median nerves were prolonged bilaterally, with a borderline normal motor amplitude on the right, normal motor amplitude on the left. The distal motor latencies and motor amplitudes for the ulnar nerves were normal bilaterally. The F wave latencies for the median nerves were prolonged bilaterally, normal for the ulnar nerves bilaterally. The nerve conduction velocities for the median and ulnar nerves were normal bilaterally. The sensory latencies for the median nerves were prolonged bilaterally, normal for the ulnar nerves bilaterally.  EMG STUDIES:  EMG study was performed on the right upper extremity:  The first dorsal interosseous muscle reveals 2 to 4 K units with full recruitment. No fibrillations or positive waves were noted. The abductor pollicis brevis muscle reveals 2 to 5 K units with decreased recruitment. One plus fibrillations and positive waves were noted. The extensor indicis proprius muscle reveals 1 to 3 K units with full recruitment. No fibrillations or positive waves were noted. The pronator teres muscle reveals 2 to 3 K units with full recruitment. No fibrillations or positive waves were noted. The biceps muscle reveals 1 to 2 K units with full recruitment. No fibrillations or positive waves were noted. The triceps muscle reveals 2 to 4 K units with full recruitment. No fibrillations or positive waves were noted. The anterior deltoid  muscle reveals 2 to 3 K units with full recruitment. No fibrillations or positive waves were noted. The cervical paraspinal muscles were tested at 2 levels. No abnormalities of insertional activity were seen at either level tested. There was good relaxation.  A limited EMG study was performed on the left upper extremity:  The first dorsal interosseous muscle reveals 2 to 4 K units with full recruitment. No fibrillations or positive waves were noted. The abductor pollicis brevis muscle reveals 2 to 5 K units with decreased recruitment. No fibrillations or positive waves were noted. The extensor indicis proprius muscle reveals 1 to 3 K units with full recruitment. No fibrillations or positive waves were noted.    IMPRESSION:  Nerve conduction studies done on both upper extremities shows evidence of bilateral carpal tunnel syndrome of mild to moderate severity on the right, mild severity on the left. EMG evaluation of the right upper extremity was notable for findings consistent with carpal tunnel syndrome without evidence of an overlying cervical radiculopathy. A limited EMG of the left upper extremity shows findings consistent with carpal tunnel syndrome.  Jill Alexanders MD 10/11/2013 10:55 AM  [facility] [address] [place], [place] [address]  Phone [phone] Fax [phone]

## 2013-11-02 ENCOUNTER — Ambulatory Visit: Payer: Medicare Other | Attending: Internal Medicine | Admitting: Occupational Therapy

## 2013-11-02 DIAGNOSIS — R279 Unspecified lack of coordination: Secondary | ICD-10-CM | POA: Diagnosis not present

## 2013-11-02 DIAGNOSIS — M25549 Pain in joints of unspecified hand: Secondary | ICD-10-CM | POA: Insufficient documentation

## 2013-11-02 DIAGNOSIS — IMO0001 Reserved for inherently not codable concepts without codable children: Secondary | ICD-10-CM | POA: Diagnosis not present

## 2013-11-02 DIAGNOSIS — G5600 Carpal tunnel syndrome, unspecified upper limb: Secondary | ICD-10-CM | POA: Diagnosis not present

## 2013-11-07 ENCOUNTER — Ambulatory Visit: Payer: Medicare Other | Admitting: Occupational Therapy

## 2013-11-07 DIAGNOSIS — IMO0001 Reserved for inherently not codable concepts without codable children: Secondary | ICD-10-CM | POA: Diagnosis not present

## 2013-11-08 ENCOUNTER — Ambulatory Visit: Payer: Medicare Other | Admitting: Occupational Therapy

## 2013-11-09 ENCOUNTER — Ambulatory Visit: Payer: Medicare Other | Admitting: Occupational Therapy

## 2013-11-09 DIAGNOSIS — IMO0001 Reserved for inherently not codable concepts without codable children: Secondary | ICD-10-CM | POA: Diagnosis not present

## 2013-11-13 ENCOUNTER — Ambulatory Visit: Payer: Medicare Other | Attending: Internal Medicine | Admitting: Occupational Therapy

## 2013-11-13 DIAGNOSIS — G5600 Carpal tunnel syndrome, unspecified upper limb: Secondary | ICD-10-CM | POA: Diagnosis not present

## 2013-11-13 DIAGNOSIS — IMO0001 Reserved for inherently not codable concepts without codable children: Secondary | ICD-10-CM | POA: Diagnosis not present

## 2013-11-13 DIAGNOSIS — R279 Unspecified lack of coordination: Secondary | ICD-10-CM | POA: Diagnosis not present

## 2013-11-13 DIAGNOSIS — M25549 Pain in joints of unspecified hand: Secondary | ICD-10-CM | POA: Insufficient documentation

## 2013-11-16 ENCOUNTER — Ambulatory Visit: Payer: Medicare Other | Admitting: Occupational Therapy

## 2013-11-16 DIAGNOSIS — IMO0001 Reserved for inherently not codable concepts without codable children: Secondary | ICD-10-CM | POA: Diagnosis not present

## 2013-11-20 ENCOUNTER — Other Ambulatory Visit: Payer: Self-pay | Admitting: Ophthalmology

## 2013-11-20 ENCOUNTER — Ambulatory Visit: Payer: Medicare Other | Admitting: Occupational Therapy

## 2013-11-20 DIAGNOSIS — IMO0001 Reserved for inherently not codable concepts without codable children: Secondary | ICD-10-CM | POA: Diagnosis not present

## 2013-11-23 ENCOUNTER — Ambulatory Visit: Payer: Medicare Other | Admitting: Occupational Therapy

## 2013-11-23 DIAGNOSIS — IMO0001 Reserved for inherently not codable concepts without codable children: Secondary | ICD-10-CM | POA: Diagnosis not present

## 2013-11-27 ENCOUNTER — Ambulatory Visit: Payer: Medicare Other | Admitting: Occupational Therapy

## 2013-11-30 ENCOUNTER — Ambulatory Visit: Payer: Medicare Other | Admitting: Occupational Therapy

## 2013-11-30 DIAGNOSIS — IMO0001 Reserved for inherently not codable concepts without codable children: Secondary | ICD-10-CM | POA: Diagnosis not present

## 2013-12-04 ENCOUNTER — Ambulatory Visit: Payer: Medicare Other | Admitting: Occupational Therapy

## 2013-12-04 DIAGNOSIS — IMO0001 Reserved for inherently not codable concepts without codable children: Secondary | ICD-10-CM | POA: Diagnosis not present

## 2013-12-07 ENCOUNTER — Ambulatory Visit: Payer: Medicare Other | Admitting: Occupational Therapy

## 2013-12-07 DIAGNOSIS — IMO0001 Reserved for inherently not codable concepts without codable children: Secondary | ICD-10-CM | POA: Diagnosis not present

## 2013-12-11 ENCOUNTER — Encounter: Payer: Medicare Other | Admitting: Occupational Therapy

## 2013-12-14 ENCOUNTER — Encounter: Payer: Medicare Other | Admitting: Occupational Therapy

## 2014-01-09 ENCOUNTER — Encounter: Payer: Self-pay | Admitting: Cardiovascular Disease

## 2014-01-09 ENCOUNTER — Ambulatory Visit (INDEPENDENT_AMBULATORY_CARE_PROVIDER_SITE_OTHER): Payer: Medicare Other | Admitting: Cardiovascular Disease

## 2014-01-09 VITALS — BP 130/60 | HR 74 | Ht 77.0 in | Wt 312.0 lb

## 2014-01-09 DIAGNOSIS — I498 Other specified cardiac arrhythmias: Secondary | ICD-10-CM

## 2014-01-09 DIAGNOSIS — I471 Supraventricular tachycardia, unspecified: Secondary | ICD-10-CM

## 2014-01-09 DIAGNOSIS — I5031 Acute diastolic (congestive) heart failure: Secondary | ICD-10-CM

## 2014-01-09 DIAGNOSIS — I509 Heart failure, unspecified: Secondary | ICD-10-CM

## 2014-01-09 DIAGNOSIS — I2510 Atherosclerotic heart disease of native coronary artery without angina pectoris: Secondary | ICD-10-CM

## 2014-01-09 DIAGNOSIS — I5033 Acute on chronic diastolic (congestive) heart failure: Secondary | ICD-10-CM

## 2014-01-09 DIAGNOSIS — E785 Hyperlipidemia, unspecified: Secondary | ICD-10-CM

## 2014-01-09 MED ORDER — POTASSIUM CHLORIDE CRYS ER 20 MEQ PO TBCR: 20.0000 meq | EXTENDED_RELEASE_TABLET | Freq: Two times a day (BID) | ORAL | Status: DC

## 2014-01-09 MED ORDER — FUROSEMIDE 20 MG PO TABS: 60.0000 mg | ORAL_TABLET | Freq: Two times a day (BID) | ORAL | Status: DC

## 2014-01-09 NOTE — Progress Notes (Signed)
History of Present Illness: 73 yo WM with h/o CAD, DM, HLD, SVT here today for cardiac follow up. He has been followed in the past by Dr. Olevia Perches. He had a remote PTCA and DCA of the LAD. He underwent catheterization in 2000 because of anginal chest pain and was found to have nonobstructive CAD. 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 He couldn't afford the latter and later Cardizem was discontinued and he is currently only on metoprolol. He has a history of SVT ablation by Dr. Lovena Le. Echo on 03/02/12 with moderate LVH, normal LV function. Exercise tolerance testing 03/02/12 and he exercised for 4 minutes without EKG changes suggestive of ischemia. He was admitted to [facility] 10/03/12 with c/o lower ext edema, funny feeling in chest. Cardiac markers negative. CTA negative for PE. Tele without SVT. Echo with normal LV size and function. He was diuresed with IV Lasix and felt better.   He is here today for follow up. He has gained 23 lbs in last 15 months. Describes worsened lower ext edema. No chest pain or SOB.  No dizziness. No near syncope.   Primary Care Physician: [name]    Last Lipid Profile: Followed in primary care.   Past Medical History  Diagnosis Date  . Diabetes mellitus   . Hyperlipidemia   . Hypertension   . S/P ablation of ventricular arrhythmia   . GERD (gastroesophageal reflux disease)   . History of kidney stones   . Coronary artery disease CARDIOLOGIST - DR Lauree Chandler-  LAST VISIT 01-27-2011  IN [facility]  . Right ureteral stone   . Bilateral kidney stones   . SVT (supraventricular tachycardia)     s/p ablation per Dr. Lovena Le 10 -25yrs ago  . OSA on CPAP     cpap setting of 16  . Arthritis     Past Surgical History  Procedure Laterality Date  . Right ureteroscopic stone extraction  09-26-2010  . Circumcision and fulgeration of condyloma  06-22-2003  . Cardiac electrophysiology study and ablation  2002  .  Transthoracic echocardiogram  03-03-2011    MODERATE LVH/ LVSF NORMAL / EF 60-65%/ MILDLY DILATED LEFT ATRIUMK  . Severeal ureteroscopic stone extractions    . Ureteroscopy  11/13/2011    Procedure: URETEROSCOPY;  Surgeon: Claybon Jabs, MD;  Location: [facility];  Service: Urology;  Laterality: Right;  RIGHT URETEROSCOPY WITH HOLMIUM LASER LIHTOTRIPSY DIGITAL URETEROSCOPE  . Coronary angioplasty  1994    [place] OF THE LAD  . Cardiac catheterization  04-27-2007    CAD WITH 30% NARROWING IN THE MID-LAD/ 50 % NARROWING PROXIMAL CIRCUMFLEX/ 40% NARROWING SECOND MARGINAL BRANCH/ 40-50% PROXIMAL RCA WITH DISTAL POSTERIOR NARROWING/ NORMAL LVF  . Cardiac catheterization  2000    NON-OBSTRUCTIVE CAD  . Laparoscopic cholecystectomy  03-26-2005  . Total knee arthroplasty  07/18/2012    Procedure: TOTAL KNEE ARTHROPLASTY;  Surgeon: Johnn Hai, MD;  Location: [facility];  Service: Orthopedics;  Laterality: Right;    Current Outpatient Prescriptions  Medication Sig Dispense Refill  . aspirin EC 81 MG tablet Take 81 mg by mouth daily.      Marland Kitchen atorvastatin (LIPITOR) 20 MG tablet Take 20 mg by mouth at bedtime.      Marland Kitchen doxazosin (CARDURA) 2 MG tablet Take 2 mg by mouth daily before breakfast.       . insulin NPH-regular Human (NOVOLIN 70/30) (70-30) 100 UNIT/ML injection Inject into the  skin.      . lisinopril (PRINIVIL,ZESTRIL) 20 MG tablet Take 20 mg by mouth daily.      . metFORMIN (GLUCOPHAGE) 1000 MG tablet Take 1,000 mg by mouth daily with breakfast.       . metoprolol (LOPRESSOR) 100 MG tablet Take 100-200 mg by mouth 2 (two) times daily. Takes 2 tablets at breakfast and 1 tablet at night      . Multiple Vitamins-Minerals (OCUVITE PRESERVISION PO) Take by mouth.      Marland Kitchen omeprazole (PRILOSEC) 20 MG capsule Take 20 mg by mouth 2 (two) times daily before a meal.       . potassium chloride SA (K-DUR,KLOR-CON) 20 MEQ tablet Take 1 tablet (20 mEq total) by mouth daily.  30 tablet  6   No  current facility-administered medications for this visit.    Allergies  Allergen Reactions  . Codeine Itching  . Penicillins Rash    History   Social History  . Marital Status: Married    Spouse Name: N/A    Number of Children: N/A  . Years of Education: N/A   Occupational History  . Not on file.   Social History Main Topics  . Smoking status: Former Smoker -- 1.00 packs/day for 15 years    Types: Cigarettes    Quit date: 06/15/1986  . Smokeless tobacco: Never Used  . Alcohol Use: Yes     Comment: occasional  . Drug Use: Not on file  . Sexual Activity: Not on file   Other Topics Concern  . Not on file   Social History Narrative  . No narrative on file    Family History  Problem Relation Age of Onset  . Heart attack Mother   . Heart attack Brother   .     Marland Kitchen Colon cancer Neg Hx   . Stomach cancer Neg Hx     Review of Systems:  As stated in the HPI and otherwise negative.   BP 130/60  Pulse 74  Ht 6\' 5"  (1.956 m)  Wt 312 lb (141.522 kg)  BMI 36.99 kg/m2  Physical Examination: General: Well developed, well nourished, NAD HEENT: OP clear, mucus membranes moist SKIN: warm, dry. No rashes. Neuro: No focal deficits Musculoskeletal: Muscle strength 5/5 all ext Psychiatric: Mood and affect normal Neck: No JVD, no carotid bruits, no thyromegaly, no lymphadenopathy. Lungs:Clear bilaterally, no wheezes, rhonci, crackles Cardiovascular: Regular rate and rhythm. No murmurs, gallops or rubs. Abdomen:Soft. Bowel sounds present. Non-tender.  Extremities: No lower extremity edema. Pulses are 2 + in the bilateral DP/PT.  Echo 10/04/12: Left ventricle: The cavity size was mildly dilated. Wall thickness was increased in a pattern of mild LVH. Systolic function was normal. The estimated ejection fraction was in the range of 55% to 60%. Wall motion was normal; there were no regional wall motion abnormalities. Doppler parameters are consistent with abnormal left  ventricular relaxation (grade 1 diastolic dysfunction). - Left atrium: The atrium was mildly dilated.   Assessment and Plan:   1. CAD: Stable. Continue current meds.   2. Hyperlipidemia: Continue statin. Lipids followed in primary care.   3. SVT: He has had no palpitations. Continue beta blocker.   4. Acute on Chronic diastolic CHF: He seems to be more volume overloaded. LE edema is increased. Weight is up 23 lbs since April 2014 up 5 lbs since January 2015. Will increase Lasix to 60 mg po BID for next 2 weeks. Increase KDur to 20 meq po BID. BMET today.  Follow up in 1-2 weeks with office NP/PA for recheck and repeat BMET.

## 2014-01-09 NOTE — Patient Instructions (Signed)
Your physician recommends that you schedule a follow-up appointment in:  About 10 days with NP or PA.  Your physician has recommended you make the following change in your medication:  Increase furosemide to 60 mg by mouth twice daily. Increase potassium to 20 meq by mouth twice daily.

## 2014-01-10 LAB — BASIC METABOLIC PANEL
BUN: 28 mg/dL — ABNORMAL HIGH (ref 6–23)
CO2: 20 mEq/L (ref 19–32)
Calcium: 9.1 mg/dL (ref 8.4–10.5)
Chloride: 108 mEq/L (ref 96–112)
Creatinine, Ser: 1.4 mg/dL (ref 0.4–1.5)
GFR: 52.03 mL/min — AB (ref >60.00)
Glucose, Bld: 283 mg/dL — ABNORMAL HIGH (ref 70–99)
Potassium: 4.6 mEq/L (ref 3.5–5.1)
SODIUM: 139 meq/L (ref 135–145)

## 2014-01-12 ENCOUNTER — Inpatient Hospital Stay (HOSPITAL_COMMUNITY): Payer: Medicare Other

## 2014-01-12 ENCOUNTER — Encounter (HOSPITAL_COMMUNITY): Payer: Self-pay | Admitting: Emergency Medicine

## 2014-01-12 ENCOUNTER — Telehealth: Payer: Self-pay | Admitting: Cardiovascular Disease

## 2014-01-12 ENCOUNTER — Inpatient Hospital Stay (HOSPITAL_COMMUNITY)
Admission: EM | Admit: 2014-01-12 | Discharge: 2014-01-13 | DRG: 683 | Disposition: A | Discharge status: Home / Self Care | Payer: Medicare Other | Attending: Internal Medicine | Admitting: Internal Medicine

## 2014-01-12 DIAGNOSIS — K219 Gastro-esophageal reflux disease without esophagitis: Secondary | ICD-10-CM | POA: Diagnosis present

## 2014-01-12 DIAGNOSIS — Z87891 Personal history of nicotine dependence: Secondary | ICD-10-CM

## 2014-01-12 DIAGNOSIS — I509 Heart failure, unspecified: Secondary | ICD-10-CM | POA: Diagnosis present

## 2014-01-12 DIAGNOSIS — M129 Arthropathy, unspecified: Secondary | ICD-10-CM | POA: Diagnosis present

## 2014-01-12 DIAGNOSIS — I119 Hypertensive heart disease without heart failure: Secondary | ICD-10-CM

## 2014-01-12 DIAGNOSIS — Z87442 Personal history of urinary calculi: Secondary | ICD-10-CM

## 2014-01-12 DIAGNOSIS — Z794 Long term (current) use of insulin: Secondary | ICD-10-CM

## 2014-01-12 DIAGNOSIS — T502X5A Adverse effect of carbonic-anhydrase inhibitors, benzothiadiazides and other diuretics, initial encounter: Secondary | ICD-10-CM | POA: Diagnosis present

## 2014-01-12 DIAGNOSIS — Z7982 Long term (current) use of aspirin: Secondary | ICD-10-CM | POA: Diagnosis not present

## 2014-01-12 DIAGNOSIS — I5032 Chronic diastolic (congestive) heart failure: Secondary | ICD-10-CM | POA: Diagnosis present

## 2014-01-12 DIAGNOSIS — I872 Venous insufficiency (chronic) (peripheral): Secondary | ICD-10-CM | POA: Diagnosis present

## 2014-01-12 DIAGNOSIS — I2510 Atherosclerotic heart disease of native coronary artery without angina pectoris: Secondary | ICD-10-CM | POA: Diagnosis present

## 2014-01-12 DIAGNOSIS — G4733 Obstructive sleep apnea (adult) (pediatric): Secondary | ICD-10-CM | POA: Diagnosis present

## 2014-01-12 DIAGNOSIS — E119 Type 2 diabetes mellitus without complications: Secondary | ICD-10-CM | POA: Diagnosis present

## 2014-01-12 DIAGNOSIS — I10 Essential (primary) hypertension: Secondary | ICD-10-CM | POA: Diagnosis present

## 2014-01-12 DIAGNOSIS — N179 Acute kidney failure, unspecified: Secondary | ICD-10-CM | POA: Diagnosis not present

## 2014-01-12 DIAGNOSIS — E785 Hyperlipidemia, unspecified: Secondary | ICD-10-CM | POA: Diagnosis present

## 2014-01-12 LAB — URINE MICROSCOPIC-ADD ON

## 2014-01-12 LAB — CBC
HEMATOCRIT: 40.4 % (ref 39.0–52.0)
Hemoglobin: 14 g/dL (ref 13.0–17.0)
MCH: 34.5 pg — AB (ref 26.0–34.0)
MCHC: 34.7 g/dL (ref 30.0–36.0)
MCV: 99.5 fL (ref 78.0–100.0)
Platelets: 187 10*3/uL (ref 150–400)
RBC: 4.06 MIL/uL — AB (ref 4.22–5.81)
RDW: 13.1 % (ref 11.5–15.5)
WBC: 9.8 10*3/uL (ref 4.0–10.5)

## 2014-01-12 LAB — URINALYSIS, ROUTINE W REFLEX MICROSCOPIC
BILIRUBIN URINE: NEGATIVE
GLUCOSE, UA: NEGATIVE mg/dL
Ketones, ur: NEGATIVE mg/dL
NITRITE: NEGATIVE
Protein, ur: NEGATIVE mg/dL
SPECIFIC GRAVITY, URINE: 1.017 (ref 1.005–1.030)
Urobilinogen, UA: 0.2 mg/dL (ref 0.0–1.0)
pH: 5 (ref 5.0–8.0)

## 2014-01-12 LAB — GLUCOSE, CAPILLARY
Glucose-Capillary: 159 mg/dL — ABNORMAL HIGH (ref 70–99)
Glucose-Capillary: 242 mg/dL — ABNORMAL HIGH (ref 70–99)
Glucose-Capillary: 208 mg/dL — ABNORMAL HIGH (ref 70–99)

## 2014-01-12 LAB — BASIC METABOLIC PANEL
Anion gap: 15 (ref 5–15)
BUN: 56 mg/dL — ABNORMAL HIGH (ref 6–23)
CO2: 20 mEq/L (ref 19–32)
Calcium: 8.5 mg/dL (ref 8.4–10.5)
Chloride: 102 mEq/L (ref 96–112)
Creatinine, Ser: 2.15 mg/dL — ABNORMAL HIGH (ref 0.50–1.35)
GFR calc Af Amer: 34 mL/min — ABNORMAL LOW (ref >90)
GFR calc non Af Amer: 29 mL/min — ABNORMAL LOW (ref >90)
Glucose, Bld: 234 mg/dL — ABNORMAL HIGH (ref 70–99)
Potassium: 5.4 mEq/L — ABNORMAL HIGH (ref 3.7–5.3)
Sodium: 137 mEq/L (ref 137–147)

## 2014-01-12 LAB — CK: Total CK: 144 U/L (ref 7–232)

## 2014-01-12 LAB — PRO B NATRIURETIC PEPTIDE: Pro B Natriuretic peptide (BNP): 67.7 pg/mL (ref 0–125)

## 2014-01-12 MED ORDER — INSULIN ASPART PROT & ASPART (70-30 MIX) 100 UNIT/ML ~~LOC~~ SUSP
60.0000 [IU] | Freq: Every day | SUBCUTANEOUS | Status: DC
  Administered 2014-01-12: 60 [IU] via SUBCUTANEOUS
  Filled 2014-01-12: qty 10

## 2014-01-12 MED ORDER — ACETAMINOPHEN 650 MG RE SUPP: 650.0000 mg | Freq: Four times a day (QID) | RECTAL | Status: DC | PRN

## 2014-01-12 MED ORDER — ENOXAPARIN SODIUM 30 MG/0.3ML ~~LOC~~ SOLN
30.0000 mg | SUBCUTANEOUS | Status: DC
  Administered 2014-01-12: 30 mg via SUBCUTANEOUS
  Filled 2014-01-12 (×2): qty 0.3

## 2014-01-12 MED ORDER — PANTOPRAZOLE SODIUM 40 MG PO TBEC: 40.0000 mg | DELAYED_RELEASE_TABLET | Freq: Every day | ORAL | Status: DC

## 2014-01-12 MED ORDER — ATORVASTATIN CALCIUM 20 MG PO TABS
20.0000 mg | ORAL_TABLET | Freq: Every day | ORAL | Status: DC
  Administered 2014-01-12: 20 mg via ORAL
  Filled 2014-01-12 (×3): qty 1

## 2014-01-12 MED ORDER — INSULIN ASPART 100 UNIT/ML ~~LOC~~ SOLN
0.0000 [IU] | Freq: Three times a day (TID) | SUBCUTANEOUS | Status: DC
  Administered 2014-01-12: 5 [IU] via SUBCUTANEOUS
  Administered 2014-01-13: 2 [IU] via SUBCUTANEOUS

## 2014-01-12 MED ORDER — DOXAZOSIN MESYLATE 2 MG PO TABS
2.0000 mg | ORAL_TABLET | Freq: Every day | ORAL | Status: DC
  Administered 2014-01-13: 2 mg via ORAL
  Filled 2014-01-12 (×2): qty 1

## 2014-01-12 MED ORDER — ASPIRIN EC 81 MG PO TBEC
81.0000 mg | DELAYED_RELEASE_TABLET | Freq: Every day | ORAL | Status: DC
  Filled 2014-01-12: qty 1

## 2014-01-12 MED ORDER — ACETAMINOPHEN 325 MG PO TABS: 650.0000 mg | ORAL_TABLET | Freq: Four times a day (QID) | ORAL | Status: DC | PRN

## 2014-01-12 MED ORDER — SODIUM CHLORIDE 0.9 % IV BOLUS (SEPSIS)
1000.0000 mL | Freq: Once | INTRAVENOUS | Status: AC
  Administered 2014-01-12: 1000 mL via INTRAVENOUS

## 2014-01-12 MED ORDER — METOPROLOL TARTRATE 100 MG PO TABS
100.0000 mg | ORAL_TABLET | Freq: Two times a day (BID) | ORAL | Status: DC
  Administered 2014-01-12: 100 mg via ORAL
  Filled 2014-01-12 (×3): qty 1

## 2014-01-12 MED ORDER — ONDANSETRON HCL 4 MG/2ML IJ SOLN: 4.0000 mg | Freq: Four times a day (QID) | INTRAMUSCULAR | Status: DC | PRN

## 2014-01-12 MED ORDER — ONDANSETRON HCL 4 MG PO TABS: 4.0000 mg | ORAL_TABLET | Freq: Four times a day (QID) | ORAL | Status: DC | PRN

## 2014-01-12 MED ORDER — SODIUM CHLORIDE 0.9 % IV SOLN
INTRAVENOUS | Status: DC
  Administered 2014-01-12: 1000 mL via INTRAVENOUS
  Administered 2014-01-13: 04:00:00 via INTRAVENOUS

## 2014-01-12 MED ORDER — INSULIN ASPART PROT & ASPART (70-30 MIX) 100 UNIT/ML ~~LOC~~ SUSP
40.0000 [IU] | Freq: Every day | SUBCUTANEOUS | Status: DC
  Administered 2014-01-13: 40 [IU] via SUBCUTANEOUS
  Filled 2014-01-12: qty 10

## 2014-01-12 NOTE — Telephone Encounter (Signed)
thanks

## 2014-01-12 NOTE — Progress Notes (Signed)
Notified Dr. Broadus John via text page when pt arrived to floor earlier today.  Karie Kirks, Therapist, sports.

## 2014-01-12 NOTE — ED Provider Notes (Signed)
Medical screening examination/treatment/procedure(s) were conducted as a shared visit with non-physician practitioner(s) and myself.  I personally evaluated the patient during the encounter.   EKG Interpretation None      Pt presents w/ dec UOP after lasix increased. On PE, pt in NAD, slight crackles at bases, +BLLE edema. Cr elevated form baseline as well as BUN. Suspect intravascular volume depletion.  [facility] will admit.   Neta Ehlers, MD 01/12/14 2021

## 2014-01-12 NOTE — H&P (Signed)
[facility] History and Physical  [name]:[id number] DOB: January 15, 1941 DOA: 01/12/2014  Referring physician: EDP PCP: [name], MD   Chief Complaint: decreased urination  HPI: [name] is a 73 y.o. male with PMH of CAD, chronic diastolic CHF, DM, HTN presents to the ER with the above complaints. He was seen in Cardiology Clinic 3 days ago and then his dose of lasix was doubled from 60mg  daily to 60mg  BID due to 15 lb weight gain in 1 year and some leg swelling. He had no dyspnea, orthopnea noted at the time. 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 He urinated a lot on Tuesday night and early Wednesday, subsequently noticed that he urinated only a spoonful yesterday all day. He called cardiology office and was asked to come to the ER. In ER, noted to have AKI, given 2L fluid bolus and TRH consulted, Bladder scan with <20cc urine noted   Review of Systems:  Constitutional:  No weight loss, night sweats, Fevers, chills, fatigue.  HEENT:  No headaches, Difficulty swallowing,Tooth/dental problems,Sore throat,  No sneezing, itching, ear ache, nasal congestion, post nasal drip,  Cardio-vascular:  No chest pain, Orthopnea, PND, swelling in lower extremities, anasarca, dizziness, palpitations  GI:  No heartburn, indigestion, abdominal pain, nausea, vomiting, diarrhea, change in bowel habits, loss of appetite  Resp:  No shortness of breath with exertion or at rest. No excess mucus, no productive cough, No non-productive cough, No coughing up of blood.No change in color of mucus.No wheezing.No chest wall deformity  Skin:  no rash or lesions.  GU: poor urine output no dysuria, change in color of urine, no urgency or frequency. No flank pain.  Musculoskeletal:  No joint pain or swelling. No decreased range of motion. No back pain.  Psych:  No change in mood or affect. No depression or anxiety. No memory loss.   Past  Medical History  Diagnosis Date  . Diabetes mellitus   . Hyperlipidemia   . Hypertension   . S/P ablation of ventricular arrhythmia   . GERD (gastroesophageal reflux disease)   . History of kidney stones   . Coronary artery disease CARDIOLOGIST - DR Lauree Chandler-  LAST VISIT 01-27-2011  IN [facility]  . Right ureteral stone   . SVT (supraventricular tachycardia)     s/p ablation per Dr. Lovena Le 10 -62yrs ago  . OSA on CPAP     cpap setting of 16  . Arthritis   . Bilateral kidney stones    Past Surgical History  Procedure Laterality Date  . Right ureteroscopic stone extraction  09-26-2010  . Circumcision and fulgeration of condyloma  06-22-2003  . Cardiac electrophysiology study and ablation  2002  . Transthoracic echocardiogram  03-03-2011    MODERATE LVH/ LVSF NORMAL / EF 60-65%/ MILDLY DILATED LEFT ATRIUMK  . Severeal ureteroscopic stone extractions    . Ureteroscopy  11/13/2011    Procedure: URETEROSCOPY;  Surgeon: Claybon Jabs, MD;  Location: [facility];  Service: Urology;  Laterality: Right;  RIGHT URETEROSCOPY WITH HOLMIUM LASER LIHTOTRIPSY DIGITAL URETEROSCOPE  . Coronary angioplasty  1994    [place] OF THE LAD  . Cardiac catheterization  04-27-2007    CAD WITH 30% NARROWING IN THE MID-LAD/ 50 % NARROWING PROXIMAL CIRCUMFLEX/ 40% NARROWING SECOND MARGINAL BRANCH/ 40-50% PROXIMAL RCA WITH DISTAL POSTERIOR NARROWING/ NORMAL LVF  . Cardiac catheterization  2000    NON-OBSTRUCTIVE CAD  . Laparoscopic cholecystectomy  03-26-2005  . Total knee arthroplasty  07/18/2012    Procedure: TOTAL KNEE ARTHROPLASTY;  Surgeon: Johnn Hai, MD;  Location: [facility];  Service: Orthopedics;  Laterality: Right;   Social History:  reports that he quit smoking about 27 years ago. His smoking use included Cigarettes. He has a 15 pack-year smoking history. He has never used smokeless tobacco. He reports that he drinks alcohol. His drug history is not on file.  Allergies  Allergen  Reactions  . Codeine Itching  . Penicillins Rash    Family History  Problem Relation Age of Onset  . Heart attack Mother   . Heart attack Brother   .     Marland Kitchen Colon cancer Neg Hx   . Stomach cancer Neg Hx      Prior to Admission medications   Medication Sig Start Date End Date Taking? Authorizing Provider  aspirin EC 81 MG tablet Take 81 mg by mouth daily.   Yes Historical Provider, MD  atorvastatin (LIPITOR) 20 MG tablet Take 20 mg by mouth at bedtime.   Yes Historical Provider, MD  doxazosin (CARDURA) 2 MG tablet Take 2 mg by mouth daily before breakfast.    Yes Historical Provider, MD  furosemide (LASIX) 20 MG tablet Take 3 tablets (60 mg total) by mouth 2 (two) times daily. 01/09/14  Yes Burnell Blanks, MD  insulin NPH-regular Human (NOVOLIN 70/30) (70-30) 100 UNIT/ML injection Inject 45-65 Units into the skin 2 (two) times daily with a meal. 45 units in am and 65 units in pm   Yes Historical Provider, MD  lisinopril (PRINIVIL,ZESTRIL) 20 MG tablet Take 20 mg by mouth daily.   Yes Historical Provider, MD  metFORMIN (GLUCOPHAGE) 1000 MG tablet Take 1,000 mg by mouth daily with breakfast.    Yes Historical Provider, MD  metoprolol (LOPRESSOR) 100 MG tablet Take 100 mg by mouth 2 (two) times daily. Takes 2 tablets at breakfast and 1 tablet at night   Yes Historical Provider, MD  Multiple Vitamins-Minerals (PRESERVISION AREDS PO) Take 1 tablet by mouth 2 (two) times daily.   Yes Historical Provider, MD  omeprazole (PRILOSEC) 20 MG capsule Take 20 mg by mouth 2 (two) times daily before a meal.    Yes Historical Provider, MD  potassium chloride SA (K-DUR,KLOR-CON) 20 MEQ tablet Take 1 tablet (20 mEq total) by mouth 2 (two) times daily. 01/09/14  Yes Burnell Blanks, MD   Physical Exam: Filed Vitals:   01/12/14 1145 01/12/14 1200 01/12/14 1215 01/12/14 1245  BP: 134/71 118/58 131/73   Pulse: 63 64 69 63  Temp:      TempSrc:      Resp: 19 18 20 15   SpO2: 95% 96% 96% 96%     Wt Readings from Last 3 Encounters:  01/09/14 141.522 kg (312 lb)  06/27/13 139.254 kg (307 lb)  11/28/12 134.265 kg (296 lb)    General:  Appears calm and comfortable, no distress Eyes: PERRL, normal lids, irises & conjunctiva ENT: grossly normal hearing, lips & tongue Neck: no LAD, masses or thyromegaly Cardiovascular: RRR, no m/r/g. No LE edema. Respiratory: CTA bilaterally, no w/r/r. Normal respiratory effort. Abdomen: soft, NT, BS present, obese Skin: no rash or induration seen on limited exam Musculoskeletal: grossly normal tone BUE/BLE, old healed skin lesions on both legs with 1 plus edema  Psychiatric: grossly normal mood and affect, speech fluent and appropriate Neurologic: grossly non-focal.          Labs on Admission:  Basic  Metabolic Panel:  Recent Labs Lab 01/09/14 1708 01/12/14 1021  NA 139 137  K 4.6 5.4*  CL 108 102  CO2 20 20  GLUCOSE 283* 234*  BUN 28* 56*  CREATININE 1.4 2.15*  CALCIUM 9.1 8.5   Liver Function Tests: No results found for this basename: AST, ALT, ALKPHOS, BILITOT, PROT, ALBUMIN,  in the last 168 hours No results found for this basename: LIPASE, AMYLASE,  in the last 168 hours No results found for this basename: AMMONIA,  in the last 168 hours CBC:  Recent Labs Lab 01/12/14 1021  WBC 9.8  HGB 14.0  HCT 40.4  MCV 99.5  PLT 187   Cardiac Enzymes: No results found for this basename: CKTOTAL, CKMB, CKMBINDEX, TROPONINI,  in the last 168 hours  BNP (last 3 results)  Recent Labs  01/12/14 1244  PROBNP 67.7   CBG: No results found for this basename: GLUCAP,  in the last 168 hours  Radiological Exams on Admission: Dg Chest 2 View  01/12/2014   CLINICAL DATA:  Suspected fluid retention. Following medication change  EXAM: CHEST  2 VIEW  COMPARISON:  PA and lateral chest x-ray dated October 03, 2012  FINDINGS: The lungs are adequately inflated. There is no focal infiltrate. There is no pleural effusion. The cardiac  silhouette remains enlarged. The central pulmonary vascularity is prominent but stable. No cephalization is demonstrated. There is no interstitial or alveolar edema. The mediastinum is normal in width. The bony thorax is normal.  IMPRESSION: Stable cardiomegaly and central vascular prominence. There is no acute pulmonary edema.   Electronically Signed   By: David  Martinique   On: 01/12/2014 12:51    EKG: NSR, no acute ST T wave changes  Assessment/Plan     1. AKI -likely due to volume depletion, insensible losses from working in heat, overdiuresis and ACE -check Renal US -check CK -gentle hydration with NS -I have notified Dr.McAlhaney of admission, consult not requested  2. CAD -stable, last stress test in 9/13 without changes of ischemia -continue ASA/metoprololstatin  3. Chronic diastolic CHF -compensated, some of his leg swelling is chronic, likely from venous stasis too -hold diuretics due to 1  4. HTN (hypertension) -stable, hold ACE  5. DM -resume Insulin 70/30 at lower dose, SSI  Code Status: Full Code DVT Prophylaxis: lovenox Family Communication: d/w spouse at bedside Disposition Plan: home hopefully tomorrow  Time spent: 14min  Fahad Cisse [facility] Pager [phone]  **Disclaimer: This note may have been dictated with voice recognition software. Similar sounding words can inadvertently be transcribed and this note may contain transcription errors which may not have been corrected upon publication of note.**

## 2014-01-12 NOTE — Telephone Encounter (Signed)
Received pt call direct from scheduling. Spoke with pt who reports he has not urinated since Wednesday evening. Took lasix yesterday but not this AM. No shortness of breath but increased abdominal girth. Pt instructed to go to ED at [facility]. He is agreeable with this plan.

## 2014-01-12 NOTE — ED Notes (Signed)
Pt reports that he takes 60mg  lasix and recently had the dose increased to a total of 120mg  a day. States that since then he been urinating his normal amount and is concerned about his kidneys. Called his cardiologist and told to come here for evaluation. Denies any pain, reports that he has had increased swelling to bilateral legs.

## 2014-01-12 NOTE — ED Provider Notes (Signed)
CSN: [id number]     Arrival date & time 01/12/14  5885 History   First MD Initiated Contact with Patient 01/12/14 [phone]     Chief Complaint  Patient presents with  . Urinary Retention     (Consider location/radiation/quality/duration/timing/severity/associated sxs/prior Treatment) HPI Comments: Patient with history of congestive heart failure, CAD, SVT, diabetes -- presents with complaint of decreased urination. Patient recently seen by his cardiologist. Due to what appeared to be worsening fluid retention, Dr. Angelena Form increased Lasix to 60 mg twice a day for 2 weeks. Patient began taking this 2 days ago. Yesterday patient did not urinate. He noticed increasing lower abdominal swelling. Today he was in the able to urinate a very small amount. Patient comes in to the emergency department for evaluation. He does not have any abdominal pain or urge to urinate. He denies back pain, weakness in his legs, numbness and tingling in the saddle area. He did not take Lasix this morning. Patient reports working outside yesterday and hydrating well. No other fevers, nausea vomiting, diarrhea. No lightheadedness or syncope. No blood in stool. The onset of this condition was acute. The course is constant. Aggravating factors: none. Alleviating factors: none.    The history is provided by the patient and medical records.    Past Medical History  Diagnosis Date  . Diabetes mellitus   . Hyperlipidemia   . Hypertension   . S/P ablation of ventricular arrhythmia   . GERD (gastroesophageal reflux disease)   . History of kidney stones   . Coronary artery disease CARDIOLOGIST - DR Lauree Chandler-  LAST VISIT 01-27-2011  IN [facility]  . Right ureteral stone   . Bilateral kidney stones   . SVT (supraventricular tachycardia)     s/p ablation per Dr. Lovena Le 10 -46yrs ago  . OSA on CPAP     cpap setting of 16  . Arthritis    Past Surgical History  Procedure Laterality Date  . Right ureteroscopic stone  extraction  09-26-2010  . Circumcision and fulgeration of condyloma  06-22-2003  . Cardiac electrophysiology study and ablation  2002  . Transthoracic echocardiogram  03-03-2011    MODERATE LVH/ LVSF NORMAL / EF 60-65%/ MILDLY DILATED LEFT ATRIUMK  . Severeal ureteroscopic stone extractions    . Ureteroscopy  11/13/2011    Procedure: URETEROSCOPY;  Surgeon: Claybon Jabs, MD;  Location: [facility];  Service: Urology;  Laterality: Right;  RIGHT URETEROSCOPY WITH HOLMIUM LASER LIHTOTRIPSY DIGITAL URETEROSCOPE  . Coronary angioplasty  1994    [place] OF THE LAD  . Cardiac catheterization  04-27-2007    CAD WITH 30% NARROWING IN THE MID-LAD/ 50 % NARROWING PROXIMAL CIRCUMFLEX/ 40% NARROWING SECOND MARGINAL BRANCH/ 40-50% PROXIMAL RCA WITH DISTAL POSTERIOR NARROWING/ NORMAL LVF  . Cardiac catheterization  2000    NON-OBSTRUCTIVE CAD  . Laparoscopic cholecystectomy  03-26-2005  . Total knee arthroplasty  07/18/2012    Procedure: TOTAL KNEE ARTHROPLASTY;  Surgeon: Johnn Hai, MD;  Location: [facility];  Service: Orthopedics;  Laterality: Right;   Family History  Problem Relation Age of Onset  . Heart attack Mother   . Heart attack Brother   .     Marland Kitchen Colon cancer Neg Hx   . Stomach cancer Neg Hx    History  Substance Use Topics  . Smoking status: Former Smoker -- 1.00 packs/day for 15 years    Types: Cigarettes    Quit date: 06/15/1986  . Smokeless tobacco: Never Used  . Alcohol  Use: Yes     Comment: occasional    Review of Systems  Constitutional: Negative for fever and unexpected weight change.  HENT: Negative for rhinorrhea and sore throat.   Eyes: Negative for redness.  Respiratory: Negative for cough.   Cardiovascular: Negative for chest pain.  Gastrointestinal: Positive for abdominal distention. Negative for nausea, vomiting, abdominal pain, diarrhea and constipation.       Neg for fecal incontinence  Genitourinary: Positive for difficulty urinating. Negative for  dysuria, frequency, hematuria and flank pain.       Negative for urinary incontinence or retention  Musculoskeletal: Positive for back pain. Negative for myalgias.  Skin: Negative for rash.  Neurological: Negative for weakness, numbness and headaches.       Negative for saddle paresthesias       Allergies  Codeine and Penicillins  Home Medications   Prior to Admission medications   Medication Sig Start Date End Date Taking? Authorizing Provider  aspirin EC 81 MG tablet Take 81 mg by mouth daily.   Yes Historical Provider, MD  atorvastatin (LIPITOR) 20 MG tablet Take 20 mg by mouth at bedtime.   Yes Historical Provider, MD  doxazosin (CARDURA) 2 MG tablet Take 2 mg by mouth daily before breakfast.    Yes Historical Provider, MD  furosemide (LASIX) 20 MG tablet Take 3 tablets (60 mg total) by mouth 2 (two) times daily. 01/09/14  Yes Burnell Blanks, MD  insulin NPH-regular Human (NOVOLIN 70/30) (70-30) 100 UNIT/ML injection Inject 45-65 Units into the skin 2 (two) times daily with a meal. 45 units in am and 65 units in pm   Yes Historical Provider, MD  lisinopril (PRINIVIL,ZESTRIL) 20 MG tablet Take 20 mg by mouth daily.   Yes Historical Provider, MD  metFORMIN (GLUCOPHAGE) 1000 MG tablet Take 1,000 mg by mouth daily with breakfast.    Yes Historical Provider, MD  metoprolol (LOPRESSOR) 100 MG tablet Take 100 mg by mouth 2 (two) times daily. Takes 2 tablets at breakfast and 1 tablet at night   Yes Historical Provider, MD  Multiple Vitamins-Minerals (PRESERVISION AREDS PO) Take 1 tablet by mouth 2 (two) times daily.   Yes Historical Provider, MD  omeprazole (PRILOSEC) 20 MG capsule Take 20 mg by mouth 2 (two) times daily before a meal.    Yes Historical Provider, MD  potassium chloride SA (K-DUR,KLOR-CON) 20 MEQ tablet Take 1 tablet (20 mEq total) by mouth 2 (two) times daily. 01/09/14  Yes Burnell Blanks, MD   BP 127/61  Pulse 65  Temp(Src) 98.5 F (36.9 C) (Oral)  Resp 19   SpO2 96%  Physical Exam  Nursing note and vitals reviewed. Constitutional: He appears well-developed and well-nourished.  HENT:  Head: Normocephalic and atraumatic.  Eyes: Conjunctivae are normal. Right eye exhibits no discharge. Left eye exhibits no discharge.  Neck: Normal range of motion. Neck supple.  Cardiovascular: Normal rate, regular rhythm and normal heart sounds.   Pulmonary/Chest: Effort normal and breath sounds normal. No respiratory distress. He has no wheezes. He has no rales.  Abdominal: Soft. Bowel sounds are normal. There is no tenderness. There is no rebound and no guarding.  Musculoskeletal: He exhibits tenderness.  1+ LE edema to knees bilaterally.   Neurological: He is alert.  Skin: Skin is warm and dry.  Psychiatric: He has a normal mood and affect.    ED Course  Procedures (including critical care time) Labs Review Labs Reviewed  CBC - Abnormal; Notable for the following:  RBC 4.06 (*)    MCH 34.5 (*)    All other components within normal limits  BASIC METABOLIC PANEL - Abnormal; Notable for the following:    Potassium 5.4 (*)    Glucose, Bld 234 (*)    BUN 56 (*)    Creatinine, Ser 2.15 (*)    GFR calc non Af Amer 29 (*)    GFR calc Af Amer 34 (*)    All other components within normal limits  URINALYSIS, ROUTINE W REFLEX MICROSCOPIC    Imaging Review No results found.   EKG Interpretation None      10:48 AM Patient seen and examined. Work-up initiated. Medications ordered.   Vital signs reviewed and are as follows: Filed Vitals:   01/12/14 0952  BP:   Pulse: 65  Temp:   Resp:   BP 127/61  Pulse 65  Temp(Src) 98.5 F (36.9 C) (Oral)  Resp 19  SpO2 96%  11:26 AM Minimal urine on bladder scan. AKI noted. Pt noted to be on lisinopril and metformin. Will need admission for hydration and monitoring of kidneys.   12:17 PM Pt discussed with and seen by Dr. Tawnya Crook. Spoke with Dr. Broadus John of [facility] who will see, admit.    MDM    Final diagnoses:  Acute kidney injury   Admit for above.    Carlisle Cater, PA-C 01/12/14 1219

## 2014-01-12 NOTE — ED Notes (Signed)
Bladder scanner showed 18 ml of urine.

## 2014-01-12 NOTE — Progress Notes (Signed)
Pt arrived from the ED via stretcher to the floor.  A&0x4.  Wife at the bedside.  Oriented to the room.  Call bell at reach.  Denies any pain/discomfort.  Instructed to call for assistance.  Verbalized understanding.  Karie Kirks, Therapist, sports.

## 2014-01-12 NOTE — Progress Notes (Signed)
Utilization Review Completed.Daylin Eads T7/31/2015  

## 2014-01-12 NOTE — Telephone Encounter (Signed)
New message     Pt states he has had no urine output in 1 1/2 days and he has doubled his lasix.  Please advise

## 2014-01-13 DIAGNOSIS — I509 Heart failure, unspecified: Secondary | ICD-10-CM

## 2014-01-13 DIAGNOSIS — I5032 Chronic diastolic (congestive) heart failure: Secondary | ICD-10-CM

## 2014-01-13 DIAGNOSIS — N179 Acute kidney failure, unspecified: Principal | ICD-10-CM

## 2014-01-13 DIAGNOSIS — I10 Essential (primary) hypertension: Secondary | ICD-10-CM

## 2014-01-13 LAB — CBC
HEMATOCRIT: 37.8 % — AB (ref 39.0–52.0)
Hemoglobin: 12.7 g/dL — ABNORMAL LOW (ref 13.0–17.0)
MCH: 33 pg (ref 26.0–34.0)
MCHC: 33.6 g/dL (ref 30.0–36.0)
MCV: 98.2 fL (ref 78.0–100.0)
PLATELETS: 178 10*3/uL (ref 150–400)
RBC: 3.85 MIL/uL — ABNORMAL LOW (ref 4.22–5.81)
RDW: 13 % (ref 11.5–15.5)
WBC: 9.3 10*3/uL (ref 4.0–10.5)

## 2014-01-13 LAB — BASIC METABOLIC PANEL WITH GFR
Anion gap: 15 (ref 5–15)
BUN: 41 mg/dL — ABNORMAL HIGH (ref 6–23)
CO2: 21 meq/L (ref 19–32)
Calcium: 8.2 mg/dL — ABNORMAL LOW (ref 8.4–10.5)
Chloride: 106 meq/L (ref 96–112)
Creatinine, Ser: 1.3 mg/dL (ref 0.50–1.35)
GFR calc Af Amer: 62 mL/min — ABNORMAL LOW
GFR calc non Af Amer: 53 mL/min — ABNORMAL LOW
Glucose, Bld: 105 mg/dL — ABNORMAL HIGH (ref 70–99)
Potassium: 4.3 meq/L (ref 3.7–5.3)
Sodium: 142 meq/L (ref 137–147)

## 2014-01-13 LAB — GLUCOSE, CAPILLARY: Glucose-Capillary: 144 mg/dL — ABNORMAL HIGH (ref 70–99)

## 2014-01-13 NOTE — Discharge Summary (Signed)
Physician Discharge Summary  [name] XFG:[id number] DOB: 1940/11/30 DOA: 01/12/2014  PCP: [name], MD  Admit date: 01/12/2014 Discharge date: 01/13/2014  Time spent: 35 minutes  Recommendations for Outpatient Follow-up:  1. Please follow up on a BMP on hospital follow up, he was admitted for Acute Kidney Failure likely as a consequence of diuresis  Discharge Diagnoses:  Principal Problem:   AKI (acute kidney injury) Active Problems:   Chronic diastolic CHF (congestive heart failure)   CORONARY ATHEROSCLEROSIS NATIVE CORONARY ARTERY   HTN (hypertension)   Discharge Condition: Stable  Diet recommendation: Heart Healthy  Filed Weights   01/12/14 1318 01/13/14 0500  Weight: 139.073 kg (306 lb 9.6 oz) 138.982 kg (306 lb 6.4 oz)    History of present illness:  [name] is a 73 y.o. male with PMH of CAD, chronic diastolic CHF, DM, HTN presents to the ER with the above complaints.  He was seen in Cardiology Clinic 3 days ago and then his dose of lasix was doubled from 60mg  daily to 60mg  BID due to 15 lb weight gain in 1 year and some leg swelling. He had no dyspnea, orthopnea noted at the time.  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  He urinated a lot on Tuesday night and early Wednesday, subsequently noticed that he urinated only a spoonful yesterday all day.  He called cardiology office and was asked to come to the ER.  In ER, noted to have AKI, given 2L fluid bolus and TRH consulted, Bladder scan with <20cc urine noted   Hospital Course:  Patient is a pleasant 73 year old gentleman with a past medical history of chronic diastolic congestive heart failure having his last transthoracic echocardiogram on 10/04/2012 which showed a preserved ejection fraction of 55-60% with grade 1 diastolic dysfunction. His Lasix dose was recently doubled to 60 mg by mouth twice a day after noticing bilateral extremity edema and weight  gain. He currently works as a Development worker, international aid and had been out working all day on Wednesday and Thursday and 90 temperatures. Patient reporting decreased urine output and was recommended by cardiology to seek medical attention in the emergency room. Initial labs revealing acute kidney injury as his creatinine had gone up from 1.4 on 01/09/2014 to 2.15 on 01/12/2014. Acute kidney injury likely secondary to increased Lasix as well as dehydration from his job. He was placed in overnight observation, administered IV fluids. By the following morning creatinine trended down to 1.3 with BUN of 41. He reported feeling significantly better and felt well enough to go home. He was discharged to his home on 01/13/2014  Procedures:  Bilateral renal ultrasound performed on 01/12/2014 negative for hydronephrosis   Discharge Exam: Filed Vitals:   01/13/14 0500  BP: 148/82  Pulse: 64  Temp: 98.2 F (36.8 C)  Resp: 18    General: Patient is awake and alert, oriented x3 in no acute distress. Pleasant cooperative following commands, states feeling much better Cardiovascular: Regular rate and rhythm normal S1S2 Respiratory: Normal respiratory effort, breathing comfortably on room air, lungs overall clear to auscultation Abdomen: Soft nontender nondistended  Discharge Instructions You were cared for by a hospitalist during your hospital stay. If you have any questions about your discharge medications or the care you received while you were in the hospital after you are discharged, you can call the unit and asked to speak with the hospitalist on call if the hospitalist that took care of  you is not available. Once you are discharged, your primary care physician will handle any further medical issues. Please note that NO REFILLS for any discharge medications will be authorized once you are discharged, as it is imperative that you return to your primary care physician (or establish a relationship with a primary care  physician if you do not have one) for your aftercare needs so that they can reassess your need for medications and monitor your lab values.      Discharge Instructions   (HEART FAILURE PATIENTS) Call MD:  Anytime you have any of the following symptoms: 1) 3 pound weight gain in 24 hours or 5 pounds in 1 week 2) shortness of breath, with or without a dry hacking cough 3) swelling in the hands, feet or stomach 4) if you have to sleep on extra pillows at night in order to breathe.    Complete by:  As directed      Call MD for:  difficulty breathing, headache or visual disturbances    Complete by:  As directed      Call MD for:  extreme fatigue    Complete by:  As directed      Call MD for:  persistant dizziness or light-headedness    Complete by:  As directed      Call MD for:  persistant nausea and vomiting    Complete by:  As directed      Diet - low sodium heart healthy    Complete by:  As directed      Increase activity slowly    Complete by:  As directed             Medication List         aspirin EC 81 MG tablet  Take 81 mg by mouth daily.     atorvastatin 20 MG tablet  Commonly known as:  LIPITOR  Take 20 mg by mouth at bedtime.     doxazosin 2 MG tablet  Commonly known as:  CARDURA  Take 2 mg by mouth daily before breakfast.     furosemide 20 MG tablet  Commonly known as:  LASIX  Take 3 tablets (60 mg total) by mouth 2 (two) times daily.     insulin NPH-regular Human (70-30) 100 UNIT/ML injection  Commonly known as:  NOVOLIN 70/30  Inject 45-65 Units into the skin 2 (two) times daily with a meal. 45 units in am and 65 units in pm     lisinopril 20 MG tablet  Commonly known as:  PRINIVIL,ZESTRIL  Take 20 mg by mouth daily.     metFORMIN 1000 MG tablet  Commonly known as:  GLUCOPHAGE  Take 1,000 mg by mouth daily with breakfast.     metoprolol 100 MG tablet  Commonly known as:  LOPRESSOR  Take 100 mg by mouth 2 (two) times daily. Takes 2 tablets at breakfast  and 1 tablet at night     omeprazole 20 MG capsule  Commonly known as:  PRILOSEC  Take 20 mg by mouth 2 (two) times daily before a meal.     potassium chloride SA 20 MEQ tablet  Commonly known as:  K-DUR,KLOR-CON  Take 1 tablet (20 mEq total) by mouth 2 (two) times daily.     PRESERVISION AREDS PO  Take 1 tablet by mouth 2 (two) times daily.       Allergies  Allergen Reactions  . Codeine Itching  . Penicillins Rash   Follow-up Information  Follow up with [name], MD In 1 week.   Specialty:  Family Medicine   Contact information:   [address] [place] [address] [phone]        The results of significant diagnostics from this hospitalization (including imaging, microbiology, ancillary and laboratory) are listed below for reference.    Significant Diagnostic Studies: Dg Chest 2 View  01/12/2014   CLINICAL DATA:  Suspected fluid retention. Following medication change  EXAM: CHEST  2 VIEW  COMPARISON:  PA and lateral chest x-ray dated October 03, 2012  FINDINGS: The lungs are adequately inflated. There is no focal infiltrate. There is no pleural effusion. The cardiac silhouette remains enlarged. The central pulmonary vascularity is prominent but stable. No cephalization is demonstrated. There is no interstitial or alveolar edema. The mediastinum is normal in width. The bony thorax is normal.  IMPRESSION: Stable cardiomegaly and central vascular prominence. There is no acute pulmonary edema.   Electronically Signed   By: David  Martinique   On: 01/12/2014 12:51   US Renal  01/12/2014   CLINICAL DATA:  Acute kidney injury  EXAM: RENAL/URINARY TRACT ULTRASOUND COMPLETE  COMPARISON:  Prior CT urogram 10/29/2011  FINDINGS: Right Kidney:  Length: 11.7 cm. Echogenicity within normal limits. No hydronephrosis. No solid renal mass. 4 mm echogenic focus in the interpolar kidney consistent with nephrolithiasis.  Left Kidney:  Length: 13.8 cm. Echogenicity within  normal limits. No mass or hydronephrosis visualized.  Bladder:  Appears normal for degree of bladder distention.  IMPRESSION: 1. Negative for hydronephrosis. 2. Nonobstructing 4 mm calculus in the interpolar right kidney.   Electronically Signed   By: Jacqulynn Cadet M.D.   On: 01/12/2014 19:04    Microbiology: No results found for this or any previous visit (from the past 240 hour(s)).   Labs: Basic Metabolic Panel:  Recent Labs Lab 01/09/14 1708 01/12/14 1021 01/13/14 0430  NA 139 137 142  K 4.6 5.4* 4.3  CL 108 102 106  CO2 20 20 21   GLUCOSE 283* 234* 105*  BUN 28* 56* 41*  CREATININE 1.4 2.15* 1.30  CALCIUM 9.1 8.5 8.2*   Liver Function Tests: No results found for this basename: AST, ALT, ALKPHOS, BILITOT, PROT, ALBUMIN,  in the last 168 hours No results found for this basename: LIPASE, AMYLASE,  in the last 168 hours No results found for this basename: AMMONIA,  in the last 168 hours CBC:  Recent Labs Lab 01/12/14 1021 01/13/14 0430  WBC 9.8 9.3  HGB 14.0 12.7*  HCT 40.4 37.8*  MCV 99.5 98.2  PLT 187 178   Cardiac Enzymes:  Recent Labs Lab 01/12/14 1244  CKTOTAL 144   BNP: BNP (last 3 results)  Recent Labs  01/12/14 1244  PROBNP 67.7   CBG:  Recent Labs Lab 01/12/14 1333 01/12/14 1728 01/12/14 2119 01/13/14 0632  GLUCAP 159* 242* 208* 144*       Signed:  Kelvin Cellar  [facility] 01/13/2014, 8:58 AM

## 2014-01-22 ENCOUNTER — Ambulatory Visit (INDEPENDENT_AMBULATORY_CARE_PROVIDER_SITE_OTHER): Payer: Medicare Other | Admitting: Physician Assistant

## 2014-01-22 ENCOUNTER — Encounter: Payer: Self-pay | Admitting: Physician Assistant

## 2014-01-22 VITALS — BP 130/68 | HR 63 | Ht 78.0 in | Wt 311.0 lb

## 2014-01-22 DIAGNOSIS — I10 Essential (primary) hypertension: Secondary | ICD-10-CM

## 2014-01-22 DIAGNOSIS — I509 Heart failure, unspecified: Secondary | ICD-10-CM

## 2014-01-22 DIAGNOSIS — I498 Other specified cardiac arrhythmias: Secondary | ICD-10-CM

## 2014-01-22 DIAGNOSIS — N179 Acute kidney failure, unspecified: Secondary | ICD-10-CM

## 2014-01-22 DIAGNOSIS — I5032 Chronic diastolic (congestive) heart failure: Secondary | ICD-10-CM

## 2014-01-22 DIAGNOSIS — I471 Supraventricular tachycardia: Secondary | ICD-10-CM

## 2014-01-22 DIAGNOSIS — E782 Mixed hyperlipidemia: Secondary | ICD-10-CM

## 2014-01-22 DIAGNOSIS — I2510 Atherosclerotic heart disease of native coronary artery without angina pectoris: Secondary | ICD-10-CM

## 2014-01-22 LAB — BASIC METABOLIC PANEL
BUN: 26 mg/dL — ABNORMAL HIGH (ref 6–23)
CHLORIDE: 110 meq/L (ref 96–112)
CO2: 23 meq/L (ref 19–32)
CREATININE: 1.3 mg/dL (ref 0.4–1.5)
Calcium: 9 mg/dL (ref 8.4–10.5)
GFR: 57.1 mL/min — ABNORMAL LOW (ref >60.00)
GLUCOSE: 153 mg/dL — AB (ref 70–99)
Potassium: 4.4 mEq/L (ref 3.5–5.1)
Sodium: 140 mEq/L (ref 135–145)

## 2014-01-22 NOTE — Patient Instructions (Signed)
Your physician recommends that you return for lab work today for Bmet.   Your physician wants you to follow-up in: 6 months with Dr. Angelena Form. You will receive a reminder letter in the mail two months in advance. If you don't receive a letter, please call our office to schedule the follow-up appointment.

## 2014-01-22 NOTE — Progress Notes (Signed)
Cardiology Office Note    Date:  01/22/2014   ID:  [name], DOB [birth year], MRN [id number]  PCP:  [name], MD  Cardiologist:  Dr. Lauree Chandler      History of Present Illness: [name] is a 73 y.o. male with a history of CAD status post remote PCI with directional atherectomy of the LAD, microvascular angina, diastolic CHF, diabetes, HTN, HL, SVT status post prior ablation by Dr. Lovena Le.  Last seen by Dr. Lauree Chandler 01/09/14. Lasix was increased for volume excess. Close follow up was arranged. Unfortunately, the patient was admitted 7/31-8/1 with acute kidney injury (peak Cr 2.15 >>> 1.3 at d/c) in the setting of diuresis. Patient works outside in the extreme temperatures and became dehydrated. He was treated with IV fluids.  He is doing sell since d/c.  The patient denies chest pain, shortness of breath, syncope, orthopnea, PND or significant pedal edema.   Studies:  - LHC (11/08):  Mid LAD 30%, proximal circumflex 50%, OM2 40%, proximal RCA 40-50%, EF 60%  - Echo (4/14):  Mild LVH, EF 55-60%, normal wall motion, grade 1 diastolic dysfunction, mild LAE  - ETT (9/13) no ischemic ST changes  Recent Labs/Images: 01/12/2014: Pro B Natriuretic peptide (BNP) 67.7  01/13/2014: Creatinine 1.30; Hemoglobin 12.7*; Potassium 4.3   Dg Chest 2 View  01/12/2014     IMPRESSION: Stable cardiomegaly and central vascular prominence. There is no acute pulmonary edema.   Electronically Signed   By: [name]  Rice   On: 01/12/2014 12:51   US Renal  01/12/2014     IMPRESSION: 1. Negative for hydronephrosis. 2. Nonobstructing 4 mm calculus in the interpolar right kidney.   Electronically Signed   By: [name] M.D.   On: 01/12/2014 19:04     Wt Readings from Last 3 Encounters:  01/22/14 311 lb (141.069 kg)  01/13/14 306 lb 6.4 oz (138.982 kg)  01/09/14 312 lb (141.522 kg)     Past Medical History  Diagnosis Date  . Diabetes mellitus   . Hyperlipidemia   .  Hypertension   . S/P ablation of ventricular arrhythmia   . GERD (gastroesophageal reflux disease)   . History of kidney stones   . Coronary artery disease CARDIOLOGIST - DR Lauree Chandler-  LAST VISIT 01-27-2011  IN [facility]  . Right ureteral stone   . SVT (supraventricular tachycardia)     s/p ablation per Dr. Lovena Le 10 -65yrs ago  . OSA on CPAP     cpap setting of 16  . Arthritis   . Bilateral kidney stones     Current Outpatient Prescriptions  Medication Sig Dispense Refill  . aspirin EC 81 MG tablet Take 81 mg by mouth daily.      Marland Kitchen atorvastatin (LIPITOR) 20 MG tablet Take 20 mg by mouth at bedtime.      Marland Kitchen doxazosin (CARDURA) 2 MG tablet Take 2 mg by mouth daily before breakfast.       . furosemide (LASIX) 20 MG tablet Take 60 mg by mouth daily.      . insulin NPH-regular Human (NOVOLIN 70/30) (70-30) 100 UNIT/ML injection Inject 45-65 Units into the skin 2 (two) times daily with a meal. 45 units in am and 65 units in pm      . lisinopril (PRINIVIL,ZESTRIL) 20 MG tablet Take 20 mg by mouth daily.      . metFORMIN (GLUCOPHAGE) 1000 MG tablet Take 1,000 mg by mouth daily with breakfast.       .  metoprolol (LOPRESSOR) 100 MG tablet Take 100 mg by mouth 2 (two) times daily. Takes 2 tablets at breakfast and 1 tablet at night      . Multiple Vitamins-Minerals (PRESERVISION AREDS PO) Take 1 tablet by mouth 2 (two) times daily.      Marland Kitchen omeprazole (PRILOSEC) 20 MG capsule Take 20 mg by mouth 2 (two) times daily before a meal.       . potassium chloride SA (K-DUR,KLOR-CON) 20 MEQ tablet Take 1 tablet (20 mEq total) by mouth 2 (two) times daily.  60 tablet  6   No current facility-administered medications for this visit.     Allergies:   Codeine and Penicillins   Social History:  The patient  reports that he quit smoking about 27 years ago. His smoking use included Cigarettes. He has a 15 pack-year smoking history. He has never used smokeless tobacco. He reports that he drinks alcohol.     Family History:  The patient's family history includes Heart attack in his brother and mother. There is no history of Colon cancer or Stomach cancer.   ROS:  Please see the history of present illness.  He has chronic diarrhea.   All other systems reviewed and negative.   PHYSICAL EXAM: VS:  BP 130/68  Pulse 63  Ht 6\' 6"  (1.981 m)  Wt 311 lb (141.069 kg)  BMI 35.95 kg/m2 Well nourished, well developed, in no acute distress HEENT: normal Neck: no JVD Cardiac:  normal S1, S2; RRR; no murmur Lungs:  clear to auscultation bilaterally, no wheezing, rhonchi or rales Abd: soft, nontender, no hepatomegaly Ext: trace to 1+ bilateral LE edema Skin: warm and dry Neuro:  CNs 2-12 intact, no focal abnormalities noted  EKG:  NSR, HR 63, normal axis, nonspecific ST-T wave changes, no change from prior tracings     ASSESSMENT AND PLAN:  Chronic diastolic CHF (congestive heart failure):  Volume appears stable. He has chronic LE edema that is also likely related to venous insufficiency. We discussed the possibility wearing compression stockings. He already has these at home. We discussed the importance of daily weights and when to take extra Lasix.  AKI (acute kidney injury) - Resolved with hydration in the hospital. He is now back on Lasix.  Plan: Basic metabolic panel  Atherosclerosis of native coronary artery of native heart without angina pectoris:  No angina. Continue aspirin, statin, beta blocker.  Essential hypertension:  Controlled.  Mixed hyperlipidemia:  Continue statin.  SVT (supraventricular tachycardia) :  Quiescent.  Disposition:  F/u with Dr. Lauree Chandler in 6 mos.    Signed, Versie Starks, MHS 01/22/2014 8:49 AM    [facility] [place], [place], [place]  [address] Phone: [phone]; Fax: [phone]

## 2014-01-24 ENCOUNTER — Ambulatory Visit: Payer: Medicare Other | Admitting: Physician Assistant

## 2014-05-01 ENCOUNTER — Ambulatory Visit
Admission: RE | Admit: 2014-05-01 | Discharge: 2014-05-01 | Disposition: A | Discharge status: Home / Self Care | Payer: Medicare Other | Source: Ambulatory Visit | Attending: Family Medicine | Admitting: Family Medicine

## 2014-05-01 ENCOUNTER — Other Ambulatory Visit: Payer: Self-pay | Admitting: Family Medicine

## 2014-05-01 DIAGNOSIS — D72819 Decreased white blood cell count, unspecified: Secondary | ICD-10-CM

## 2014-05-01 DIAGNOSIS — D72829 Elevated white blood cell count, unspecified: Secondary | ICD-10-CM

## 2014-05-01 DIAGNOSIS — R42 Dizziness and giddiness: Secondary | ICD-10-CM

## 2014-05-02 ENCOUNTER — Encounter: Payer: Self-pay | Admitting: Neurology

## 2014-05-08 ENCOUNTER — Encounter: Payer: Self-pay | Admitting: Neurology

## 2014-05-09 ENCOUNTER — Inpatient Hospital Stay (HOSPITAL_COMMUNITY)
Admission: EM | Admit: 2014-05-09 | Discharge: 2014-05-19 | DRG: 823 | Disposition: A | Discharge status: Home / Self Care | Payer: Medicare Other | Attending: Internal Medicine | Admitting: Internal Medicine

## 2014-05-09 ENCOUNTER — Other Ambulatory Visit: Payer: Self-pay

## 2014-05-09 ENCOUNTER — Encounter (HOSPITAL_COMMUNITY): Payer: Self-pay | Admitting: *Deleted

## 2014-05-09 ENCOUNTER — Emergency Department (HOSPITAL_COMMUNITY): Payer: Medicare Other

## 2014-05-09 DIAGNOSIS — I5032 Chronic diastolic (congestive) heart failure: Secondary | ICD-10-CM | POA: Diagnosis present

## 2014-05-09 DIAGNOSIS — Z79899 Other long term (current) drug therapy: Secondary | ICD-10-CM | POA: Diagnosis not present

## 2014-05-09 DIAGNOSIS — Z7982 Long term (current) use of aspirin: Secondary | ICD-10-CM

## 2014-05-09 DIAGNOSIS — D62 Acute posthemorrhagic anemia: Secondary | ICD-10-CM

## 2014-05-09 DIAGNOSIS — E669 Obesity, unspecified: Secondary | ICD-10-CM | POA: Diagnosis present

## 2014-05-09 DIAGNOSIS — K922 Gastrointestinal hemorrhage, unspecified: Secondary | ICD-10-CM | POA: Insufficient documentation

## 2014-05-09 DIAGNOSIS — K219 Gastro-esophageal reflux disease without esophagitis: Secondary | ICD-10-CM | POA: Diagnosis present

## 2014-05-09 DIAGNOSIS — I2510 Atherosclerotic heart disease of native coronary artery without angina pectoris: Secondary | ICD-10-CM | POA: Diagnosis present

## 2014-05-09 DIAGNOSIS — Z9049 Acquired absence of other specified parts of digestive tract: Secondary | ICD-10-CM | POA: Diagnosis present

## 2014-05-09 DIAGNOSIS — K5730 Diverticulosis of large intestine without perforation or abscess without bleeding: Secondary | ICD-10-CM | POA: Diagnosis present

## 2014-05-09 DIAGNOSIS — M7989 Other specified soft tissue disorders: Secondary | ICD-10-CM | POA: Diagnosis present

## 2014-05-09 DIAGNOSIS — E782 Mixed hyperlipidemia: Secondary | ICD-10-CM | POA: Diagnosis present

## 2014-05-09 DIAGNOSIS — R1900 Intra-abdominal and pelvic swelling, mass and lump, unspecified site: Secondary | ICD-10-CM | POA: Insufficient documentation

## 2014-05-09 DIAGNOSIS — Z9889 Other specified postprocedural states: Secondary | ICD-10-CM | POA: Diagnosis not present

## 2014-05-09 DIAGNOSIS — A047 Enterocolitis due to Clostridium difficile: Secondary | ICD-10-CM | POA: Diagnosis present

## 2014-05-09 DIAGNOSIS — C772 Secondary and unspecified malignant neoplasm of intra-abdominal lymph nodes: Secondary | ICD-10-CM | POA: Diagnosis present

## 2014-05-09 DIAGNOSIS — R42 Dizziness and giddiness: Secondary | ICD-10-CM | POA: Diagnosis not present

## 2014-05-09 DIAGNOSIS — K8689 Other specified diseases of pancreas: Secondary | ICD-10-CM

## 2014-05-09 DIAGNOSIS — G4733 Obstructive sleep apnea (adult) (pediatric): Secondary | ICD-10-CM | POA: Diagnosis present

## 2014-05-09 DIAGNOSIS — Z9861 Coronary angioplasty status: Secondary | ICD-10-CM

## 2014-05-09 DIAGNOSIS — Z87442 Personal history of urinary calculi: Secondary | ICD-10-CM

## 2014-05-09 DIAGNOSIS — I951 Orthostatic hypotension: Secondary | ICD-10-CM | POA: Diagnosis present

## 2014-05-09 DIAGNOSIS — D72829 Elevated white blood cell count, unspecified: Secondary | ICD-10-CM | POA: Diagnosis present

## 2014-05-09 DIAGNOSIS — Z87891 Personal history of nicotine dependence: Secondary | ICD-10-CM | POA: Diagnosis not present

## 2014-05-09 DIAGNOSIS — R531 Weakness: Secondary | ICD-10-CM | POA: Insufficient documentation

## 2014-05-09 DIAGNOSIS — R002 Palpitations: Secondary | ICD-10-CM | POA: Diagnosis present

## 2014-05-09 DIAGNOSIS — E8809 Other disorders of plasma-protein metabolism, not elsewhere classified: Secondary | ICD-10-CM | POA: Diagnosis not present

## 2014-05-09 DIAGNOSIS — E162 Hypoglycemia, unspecified: Secondary | ICD-10-CM

## 2014-05-09 DIAGNOSIS — N183 Chronic kidney disease, stage 3 (moderate): Secondary | ICD-10-CM | POA: Diagnosis present

## 2014-05-09 DIAGNOSIS — Z808 Family history of malignant neoplasm of other organs or systems: Secondary | ICD-10-CM | POA: Diagnosis not present

## 2014-05-09 DIAGNOSIS — C801 Malignant (primary) neoplasm, unspecified: Secondary | ICD-10-CM | POA: Diagnosis present

## 2014-05-09 DIAGNOSIS — Z794 Long term (current) use of insulin: Secondary | ICD-10-CM

## 2014-05-09 DIAGNOSIS — I129 Hypertensive chronic kidney disease with stage 1 through stage 4 chronic kidney disease, or unspecified chronic kidney disease: Secondary | ICD-10-CM | POA: Diagnosis present

## 2014-05-09 DIAGNOSIS — I5033 Acute on chronic diastolic (congestive) heart failure: Secondary | ICD-10-CM

## 2014-05-09 DIAGNOSIS — Z885 Allergy status to narcotic agent status: Secondary | ICD-10-CM

## 2014-05-09 DIAGNOSIS — N179 Acute kidney failure, unspecified: Secondary | ICD-10-CM | POA: Diagnosis present

## 2014-05-09 DIAGNOSIS — M1990 Unspecified osteoarthritis, unspecified site: Secondary | ICD-10-CM | POA: Diagnosis present

## 2014-05-09 DIAGNOSIS — E11649 Type 2 diabetes mellitus with hypoglycemia without coma: Secondary | ICD-10-CM | POA: Diagnosis present

## 2014-05-09 DIAGNOSIS — R591 Generalized enlarged lymph nodes: Secondary | ICD-10-CM | POA: Diagnosis present

## 2014-05-09 DIAGNOSIS — R1909 Other intra-abdominal and pelvic swelling, mass and lump: Secondary | ICD-10-CM | POA: Diagnosis present

## 2014-05-09 DIAGNOSIS — A0472 Enterocolitis due to Clostridium difficile, not specified as recurrent: Secondary | ICD-10-CM | POA: Insufficient documentation

## 2014-05-09 DIAGNOSIS — K868 Other specified diseases of pancreas: Secondary | ICD-10-CM | POA: Diagnosis present

## 2014-05-09 DIAGNOSIS — I10 Essential (primary) hypertension: Secondary | ICD-10-CM | POA: Diagnosis present

## 2014-05-09 DIAGNOSIS — Z8249 Family history of ischemic heart disease and other diseases of the circulatory system: Secondary | ICD-10-CM | POA: Diagnosis not present

## 2014-05-09 DIAGNOSIS — R0789 Other chest pain: Secondary | ICD-10-CM

## 2014-05-09 DIAGNOSIS — Z881 Allergy status to other antibiotic agents status: Secondary | ICD-10-CM | POA: Diagnosis not present

## 2014-05-09 DIAGNOSIS — R509 Fever, unspecified: Secondary | ICD-10-CM

## 2014-05-09 DIAGNOSIS — Z96651 Presence of right artificial knee joint: Secondary | ICD-10-CM | POA: Diagnosis present

## 2014-05-09 DIAGNOSIS — K921 Melena: Secondary | ICD-10-CM | POA: Diagnosis present

## 2014-05-09 DIAGNOSIS — R6511 Systemic inflammatory response syndrome (SIRS) of non-infectious origin with acute organ dysfunction: Secondary | ICD-10-CM | POA: Diagnosis present

## 2014-05-09 DIAGNOSIS — I471 Supraventricular tachycardia: Secondary | ICD-10-CM | POA: Diagnosis present

## 2014-05-09 DIAGNOSIS — Z23 Encounter for immunization: Secondary | ICD-10-CM | POA: Diagnosis not present

## 2014-05-09 DIAGNOSIS — R935 Abnormal findings on diagnostic imaging of other abdominal regions, including retroperitoneum: Secondary | ICD-10-CM

## 2014-05-09 DIAGNOSIS — R079 Chest pain, unspecified: Secondary | ICD-10-CM | POA: Diagnosis present

## 2014-05-09 DIAGNOSIS — Z6835 Body mass index (BMI) 35.0-35.9, adult: Secondary | ICD-10-CM | POA: Diagnosis not present

## 2014-05-09 DIAGNOSIS — R6510 Systemic inflammatory response syndrome (SIRS) of non-infectious origin without acute organ dysfunction: Secondary | ICD-10-CM

## 2014-05-09 DIAGNOSIS — Z880 Allergy status to penicillin: Secondary | ICD-10-CM

## 2014-05-09 HISTORY — DX: Chronic kidney disease, stage 3 (moderate): N18.3

## 2014-05-09 HISTORY — DX: Chronic kidney disease, stage 3 unspecified: N18.30

## 2014-05-09 HISTORY — DX: Reserved for inherently not codable concepts without codable children: IMO0001

## 2014-05-09 LAB — I-STAT CG4 LACTIC ACID, ED
LACTIC ACID, VENOUS: 1.35 mmol/L (ref 0.5–2.2)
Lactic Acid, Venous: 2.17 mmol/L (ref 0.5–2.2)

## 2014-05-09 LAB — CBC WITH DIFFERENTIAL/PLATELET
BASOS ABS: 0 10*3/uL (ref 0.0–0.1)
BASOS ABS: 0 10*3/uL (ref 0.0–0.1)
BASOS PCT: 0 % (ref 0–1)
Basophils Relative: 0 % (ref 0–1)
EOS ABS: 0 10*3/uL (ref 0.0–0.7)
EOS PCT: 0 % (ref 0–5)
EOS PCT: 0 % (ref 0–5)
Eosinophils Absolute: 0 10*3/uL (ref 0.0–0.7)
HCT: 35.6 % — ABNORMAL LOW (ref 39.0–52.0)
HEMATOCRIT: 31.1 % — AB (ref 39.0–52.0)
Hemoglobin: 12.1 g/dL — ABNORMAL LOW (ref 13.0–17.0)
Hemoglobin: 10.5 g/dL — ABNORMAL LOW (ref 13.0–17.0)
Lymphocytes Relative: 7 % — ABNORMAL LOW (ref 12–46)
Lymphocytes Relative: 9 % — ABNORMAL LOW (ref 12–46)
Lymphs Abs: 1.9 10*3/uL (ref 0.7–4.0)
Lymphs Abs: 1.5 10*3/uL (ref 0.7–4.0)
MCH: 32.7 pg (ref 26.0–34.0)
MCH: 32 pg (ref 26.0–34.0)
MCHC: 34 g/dL (ref 30.0–36.0)
MCHC: 33.8 g/dL (ref 30.0–36.0)
MCV: 96.2 fL (ref 78.0–100.0)
MCV: 94.8 fL (ref 78.0–100.0)
MONO ABS: 1.6 10*3/uL — AB (ref 0.1–1.0)
MONOS PCT: 9 % (ref 3–12)
Monocytes Absolute: 3.5 10*3/uL — ABNORMAL HIGH (ref 0.1–1.0)
Monocytes Relative: 13 % — ABNORMAL HIGH (ref 3–12)
NEUTROS PCT: 80 % — AB (ref 43–77)
Neutro Abs: 21.5 10*3/uL — ABNORMAL HIGH (ref 1.7–7.7)
Neutro Abs: 14.6 10*3/uL — ABNORMAL HIGH (ref 1.7–7.7)
Neutrophils Relative %: 82 % — ABNORMAL HIGH (ref 43–77)
Platelets: 337 10*3/uL (ref 150–400)
Platelets: 254 10*3/uL (ref 150–400)
RBC: 3.7 MIL/uL — AB (ref 4.22–5.81)
RBC: 3.28 MIL/uL — ABNORMAL LOW (ref 4.22–5.81)
RDW: 12.7 % (ref 11.5–15.5)
RDW: 12.7 % (ref 11.5–15.5)
WBC: 26.9 10*3/uL — ABNORMAL HIGH (ref 4.0–10.5)
WBC: 17.7 10*3/uL — ABNORMAL HIGH (ref 4.0–10.5)

## 2014-05-09 LAB — URINALYSIS, ROUTINE W REFLEX MICROSCOPIC
Bilirubin Urine: NEGATIVE
Glucose, UA: NEGATIVE mg/dL
Hgb urine dipstick: NEGATIVE
Ketones, ur: NEGATIVE mg/dL
LEUKOCYTES UA: NEGATIVE
NITRITE: NEGATIVE
Protein, ur: NEGATIVE mg/dL
SPECIFIC GRAVITY, URINE: 1.018 (ref 1.005–1.030)
UROBILINOGEN UA: 1 mg/dL (ref 0.0–1.0)
pH: 5 (ref 5.0–8.0)

## 2014-05-09 LAB — CREATININE, SERUM
Creatinine, Ser: 1.22 mg/dL (ref 0.50–1.35)
GFR calc Af Amer: 67 mL/min — ABNORMAL LOW (ref >90)
GFR calc non Af Amer: 57 mL/min — ABNORMAL LOW (ref >90)

## 2014-05-09 LAB — TROPONIN I: Troponin I: <0.3 ng/mL (ref <0.30)

## 2014-05-09 LAB — CBC
HEMATOCRIT: 32 % — AB (ref 39.0–52.0)
HEMOGLOBIN: 10.9 g/dL — AB (ref 13.0–17.0)
MCH: 32.3 pg (ref 26.0–34.0)
MCHC: 34.1 g/dL (ref 30.0–36.0)
MCV: 95 fL (ref 78.0–100.0)
Platelets: 250 10*3/uL (ref 150–400)
RBC: 3.37 MIL/uL — ABNORMAL LOW (ref 4.22–5.81)
RDW: 12.8 % (ref 11.5–15.5)
WBC: 21.4 10*3/uL — AB (ref 4.0–10.5)

## 2014-05-09 LAB — CBG MONITORING, ED
GLUCOSE-CAPILLARY: 45 mg/dL — AB (ref 70–99)
GLUCOSE-CAPILLARY: 130 mg/dL — AB (ref 70–99)
Glucose-Capillary: 95 mg/dL (ref 70–99)

## 2014-05-09 LAB — POCT I-STAT TROPONIN I: TROPONIN I, POC: 0.01 ng/mL (ref 0.00–0.08)

## 2014-05-09 LAB — COMPREHENSIVE METABOLIC PANEL
ALBUMIN: 2.7 g/dL — AB (ref 3.5–5.2)
ALBUMIN: 2 g/dL — AB (ref 3.5–5.2)
ALT: 15 U/L (ref 0–53)
ALT: 12 U/L (ref 0–53)
ANION GAP: 14 (ref 5–15)
AST: 18 U/L (ref 0–37)
AST: 12 U/L (ref 0–37)
Alkaline Phosphatase: 63 U/L (ref 39–117)
Alkaline Phosphatase: 59 U/L (ref 39–117)
Anion gap: 18 — ABNORMAL HIGH (ref 5–15)
BUN: 33 mg/dL — ABNORMAL HIGH (ref 6–23)
BUN: 30 mg/dL — AB (ref 6–23)
CALCIUM: 9.2 mg/dL (ref 8.4–10.5)
CALCIUM: 8 mg/dL — AB (ref 8.4–10.5)
CO2: 18 mEq/L — ABNORMAL LOW (ref 19–32)
CO2: 16 mEq/L — ABNORMAL LOW (ref 19–32)
CREATININE: 1.27 mg/dL (ref 0.50–1.35)
Chloride: 99 mEq/L (ref 96–112)
Chloride: 105 mEq/L (ref 96–112)
Creatinine, Ser: 1.44 mg/dL — ABNORMAL HIGH (ref 0.50–1.35)
GFR calc Af Amer: 55 mL/min — ABNORMAL LOW (ref >90)
GFR calc Af Amer: 63 mL/min — ABNORMAL LOW (ref >90)
GFR calc non Af Amer: 47 mL/min — ABNORMAL LOW (ref >90)
GFR calc non Af Amer: 55 mL/min — ABNORMAL LOW (ref >90)
Glucose, Bld: 38 mg/dL — CL (ref 70–99)
Glucose, Bld: 274 mg/dL — ABNORMAL HIGH (ref 70–99)
Potassium: 4 mEq/L (ref 3.7–5.3)
Potassium: 4.6 mEq/L (ref 3.7–5.3)
SODIUM: 135 meq/L — AB (ref 137–147)
Sodium: 135 mEq/L — ABNORMAL LOW (ref 137–147)
TOTAL PROTEIN: 6.9 g/dL (ref 6.0–8.3)
Total Bilirubin: 0.7 mg/dL (ref 0.3–1.2)
Total Bilirubin: 0.5 mg/dL (ref 0.3–1.2)
Total Protein: 5.6 g/dL — ABNORMAL LOW (ref 6.0–8.3)

## 2014-05-09 LAB — GLUCOSE, CAPILLARY
GLUCOSE-CAPILLARY: 238 mg/dL — AB (ref 70–99)
Glucose-Capillary: 194 mg/dL — ABNORMAL HIGH (ref 70–99)
Glucose-Capillary: 303 mg/dL — ABNORMAL HIGH (ref 70–99)

## 2014-05-09 LAB — SAVE SMEAR

## 2014-05-09 LAB — I-STAT TROPONIN, ED: Troponin i, poc: 0 ng/mL (ref 0.00–0.08)

## 2014-05-09 MED ORDER — GABAPENTIN 100 MG PO CAPS
200.0000 mg | ORAL_CAPSULE | Freq: Every day | ORAL | Status: DC
  Administered 2014-05-09 – 2014-05-13 (×5): 200 mg via ORAL
  Filled 2014-05-09 (×6): qty 2

## 2014-05-09 MED ORDER — ASPIRIN 325 MG PO TABS
325.0000 mg | ORAL_TABLET | Freq: Once | ORAL | Status: AC
  Administered 2014-05-09: 325 mg via ORAL
  Filled 2014-05-09 (×2): qty 1

## 2014-05-09 MED ORDER — PANTOPRAZOLE SODIUM 40 MG PO TBEC
40.0000 mg | DELAYED_RELEASE_TABLET | Freq: Every day | ORAL | Status: DC
  Administered 2014-05-09 – 2014-05-10 (×2): 40 mg via ORAL
  Filled 2014-05-09: qty 1

## 2014-05-09 MED ORDER — ONDANSETRON HCL 4 MG PO TABS: 4.0000 mg | ORAL_TABLET | Freq: Four times a day (QID) | ORAL | Status: DC | PRN

## 2014-05-09 MED ORDER — ONDANSETRON HCL 4 MG/2ML IJ SOLN: 4.0000 mg | Freq: Four times a day (QID) | INTRAMUSCULAR | Status: DC | PRN

## 2014-05-09 MED ORDER — ASPIRIN EC 81 MG PO TBEC
81.0000 mg | DELAYED_RELEASE_TABLET | Freq: Every day | ORAL | Status: DC
  Administered 2014-05-10: 81 mg via ORAL
  Filled 2014-05-09: qty 1

## 2014-05-09 MED ORDER — ACETAMINOPHEN 650 MG RE SUPP: 650.0000 mg | Freq: Four times a day (QID) | RECTAL | Status: DC | PRN

## 2014-05-09 MED ORDER — INFLUENZA VAC SPLIT QUAD 0.5 ML IM SUSY
0.5000 mL | PREFILLED_SYRINGE | INTRAMUSCULAR | Status: AC
  Administered 2014-05-10: 0.5 mL via INTRAMUSCULAR
  Filled 2014-05-09: qty 0.5

## 2014-05-09 MED ORDER — SODIUM CHLORIDE 0.9 % IJ SOLN
3.0000 mL | Freq: Two times a day (BID) | INTRAMUSCULAR | Status: DC
  Administered 2014-05-09 – 2014-05-13 (×6): 3 mL via INTRAVENOUS

## 2014-05-09 MED ORDER — ALBUTEROL SULFATE (2.5 MG/3ML) 0.083% IN NEBU
2.5000 mg | INHALATION_SOLUTION | RESPIRATORY_TRACT | Status: DC | PRN
  Administered 2014-05-10: 2.5 mg via RESPIRATORY_TRACT
  Filled 2014-05-09: qty 3

## 2014-05-09 MED ORDER — INSULIN ASPART 100 UNIT/ML ~~LOC~~ SOLN
0.0000 [IU] | Freq: Three times a day (TID) | SUBCUTANEOUS | Status: DC
  Administered 2014-05-09 – 2014-05-10 (×2): 11 [IU] via SUBCUTANEOUS
  Administered 2014-05-10: 8 [IU] via SUBCUTANEOUS

## 2014-05-09 MED ORDER — ASPIRIN EC 81 MG PO TBEC
81.0000 mg | DELAYED_RELEASE_TABLET | Freq: Every day | ORAL | Status: DC
  Filled 2014-05-09: qty 1

## 2014-05-09 MED ORDER — ALUM & MAG HYDROXIDE-SIMETH 200-200-20 MG/5ML PO SUSP: 30.0000 mL | Freq: Four times a day (QID) | ORAL | Status: DC | PRN

## 2014-05-09 MED ORDER — ENOXAPARIN SODIUM 40 MG/0.4ML ~~LOC~~ SOLN: 40.0000 mg | SUBCUTANEOUS | Status: DC

## 2014-05-09 MED ORDER — ATORVASTATIN CALCIUM 20 MG PO TABS
20.0000 mg | ORAL_TABLET | Freq: Every day | ORAL | Status: DC
  Administered 2014-05-09 – 2014-05-13 (×5): 20 mg via ORAL
  Filled 2014-05-09 (×6): qty 1

## 2014-05-09 MED ORDER — SODIUM CHLORIDE 0.9 % IV SOLN
INTRAVENOUS | Status: AC
  Administered 2014-05-09: 20:00:00 via INTRAVENOUS

## 2014-05-09 MED ORDER — CYCLOBENZAPRINE HCL 5 MG PO TABS
5.0000 mg | ORAL_TABLET | Freq: Once | ORAL | Status: AC
  Administered 2014-05-09: 5 mg via ORAL
  Filled 2014-05-09: qty 1

## 2014-05-09 MED ORDER — DEXTROSE 50 % IV SOLN
50.0000 mL | Freq: Once | INTRAVENOUS | Status: AC
  Administered 2014-05-09: 50 mL via INTRAVENOUS
  Filled 2014-05-09: qty 50

## 2014-05-09 MED ORDER — GLUCERNA SHAKE PO LIQD: 237.0000 mL | Freq: Two times a day (BID) | ORAL | Status: DC | PRN

## 2014-05-09 MED ORDER — CETYLPYRIDINIUM CHLORIDE 0.05 % MT LIQD
7.0000 mL | Freq: Two times a day (BID) | OROMUCOSAL | Status: DC
  Administered 2014-05-10 – 2014-05-13 (×7): 7 mL via OROMUCOSAL

## 2014-05-09 MED ORDER — SODIUM CHLORIDE 0.9 % IV BOLUS (SEPSIS)
1000.0000 mL | INTRAVENOUS | Status: AC
  Administered 2014-05-09 (×4): 1000 mL via INTRAVENOUS

## 2014-05-09 MED ORDER — SODIUM CHLORIDE 0.9 % IV BOLUS (SEPSIS)
500.0000 mL | Freq: Once | INTRAVENOUS | Status: AC
  Administered 2014-05-09: 500 mL via INTRAVENOUS

## 2014-05-09 MED ORDER — ACETAMINOPHEN 325 MG PO TABS
650.0000 mg | ORAL_TABLET | Freq: Four times a day (QID) | ORAL | Status: DC | PRN
  Administered 2014-05-09 – 2014-05-13 (×9): 650 mg via ORAL
  Filled 2014-05-09 (×10): qty 2

## 2014-05-09 MED ORDER — ENOXAPARIN SODIUM 80 MG/0.8ML ~~LOC~~ SOLN
70.0000 mg | SUBCUTANEOUS | Status: DC
  Administered 2014-05-09 – 2014-05-10 (×2): 70 mg via SUBCUTANEOUS
  Filled 2014-05-09 (×3): qty 0.8

## 2014-05-09 MED ORDER — SODIUM CHLORIDE 0.9 % IV BOLUS (SEPSIS)
1000.0000 mL | Freq: Once | INTRAVENOUS | Status: AC
  Administered 2014-05-09: 1000 mL via INTRAVENOUS

## 2014-05-09 MED ORDER — GUAIFENESIN-DM 100-10 MG/5ML PO SYRP: 5.0000 mL | ORAL_SOLUTION | ORAL | Status: DC | PRN

## 2014-05-09 NOTE — Progress Notes (Signed)
Pt has CPAP from home on in room. RT notified. Tylene Fantasia paged for in hospital order.

## 2014-05-09 NOTE — Progress Notes (Signed)
INITIAL NUTRITION ASSESSMENT  DOCUMENTATION CODES Per approved criteria  -Obesity Unspecified   INTERVENTION: Provide Glucerna Shakes BID PRN, each supplement provides 220 kcal and 10 grams of protein Provided and discussed "Carbohydrate Counting for People with Diabetes"  NUTRITION DIAGNOSIS: Inadequate oral intake related to decreased appetite and diet restrictions as evidenced by reported 22 lb weight loss in past month.   Goal: Pt to meet >/= 90% of their estimated nutrition needs  Monitor:  PO intake, weight trend, labs  Reason for Assessment: Malnutrition Screening Tool, score of 3  73 y.o. male  Admitting Dx: Palpitations  ASSESSMENT: 73 y.o. male with a Past Medical History of coronary artery disease, chronic diastolic heart failure, diabetes, hypertension, dyslipidemia who presents today with complaints of palpitations/chest pain/shortness of breath this morning.   Per weight history below, pt weighs 15 lbs less than he did 4 months ago but, the same weight as 18 months ago. Patient consumed about 75% of his lunch today. He reports that his appetite is better today than it has been. Patient reports that he has lost 22 lbs in the past month due to both a decreased appetite and trying to eat fewer carbs to control his blood sugar. Pt states that he doesn't know what to eat anymore to control his blood sugar. Patient requested diabetic diet education.   Lab Results  Component Value Date   HGBA1C 6.8* 07/18/2012    RD provided "Carbohydrate Counting for People with Diabetes" handout from the Academy of Nutrition and Dietetics as well as "1800-calorie 5-day Sample Menus". Discussed different food groups and their effects on blood sugar, emphasizing carbohydrate-containing foods. Provided list of carbohydrates and recommended serving sizes of common foods.  Discussed importance of controlled and consistent carbohydrate intake throughout the day. Provided examples of ways to  balance meals/snacks and encouraged intake of high-fiber, whole grain complex carbohydrates. Teach back method used.  Expect good compliance.   Labs: glucose ranging 38 to 303 mg/dL, low hemoglobin, low sodium, low albumin, elevated BUN  Height: Ht Readings from Last 1 Encounters:  05/09/14 6\' 5"  (1.956 m)    Weight: Wt Readings from Last 1 Encounters:  05/09/14 296 lb 4.8 oz (134.4 kg)    Ideal Body Weight: 208 lbs  % Ideal Body Weight: 142%  Wt Readings from Last 10 Encounters:  05/09/14 296 lb 4.8 oz (134.4 kg)  01/22/14 311 lb (141.069 kg)  01/13/14 306 lb 6.4 oz (138.982 kg)  01/09/14 312 lb (141.522 kg)  06/27/13 307 lb (139.254 kg)  11/28/12 296 lb (134.265 kg)  10/13/12 292 lb (132.45 kg)  10/05/12 288 lb 12.8 oz (130.999 kg)  07/18/12 292 lb (132.45 kg)  07/08/12 292 lb 3.2 oz (132.541 kg)    Usual Body Weight: 318 lbs  % Usual Body Weight: 93%  BMI:  Body mass index is 35.13 kg/(m^2).  Estimated Nutritional Needs: Kcal: 6962-9528 Protein: 115-130 grams Fluid: 3.2 L/day  Skin: intact  Diet Order: Diet heart healthy/carb modified  EDUCATION NEEDS: -Education needs addressed   Intake/Output Summary (Last 24 hours) at 05/09/14 1622 Last data filed at 05/09/14 1338  Gross per 24 hour  Intake   3120 ml  Output    250 ml  Net   2870 ml    Last BM: PTA  Labs:   Recent Labs Lab 05/09/14 0755 05/09/14 1300  NA 135*  --   K 4.0  --   CL 99  --   CO2 18*  --  BUN 33*  --   CREATININE 1.44* 1.22  CALCIUM 9.2  --   GLUCOSE 38*  --     CBG (last 3)   Recent Labs  05/09/14 1050 05/09/14 1231 05/09/14 1553  GLUCAP 95 194* 303*    Scheduled Meds: . [START ON 05/10/2014] aspirin EC  81 mg Oral Daily  . atorvastatin  20 mg Oral QHS  . enoxaparin (LOVENOX) injection  70 mg Subcutaneous Q24H  . [START ON 05/10/2014] Influenza vac split quadrivalent PF  0.5 mL Intramuscular Tomorrow-1000  . insulin aspart  0-15 Units Subcutaneous TID  WC  . pantoprazole  40 mg Oral Daily  . sodium chloride  3 mL Intravenous Q12H    Continuous Infusions: . sodium chloride      Past Medical History  Diagnosis Date  . Diabetes mellitus   . Hyperlipidemia   . Hypertension   . GERD (gastroesophageal reflux disease)   . Coronary artery disease     a. S/P prior PTCA in the 90's;  b. 04/2007 Cath: LM nl, LAD Ca2+ prox, LCX nl, RI nl, OM nl, RCA 40-50p, 40d, EF 60%;  b. 09/2012 Echo: EF 55-60%, mild LVH, nl wall motion, Gr 1 DD, mildly dil LA.  . Right ureteral stone   . SVT (supraventricular tachycardia)     a. s/p ablation per Dr. Lovena Le 10 -39yrs ago  . OSA on CPAP     a. cpap setting of 16  . Arthritis   . Bilateral kidney stones   . Shortness of breath dyspnea   . CKD (chronic kidney disease), stage III     Past Surgical History  Procedure Laterality Date  . Right ureteroscopic stone extraction  09-26-2010  . Circumcision and fulgeration of condyloma  06-22-2003  . Cardiac electrophysiology study and ablation  2002  . Transthoracic echocardiogram  03-03-2011    MODERATE LVH/ LVSF NORMAL / EF 60-65%/ MILDLY DILATED LEFT ATRIUMK  . Severeal ureteroscopic stone extractions    . Ureteroscopy  11/13/2011    Procedure: URETEROSCOPY;  Surgeon: Claybon Jabs, MD;  Location: [facility];  Service: Urology;  Laterality: Right;  RIGHT URETEROSCOPY WITH HOLMIUM LASER LIHTOTRIPSY DIGITAL URETEROSCOPE  . Coronary angioplasty  1994    [place] OF THE LAD  . Cardiac catheterization  04-27-2007    CAD WITH 30% NARROWING IN THE MID-LAD/ 50 % NARROWING PROXIMAL CIRCUMFLEX/ 40% NARROWING SECOND MARGINAL BRANCH/ 40-50% PROXIMAL RCA WITH DISTAL POSTERIOR NARROWING/ NORMAL LVF  . Cardiac catheterization  2000    NON-OBSTRUCTIVE CAD  . Laparoscopic cholecystectomy  03-26-2005  . Total knee arthroplasty  07/18/2012    Procedure: TOTAL KNEE ARTHROPLASTY;  Surgeon: Johnn Hai, MD;  Location: [facility];  Service: Orthopedics;  Laterality:  Right;    Pryor Ochoa RD, LDN Inpatient Clinical Dietitian Pager: [phone] After Hours Pager: [phone]

## 2014-05-09 NOTE — ED Notes (Signed)
Pt reports waking up this am with mid chest pain and feeling dizzy/lightheaded since yesterday. Reports starting lisinopril yesterday. ekg done at triage.

## 2014-05-09 NOTE — ED Notes (Signed)
Pt states he began having dull chest pain last night before going to bed. Pt woke up this am with dizziness and chest pain, states he cannot walk without almost falling because he is so dizzy and his vision is blurred. Pt is alert/oriented x 4, able to follow commands. Not having chest pain at this time, however is dizzy.

## 2014-05-09 NOTE — ED Notes (Signed)
CBG up to 130

## 2014-05-09 NOTE — ED Notes (Signed)
Tech at bedside to do orthostatic vitals at this time.

## 2014-05-09 NOTE — ED Notes (Signed)
CRITICAL VALUE ALERT  Critical value received:  CBG 38  Date of notification:05/09/2014  Time of notification:  0913  Critical value read back:Yes.    Nurse who received alert:  Leodis Rains  MD notified (1st page):  Dr. Stevie Kern  Time of first page:  [phone]

## 2014-05-09 NOTE — Progress Notes (Signed)
1106 report received from [facility]

## 2014-05-09 NOTE — Plan of Care (Signed)
Problem: Phase I Progression Outcomes Goal: Pain controlled with appropriate interventions Outcome: Completed/Met Date Met:  05/09/14 Goal: OOB as tolerated unless otherwise ordered Outcome: Completed/Met Date Met:  05/09/14 Pt OOB with 1 assist. Pt states he is no longer dizzy when getting up Goal: Initial discharge plan identified Outcome: Completed/Met Date Met:  05/09/14 Goal: Voiding-avoid urinary catheter unless indicated Outcome: Completed/Met Date Met:  05/09/14 Goal: Hemodynamically stable Outcome: Completed/Met Date Met:  05/09/14 Goal: Other Phase I Outcomes/Goals Outcome: Completed/Met Date Met:  05/09/14

## 2014-05-09 NOTE — Progress Notes (Signed)
Pt with temperature 103 this evening. Tylenol given by day shift RN. Tylene Fantasia paged. New order to increase fluid rate, give 500cc bolus. Check flu PCR. Patient updated on plan of care.

## 2014-05-09 NOTE — Evaluation (Signed)
Physical Therapy Evaluation Patient Details Name: [name] MRN: [id number] DOB: 05-Jun-1941 Today's Date: 05/09/2014   History of Present Illness  73 y.o. male admitted with Palpitations/chest pain/shortness of breath, and orthostatic hypotension.  Clinical Impression  Pt admitted with the above. Pt currently with functional limitations due to the deficits listed below (see PT Problem List). Orthostatic vitals taken with no complaints of dizziness/lightheadedness (see below). Pt reported lightheadedness while ambulating, no complaints of chest discomfort or palpitations, and began to have difficulty with balance. Discussed safety with mobility, awareness of symptoms, and agreed to use an assistive device for stability. Pt will benefit from skilled PT to increase their independence and safety with mobility to allow discharge to the venue listed below.       Follow Up Recommendations No PT follow up    Equipment Recommendations  None recommended by PT    Recommendations for Other Services       Precautions / Restrictions Precautions Precautions: Fall Restrictions Weight Bearing Restrictions: No      Mobility  Bed Mobility Overal bed mobility: Modified Independent                Transfers Overall transfer level: Needs assistance Equipment used: None Transfers: Sit to/from Stand Sit to Stand: Supervision         General transfer comment: Supervision for safety. Mild sway noted upon standing but able to self correct. Denies dizziness or light headedness.  Ambulation/Gait Ambulation/Gait assistance: Min guard Ambulation Distance (Feet): 100 Feet Assistive device: None Gait Pattern/deviations: Step-through pattern;Staggering left;Decreased stride length;Wide base of support Gait velocity: slow   General Gait Details: Moderately slow gait speed, pt became more unbalanced the further he ambulated. Min guard for safety. Did not require physical assist to correct.  Stated he felt mildly lightheaded. Cues for balance control with slower gait speed and safety by stopping when pt feels symptomatic.  Stairs            Wheelchair Mobility    Modified Rankin (Stroke Patients Only)       Balance Overall balance assessment: Needs assistance Sitting-balance support: No upper extremity supported;Feet supported Sitting balance-Leahy Scale: Good     Standing balance support: No upper extremity supported Standing balance-Leahy Scale: Fair                               Pertinent Vitals/Pain Pain Assessment: No/denies pain  Sitting BP 121/60 HR 97 SpO2 99% on room air Standing BP 113/49 HR 102 After ambulating BP 121/65 HR 105    Home Living Family/patient expects to be discharged to:: Private residence Living Arrangements: Spouse/significant other Available Help at Discharge: Family;Available PRN/intermittently Type of Home: House Home Access: Level entry     Home Layout: One level Home Equipment: Walker - 2 wheels;Cane - single point      Prior Function Level of Independence: Independent               Hand Dominance        Extremity/Trunk Assessment   Upper Extremity Assessment: Defer to OT evaluation           Lower Extremity Assessment: Overall WFL for tasks assessed         Communication   Communication: No difficulties  Cognition Arousal/Alertness: Awake/alert Behavior During Therapy: WFL for tasks assessed/performed Overall Cognitive Status: Within Functional Limits for tasks assessed  General Comments      Exercises        Assessment/Plan    PT Assessment Patient needs continued PT services  PT Diagnosis Abnormality of gait   PT Problem List Decreased activity tolerance;Decreased balance;Decreased mobility;Decreased knowledge of use of DME;Cardiopulmonary status limiting activity  PT Treatment Interventions DME instruction;Gait training;Functional  mobility training;Therapeutic activities;Balance training;Therapeutic exercise;Neuromuscular re-education;Patient/family education   PT Goals (Current goals can be found in the Care Plan section) Acute Rehab PT Goals Patient Stated Goal: Not fall PT Goal Formulation: With patient Time For Goal Achievement: 05/23/14 Potential to Achieve Goals: Good    Frequency Min 3X/week   Barriers to discharge        Co-evaluation               End of Session Equipment Utilized During Treatment: Gait belt Activity Tolerance: Patient tolerated treatment well Patient left: in bed;with call bell/phone within reach;with family/visitor present Nurse Communication: Mobility status         Time: 1950-9326 PT Time Calculation (min) (ACUTE ONLY): 25 min   Charges:   PT Evaluation $Initial PT Evaluation Tier I: 1 Procedure PT Treatments $Gait Training: 8-22 mins   PT G CodesEllouise Newer 05/09/2014, 5:33 PM  Camille Bal [place], [place]

## 2014-05-09 NOTE — Progress Notes (Signed)
188 reported febrile Remarked of sharp pain of both feet more so on soles and lateral areas of feet . Warm skin to touch . No swelling no redness noted

## 2014-05-09 NOTE — ED Notes (Signed)
Amp d 50 given.

## 2014-05-09 NOTE — ED Notes (Signed)
Notified RN of CBG 45!!!!

## 2014-05-09 NOTE — Consult Note (Signed)
Consult Note  Patient ID: ROTH RESS MRN: [id number], DOB: 04-04-1941 Date of Encounter: 05/09/2014, 3:01 PM Primary Physician: [name], MD Primary Cardiologist: Angelena Form  Chief Complaint: palpitations and orthostatic hypotension  HPI:  ABDIAZIZ KLAHN is a 73 y.o. male with a history of CAD s/p remote PCI with directional arthrectomy of LAD (1994), microvascular angina, diastolic heart failure, HTN, hyperlipidemia, and SVT s/p ablation 10-15 years ago (details not available).  He was last seen by cardiology 01-22-14 at which time he was doing well without chest pain or shortness of breath. Recommendations included continuing compression hose and prn lasix for increasing daily weights.    He was scheduled for carpal tunnel surgery about 3 weeks ago, but on presentation, his CBG was >400 and this was cancelled.  Since then he has had an intentional 20 pound weight loss as well as focused diabetic control.  He has been followed by his PCP and his endocrinologist (Dr Buddy Duty) closely.  He was also noted to have leukocytosis in follow up with Dr Buddy Duty and was placed on an antibiotic (unsure of which one).  He was unable to tolerate the antibiotic due to dysphagia and esophagitis.  This was discontinued and his symptoms resolved.  His PCP also discontinued Lisinopril, from his understanding this was due to elevated WBC.  This was restarted 2 days ago.    After Lisinopril restarted, he has had dizziness and pre-syncope.  He took lisinopril last night and this morning got up to go into the kitchen to take his insulin and eat breakfast.  He was short of breath when he got to the kitchen.  FBS was 109.  He then developed tachypalpitations associated with chest pressure and pre-syncope.  His palpitations were distinctly different from what he had prior to his ablation.  He took his insulin but then didn't eat because he felt unwell and went back to the bedroom to get his wife but was increasingly  pre-syncopal.  They decided to present to the ER for further evaluation.    On arrival, he was found to be orthostatic SBP dropped from 98 to 88 with standing, however, he was not symptomatic with this. Lab work on admission is notable for WBC of 26.9, Hgb 12.1 (baseline 14), glucose 38, creat 1.44 (baseline 1.3). ECG w/ subtle ST depression in inf/lat leads.  Trop neg.  Past Medical History  Diagnosis Date  . Diabetes mellitus   . Hyperlipidemia   . Hypertension   . GERD (gastroesophageal reflux disease)   . Coronary artery disease     a. S/P prior PTCA in the 90's;  b. 04/2007 Cath: LM nl, LAD Ca2+ prox, LCX nl, RI nl, OM nl, RCA 40-50p, 40d, EF 60%;  b. 09/2012 Echo: EF 55-60%, mild LVH, nl wall motion, Gr 1 DD, mildly dil LA.  . Right ureteral stone   . SVT (supraventricular tachycardia)     a. s/p ablation per Dr. Lovena Le 10 -35yrs ago  . OSA on CPAP     a. cpap setting of 16  . Arthritis   . Bilateral kidney stones   . Shortness of breath dyspnea   . CKD (chronic kidney disease), stage III      Most Recent Cardiac Studies: LHC (11/08): Mid LAD 30%, proximal circumflex 50%, OM2 40%, proximal RCA 40-50%, EF 60% Echo (4/14): Mild LVH, EF 55-60%, normal wall motion, grade 1 diastolic dysfunction, mild LAE ETT (9/13) no ischemic ST changes   Surgical History:  Past Surgical  History  Procedure Laterality Date  . Right ureteroscopic stone extraction  09-26-2010  . Circumcision and fulgeration of condyloma  06-22-2003  . Cardiac electrophysiology study and ablation  2002  . Transthoracic echocardiogram  03-03-2011    MODERATE LVH/ LVSF NORMAL / EF 60-65%/ MILDLY DILATED LEFT ATRIUMK  . Severeal ureteroscopic stone extractions    . Ureteroscopy  11/13/2011    Procedure: URETEROSCOPY;  Surgeon: Claybon Jabs, MD;  Location: [facility];  Service: Urology;  Laterality: Right;  RIGHT URETEROSCOPY WITH HOLMIUM LASER LIHTOTRIPSY DIGITAL URETEROSCOPE  . Coronary  angioplasty  1994    [place] OF THE LAD  . Cardiac catheterization  04-27-2007    CAD WITH 30% NARROWING IN THE MID-LAD/ 50 % NARROWING PROXIMAL CIRCUMFLEX/ 40% NARROWING SECOND MARGINAL BRANCH/ 40-50% PROXIMAL RCA WITH DISTAL POSTERIOR NARROWING/ NORMAL LVF  . Cardiac catheterization  2000    NON-OBSTRUCTIVE CAD  . Laparoscopic cholecystectomy  03-26-2005  . Total knee arthroplasty  07/18/2012    Procedure: TOTAL KNEE ARTHROPLASTY;  Surgeon: Johnn Hai, MD;  Location: [facility];  Service: Orthopedics;  Laterality: Right;    Home Meds: Prior to Admission medications   Medication Sig Start Date End Date Taking? Authorizing Provider  aspirin EC 81 MG tablet Take 81 mg by mouth daily.   Yes Historical Provider, MD  atorvastatin (LIPITOR) 20 MG tablet Take 20 mg by mouth at bedtime.   Yes Historical Provider, MD  doxazosin (CARDURA) 2 MG tablet Take 2 mg by mouth daily before breakfast.    Yes Historical Provider, MD  furosemide (LASIX) 20 MG tablet Take 60 mg by mouth daily. 01/09/14  Yes Burnell Blanks, MD  insulin NPH-regular Human (NOVOLIN 70/30) (70-30) 100 UNIT/ML injection Inject 45-65 Units into the skin 2 (two) times daily with a meal. 70 units in am and 90 units in pm   Yes Historical Provider, MD  lisinopril (PRINIVIL,ZESTRIL) 20 MG tablet Take 20 mg by mouth daily.   Yes Historical Provider, MD  metFORMIN (GLUCOPHAGE-XR) 500 MG 24 hr tablet Take 500 mg by mouth daily with breakfast.  05/01/14  Yes Historical Provider, MD  metoprolol (LOPRESSOR) 100 MG tablet Take 50 mg by mouth 2 (two) times daily.    Yes Historical Provider, MD  Multiple Vitamins-Minerals (PRESERVISION AREDS PO) Take 1 tablet by mouth 2 (two) times daily.   Yes Historical Provider, MD  omeprazole (PRILOSEC) 20 MG capsule Take 40 mg by mouth daily.    Yes Historical Provider, MD  potassium chloride SA (K-DUR,KLOR-CON) 20 MEQ tablet Take 1 tablet (20 mEq total) by mouth 2 (two) times daily. 01/09/14  Yes Burnell Blanks, MD   Allergies:  Allergies  Allergen Reactions  . Clindamycin/Lincomycin Other (See Comments)    dysphagia  . Codeine Itching  . Penicillins Rash    Social History  . Marital Status: Married    Spouse Name: N/A    Number of Children: N/A  . Years of Education: N/A   Social History Main Topics  . Smoking status: Former Smoker -- 1.00 packs/day for 15 years    Types: Cigarettes    Quit date: 06/15/1986  . Smokeless tobacco: Never Used  . Alcohol Use: Yes     Comment: occasional  . Drug Use: No  . Sexual Activity: Not on file   Family History  Problem Relation Age of Onset  . Heart attack Mother   . Heart attack Brother   . Colon cancer Neg Hx   .  Stomach cancer Neg Hx    Review of Systems: General: negative for chills, fever, night sweats.  He has had a 20 pound intentional weight loss in last 3 weeks Cardiovascular: chest pressure and palpitations as above, chronic LE edema (wears compression hose), worsening dyspnea on exertion as above, orthopnea, paroxysmal nocturnal dyspnea. Dermatological: negative for rash Respiratory: negative for cough or wheezing Urologic: negative for hematuria, dysuria Abdominal: has had darkened stools for last 3 weeks, negative for nausea, vomiting, diarrhea, bright red blood per rectum, or hematemesis Neurologic: pre-syncope as above, otherwise negative for visual changes, syncope  All other systems reviewed and are otherwise negative except as noted above.  Labs:   Lab Results  Component Value Date   WBC 21.4* 05/09/2014   HGB 10.9* 05/09/2014   HCT 32.0* 05/09/2014   MCV 95.0 05/09/2014   PLT 250 05/09/2014     Recent Labs Lab 05/09/14 0755  NA 135*  K 4.0  CL 99  CO2 18*  BUN 33*  CREATININE 1.44*  CALCIUM 9.2  PROT 6.9  BILITOT 0.7  ALKPHOS 63  ALT 15  AST 18  GLUCOSE 38*   Radiology/Studies:  Dg Chest 2 View 05/01/2014   CLINICAL DATA:  Leukocytosis for 5 days, lightheaded, diabetes mellitus,  hypertension, GERD, coronary artery disease, former smoker  EXAM: CHEST  2 VIEW IMPRESSION: Enlargement of cardiac silhouette.  Mild chronic bronchitic changes.  No acute abnormalities.   Electronically Signed   By: Lavonia Dana M.D.   On: 05/01/2014 16:55   Dg Chest Port 1 View 05/09/2014   CLINICAL DATA:  Chest pain  EXAM: PORTABLE CHEST - 1 VIEW IMPRESSION: No acute abnormality seen.   Electronically Signed   By: Inez Catalina M.D.   On: 05/09/2014 08:23    EKG: sinus tach, 106, subtle inf/lat ST dep, twi III.  Physical Exam: Blood pressure 116/41, pulse 85, temperature 97.9 F (36.6 C), temperature source Oral, resp. rate 18, height 6\' 5"  (1.956 m), weight 296 lb 4.8 oz (134.4 kg), SpO2 98 %. General: Obese, well nourished, in no acute distress. Head: Normocephalic, atraumatic, sclera non-icteric, no xanthomas, nares are without discharge.  Neck: Negative for carotid bruits  Lungs: Clear bilaterally to auscultation without wheezes, rales, or rhonchi. Breathing is unlabored. Heart: RRR with S1 S2. No murmurs, rubs, or gallops appreciated. Abdomen: Obese, soft, non-tender, non-distended with normoactive bowel sounds. No hepatomegaly. No rebound/guarding. No obvious abdominal masses. Msk:  Strength and tone appear normal for age. Extremities: No clubbing or cyanosis. No edema.  Distal pedal pulses are 2+ and equal bilaterally. Neuro: Alert and oriented X 3. No focal deficit. No facial asymmetry. Moves all extremities spontaneously. Psych:  Responds to questions appropriately with a normal affect.   ASSESSMENT AND PLAN:   1.  Palpitations/H/O SVT s/p RFCA Pt developed tachypalpitations in the setting of presyncope and chest pressure yesterday.  This is the first recurrence of palps since ablation 10-15 years ago. No events on tele thus far. Lytes OK. Monitor on telemetry. BB on hold in setting of relative hypotn and orthostasis upon arrival. Check TSH, Mg, echo. Will consider outpt event  monitoring if no events captured while an inpatient. Because of the unpredictable nature of the patient's pre-syncope and palpitations, we feel that it is unlikely that we will reliably capture an arrhythmia using either a 24 or 48 holter monitor.  Thus we will elect to place a 30 day event monitor.  2.  Midsternal chest pain/CAD Chest pressure recurred  in the setting of palpitations and pre-syncope yesterday.  No recurrence since admission. ECG with subtle inflat ST depression. Initial troponin negative, continue to trend. Will check echo to re-eval EF as above. Cont ASA/Statin.  BB on hold in setting of #3. Given h/o remote PCI with more recent DOE, will also arrange for Myoview as an outpatient.  3.  Orthostatic hypotension Markedly orthostatic in the ER in setting of presyncope.   Outpatient blood pressure 130/60's with recent changes to his outpt lisinopril (holding and then resuming, not changing doses). Anti-hypertensives currently on hold. Follow.  4.  Acute on chronic stage III CKD Creat up slightly - 1.44. Acei on hold in setting of above.  5.  Leukocytosis Afebrile. No specific Ss. Per IM.  6.  HL Cont statin therapy. LFT's wnl. Followed by PCP.  7.  DM Hypoglycemia upon admission, now improved. Per IM.  Signed, Chanetta Marshall, NP-S  History and all data above reviewed.  Patient examined.  I agree with the findings as above.  The patient reports light headedness with some palpitations.  However, this was not like previous SVT.  He doe have a BP that is lower than previous.  He has some orthostatic drop.  No clear association however and no recorded arrhythmia. The patient exam reveals COR:RRR  ,  Lungs: Clear  ,  Abd: Positive bowel sounds, no rebound no guarding, Ext No edema  .  All available labs, radiology testing, previous records reviewed. Agree with documented assessment and plan. Presyncope:  The etiology is not clearly cardiac.  We will check an echocardiogram.   Cycle cardiac enzymes.  Check outpatient event monitor.    Jeneen Rinks Leor Whyte  5:26 PM  05/09/2014

## 2014-05-09 NOTE — ED Notes (Signed)
Notified RN of CBG 130

## 2014-05-09 NOTE — ED Notes (Signed)
Admitting physician at bedside. Pt CBG at this time 95, sandwich given.

## 2014-05-09 NOTE — Progress Notes (Signed)
1400 With periods of  Feeling chilly.and feeling cool . Pt no ferver. Warm blankets and warm bags applied .

## 2014-05-09 NOTE — ED Notes (Signed)
CBG 45. md notified.

## 2014-05-09 NOTE — ED Notes (Signed)
Attempted to give report to [place]. Will call back in 10 minutes.

## 2014-05-09 NOTE — H&P (Signed)
PATIENT DETAILS Name: [name] Age: 73 y.o. Sex: male Date of Birth: 06/26/1940 Admit Date: 05/09/2014 OTL:XBWI,[email or web address], MD   CHIEF COMPLAINT:  Palpitations/chest pain/shortness of breath this morning Orthostatic hypotension-2 weeks Leukocytosis-2 weeks  HPI: [name] is a 73 y.o. male with a Past Medical History of coronary artery disease, chronic diastolic heart failure, diabetes, hypertension, dyslipidemia who presents today with the above noted complaint. Approximately 2 weeks back, patient was supposed to have carpal tunnel surgery and this was canceled because of hyperglycemia (sugar more than 400). During the same time, patient developed lightheadedness particularly on standing. He saw his primary care practitioner who took him off lisinopril, and made some adjustment to his insulin/diabetic regimen. He also had blood work then, that showed leukocytosis. Following these adjustments, patient was doing relatively well, until he saw his primary care practitioner yesterday and was asked to resume his lisinopril. This morning, upon waking up and standing, patient experienced lightheadedness, unsteady gait along with severe palpitations. Patient stumbled his way to the kitchen and check his CBG which was 104. He continued to have palpitations while sitting, and then started having retrosternal chest pain. Patient claims he thought he was having a heart attack. He then slowly stood up and made his way back to his room, his wife then brought him to the emergency room for further evaluation and treatment. In the ED, patient supine blood pressure and heart rate was 106/53 and 66 respectively, on standing blood pressure was 88/43 and heart rate of 82. During my evaluation, patient was completely asymptomatic, I did stand him up without any major symptoms.  Patient denies any recent nausea, vomiting or diarrhea. Upon from the above changes in his medications-no further changes were  made recently. He was also found to have mild elevation of his creatinine level, and leukocytosis of 26,000. He denies any fever, chills or sweats. He denies any night sweats. There is no history of cough or shortness of breath. No history of abdominal pain. No history of dysuria.   ALLERGIES:   Allergies  Allergen Reactions  . Clindamycin/Lincomycin Other (See Comments)    dysphagia  . Codeine Itching  . Penicillins Rash    PAST MEDICAL HISTORY: Past Medical History  Diagnosis Date  . Diabetes mellitus   . Hyperlipidemia   . Hypertension   . S/P ablation of ventricular arrhythmia   . GERD (gastroesophageal reflux disease)   . History of kidney stones   . Coronary artery disease CARDIOLOGIST - DR Lauree Chandler-  LAST VISIT 01-27-2011  IN [facility]  . Right ureteral stone   . SVT (supraventricular tachycardia)     s/p ablation per Dr. Lovena Le 10 -58yrs ago  . OSA on CPAP     cpap setting of 16  . Arthritis   . Bilateral kidney stones     PAST SURGICAL HISTORY: Past Surgical History  Procedure Laterality Date  . Right ureteroscopic stone extraction  09-26-2010  . Circumcision and fulgeration of condyloma  06-22-2003  . Cardiac electrophysiology study and ablation  2002  . Transthoracic echocardiogram  03-03-2011    MODERATE LVH/ LVSF NORMAL / EF 60-65%/ MILDLY DILATED LEFT ATRIUMK  . Severeal ureteroscopic stone extractions    . Ureteroscopy  11/13/2011    Procedure: URETEROSCOPY;  Surgeon: Claybon Jabs, MD;  Location: [facility];  Service: Urology;  Laterality: Right;  RIGHT URETEROSCOPY WITH HOLMIUM LASER LIHTOTRIPSY DIGITAL URETEROSCOPE  . Coronary angioplasty  1994  [place] OF THE LAD  . Cardiac catheterization  04-27-2007    CAD WITH 30% NARROWING IN THE MID-LAD/ 50 % NARROWING PROXIMAL CIRCUMFLEX/ 40% NARROWING SECOND MARGINAL BRANCH/ 40-50% PROXIMAL RCA WITH DISTAL POSTERIOR NARROWING/ NORMAL LVF  . Cardiac catheterization  2000    NON-OBSTRUCTIVE  CAD  . Laparoscopic cholecystectomy  03-26-2005  . Total knee arthroplasty  07/18/2012    Procedure: TOTAL KNEE ARTHROPLASTY;  Surgeon: Johnn Hai, MD;  Location: [facility];  Service: Orthopedics;  Laterality: Right;    MEDICATIONS AT HOME: Prior to Admission medications   Medication Sig Start Date End Date Taking? Authorizing Provider  aspirin EC 81 MG tablet Take 81 mg by mouth daily.   Yes Historical Provider, MD  atorvastatin (LIPITOR) 20 MG tablet Take 20 mg by mouth at bedtime.   Yes Historical Provider, MD  doxazosin (CARDURA) 2 MG tablet Take 2 mg by mouth daily before breakfast.    Yes Historical Provider, MD  furosemide (LASIX) 20 MG tablet Take 60 mg by mouth daily. 01/09/14  Yes Burnell Blanks, MD  insulin NPH-regular Human (NOVOLIN 70/30) (70-30) 100 UNIT/ML injection Inject 45-65 Units into the skin 2 (two) times daily with a meal. 70 units in am and 90 units in pm   Yes Historical Provider, MD  lisinopril (PRINIVIL,ZESTRIL) 20 MG tablet Take 20 mg by mouth daily.   Yes Historical Provider, MD  metFORMIN (GLUCOPHAGE-XR) 500 MG 24 hr tablet Take 500 mg by mouth daily with breakfast.  05/01/14  Yes Historical Provider, MD  metoprolol (LOPRESSOR) 100 MG tablet Take 50 mg by mouth 2 (two) times daily.    Yes Historical Provider, MD  Multiple Vitamins-Minerals (PRESERVISION AREDS PO) Take 1 tablet by mouth 2 (two) times daily.   Yes Historical Provider, MD  omeprazole (PRILOSEC) 20 MG capsule Take 40 mg by mouth daily.    Yes Historical Provider, MD  potassium chloride SA (K-DUR,KLOR-CON) 20 MEQ tablet Take 1 tablet (20 mEq total) by mouth 2 (two) times daily. 01/09/14  Yes Burnell Blanks, MD    FAMILY HISTORY: Family History  Problem Relation Age of Onset  . Heart attack Mother   . Heart attack Brother   . Colon cancer Neg Hx   . Stomach cancer Neg Hx     SOCIAL HISTORY:  reports that he quit smoking about 27 years ago. His smoking use included Cigarettes. He  has a 15 pack-year smoking history. He has never used smokeless tobacco. He reports that he drinks alcohol. His drug history is not on file.  REVIEW OF SYSTEMS:  Constitutional:   No  weight loss, night sweats,  Fevers, chills, fatigue.  HEENT:    No headaches, Difficulty swallowing,Tooth/dental problems,Sore throat,  No sneezing, itching, ear ache, nasal congestion, post nasal drip,   Cardio-vascular: No chest pain,  Orthopnea, PND, swelling in lower extremities, anasarca,         dizziness, palpitations  GI:  No heartburn, indigestion, abdominal pain, nausea, vomiting, diarrhea, change in       bowel habits, loss of appetite  Resp: No shortness of breath with exertion or at rest.  No excess mucus, no productive cough, No non-productive cough,  No coughing up of blood.No change in color of mucus.No wheezing.No chest wall deformity  Skin:  no rash or lesions.  GU:  no dysuria, change in color of urine, no urgency or frequency.  No flank pain.  Musculoskeletal: No joint pain or swelling.  No decreased range of  motion.  No back pain.  Psych: No change in mood or affect. No depression or anxiety.  No memory loss.   PHYSICAL EXAM: Blood pressure 124/49, pulse 81, temperature 98 F (36.7 C), temperature source Oral, resp. rate 16, height 6\' 5"  (1.956 m), weight 131.543 kg (290 lb), SpO2 99 %.  General appearance :Awake, alert, not in any distress. Speech Clear. Not toxic Looking HEENT: Atraumatic and Normocephalic, pupils equally reactive to light and accomodation Neck: supple, no JVD. No cervical lymphadenopathy.  Chest:Good air entry bilaterally, no added sounds  CVS: S1 S2 regular, no murmurs.  Abdomen: Bowel sounds present, Non tender and not distended with no gaurding, rigidity or rebound. Extremities: B/L Lower Ext shows no edema, both legs are warm to touch Neurology: Awake alert, and oriented X 3, CN II-XII intact, Non focal Skin:No Rash Wounds:N/A  LABS ON  ADMISSION:   Recent Labs  05/09/14 0755  NA 135*  K 4.0  CL 99  CO2 18*  GLUCOSE 38*  BUN 33*  CREATININE 1.44*  CALCIUM 9.2    Recent Labs  05/09/14 0755  AST 18  ALT 15  ALKPHOS 63  BILITOT 0.7  PROT 6.9  ALBUMIN 2.7*   No results for input(s): LIPASE, AMYLASE in the last 72 hours.  Recent Labs  05/09/14 0755  WBC 26.9*  NEUTROABS 21.5*  HGB 12.1*  HCT 35.6*  MCV 96.2  PLT 337   No results for input(s): CKTOTAL, CKMB, CKMBINDEX, TROPONINI in the last 72 hours. No results for input(s): DDIMER in the last 72 hours. Invalid input(s): POCBNP   RADIOLOGIC STUDIES ON ADMISSION: Dg Chest Port 1 View  05/09/2014   CLINICAL DATA:  Chest pain  EXAM: PORTABLE CHEST - 1 VIEW  COMPARISON:  05/01/2014  FINDINGS: Cardiac shadow remains enlarged. The lungs are well aerated bilaterally without focal infiltrate or sizable effusion. Mild elevation of the right hemidiaphragm is again seen. No gross soft tissue abnormality is noted.  IMPRESSION: No acute abnormality seen.   Electronically Signed   By: Inez Catalina M.D.   On: 05/09/2014 08:23     EKG: Independently reviewed. ? Sinus tachycardia with APCs.  ASSESSMENT AND PLAN: Present on Admission:  . Palpitations: Not sure whether this is secondary to orthostatics, given his prior history of SVT-he would be monitored in the telemetry unit. Also hypoglycemic here in the emergency room, sugars while at home and was having symptoms was 104. Given prior history of SVT status post ablation, we'll ask cardiology to evaluate.  . Orthostatic hypotension: On numerous antihypertensive medications/diuretics-probable etiology. Mild renal insufficiency, probably secondary to overdiuresis. Stop all antihypertensives/diuretics for now, gently hydrate for 12 hours. He does have long-standing history of diabetes, could have underlying autonomic neuropathy. Recheck orthostatic vital signs post hydration, will need to be judicious when resuming back  antihypertensive/diuretic dictations.   . Hypoglycemia: Patient took his dose of insulin 70/30 this morning, and has not eaten yet. Likely the etiology for hypoglycemia. Encouraged diet, maintain on SSI for now, follow CBGs and resume insulin accordingly.   . Chest pain: Occurred during/when patient was having palpitations. Monitor in telemetry, cycle cardiac enzymes. Continue aspirin. Check 2-D echocardiogram. Cardiology consulted.  . Leukocytosis: No foci of infection apparent, chest x-ray and a UA negative for pneumonia and UTI. No other foci of infection apparent. Patient completely nontoxic appearing. He does not have a fever as well. Check blood culture, will save smear and have pathology review smear. Spoke with oncology-Dr. Julien Nordmann, who suggested  that if pathology review of smear was markedly abnormal he will then officially consult. Please consult Dr. Julien Nordmann accordingly. Follow blood cultures. Afebrile, if foci of infection and apparent upon follow-up, place on appropriate antibiotics.  . History of Paroxysmal SVT (supraventricular tachycardia): See above, monitor in telemetry   . Mixed hyperlipidemia: Continue statin.   Marland Kitchen HTN (hypertension): Currently with orthostatics, holding all antihypertensive medications. See above.   . Chronic diastolic CHF (congestive heart failure): Clinically compensated. Cautiously starting IV fluids for now. Please reassess volume status.   . CORONARY ATHEROSCLEROSIS NATIVE CORONARY ARTERY: Continue aspirin, see above.   Marland Kitchen AKI (acute kidney injury): Probably secondary to diuretics-hold all antihypertensive/diuretics, gently hydrate. Recheck electrolytes in a.m.  Further plan will depend as patient's clinical course evolves and further radiologic and laboratory data become available. Patient will be monitored closely.   Above noted plan was discussed with patient/family, they were in agreement.   DVT Prophylaxis: Prophylactic Lovenox   Code  Status: Full Code  Disposition Plan:Home when stable    Total time spent for admission equals 45 minutes.  [facility] Pager [phone]  If 7PM-7AM, please contact night-coverage [email or web address] Password [facility] 05/09/2014, 11:35 AM

## 2014-05-09 NOTE — ED Provider Notes (Signed)
CSN: [id number]     Arrival date & time 05/09/14  2878 History   First MD Initiated Contact with Patient 05/09/14 0754     Chief Complaint  Patient presents with  . Chest Pain  . Dizziness     (Consider location/radiation/quality/duration/timing/severity/associated sxs/prior Treatment) HPI 73 year old male with history of coronary artery disease, diabetes, prior cardiac ablation for SVT, presents with several days of lightheadedness with generalized weakness presyncope when he stands up and walks with blood sugars greater than 400 a couple weeks ago but blood sugar was close to 100 today, apparently had some mild dull chest pain for indeterminate time last night prior to bed and woke up this morning and had about an hour and a half episode of mild dull chest pressure without radiation but did have palpitations felt like his heart was racing with shortness of breath and generalized weakness this morning worse with standing and walking with presyncope without syncope without trauma without nausea without cough without fever without altered mental status without change in speech or swallowing with vision feeling as if he is walking in a fall corneas lightheaded without peripheral visual field loss or blindness in one eye or the other without lateralizing weakness or numbness or incoordination and without vertigo just lightheadedness with general weakness whenever he walks for the last several days. Position changes from lying to standing or sitting to standing make his lightheadedness worse and generalized weakness worse. He is currently pain-free. Past Medical History  Diagnosis Date  . Diabetes mellitus   . Hyperlipidemia   . Hypertension   . GERD (gastroesophageal reflux disease)   . Coronary artery disease     a. S/P prior PTCA in the 90's;  b. 04/2007 Cath: LM nl, LAD Ca2+ prox, LCX nl, RI nl, OM nl, RCA 40-50p, 40d, EF 60%;  b. 09/2012 Echo: EF 55-60%, mild LVH, nl wall motion, Gr 1 DD, mildly  dil LA.  . Right ureteral stone   . SVT (supraventricular tachycardia)     a. s/p ablation per Dr. Lovena Le 10 -68yrs ago  . OSA on CPAP     a. cpap setting of 16  . Arthritis   . Bilateral kidney stones   . Shortness of breath dyspnea   . CKD (chronic kidney disease), stage III    Past Surgical History  Procedure Laterality Date  . Right ureteroscopic stone extraction  09-26-2010  . Circumcision and fulgeration of condyloma  06-22-2003  . Cardiac electrophysiology study and ablation  2002  . Transthoracic echocardiogram  03-03-2011    MODERATE LVH/ LVSF NORMAL / EF 60-65%/ MILDLY DILATED LEFT ATRIUMK  . Severeal ureteroscopic stone extractions    . Ureteroscopy  11/13/2011    Procedure: URETEROSCOPY;  Surgeon: Claybon Jabs, MD;  Location: [facility];  Service: Urology;  Laterality: Right;  RIGHT URETEROSCOPY WITH HOLMIUM LASER LIHTOTRIPSY DIGITAL URETEROSCOPE  . Coronary angioplasty  1994    [place] OF THE LAD  . Cardiac catheterization  04-27-2007    CAD WITH 30% NARROWING IN THE MID-LAD/ 50 % NARROWING PROXIMAL CIRCUMFLEX/ 40% NARROWING SECOND MARGINAL BRANCH/ 40-50% PROXIMAL RCA WITH DISTAL POSTERIOR NARROWING/ NORMAL LVF  . Cardiac catheterization  2000    NON-OBSTRUCTIVE CAD  . Laparoscopic cholecystectomy  03-26-2005  . Total knee arthroplasty  07/18/2012    Procedure: TOTAL KNEE ARTHROPLASTY;  Surgeon: Johnn Hai, MD;  Location: [facility];  Service: Orthopedics;  Laterality: Right;   Family History  Problem Relation Age of Onset  .  Heart attack Mother   . Heart attack Brother   . Colon cancer Neg Hx   . Stomach cancer Neg Hx    History  Substance Use Topics  . Smoking status: Former Smoker -- 1.00 packs/day for 15 years    Types: Cigarettes    Quit date: 06/15/1986  . Smokeless tobacco: Never Used  . Alcohol Use: Yes     Comment: occasional    Review of Systems 10 Systems reviewed and are negative for acute change except as noted in the  HPI.   Allergies  Clindamycin/lincomycin; Codeine; and Penicillins  Home Medications   Prior to Admission medications   Medication Sig Start Date End Date Taking? Authorizing Provider  aspirin EC 81 MG tablet Take 81 mg by mouth daily.   Yes Historical Provider, MD  atorvastatin (LIPITOR) 20 MG tablet Take 20 mg by mouth at bedtime.   Yes Historical Provider, MD  doxazosin (CARDURA) 2 MG tablet Take 2 mg by mouth daily before breakfast.    Yes Historical Provider, MD  furosemide (LASIX) 20 MG tablet Take 60 mg by mouth daily. 01/09/14  Yes Burnell Blanks, MD  insulin NPH-regular Human (NOVOLIN 70/30) (70-30) 100 UNIT/ML injection Inject 45-65 Units into the skin 2 (two) times daily with a meal. 70 units in am and 90 units in pm   Yes Historical Provider, MD  lisinopril (PRINIVIL,ZESTRIL) 20 MG tablet Take 20 mg by mouth daily.   Yes Historical Provider, MD  metFORMIN (GLUCOPHAGE-XR) 500 MG 24 hr tablet Take 500 mg by mouth daily with breakfast.  05/01/14  Yes Historical Provider, MD  metoprolol (LOPRESSOR) 100 MG tablet Take 50 mg by mouth 2 (two) times daily.    Yes Historical Provider, MD  Multiple Vitamins-Minerals (PRESERVISION AREDS PO) Take 1 tablet by mouth 2 (two) times daily.   Yes Historical Provider, MD  omeprazole (PRILOSEC) 20 MG capsule Take 40 mg by mouth daily.    Yes Historical Provider, MD  potassium chloride SA (K-DUR,KLOR-CON) 20 MEQ tablet Take 1 tablet (20 mEq total) by mouth 2 (two) times daily. 01/09/14  Yes Burnell Blanks, MD   BP 133/58 mmHg  Pulse 88  Temp(Src) 101.2 F (38.4 C) (Oral)  Resp 18  Ht 6\' 5"  (1.956 m)  Wt 298 lb (135.172 kg)  BMI 35.33 kg/m2  SpO2 100% Physical Exam  Constitutional:  Awake, alert, nontoxic appearance with baseline speech for patient.  HENT:  Head: Atraumatic.  Mouth/Throat: No oropharyngeal exudate.  Eyes: EOM are normal. Pupils are equal, round, and reactive to light. Right eye exhibits no discharge. Left eye  exhibits no discharge.  No nystagmus and negative test of skew  Neck: Neck supple.  Cardiovascular: Normal rate and regular rhythm.   No murmur heard. Pulmonary/Chest: Effort normal and breath sounds normal. No stridor. No respiratory distress. He has no wheezes. He has no rales. He exhibits no tenderness.  Abdominal: Soft. Bowel sounds are normal. He exhibits no mass. There is no tenderness. There is no rebound.  Musculoskeletal: He exhibits no tenderness.  Baseline ROM, moves extremities with no obvious new focal weakness.  Lymphadenopathy:    He has no cervical adenopathy.  Neurological: He is alert.  Awake, alert, cooperative and aware of situation; motor strength 5/5 bilaterally; sensation normal to light touch bilaterally; peripheral visual fields full to confrontation; no facial asymmetry; tongue midline; major cranial nerves appear intact; no pronator drift, normal finger to nose bilaterally  Skin: No rash noted.  Psychiatric: He  has a normal mood and affect.  Nursing note and vitals reviewed.   ED Course  Procedures (including critical care time) Labs Review Labs Reviewed  CBC WITH DIFFERENTIAL - Abnormal; Notable for the following:    WBC 26.9 (*)    RBC 3.70 (*)    Hemoglobin 12.1 (*)    HCT 35.6 (*)    Neutrophils Relative % 80 (*)    Lymphocytes Relative 7 (*)    Monocytes Relative 13 (*)    Neutro Abs 21.5 (*)    Monocytes Absolute 3.5 (*)    All other components within normal limits  COMPREHENSIVE METABOLIC PANEL - Abnormal; Notable for the following:    Sodium 135 (*)    CO2 18 (*)    Glucose, Bld 38 (*)    BUN 33 (*)    Creatinine, Ser 1.44 (*)    Albumin 2.7 (*)    GFR calc non Af Amer 47 (*)    GFR calc Af Amer 55 (*)    Anion gap 18 (*)    All other components within normal limits  GLUCOSE, CAPILLARY - Abnormal; Notable for the following:    Glucose-Capillary 194 (*)    All other components within normal limits  CBC - Abnormal; Notable for the  following:    WBC 21.4 (*)    RBC 3.37 (*)    Hemoglobin 10.9 (*)    HCT 32.0 (*)    All other components within normal limits  CREATININE, SERUM - Abnormal; Notable for the following:    GFR calc non Af Amer 57 (*)    GFR calc Af Amer 67 (*)    All other components within normal limits  GLUCOSE, CAPILLARY - Abnormal; Notable for the following:    Glucose-Capillary 303 (*)    All other components within normal limits  MAGNESIUM - Abnormal; Notable for the following:    Magnesium 1.2 (*)    All other components within normal limits  CBC WITH DIFFERENTIAL - Abnormal; Notable for the following:    WBC 17.7 (*)    RBC 3.28 (*)    Hemoglobin 10.5 (*)    HCT 31.1 (*)    Neutrophils Relative % 82 (*)    Neutro Abs 14.6 (*)    Lymphocytes Relative 9 (*)    Monocytes Absolute 1.6 (*)    All other components within normal limits  COMPREHENSIVE METABOLIC PANEL - Abnormal; Notable for the following:    Sodium 135 (*)    CO2 16 (*)    Glucose, Bld 274 (*)    BUN 30 (*)    Calcium 8.0 (*)    Total Protein 5.6 (*)    Albumin 2.0 (*)    GFR calc non Af Amer 55 (*)    GFR calc Af Amer 63 (*)    All other components within normal limits  GLUCOSE, CAPILLARY - Abnormal; Notable for the following:    Glucose-Capillary 238 (*)    All other components within normal limits  GLUCOSE, CAPILLARY - Abnormal; Notable for the following:    Glucose-Capillary 292 (*)    All other components within normal limits  GLUCOSE, CAPILLARY - Abnormal; Notable for the following:    Glucose-Capillary 320 (*)    All other components within normal limits  CBG MONITORING, ED - Abnormal; Notable for the following:    Glucose-Capillary 45 (*)    All other components within normal limits  CBG MONITORING, ED - Abnormal; Notable for the following:  Glucose-Capillary 130 (*)    All other components within normal limits  CULTURE, BLOOD (ROUTINE X 2)  CULTURE, BLOOD (ROUTINE X 2)  URINE CULTURE  URINALYSIS,  ROUTINE W REFLEX MICROSCOPIC  SAVE SMEAR  TROPONIN I  TROPONIN I  TROPONIN I  TSH  LACTIC ACID, PLASMA  INFLUENZA PANEL BY PCR (TYPE A & B, H1N1)  PATHOLOGIST SMEAR REVIEW  I-STAT TROPOININ, ED  I-STAT CG4 LACTIC ACID, ED  I-STAT CG4 LACTIC ACID, ED  CBG MONITORING, ED  I-STAT CG4 LACTIC ACID, ED  POCT I-STAT TROPONIN I    Imaging Review Dg Chest Port 1 View  05/09/2014   CLINICAL DATA:  Chest pain  EXAM: PORTABLE CHEST - 1 VIEW  COMPARISON:  05/01/2014  FINDINGS: Cardiac shadow remains enlarged. The lungs are well aerated bilaterally without focal infiltrate or sizable effusion. Mild elevation of the right hemidiaphragm is again seen. No gross soft tissue abnormality is noted.  IMPRESSION: No acute abnormality seen.   Electronically Signed   By: Inez Catalina M.D.   On: 05/09/2014 08:23     EKG Interpretation   Date/Time:  Wednesday May 09 2014 07:20:51 EST Ventricular Rate:  106 PR Interval:  150 QRS Duration: 90 QT Interval:  342 QTC Calculation: 433 R Axis:   -14 Text Interpretation:  Sinus tachycardia with Premature atrial complexes  Otherwise normal ECG Compared to previous tracing Rate faster Confirmed by  [facility]  MD, Jenny Reichmann ([id number]) on 05/09/2014 7:55:51 AM      MDM   Final diagnoses:  Chest pain  Generalized weakness  Leukocytosis  Palpitations    Patient / Family / Caregiver understand and agree with initial ED impression and plan with expectations set for ED visit. Patient / Family / Caregiver informed of clinical course, understand medical decision-making process, and agree with plan. D/w [facility] for Obs. The patient appears reasonably stabilized for admission considering the current resources, flow, and capabilities available in the ED at this time, and I doubt any other [facility] requiring further screening and/or treatment in the ED prior to admission.   Babette Relic, MD 05/10/14 [phone]

## 2014-05-10 ENCOUNTER — Inpatient Hospital Stay (HOSPITAL_COMMUNITY): Payer: Medicare Other

## 2014-05-10 DIAGNOSIS — K922 Gastrointestinal hemorrhage, unspecified: Secondary | ICD-10-CM | POA: Diagnosis present

## 2014-05-10 DIAGNOSIS — C772 Secondary and unspecified malignant neoplasm of intra-abdominal lymph nodes: Secondary | ICD-10-CM | POA: Diagnosis not present

## 2014-05-10 DIAGNOSIS — A419 Sepsis, unspecified organism: Secondary | ICD-10-CM

## 2014-05-10 DIAGNOSIS — R6510 Systemic inflammatory response syndrome (SIRS) of non-infectious origin without acute organ dysfunction: Secondary | ICD-10-CM

## 2014-05-10 LAB — TROPONIN I: Troponin I: <0.3 ng/mL (ref <0.30)

## 2014-05-10 LAB — GLUCOSE, CAPILLARY
GLUCOSE-CAPILLARY: 415 mg/dL — AB (ref 70–99)
Glucose-Capillary: 292 mg/dL — ABNORMAL HIGH (ref 70–99)
Glucose-Capillary: 320 mg/dL — ABNORMAL HIGH (ref 70–99)
Glucose-Capillary: 344 mg/dL — ABNORMAL HIGH (ref 70–99)
Glucose-Capillary: 432 mg/dL — ABNORMAL HIGH (ref 70–99)

## 2014-05-10 LAB — URINE CULTURE
CULTURE: NO GROWTH
Colony Count: NO GROWTH

## 2014-05-10 LAB — INFLUENZA PANEL BY PCR (TYPE A & B)
H1N1 flu by pcr: NOT DETECTED
INFLAPCR: NEGATIVE
INFLBPCR: NEGATIVE

## 2014-05-10 LAB — LACTATE DEHYDROGENASE: LDH: 172 U/L (ref 94–250)

## 2014-05-10 LAB — LACTIC ACID, PLASMA: LACTIC ACID, VENOUS: 1.3 mmol/L (ref 0.5–2.2)

## 2014-05-10 LAB — MAGNESIUM: Magnesium: 1.2 mg/dL — ABNORMAL LOW (ref 1.5–2.5)

## 2014-05-10 LAB — TSH: TSH: 0.714 u[IU]/mL (ref 0.350–4.500)

## 2014-05-10 MED ORDER — MAGNESIUM SULFATE 2 GM/50ML IV SOLN
2.0000 g | Freq: Once | INTRAVENOUS | Status: AC
  Administered 2014-05-10: 2 g via INTRAVENOUS
  Filled 2014-05-10: qty 50

## 2014-05-10 MED ORDER — INSULIN ASPART 100 UNIT/ML ~~LOC~~ SOLN: 0.0000 [IU] | SUBCUTANEOUS | Status: DC

## 2014-05-10 MED ORDER — SODIUM CHLORIDE 0.9 % IV SOLN
8.0000 mg/h | INTRAVENOUS | Status: DC
  Administered 2014-05-11 – 2014-05-12 (×4): 8 mg/h via INTRAVENOUS
  Filled 2014-05-10 (×8): qty 80

## 2014-05-10 MED ORDER — INSULIN ASPART PROT & ASPART (70-30 MIX) 100 UNIT/ML ~~LOC~~ SUSP
45.0000 [IU] | Freq: Every day | SUBCUTANEOUS | Status: DC
  Filled 2014-05-10: qty 10

## 2014-05-10 MED ORDER — SODIUM CHLORIDE 0.9 % IV SOLN
INTRAVENOUS | Status: DC
  Administered 2014-05-10: 17:00:00 via INTRAVENOUS

## 2014-05-10 MED ORDER — INSULIN ASPART 100 UNIT/ML ~~LOC~~ SOLN
0.0000 [IU] | Freq: Every day | SUBCUTANEOUS | Status: DC
  Administered 2014-05-10: 5 [IU] via SUBCUTANEOUS

## 2014-05-10 MED ORDER — SODIUM CHLORIDE 0.9 % IV SOLN
2500.0000 mg | INTRAVENOUS | Status: AC
  Administered 2014-05-10: 2500 mg via INTRAVENOUS
  Filled 2014-05-10: qty 2500

## 2014-05-10 MED ORDER — SODIUM CHLORIDE 0.9 % IV BOLUS (SEPSIS)
500.0000 mL | Freq: Once | INTRAVENOUS | Status: AC
  Administered 2014-05-11: 500 mL via INTRAVENOUS

## 2014-05-10 MED ORDER — PANTOPRAZOLE SODIUM 40 MG IV SOLR
80.0000 mg | Freq: Once | INTRAVENOUS | Status: AC
  Administered 2014-05-11: 80 mg via INTRAVENOUS
  Filled 2014-05-10: qty 80

## 2014-05-10 MED ORDER — PROTAMINE SULFATE 10 MG/ML IV SOLN
35.0000 mg | Freq: Once | INTRAVENOUS | Status: AC
  Administered 2014-05-10: 35 mg via INTRAVENOUS
  Filled 2014-05-10: qty 3.5

## 2014-05-10 MED ORDER — VANCOMYCIN HCL 10 G IV SOLR
1250.0000 mg | Freq: Two times a day (BID) | INTRAVENOUS | Status: DC
  Administered 2014-05-10: 1250 mg via INTRAVENOUS
  Filled 2014-05-10 (×3): qty 1250

## 2014-05-10 MED ORDER — PANTOPRAZOLE SODIUM 40 MG IV SOLR: 40.0000 mg | Freq: Two times a day (BID) | INTRAVENOUS | Status: DC

## 2014-05-10 MED ORDER — INSULIN ASPART 100 UNIT/ML ~~LOC~~ SOLN
18.0000 [IU] | Freq: Once | SUBCUTANEOUS | Status: AC
  Administered 2014-05-10: 18 [IU] via SUBCUTANEOUS

## 2014-05-10 MED ORDER — DEXTROSE 5 % IV SOLN
1.0000 g | Freq: Three times a day (TID) | INTRAVENOUS | Status: DC
  Administered 2014-05-10 (×3): 1 g via INTRAVENOUS
  Filled 2014-05-10 (×8): qty 1

## 2014-05-10 MED ORDER — IOHEXOL 300 MG/ML  SOLN
25.0000 mL | INTRAMUSCULAR | Status: AC
  Administered 2014-05-10 (×2): 25 mL via ORAL

## 2014-05-10 MED ORDER — INSULIN ASPART PROT & ASPART (70-30 MIX) 100 UNIT/ML ~~LOC~~ SUSP: 20.0000 [IU] | Freq: Every day | SUBCUTANEOUS | Status: DC

## 2014-05-10 MED ORDER — IOHEXOL 350 MG/ML SOLN
100.0000 mL | Freq: Once | INTRAVENOUS | Status: AC | PRN
  Administered 2014-05-10: 80 mL via INTRAVENOUS

## 2014-05-10 MED ORDER — PANTOPRAZOLE SODIUM 40 MG IV SOLR: 40.0000 mg | Freq: Once | INTRAVENOUS | Status: DC

## 2014-05-10 MED ORDER — INSULIN ASPART PROT & ASPART (70-30 MIX) 100 UNIT/ML ~~LOC~~ SUSP
30.0000 [IU] | Freq: Every day | SUBCUTANEOUS | Status: DC
  Filled 2014-05-10: qty 10

## 2014-05-10 MED ORDER — INSULIN ASPART PROT & ASPART (70-30 MIX) 100 UNIT/ML ~~LOC~~ SUSP
20.0000 [IU] | Freq: Every day | SUBCUTANEOUS | Status: DC
  Administered 2014-05-10: 20 [IU] via SUBCUTANEOUS

## 2014-05-10 NOTE — Progress Notes (Signed)
RN called RT to say that pt had brought home CPAP and was already wearing it. RN got an order for CPAP and pt is fine at this time.

## 2014-05-10 NOTE — Progress Notes (Signed)
ANTIBIOTIC CONSULT NOTE - INITIAL  Pharmacy Consult for vancomycin and cefepime Indication: rule out sepsis  Allergies  Allergen Reactions  . Clindamycin/Lincomycin Other (See Comments)    dysphagia  . Codeine Itching  . Penicillins Rash    Patient Measurements: Height: 6\' 5"  (195.6 cm) Weight: 298 lb (135.172 kg) (scale b) IBW/kg (Calculated) : 89.1  Vital Signs: Temp: 101.2 F (38.4 C) (11/26 0559) Temp Source: Oral (11/26 0559) BP: 152/77 mmHg (11/26 0227) Pulse Rate: 99 (11/26 0254) Intake/Output from previous day: 11/25 0701 - 11/26 0700 In: 4661.3 [P.O.:960; I.V.:3651.3; IV Piggyback:50] Out: 644 [Urine:875] Intake/Output from this shift: Total I/O In: 200 [I.V.:200] Out: 200 [Urine:200]  Labs:  Recent Labs  05/09/14 0755 05/09/14 1300 05/09/14 2037  WBC 26.9* 21.4* 17.7*  HGB 12.1* 10.9* 10.5*  PLT 337 250 254  CREATININE 1.44* 1.22 1.27   Estimated Creatinine Clearance: 79.9 mL/min (by C-G formula based on Cr of 1.27). No results for input(s): VANCOTROUGH, VANCOPEAK, VANCORANDOM, GENTTROUGH, GENTPEAK, GENTRANDOM, TOBRATROUGH, TOBRAPEAK, TOBRARND, AMIKACINPEAK, AMIKACINTROU, AMIKACIN in the last 72 hours.   Microbiology: No results found for this or any previous visit (from the past 720 hour(s)).  Medical History: Past Medical History  Diagnosis Date  . Diabetes mellitus   . Hyperlipidemia   . Hypertension   . GERD (gastroesophageal reflux disease)   . Coronary artery disease     a. S/P prior PTCA in the 90's;  b. 04/2007 Cath: LM nl, LAD Ca2+ prox, LCX nl, RI nl, OM nl, RCA 40-50p, 40d, EF 60%;  b. 09/2012 Echo: EF 55-60%, mild LVH, nl wall motion, Gr 1 DD, mildly dil LA.  . Right ureteral stone   . SVT (supraventricular tachycardia)     a. s/p ablation per Dr. Lovena Le 10 -60yrs ago  . OSA on CPAP     a. cpap setting of 16  . Arthritis   . Bilateral kidney stones   . Shortness of breath dyspnea   . CKD (chronic kidney disease), stage III      Medications:  Prescriptions prior to admission  Medication Sig Dispense Refill Last Dose  . aspirin EC 81 MG tablet Take 81 mg by mouth daily.   05/09/2014 at Unknown time  . atorvastatin (LIPITOR) 20 MG tablet Take 20 mg by mouth at bedtime.   05/08/2014 at Unknown time  . doxazosin (CARDURA) 2 MG tablet Take 2 mg by mouth daily before breakfast.    05/09/2014 at Unknown time  . furosemide (LASIX) 20 MG tablet Take 60 mg by mouth daily.   05/08/2014 at Unknown time  . insulin NPH-regular Human (NOVOLIN 70/30) (70-30) 100 UNIT/ML injection Inject 45-65 Units into the skin 2 (two) times daily with a meal. 70 units in am and 90 units in pm   05/08/2014 at Unknown time  . lisinopril (PRINIVIL,ZESTRIL) 20 MG tablet Take 20 mg by mouth daily.   05/08/2014 at Unknown time  . metFORMIN (GLUCOPHAGE-XR) 500 MG 24 hr tablet Take 500 mg by mouth daily with breakfast.    05/09/2014 at Unknown time  . metoprolol (LOPRESSOR) 100 MG tablet Take 50 mg by mouth 2 (two) times daily.    05/09/2014 at 0700  . Multiple Vitamins-Minerals (PRESERVISION AREDS PO) Take 1 tablet by mouth 2 (two) times daily.   05/09/2014 at Unknown time  . omeprazole (PRILOSEC) 20 MG capsule Take 40 mg by mouth daily.    05/08/2014 at Unknown time  . potassium chloride SA (K-DUR,KLOR-CON) 20 MEQ tablet Take 1  tablet (20 mEq total) by mouth 2 (two) times daily. 60 tablet 6 05/08/2014 at Unknown time   Assessment: 73 y/o male who presented to the ED on 11/25 with CP, SOB, orthostatic hypotension for 2 weeks and persistent leukocytosis. Antibiotics were not started yesterday as patient was non toxic appearing. Today, he is febrile with an elevated WBC although it has trended down. Pharmacy consulted to begin vancomycin and cefepime. SCr was elevated on admit and has trended down to 1.27. UA  And CXR are negative. Blood cultures are pending.  Goal of Therapy:  Vancomycin trough level 15-20 mcg/ml Eradication of infection  Plan:  -  Vancomycin 2500 mg IV now then 1250 mg IV q12h - Cefepime 1 g IV q8h - Monitor renal function, clinical course and [employer], Pharm.D., BCPS Clinical Pharmacist Pager: [phone] 05/10/2014 7:44 AM

## 2014-05-10 NOTE — Progress Notes (Signed)
Patient Demographics  [name], is a 73 y.o. male, DOB - July 03, 1940, PPI:[id number]  Admit date - 05/09/2014   Admitting Physician Evalee Mutton Kristeen Mans, MD  Outpatient Primary MD for the patient is [name], MD  LOS - 1   Chief Complaint  Patient presents with  . Chest Pain  . Dizziness       Subjective:   [name] today has, No headache, No chest pain, No abdominal pain - No Nausea, No new weakness tingling or numbness, No Cough - SOB. Fever overnight of 103.  Assessment & Plan    Principal Problem:   Palpitations Active Problems:   Mixed hyperlipidemia   CORONARY ATHEROSCLEROSIS NATIVE CORONARY ARTERY   Paroxysmal SVT (supraventricular tachycardia)   AKI (acute kidney injury)   HTN (hypertension)   Chronic diastolic CHF (congestive heart failure)   Leukocytosis   Orthostatic hypotension   Chest pain   Hypoglycemia  SIRS: Patient had fever overnight of 13, has significant leukocytosis of 17,000 this morning, urine analysis is negative chest x-ray has no acute finding, blood cultures are negative so far. -Patient was started empirically on IV vancomycin and cefepime pending his septic workup. -We'll check CT chest/abdomen/pelvis with contrast to evaluate for any worse over infection or malignancy, will check LDH and pro-calcitonin.  Mixed hyperlipidemia: - Continue statin.  Diabetes mellitus - Uncontrolled, hemoglobin A1c - Continue with insulin sliding scale, will resume back on nonetheless 70/30, but we'll lower the dose .  Chronic diastolic CHF (congestive heart failure):  - Clinically compensated.   CORONARY ATHEROSCLEROSIS NATIVE CORONARY ARTERY:  - Continue aspirin, currently denies any further chest pain, has negative troponin 2.  Hypomagnesemia -Replaced, recheck in a.m.  Acute renal failure -Improved with  hydration    Code Status: Full  Family Communication: Patient is alert and oriented,  Disposition Plan: Remains on telemetry   Procedures  None   Consults   Cardiology   Medications  Scheduled Meds: . antiseptic oral rinse  7 mL Mouth Rinse BID  . aspirin EC  81 mg Oral Daily  . atorvastatin  20 mg Oral QHS  . ceFEPime (MAXIPIME) IV  1 g Intravenous 3 times per day  . enoxaparin (LOVENOX) injection  70 mg Subcutaneous Q24H  . gabapentin  200 mg Oral QHS  . insulin aspart  0-15 Units Subcutaneous TID WC  . [START ON 05/11/2014] insulin aspart protamine- aspart  30 Units Subcutaneous Q breakfast  . insulin aspart protamine- aspart  45 Units Subcutaneous Q supper  . pantoprazole  40 mg Oral Daily  . sodium chloride  3 mL Intravenous Q12H  . vancomycin  1,250 mg Intravenous Q12H   Continuous Infusions:  PRN Meds:.acetaminophen **OR** acetaminophen, albuterol, alum & mag hydroxide-simeth, feeding supplement (GLUCERNA SHAKE), guaiFENesin-dextromethorphan, ondansetron **OR** ondansetron (ZOFRAN) IV  DVT Prophylaxis  Lovenox -  Lab Results  Component Value Date   PLT 254 05/09/2014    Antibiotics    Anti-infectives    Start     Dose/Rate Route Frequency Ordered Stop   05/10/14 2100  vancomycin (VANCOCIN) 1,250 mg in sodium chloride 0.9 % 250 mL IVPB     1,250 mg166.7 mL/hr over 90 Minutes Intravenous Every 12 hours 05/10/14 0753     05/10/14  0830  vancomycin (VANCOCIN) 2,500 mg in sodium chloride 0.9 % 500 mL IVPB     2,500 mg250 mL/hr over 120 Minutes Intravenous NOW 05/10/14 0752 05/10/14 1128   05/10/14 0830  ceFEPIme (MAXIPIME) 1 g in dextrose 5 % 50 mL IVPB     1 g100 mL/hr over 30 Minutes Intravenous 3 times per day 05/10/14 0752            Objective:   Filed Vitals:   05/10/14 0227 05/10/14 0254 05/10/14 0559 05/10/14 0928  BP: 152/77   133/58  Pulse: 102 99  88  Temp: 98.1 F (36.7 C)  101.2 F (38.4 C)   TempSrc: Oral  Oral   Resp: 18 20 18     Height:      Weight:   135.172 kg (298 lb)   SpO2: 97% 97% 100%     Wt Readings from Last 3 Encounters:  05/10/14 135.172 kg (298 lb)  01/22/14 141.069 kg (311 lb)  01/13/14 138.982 kg (306 lb 6.4 oz)     Intake/Output Summary (Last 24 hours) at 05/10/14 1357 Last data filed at 05/10/14 0840  Gross per 24 hour  Intake 1961.25 ml  Output    825 ml  Net 1136.25 ml     Physical Exam  Awake Alert, Oriented X 3, No new F.N deficits, Normal affect [place].AT,PERRAL Supple Neck,No JVD, No cervical lymphadenopathy appriciated.  Symmetrical Chest wall movement, Good air movement bilaterally, CTAB RRR,No Gallops,Rubs or new Murmurs, No Parasternal Heave +ve B.Sounds, Abd Soft, No tenderness, No organomegaly appriciated, No rebound - guarding or rigidity. No Cyanosis, Clubbing or edema, No new Rash or bruise     Data Review   Micro Results Recent Results (from the past 240 hour(s))  Blood Culture (routine x 2)     Status: None (Preliminary result)   Collection Time: 05/09/14  9:10 AM  Result Value Ref Range Status   Specimen Description BLOOD LEFT FOREARM  Final   Special Requests BOTTLES DRAWN AEROBIC AND ANAEROBIC 5CC  Final   Culture  Setup Time   Final    05/09/2014 18:03 Performed at [facility]    Culture   Final           BLOOD CULTURE RECEIVED NO GROWTH TO DATE CULTURE WILL BE HELD FOR 5 DAYS BEFORE ISSUING A FINAL NEGATIVE REPORT Performed at [facility]    Report Status PENDING  Incomplete  Blood culture (routine x 2)     Status: None (Preliminary result)   Collection Time: 05/09/14 11:40 AM  Result Value Ref Range Status   Specimen Description BLOOD LEFT HAND  Final   Special Requests BOTTLES DRAWN AEROBIC ONLY 3CCS  Final   Culture  Setup Time   Final    05/09/2014 18:03 Performed at [facility]    Culture   Final           BLOOD CULTURE RECEIVED NO GROWTH TO DATE CULTURE WILL BE HELD FOR 5 DAYS BEFORE ISSUING A FINAL NEGATIVE  REPORT Performed at [facility]    Report Status PENDING  Incomplete    Radiology Reports Dg Chest Port 1 View  05/09/2014   CLINICAL DATA:  Chest pain  EXAM: PORTABLE CHEST - 1 VIEW  COMPARISON:  05/01/2014  FINDINGS: Cardiac shadow remains enlarged. The lungs are well aerated bilaterally without focal infiltrate or sizable effusion. Mild elevation of the right hemidiaphragm is again seen. No gross soft tissue abnormality is noted.  IMPRESSION: No acute abnormality seen.   Electronically Signed   By: Inez Catalina M.D.   On: 05/09/2014 08:23    CBC  Recent Labs Lab 05/09/14 0755 05/09/14 1300 05/09/14 2037  WBC 26.9* 21.4* 17.7*  HGB 12.1* 10.9* 10.5*  HCT 35.6* 32.0* 31.1*  PLT 337 250 254  MCV 96.2 95.0 94.8  MCH 32.7 32.3 32.0  MCHC 34.0 34.1 33.8  RDW 12.7 12.8 12.7  LYMPHSABS 1.9  --  1.5  MONOABS 3.5*  --  1.6*  EOSABS 0.0  --  0.0  BASOSABS 0.0  --  0.0    Chemistries   Recent Labs Lab 05/09/14 0755 05/09/14 1300 05/09/14 2037 05/10/14 0046  NA 135*  --  135*  --   K 4.0  --  4.6  --   CL 99  --  105  --   CO2 18*  --  16*  --   GLUCOSE 38*  --  274*  --   BUN 33*  --  30*  --   CREATININE 1.44* 1.22 1.27  --   CALCIUM 9.2  --  8.0*  --   MG  --   --   --  1.2*  AST 18  --  12  --   ALT 15  --  12  --   ALKPHOS 63  --  59  --   BILITOT 0.7  --  0.5  --    ------------------------------------------------------------------------------------------------------------------ estimated creatinine clearance is 79.9 mL/min (by C-G formula based on Cr of 1.27). ------------------------------------------------------------------------------------------------------------------ No results for input(s): HGBA1C in the last 72 hours. ------------------------------------------------------------------------------------------------------------------ No results for input(s): CHOL, HDL, LDLCALC, TRIG, CHOLHDL, LDLDIRECT in the last 72  hours. ------------------------------------------------------------------------------------------------------------------  Recent Labs  05/10/14 0046  TSH 0.714   ------------------------------------------------------------------------------------------------------------------ No results for input(s): VITAMINB12, FOLATE, FERRITIN, TIBC, IRON, RETICCTPCT in the last 72 hours.  Coagulation profile No results for input(s): INR, PROTIME in the last 168 hours.  No results for input(s): DDIMER in the last 72 hours.  Cardiac Enzymes  Recent Labs Lab 05/09/14 1300 05/09/14 1850 05/10/14 0046  TROPONINI <0.30 <0.30 <0.30   ------------------------------------------------------------------------------------------------------------------ Invalid input(s): POCBNP     Time Spent in minutes   30 miutes   ELGERGAWY, DAWOOD M.D on 05/10/2014 at 1:57 PM  Between 7am to 7pm - Pager - [phone]  After 7pm go to [email or web address] - password [email or web address]  And look for the night coverage person covering for me after hours  [facility] Office  [phone]   **Disclaimer: This note may have been dictated with voice recognition software. Similar sounding words can inadvertently be transcribed and this note may contain transcription errors which may not have been corrected upon publication of note.**

## 2014-05-10 NOTE — Progress Notes (Signed)
Subjective:  Episode of dizziness earlier today.  Temperature to 101 this morning.  No arrhythmias noted on monitoring.  Objective:  Vital Signs in the last 24 hours: BP 152/77 mmHg  Pulse 99  Temp(Src) 101.2 F (38.4 C) (Oral)  Resp 18  Ht 6\' 5"  (1.956 m)  Wt 135.172 kg (298 lb)  BMI 35.33 kg/m2  SpO2 100%  Physical Exam: Obese white male sitting in chair side of bed in no acute distress Lungs:  Clear  Cardiac:  Regular rhythm, normal S1 and S2, no S3 Abdomen:  Soft, nontender, no masses Extremities:  No edema present  Intake/Output from previous day: 11/25 0701 - 11/26 0700 In: 4661.3 [P.O.:960; I.V.:3651.3; IV Piggyback:50] Out: 154 [Urine:875] Weight Filed Weights   05/09/14 0724 05/09/14 1236 05/10/14 0559  Weight: 131.543 kg (290 lb) 134.4 kg (296 lb 4.8 oz) 135.172 kg (298 lb)    Lab Results: Basic Metabolic Panel:  Recent Labs  05/09/14 0755 05/09/14 1300 05/09/14 2037  NA 135*  --  135*  K 4.0  --  4.6  CL 99  --  105  CO2 18*  --  16*  GLUCOSE 38*  --  274*  BUN 33*  --  30*  CREATININE 1.44* 1.22 1.27    CBC:  Recent Labs  05/09/14 0755 05/09/14 1300 05/09/14 2037  WBC 26.9* 21.4* 17.7*  NEUTROABS 21.5*  --  14.6*  HGB 12.1* 10.9* 10.5*  HCT 35.6* 32.0* 31.1*  MCV 96.2 95.0 94.8  PLT 337 250 254    BNP    Component Value Date/Time   PROBNP 67.7 01/12/2014 1244    PROTIME: Lab Results  Component Value Date   INR 1.03 07/08/2012   INR 0.9 RATIO 04/26/2007    Telemetry: Sinus rhythm-no significant arrhythmias seen.  Since admission  Assessment/Plan:  1.  Dizziness of uncertain etiology associated with low blood pressure and orthostasis.  Unclear whether this low blood pressure was due to sepsis or to orthostasis. 2.  Continued fever with leukocytosis.  Recommendations:  Need to watch temperature curve and blood pressure.  Continue orthostatic blood pressure monitoring.  Await echocardiogram.     W. Doristine Church   MD [facility]  05/10/2014, 8:08 AM

## 2014-05-10 NOTE — Significant Event (Addendum)
Bedside visit at night due to new blood in stool.  Patient with one bloody BM at 6pm, he states he didn't tell anyone, but then after he had another at 11pm or so and so told his RN.  Stool is maroon color, has frank smell of GIB as well.  GI bleed, suspicious for UGIB (especially with the CT abd/pelvis findings earlier this evening of pancreatic / duodenal mass of 6cm).  Patient with 3rd bloody BM at 12 mn.  No emesis. Transfer to SDU Calling GI - spoke with Dr. Benson Norway who will see patient tomorrow. Increase IVF to 125 cc/hr, 500 cc bolus Pharm to give protamine. Stopping lovenox he was on for DVT ppx using SCDs instead Stopping ASA 81 H/H ordered and pending stat Diet changed to NPO and SSI changed to glucostabilizer given that he is currently running BGLs in the 400s right now anyhow. Activity changed to up with assist only as he is now at fall risk due to ortho stasis. PPI bolus and infusion.

## 2014-05-10 NOTE — Progress Notes (Signed)
Patient's Magnesium 1.2.  NP on call notified.  Replacement ordered.  Will administer and continue to monitor.

## 2014-05-10 NOTE — Progress Notes (Addendum)
CRITICAL VALUE ALERT  Critical value received:  CBG=415  Date of notification:  05-10-14  Time of notification:  1856  Critical value read back:yes  Nurse who received alert: Lonzo Cloud RN/BSN  MD notified (1st page):  Elgergawy  Time of first page: 1856  MD notified (2nd page):  Time of second page:  Responding MD:  Elgergawy  Time MD responded: [phone]

## 2014-05-10 NOTE — Progress Notes (Signed)
Pt uses home CPAP. Pt puts his self on and off CPAP. RT will continue to monitor.

## 2014-05-10 NOTE — Progress Notes (Addendum)
CRITICAL VALUE ALERT  Critical value received:  CBG=344  Date of notification:  05-10-14  Time of notification:  1621  Critical value read back:yes  Nurse who received alert: Lonzo Cloud RN/BSN  MD notified (1st page):  Elgergawy  Time of first page: 1621  MD notified (2nd page):  Time of second page:  Responding MD:  Elgergawy  Time MD responded: [phone]

## 2014-05-10 NOTE — Progress Notes (Signed)
  Echocardiogram 2D Echocardiogram has been performed.  [name] 05/10/2014, 10:57 AM

## 2014-05-11 DIAGNOSIS — K8689 Other specified diseases of pancreas: Secondary | ICD-10-CM

## 2014-05-11 DIAGNOSIS — K922 Gastrointestinal hemorrhage, unspecified: Secondary | ICD-10-CM

## 2014-05-11 DIAGNOSIS — I951 Orthostatic hypotension: Secondary | ICD-10-CM

## 2014-05-11 DIAGNOSIS — D62 Acute posthemorrhagic anemia: Secondary | ICD-10-CM

## 2014-05-11 DIAGNOSIS — K869 Disease of pancreas, unspecified: Secondary | ICD-10-CM

## 2014-05-11 DIAGNOSIS — R935 Abnormal findings on diagnostic imaging of other abdominal regions, including retroperitoneum: Secondary | ICD-10-CM

## 2014-05-11 LAB — CBC
HCT: 23.5 % — ABNORMAL LOW (ref 39.0–52.0)
HEMATOCRIT: 30.1 % — AB (ref 39.0–52.0)
HEMOGLOBIN: 10.1 g/dL — AB (ref 13.0–17.0)
Hemoglobin: 7.8 g/dL — ABNORMAL LOW (ref 13.0–17.0)
MCH: 31.2 pg (ref 26.0–34.0)
MCH: 32.1 pg (ref 26.0–34.0)
MCHC: 33.2 g/dL (ref 30.0–36.0)
MCHC: 33.6 g/dL (ref 30.0–36.0)
MCV: 94 fL (ref 78.0–100.0)
MCV: 95.6 fL (ref 78.0–100.0)
Platelets: 234 10*3/uL (ref 150–400)
Platelets: 236 10*3/uL (ref 150–400)
RBC: 2.5 MIL/uL — AB (ref 4.22–5.81)
RBC: 3.15 MIL/uL — ABNORMAL LOW (ref 4.22–5.81)
RDW: 12.7 % (ref 11.5–15.5)
RDW: 13.3 % (ref 11.5–15.5)
WBC: 17.5 10*3/uL — ABNORMAL HIGH (ref 4.0–10.5)
WBC: 21.6 10*3/uL — ABNORMAL HIGH (ref 4.0–10.5)

## 2014-05-11 LAB — GLUCOSE, CAPILLARY
GLUCOSE-CAPILLARY: 279 mg/dL — AB (ref 70–99)
GLUCOSE-CAPILLARY: 252 mg/dL — AB (ref 70–99)
GLUCOSE-CAPILLARY: 218 mg/dL — AB (ref 70–99)
GLUCOSE-CAPILLARY: 179 mg/dL — AB (ref 70–99)
GLUCOSE-CAPILLARY: 260 mg/dL — AB (ref 70–99)
GLUCOSE-CAPILLARY: 183 mg/dL — AB (ref 70–99)
Glucose-Capillary: 321 mg/dL — ABNORMAL HIGH (ref 70–99)
Glucose-Capillary: 296 mg/dL — ABNORMAL HIGH (ref 70–99)
Glucose-Capillary: 282 mg/dL — ABNORMAL HIGH (ref 70–99)
Glucose-Capillary: 183 mg/dL — ABNORMAL HIGH (ref 70–99)
Glucose-Capillary: 178 mg/dL — ABNORMAL HIGH (ref 70–99)
Glucose-Capillary: 209 mg/dL — ABNORMAL HIGH (ref 70–99)
Glucose-Capillary: 157 mg/dL — ABNORMAL HIGH (ref 70–99)
Glucose-Capillary: 148 mg/dL — ABNORMAL HIGH (ref 70–99)
Glucose-Capillary: 201 mg/dL — ABNORMAL HIGH (ref 70–99)
Glucose-Capillary: 152 mg/dL — ABNORMAL HIGH (ref 70–99)
Glucose-Capillary: 155 mg/dL — ABNORMAL HIGH (ref 70–99)
Glucose-Capillary: 132 mg/dL — ABNORMAL HIGH (ref 70–99)

## 2014-05-11 LAB — BASIC METABOLIC PANEL
Anion gap: 12 (ref 5–15)
BUN: 29 mg/dL — ABNORMAL HIGH (ref 6–23)
CALCIUM: 8.2 mg/dL — AB (ref 8.4–10.5)
CO2: 18 meq/L — AB (ref 19–32)
CREATININE: 1.13 mg/dL (ref 0.50–1.35)
Chloride: 109 mEq/L (ref 96–112)
GFR calc Af Amer: 73 mL/min — ABNORMAL LOW (ref >90)
GFR calc non Af Amer: 63 mL/min — ABNORMAL LOW (ref >90)
Glucose, Bld: 275 mg/dL — ABNORMAL HIGH (ref 70–99)
Potassium: 4.6 mEq/L (ref 3.7–5.3)
SODIUM: 139 meq/L (ref 137–147)

## 2014-05-11 LAB — HEMOGLOBIN AND HEMATOCRIT, BLOOD
HCT: 26.2 % — ABNORMAL LOW (ref 39.0–52.0)
HEMATOCRIT: 27.2 % — AB (ref 39.0–52.0)
HEMOGLOBIN: 8.9 g/dL — AB (ref 13.0–17.0)
HEMOGLOBIN: 9.1 g/dL — AB (ref 13.0–17.0)

## 2014-05-11 LAB — MRSA PCR SCREENING: MRSA by PCR: NEGATIVE

## 2014-05-11 LAB — PREPARE RBC (CROSSMATCH)

## 2014-05-11 LAB — PROCALCITONIN: Procalcitonin: 0.25 ng/mL

## 2014-05-11 LAB — ABO/RH: ABO/RH(D): O POS

## 2014-05-11 MED ORDER — SODIUM CHLORIDE 0.9 % IV SOLN
INTRAVENOUS | Status: DC
  Administered 2014-05-11: 02:00:00 via INTRAVENOUS

## 2014-05-11 MED ORDER — DEXTROSE-NACL 5-0.45 % IV SOLN
INTRAVENOUS | Status: DC
  Administered 2014-05-11 (×2): via INTRAVENOUS

## 2014-05-11 MED ORDER — SODIUM CHLORIDE 0.9 % IV SOLN
INTRAVENOUS | Status: DC
  Administered 2014-05-11: 2.6 [IU]/h via INTRAVENOUS
  Filled 2014-05-11 (×2): qty 2.5

## 2014-05-11 MED ORDER — DEXTROSE 50 % IV SOLN: 25.0000 mL | INTRAVENOUS | Status: DC | PRN

## 2014-05-11 MED ORDER — INSULIN REGULAR BOLUS VIA INFUSION
0.0000 [IU] | Freq: Three times a day (TID) | INTRAVENOUS | Status: DC
  Administered 2014-05-11: 3.8 [IU] via INTRAVENOUS
  Filled 2014-05-11: qty 10

## 2014-05-11 MED ORDER — FUROSEMIDE 10 MG/ML IJ SOLN
20.0000 mg | Freq: Once | INTRAMUSCULAR | Status: AC | PRN
Start: 2014-05-11 — End: 2014-05-11

## 2014-05-11 MED ORDER — VANCOMYCIN HCL 10 G IV SOLR
1250.0000 mg | Freq: Two times a day (BID) | INTRAVENOUS | Status: DC
  Administered 2014-05-11 – 2014-05-12 (×2): 1250 mg via INTRAVENOUS
  Filled 2014-05-11 (×3): qty 1250

## 2014-05-11 MED ORDER — SODIUM CHLORIDE 0.9 % IV SOLN: Freq: Once | INTRAVENOUS | Status: DC

## 2014-05-11 MED ORDER — FUROSEMIDE 10 MG/ML IJ SOLN: 20.0000 mg | Freq: Once | INTRAMUSCULAR | Status: AC | PRN

## 2014-05-11 MED ORDER — SODIUM CHLORIDE 0.9 % IV SOLN
500.0000 mg | Freq: Three times a day (TID) | INTRAVENOUS | Status: DC
  Administered 2014-05-11 – 2014-05-12 (×2): 500 mg via INTRAVENOUS
  Filled 2014-05-11 (×4): qty 500

## 2014-05-11 NOTE — Consult Note (Signed)
[facility] Consult: 8:25 AM 05/11/2014  LOS: 2 days    Referring Provider: Dr.Elgergawy  Primary Care Physician: [name], MD Primary Gastroenterologist: Dr. Olevia Perches.     Reason for Consultation: 2 issues: Acute painless hematochezia. Head of pancreas mass seen on CT scan  HPI: [name] is a 73 y.o. male. Obese, poorly controlled diabetic with OSA, CAD/remote PCI, diastolic heart failure. Remote SVT ablation. S/p lap chole, knee replacements. Recent plans for carpal tunnel surgery cancelled due to out of control blood sugars. Takes an 81 mg ASA and Prilosec 20 mg daily.   Patient was seen by his primary M.D. as well as his endocrinologist regarding the elevated blood sugars. . His primary M.D. started him on a course of antibiotics which sound like may have been clindamycin. This sounds like it was started empirically because his white blood cell count was elevated. She denies having had fevers or diaphoresis. After starting the antibiotic he began to experience dysphagia located in the region of the pharynx and burning in his esophagus. After a few to several days the antibiotic was discontinued and his symptoms resolved. At the same time his lisinopril was also discontinued because he was experiencing balance and gait problems without syncope. The lisinopril was restarted Tuesday of this week and she immediately had the same problems with balance and gait. He also had some chest pressure and tachycardia and it was for this reason that he came to the emergency room and was admitted Since the cancel surgery on November 6, he has stuck closer to his diabetic restrictions. He is lost 20 pounds since then. He had not lost weight prior to adjusting his diet. He has not had early satiety.  Patient has ruled out for MI. He was started on DVT  prevention Lovenox at admission. Yesterday at 6 PM he had the first of 2 large volume episodes of painless hematochezia. These bloody stools were mixed with stool regular stool. The last bit of a stool looked darker in color than the initial very bright red color. No nausea, no abdominal pain. Patient has never had similar bleeding or even minor bleeding per rectum. His last colonoscopy was in 2013 and this showed some moderate diverticulosis, recurrence of adenomatous colon polyps and internal hemorrhoids. Patient is not using any nonsteroidal anti-inflammatory medications. He doesn't drink significantly. There is no known history of liver disease. He's not having any other unusual bleeding or bruising tendencies.  During this current hospitalization he has been placed on empiric antibiotics for fever and leukocytosis. However source of these remains undetermined. Just prior to developing the GI bleed the patient had noncontrasted CT scan angio of chest, and plain, contrasted CT of abdomen and pelvis. This study demonstrated a mass in the pancreatic head in the region of the duodenal C-loop associated with localized lymphadenopathy. Liver parenchyma was not well evaluated due to what was considered an essentially noncontrast exam. After developing the bleed, his Lovenox was discontinued. E myoglobin has gone from 10.9 down to 9.1 yesterday and today it is 7.8. His white count which was 26.9 is 17.5 today. Platelets are in the mid 200s. Alkaline phosphatase, transaminases and total bilirubin are normal. His albumin is low at 2. At admission his BUN was 33 and his creatinine was 1.4. This morning, following those episodes of GI bleeding the renal function is 29 and 1.1 There are no coags on records since January 2014 when they were normal M.D. has ordered 2 units of packed  red blood cells to be transfused as of 8:30 this morning.    Past Medical History  Diagnosis Date  . Diabetes mellitus     . Hyperlipidemia   . Hypertension   . GERD (gastroesophageal reflux disease)   . Coronary artery disease     a. S/P prior PTCA in the 90's; b. 04/2007 Cath: LM nl, LAD Ca2+ prox, LCX nl, RI nl, OM nl, RCA 40-50p, 40d, EF 60%; b. 09/2012 Echo: EF 55-60%, mild LVH, nl wall motion, Gr 1 DD, mildly dil LA.  . Right ureteral stone   . SVT (supraventricular tachycardia)     a. s/p ablation per Dr. Lovena Le 10 -9yrs ago  . OSA on CPAP     a. cpap setting of 16  . Arthritis   . Bilateral kidney stones   . Shortness of breath dyspnea   . CKD (chronic kidney disease), stage III     Past Surgical History  Procedure Laterality Date  . Right ureteroscopic stone extraction  09-26-2010  . Circumcision and fulgeration of condyloma  06-22-2003  . Cardiac electrophysiology study and ablation  2002  . Transthoracic echocardiogram  03-03-2011    MODERATE LVH/ LVSF NORMAL / EF 60-65%/ MILDLY DILATED LEFT ATRIUMK  . Severeal ureteroscopic stone extractions    . Ureteroscopy  11/13/2011    Procedure: URETEROSCOPY; Surgeon: Claybon Jabs, MD; Location: [facility]; Service: Urology; Laterality: Right; RIGHT URETEROSCOPY WITH HOLMIUM LASER LIHTOTRIPSY DIGITAL URETEROSCOPE  . Coronary angioplasty  1994    [place] OF [facility] LAD  . Cardiac catheterization  04-27-2007    CAD WITH 30% NARROWING IN THE MID-LAD/ 50 % NARROWING PROXIMAL CIRCUMFLEX/ 40% NARROWING SECOND MARGINAL BRANCH/ 40-50% PROXIMAL RCA WITH DISTAL POSTERIOR NARROWING/ NORMAL LVF  . Cardiac catheterization  2000    NON-OBSTRUCTIVE CAD  . Laparoscopic cholecystectomy  03-26-2005  . Total knee arthroplasty  07/18/2012    Procedure: TOTAL KNEE ARTHROPLASTY; Surgeon: Johnn Hai, MD; Location: [facility]; Service: Orthopedics; Laterality: Right;    Prior to Admission medications   Medication Sig Start Date End  Date Taking? Authorizing Provider  aspirin EC 81 MG tablet Take 81 mg by mouth daily.   Yes Historical Provider, MD  atorvastatin (LIPITOR) 20 MG tablet Take 20 mg by mouth at bedtime.   Yes Historical Provider, MD  doxazosin (CARDURA) 2 MG tablet Take 2 mg by mouth daily before breakfast.    Yes Historical Provider, MD  furosemide (LASIX) 20 MG tablet Take 60 mg by mouth daily. 01/09/14  Yes Burnell Blanks, MD  insulin NPH-regular Human (NOVOLIN 70/30) (70-30) 100 UNIT/ML injection Inject 45-65 Units into the skin 2 (two) times daily with a meal. 70 units in am and 90 units in pm   Yes Historical Provider, MD  lisinopril (PRINIVIL,ZESTRIL) 20 MG tablet Take 20 mg by mouth daily.   Yes Historical Provider, MD  metFORMIN (GLUCOPHAGE-XR) 500 MG 24 hr tablet Take 500 mg by mouth daily with breakfast.  05/01/14  Yes Historical Provider, MD  metoprolol (LOPRESSOR) 100 MG tablet Take 50 mg by mouth 2 (two) times daily.    Yes Historical Provider, MD  Multiple Vitamins-Minerals (PRESERVISION AREDS PO) Take 1 tablet by mouth 2 (two) times daily.   Yes Historical Provider, MD  omeprazole (PRILOSEC) 20 MG capsule Take 40 mg by mouth daily.    Yes Historical Provider, MD  potassium chloride SA (K-DUR,KLOR-CON) 20 MEQ tablet Take 1 tablet (20 mEq total) by mouth 2 (two) times  daily. 01/09/14  Yes Burnell Blanks, MD    Scheduled Meds: . sodium chloride  Intravenous Once  . antiseptic oral rinse 7 mL Mouth Rinse BID  . atorvastatin 20 mg Oral QHS  . gabapentin 200 mg Oral QHS  . insulin regular 0-10 Units Intravenous TID WC  . [START ON 05/14/2014] pantoprazole (PROTONIX) IV 40 mg Intravenous Q12H  . sodium chloride 3 mL Intravenous Q12H   Infusions: . sodium chloride Stopped (05/11/14 0642)  . dextrose 5 % and 0.45% NaCl 50 mL/hr at 05/11/14 0642  . insulin (NOVOLIN-R)  infusion 8.6 Units/hr (05/11/14 0730)  . pantoprozole (PROTONIX) infusion 8 mg/hr (05/11/14 0335)   PRN Meds: acetaminophen **OR** acetaminophen, albuterol, alum & mag hydroxide-simeth, dextrose, furosemide, furosemide, guaiFENesin-dextromethorphan, ondansetron **OR** ondansetron (ZOFRAN) IV   Allergies as of 05/09/2014 - Review Complete 05/09/2014  Allergen Reaction Noted  . Clindamycin/lincomycin Other (See Comments) 05/09/2014  . Codeine Itching 02/06/2010  . Penicillins Rash 02/06/2010    Family History  Problem Relation Age of Onset  . Heart attack Mother   . Heart attack Brother   . Colon cancer Neg Hx   . Stomach cancer Neg Hx     History   Social History  . Marital Status: Married    Spouse Name: N/A    Number of Children: N/A  . Years of Education: N/A   Occupational History  . Not on file.   Social History Main Topics  . Smoking status: Former Smoker -- 1.00 packs/day for 15 years    Types: Cigarettes    Quit date: 06/15/1986  . Smokeless tobacco: Never Used  . Alcohol Use: Yes     Comment: occasional  . Drug Use: No  . Sexual Activity: Not on file   Other Topics Concern  . Not on file   Social History Narrative    REVIEW OF SYSTEMS: Constitutional: Feels unwell, not a lot of energy. Weight loss as above ENT: No nose bleeds Pulm: No new cough, no significant DOE. CV: + palpitations, no LE edema.  GU: No hematuria, no frequency. No blood in the urine. Endocrine: Patient says his sugars got under better control in the time since his canceled surgery earlier this month. No sweats, no excessive thirst, no excessive urination GI: As per history of present illness. Generally the patient's omeprazole controls any reflux symptoms completely. He doesn't have dysphagia other than what was described above which was short-lived Heme: Has never  needed to take iron supplements, has never been told that his hemoglobin/hematocrit were low.  Transfusions: Never required Neuro: No headaches, no peripheral tingling or numbness Derm: No itching, no rash or sores.  Endocrine: No sweats or chills. No polyuria or dysuria Immunization: Patient had his flu shot on 05/10/2014 Travel: None beyond local counties in last few months.    PHYSICAL EXAM: Vital signs in last 24 hours: Filed Vitals:   05/11/14 0716  BP:   Pulse:   Temp: 98.3 F (36.8 C)  Resp:    Wt Readings from Last 3 Encounters:  05/10/14 298 lb (135.172 kg)  01/22/14 311 lb (141.069 kg)  01/13/14 306 lb 6.4 oz (138.982 kg)    General: Obese but very tall gentleman who looks younger than stated age. He does not look well his coloring is pale and ashen Head: No swelling, no asymmetry, no signs of trauma  Eyes: No icterus, no conjunctival pallor Ears: No obvious hearing loss  Nose: No congestion or discharge Mouth: Many absent teeth. No  significant caries. No inflamed gums. Mucous membranes are moist and clear. No signs of Candida Neck: No mass, no JVD, no thyromegaly Lungs: Decreased globally, no adventitious sounds. No cough. No dyspnea Heart: RRR, no MRG. S1 S2 audible Abdomen: Soft, nontender, obese, non-protuberant, bowel sounds hypoactive..  Rectal: Deferred  Musc/Skeltl: Scars at the knees Sr. with surgery Extremities: No pedal or lower extremity edema  Neurologic: Slipped historian, alert and oriented 3. No tremor, limb strength not tested. Skin: No telangiectasia, no rash, no significant purpura or hematoma Tattoos: None Nodes: No cervical adenopathy  Psych: Pleasant, affect somewhat depressed. Engaged, relaxed  Intake/Output from previous day: 11/26 0701 - 11/27 0700 In: 882.6 [P.O.:220; I.V.:562.6; IV Piggyback:100] Out: 1226 [Urine:1226] Intake/Output this shift:    LAB RESULTS:  Recent Labs  (last 2 labs)      Recent Labs  05/09/14 1300 05/09/14 2037 05/10/14 2353 05/11/14 0620  WBC 21.4* 17.7* --  17.5*  HGB 10.9* 10.5* 9.1* 7.8*  HCT 32.0* 31.1* 27.2* 23.5*  PLT 250 254 --  234     BMET  Recent Labs    Lab Results  Component Value Date   NA 139 05/11/2014   NA 135* 05/09/2014   NA 135* 05/09/2014   K 4.6 05/11/2014   K 4.6 05/09/2014   K 4.0 05/09/2014   CL 109 05/11/2014   CL 105 05/09/2014   CL 99 05/09/2014   CO2 18* 05/11/2014   CO2 16* 05/09/2014   CO2 18* 05/09/2014   GLUCOSE 275* 05/11/2014   GLUCOSE 274* 05/09/2014   GLUCOSE 38* 05/09/2014   BUN 29* 05/11/2014   BUN 30* 05/09/2014   BUN 33* 05/09/2014   CREATININE 1.13 05/11/2014   CREATININE 1.27 05/09/2014   CREATININE 1.22 05/09/2014   CALCIUM 8.2* 05/11/2014   CALCIUM 8.0* 05/09/2014   CALCIUM 9.2 05/09/2014     LFT  Recent Labs (last 2 labs)      Recent Labs  05/09/14 0755 05/09/14 2037  PROT 6.9 5.6*  ALBUMIN 2.7* 2.0*  AST 18 12  ALT 15 12  ALKPHOS 63 59  BILITOT 0.7 0.5     PT/INR  Recent Labs    Lab Results  Component Value Date   INR 1.03 07/08/2012   INR 0.9 RATIO 04/26/2007     Hepatitis Panel  Recent Labs (last 2 labs)     No results for input(s): HEPBSAG, HCVAB, HEPAIGM, HEPBIGM in the last 72 hours.   C-Diff  Recent Labs    No components found for: CDIFF   Lipase   Labs (Brief)    No results found for: LIPASE    Drugs of Abuse   Labs (Brief)    No results found for: LABOPIA, COCAINSCRNUR, LABBENZ, AMPHETMU, THCU, LABBARB     RADIOLOGY STUDIES:  Imaging Results (Last 48 hours)    Ct Angio Chest Pe W/cm &/or Wo Cm  05/10/2014 CLINICAL DATA: Leukocytosis, fever of unknown origin, short of breath, chest pain and dizziness. EXAM: CT ANGIOGRAPHY CHEST CT ABDOMEN AND PELVIS WITH  CONTRAST TECHNIQUE: Multidetector CT imaging of the chest was performed using the standard protocol during bolus administration of intravenous contrast. Multiplanar CT image reconstructions and MIPs were obtained to evaluate the vascular anatomy. Multidetector CT imaging of the abdomen and pelvis was performed using the standard protocol during bolus administration of intravenous contrast. CONTRAST: 67mL OMNIPAQUE IOHEXOL 350 MG/ML SOLN COMPARISON: 80 mL Omnipaque 350 FINDINGS: CTA CHEST FINDINGS No filling defects within the pulmonary arteries to suggest  acute pulmonary embolism. No acute findings aorta great vessels. No pericardial fluid. Esophagus is normal. Review of the lung parenchyma demonstrates no suspicious pulmonary nodules. No airspace disease. No pleural fluid. CT ABDOMEN and PELVIS FINDINGS Abdominal portion was scanned at a delayed time . Therefore it is essentially a noncontrast exam. Hepatobiliary: No focal hepatic lesion identified on this noncontrast exam. Post cholecystectomy. Pancreas: There is an mass at the level of the pancreatic head measuring 5.2 x 3.1 x 6.1 cm. This mass is along the medial border of the second portion the duodenum and compressive the lumen of the duodenum duodenum. There is enlarged lymph node adjacent to the mass measuring 19 mm. No pancreatic duct dilatation. There is no biliary duct dilatation. Spleen: Normal spleen appear Adrenals/Urinary Tract: Adrenal glands are normal. Delayed imaging of the kidneys demonstrate no obstruction. Ureters and bladder normal. Stomach/Bowel: Stomach, small bowel and appendix are normal. The colon rectosigmoid colon are normal. There are diverticula of the sigmoid colon acute inflammation. Mass in the C-loop of the duodenum described in the pancreatic section. Vascular/Lymphatic: There is scattered calcification of the abdominal aorta. No retroperitoneal periportal lymphadenopathy. No mesenteric or peritoneal metastasis.  Reproductive: Prostate gland and bladder normal. No pelvic lymphadenopathy. Other: No free fluid in the abdomen pelvis. No free air Musculoskeletal: No aggressive osseous lesion. Review of the MIP images confirms the above findings. IMPRESSION: Chest Impression: No evidence of acute pulmonary embolism. Abdomen / Pelvis Impression: 1. Mass adjacent to pancreatic head and along the C-loop of the duodenum measuring 6 cm. No pancreatic duct dilatation or biliary duct dilatation. Differential includes pancreatic neoplasm, gastrointestinal stromal tumor, or lymphoma. Recommend endoscopic ultrasound with biopsy of this lesion. 2. Local lymphadenopathy with enlarged peripancreatic lymph node. 3. Liver parenchyma is poorly evaluated on this essentially noncontrast exam. Findings conveyed toDAWOOD ELGERGAWY on 05/10/2014 at19:17. Electronically Signed By: Suzy Bouchard M.D. On: 05/10/2014 19:18   Ct Abdomen Pelvis W Contrast  05/10/2014 CLINICAL DATA: Leukocytosis, fever of unknown origin, short of breath, chest pain and dizziness. EXAM: CT ANGIOGRAPHY CHEST CT ABDOMEN AND PELVIS WITH CONTRAST TECHNIQUE: Multidetector CT imaging of the chest was performed using the standard protocol during bolus administration of intravenous contrast. Multiplanar CT image reconstructions and MIPs were obtained to evaluate the vascular anatomy. Multidetector CT imaging of the abdomen and pelvis was performed using the standard protocol during bolus administration of intravenous contrast. CONTRAST: 54mL OMNIPAQUE IOHEXOL 350 MG/ML SOLN COMPARISON: 80 mL Omnipaque 350 FINDINGS: CTA CHEST FINDINGS No filling defects within the pulmonary arteries to suggest acute pulmonary embolism. No acute findings aorta great vessels. No pericardial fluid. Esophagus is normal. Review of the lung parenchyma demonstrates no suspicious pulmonary nodules. No airspace disease. No pleural fluid. CT ABDOMEN and PELVIS FINDINGS  Abdominal portion was scanned at a delayed time . Therefore it is essentially a noncontrast exam. Hepatobiliary: No focal hepatic lesion identified on this noncontrast exam. Post cholecystectomy. Pancreas: There is an mass at the level of the pancreatic head measuring 5.2 x 3.1 x 6.1 cm. This mass is along the medial border of the second portion the duodenum and compressive the lumen of the duodenum duodenum. There is enlarged lymph node adjacent to the mass measuring 19 mm. No pancreatic duct dilatation. There is no biliary duct dilatation. Spleen: Normal spleen appear Adrenals/Urinary Tract: Adrenal glands are normal. Delayed imaging of the kidneys demonstrate no obstruction. Ureters and bladder normal. Stomach/Bowel: Stomach, small bowel and appendix are normal. The colon rectosigmoid colon are normal. There are  diverticula of the sigmoid colon acute inflammation. Mass in the C-loop of the duodenum described in the pancreatic section. Vascular/Lymphatic: There is scattered calcification of the abdominal aorta. No retroperitoneal periportal lymphadenopathy. No mesenteric or peritoneal metastasis. Reproductive: Prostate gland and bladder normal. No pelvic lymphadenopathy. Other: No free fluid in the abdomen pelvis. No free air Musculoskeletal: No aggressive osseous lesion. Review of the MIP images confirms the above findings. IMPRESSION: Chest Impression: No evidence of acute pulmonary embolism. Abdomen / Pelvis Impression: 1. Mass adjacent to pancreatic head and along the C-loop of the duodenum measuring 6 cm. No pancreatic duct dilatation or biliary duct dilatation. Differential includes pancreatic neoplasm, gastrointestinal stromal tumor, or lymphoma. Recommend endoscopic ultrasound with biopsy of this lesion. 2. Local lymphadenopathy with enlarged peripancreatic lymph node. 3. Liver parenchyma is poorly evaluated on this essentially noncontrast exam. Findings conveyed toDAWOOD ELGERGAWY on  05/10/2014 at19:17. Electronically Signed By: Suzy Bouchard M.D. On: 05/10/2014 19:18     ENDOSCOPIC STUDIES: 04/2012  INDICATIONS:patient's personal history of adenomatous colon polyps and aden. polyps in 2003 and 2007. ENDOSCOPIC IMPRESSION: 1. Six sessile polyps ranging between 3-52mm in size were found throughout the entire examined colon; polypectomy was performed with cold forceps and with a cold snare 2. Mild diverticulosis was noted Pathology: - TUBULAR ADENOMAS (MULTIPLE FRAGMENTS). - HIGH GRADE DYSPLASIA IS NOT IDENTIFIED.  IMPRESSION:   * Bloody BM. Without abdominal pain. Highest on the differential is a diverticular bleed. Unable to rule out upper GI bleed but his BUN is actually down. He takes full dose PPI daily. Thus the suspicion for upper GI bleed is less. Lovenox discontinued.  He has documented diverticulosis as well as history of adenomatous colon polyp and is up-to-date on colonoscopy as of 11 2013  * Mass at head of pancreas with associated lymphadenopathy. This needs evaluation, probably with endoscopic ultrasound/ERCP.  * OSA. On CPAP at home.   * Leukocytosis/fever. To 17,000/101.2. Empiric vanco and cefipime in place. CT unrevealing as to source.   * IDDM.  * Compensated diastolic heart failure.   * Chest pain, resolved. Troponins negative.   * ARF, improved with hydration.  GI Attending Note   Chart was reviewed and patient was examined. X-rays and lab were reviewed.    Patient has had an acute GI bleed.  Although clinically it appears like a lower GI bleed I am concerned about the mass adjacent to the duodenum which could be a duodenal or pancreatic mass.  This could erode into the duodenum and cause bleeding.  Currently, I will schedule him for upper endoscopy in the a.m with Dr. Benson Norway.  No mucosal abdomen abnormalities are seen he will require EUS for further herniation of the mass.Sandy Salaam. Deatra Ina, M.D.,  [facility] Cell [phone]

## 2014-05-11 NOTE — Progress Notes (Signed)
Patient has home CPAP and states that he will put on himself when he is ready to sleep.  Informed patient and RN to contact RT if needed.

## 2014-05-11 NOTE — Progress Notes (Addendum)
ANTIBIOTIC CONSULT NOTE - INITIAL  Pharmacy Consult:  Vancomycin / Primaxin Indication:  Sepsis  Allergies  Allergen Reactions  . Clindamycin/Lincomycin Other (See Comments)    dysphagia  . Codeine Itching  . Penicillins Rash    Patient Measurements: Height: 6\' 5"  (195.6 cm) Weight: 298 lb (135.172 kg) (scale b) IBW/kg (Calculated) : 89.1  Vital Signs: Temp: 98.4 F (36.9 C) (11/27 1600) Temp Source: Oral (11/27 1600) BP: 135/78 mmHg (11/27 1510) Pulse Rate: 96 (11/27 1700) Intake/Output from previous day: 11/26 0701 - 11/27 0700 In: 932.2 [P.O.:220; I.V.:612.2; IV Piggyback:100] Out: 1226 [Urine:1226]  Labs:  Recent Labs  05/09/14 1300 05/09/14 2037  05/11/14 0620 05/11/14 0906 05/11/14 1757  WBC 21.4* 17.7*  --  17.5*  --  21.6*  HGB 10.9* 10.5*  < > 7.8* 8.9* 10.1*  PLT 250 254  --  234  --  236  CREATININE 1.22 1.27  --  1.13  --   --   < > = values in this interval not displayed. Estimated Creatinine Clearance: 89.8 mL/min (by C-G formula based on Cr of 1.13). No results for input(s): VANCOTROUGH, VANCOPEAK, VANCORANDOM, GENTTROUGH, GENTPEAK, GENTRANDOM, TOBRATROUGH, TOBRAPEAK, TOBRARND, AMIKACINPEAK, AMIKACINTROU, AMIKACIN in the last 72 hours.   Microbiology: Recent Results (from the past 720 hour(s))  Blood Culture (routine x 2)     Status: None (Preliminary result)   Collection Time: 05/09/14  9:10 AM  Result Value Ref Range Status   Specimen Description BLOOD LEFT FOREARM  Final   Special Requests BOTTLES DRAWN AEROBIC AND ANAEROBIC 5CC  Final   Culture  Setup Time   Final    05/09/2014 18:03 Performed at [facility]    Culture   Final           BLOOD CULTURE RECEIVED NO GROWTH TO DATE CULTURE WILL BE HELD FOR 5 DAYS BEFORE ISSUING A FINAL NEGATIVE REPORT Performed at [facility]    Report Status PENDING  Incomplete  Urine culture     Status: None   Collection Time: 05/09/14  9:55 AM  Result Value Ref Range Status   Specimen  Description URINE, CLEAN CATCH  Final   Special Requests NONE  Final   Culture  Setup Time   Final    05/09/2014 18:23 Performed at [place] Performed at [facility]   Final   Culture NO GROWTH Performed at [facility]   Final   Report Status 05/10/2014 FINAL  Final  Blood culture (routine x 2)     Status: None (Preliminary result)   Collection Time: 05/09/14 11:40 AM  Result Value Ref Range Status   Specimen Description BLOOD LEFT HAND  Final   Special Requests BOTTLES DRAWN AEROBIC ONLY 3CCS  Final   Culture  Setup Time   Final    05/09/2014 18:03 Performed at [facility]    Culture   Final           BLOOD CULTURE RECEIVED NO GROWTH TO DATE CULTURE WILL BE HELD FOR 5 DAYS BEFORE ISSUING A FINAL NEGATIVE REPORT Performed at [facility]    Report Status PENDING  Incomplete  MRSA PCR Screening     Status: None   Collection Time: 05/11/14  6:48 AM  Result Value Ref Range Status   MRSA by PCR NEGATIVE NEGATIVE Final    Comment:        The GeneXpert MRSA Assay (FDA approved for  NASAL specimens only), is one component of a comprehensive MRSA colonization surveillance program. It is not intended to diagnose MRSA infection nor to guide or monitor treatment for MRSA infections.     Medical History: Past Medical History  Diagnosis Date  . Diabetes mellitus   . Hyperlipidemia   . Hypertension   . GERD (gastroesophageal reflux disease)   . Coronary artery disease     a. S/P prior PTCA in the 90's;  b. 04/2007 Cath: LM nl, LAD Ca2+ prox, LCX nl, RI nl, OM nl, RCA 40-50p, 40d, EF 60%;  b. 09/2012 Echo: EF 55-60%, mild LVH, nl wall motion, Gr 1 DD, mildly dil LA.  . Right ureteral stone   . SVT (supraventricular tachycardia)     a. s/p ablation per Dr. Lovena Le 10 -77yrs ago  . OSA on CPAP     a. cpap setting of 16  . Arthritis   . Bilateral kidney stones   . Shortness of breath dyspnea   . CKD  (chronic kidney disease), stage III      Assessment: 61 YOM previously on vancomycin and cefepime for rule out sepsis.  Antibiotics were held as thought symptoms were from pancreatic mass.  Now with fever and leukocytosis and patient to resume vancomycin and start Primaxin for sepsis.  Labs reviewed.   Goal of Therapy:  Vancomycin trough level 15-20 mcg/ml   Plan:  - Vanc 1250mg  IV Q12H - Primaxin 500mg  IV Q8H - Monitor renal fxn, clinical progress, vanc trough as indicated     Thuy D. Mina Marble, PharmD, BCPS Pager:  [phone] 05/11/2014, 7:20 PM  Addendum  Changing primaxin to 500mg  IV q6  Onnie Boer, PharmD Pager: [phone] 05/12/2014 1:08 PM

## 2014-05-11 NOTE — Progress Notes (Signed)
Patient and patient's family told GI MD would round on patient today.  Patient and family very anxious they have not seen GI MD.  On-call GI MD notified has no new information to offer and family made aware.  Eleanor, Ardeth Sportsman

## 2014-05-11 NOTE — Consult Note (Signed)
[facility] Consult: 8:25 AM 05/11/2014  LOS: 2 days    Referring Provider: Dr.Elgergawy  Primary Care Physician:  [name], MD Primary Gastroenterologist:  Dr. Olevia Perches.      Reason for Consultation:  2 issues:  Acute painless hematochezia. Head of pancreas mass seen on CT scan   HPI: [name] is a 73 y.o. male.  Obese, poorly controlled diabetic with OSA, CAD/remote PCI, diastolic heart failure.  Remote SVT ablation.  S/p lap chole, knee replacements.  Recent plans for carpal tunnel surgery cancelled due to out of control blood sugars. Takes an 81 mg ASA and Prilosec 20 mg daily.   Patient was seen by his primary M.D. as well as his endocrinologist regarding the elevated blood sugars. . His primary M.D. started him on a course of antibiotics which sound like may have been clindamycin. This sounds like it was started empirically because his white blood cell count was elevated. She denies having had fevers or diaphoresis. After starting the antibiotic he began to experience dysphagia located in the region of the pharynx and burning in his esophagus. After a few to several days the antibiotic was discontinued and his symptoms resolved. At the same time his lisinopril was also discontinued because he was experiencing balance and gait problems without syncope.  The lisinopril was restarted Tuesday of this week and she immediately had the same problems with balance and gait. He also had some chest pressure and tachycardia and it was for this reason that he came to the emergency room and was admitted Since the cancel surgery on November 6, he has stuck closer to his diabetic restrictions. He is lost 20 pounds since then.  He had not lost weight prior to adjusting his diet. He has not had early satiety.  Patient  has ruled out for MI. He was started on DVT prevention Lovenox at admission. Yesterday at 6 PM he had the first of 2 large volume episodes of painless hematochezia. These bloody stools were mixed with stool regular stool. The last bit of a stool looked darker in color than the initial very bright red color. No nausea, no abdominal pain.  Patient has never had similar bleeding or even minor bleeding per rectum. His last colonoscopy was in 2013 and this showed some moderate diverticulosis, recurrence of adenomatous colon polyps and internal hemorrhoids. Patient is not using any nonsteroidal anti-inflammatory medications. He doesn't drink significantly. There is no known history of liver disease. He's not having any other unusual bleeding or bruising tendencies.  During this current hospitalization he has been placed on empiric antibiotics for fever and leukocytosis. However source of these remains undetermined. Just prior to developing the GI bleed the patient had noncontrasted CT scan angio of chest, and plain, contrasted CT of abdomen and pelvis. This study demonstrated a mass in the pancreatic head in the region of the duodenal C-loop associated with localized lymphadenopathy.  Liver parenchyma was not well evaluated due to what was considered an essentially noncontrast exam. After developing the bleed, his Lovenox was discontinued. E myoglobin has  gone from 10.9 down to 9.1 yesterday and today it is 7.8. His white count which was 26.9 is 17.5 today. Platelets are in the mid 200s.  Alkaline phosphatase, transaminases and total bilirubin are normal. His albumin is low at 2. At admission his BUN was 33 and his creatinine was 1.4. This morning, following those episodes of GI bleeding the renal function is 29 and 1.1 There are no coags on records since January 2014 when they were normal M.D. has ordered 2 units of packed red blood cells to be transfused as of 8:30 this morning.    Past Medical History    Diagnosis Date  . Diabetes mellitus   . Hyperlipidemia   . Hypertension   . GERD (gastroesophageal reflux disease)   . Coronary artery disease     a. S/P prior PTCA in the 90's;  b. 04/2007 Cath: LM nl, LAD Ca2+ prox, LCX nl, RI nl, OM nl, RCA 40-50p, 40d, EF 60%;  b. 09/2012 Echo: EF 55-60%, mild LVH, nl wall motion, Gr 1 DD, mildly dil LA.  . Right ureteral stone   . SVT (supraventricular tachycardia)     a. s/p ablation per Dr. Lovena Le 10 -81yrs ago  . OSA on CPAP     a. cpap setting of 16  . Arthritis   . Bilateral kidney stones   . Shortness of breath dyspnea   . CKD (chronic kidney disease), stage III     Past Surgical History  Procedure Laterality Date  . Right ureteroscopic stone extraction  09-26-2010  . Circumcision and fulgeration of condyloma  06-22-2003  . Cardiac electrophysiology study and ablation  2002  . Transthoracic echocardiogram  03-03-2011    MODERATE LVH/ LVSF NORMAL / EF 60-65%/ MILDLY DILATED LEFT ATRIUMK  . Severeal ureteroscopic stone extractions    . Ureteroscopy  11/13/2011    Procedure: URETEROSCOPY;  Surgeon: Claybon Jabs, MD;  Location: [facility];  Service: Urology;  Laterality: Right;  RIGHT URETEROSCOPY WITH HOLMIUM LASER LIHTOTRIPSY DIGITAL URETEROSCOPE  . Coronary angioplasty  1994    [place] OF THE LAD  . Cardiac catheterization  04-27-2007    CAD WITH 30% NARROWING IN THE MID-LAD/ 50 % NARROWING PROXIMAL CIRCUMFLEX/ 40% NARROWING SECOND MARGINAL BRANCH/ 40-50% PROXIMAL RCA WITH DISTAL POSTERIOR NARROWING/ NORMAL LVF  . Cardiac catheterization  2000    NON-OBSTRUCTIVE CAD  . Laparoscopic cholecystectomy  03-26-2005  . Total knee arthroplasty  07/18/2012    Procedure: TOTAL KNEE ARTHROPLASTY;  Surgeon: Johnn Hai, MD;  Location: [facility];  Service: Orthopedics;  Laterality: Right;    Prior to Admission medications   Medication Sig Start Date End Date Taking? Authorizing Provider  aspirin EC 81 MG tablet Take 81 mg by mouth  daily.   Yes Historical Provider, MD  atorvastatin (LIPITOR) 20 MG tablet Take 20 mg by mouth at bedtime.   Yes Historical Provider, MD  doxazosin (CARDURA) 2 MG tablet Take 2 mg by mouth daily before breakfast.    Yes Historical Provider, MD  furosemide (LASIX) 20 MG tablet Take 60 mg by mouth daily. 01/09/14  Yes Burnell Blanks, MD  insulin NPH-regular Human (NOVOLIN 70/30) (70-30) 100 UNIT/ML injection Inject 45-65 Units into the skin 2 (two) times daily with a meal. 70 units in am and 90 units in pm   Yes Historical Provider, MD  lisinopril (PRINIVIL,ZESTRIL) 20 MG tablet Take 20 mg by mouth daily.   Yes Historical Provider, MD  metFORMIN (GLUCOPHAGE-XR) 500 MG  24 hr tablet Take 500 mg by mouth daily with breakfast.  05/01/14  Yes Historical Provider, MD  metoprolol (LOPRESSOR) 100 MG tablet Take 50 mg by mouth 2 (two) times daily.    Yes Historical Provider, MD  Multiple Vitamins-Minerals (PRESERVISION AREDS PO) Take 1 tablet by mouth 2 (two) times daily.   Yes Historical Provider, MD  omeprazole (PRILOSEC) 20 MG capsule Take 40 mg by mouth daily.    Yes Historical Provider, MD  potassium chloride SA (K-DUR,KLOR-CON) 20 MEQ tablet Take 1 tablet (20 mEq total) by mouth 2 (two) times daily. 01/09/14  Yes Burnell Blanks, MD    Scheduled Meds: . sodium chloride   Intravenous Once  . antiseptic oral rinse  7 mL Mouth Rinse BID  . atorvastatin  20 mg Oral QHS  . gabapentin  200 mg Oral QHS  . insulin regular  0-10 Units Intravenous TID WC  . [START ON 05/14/2014] pantoprazole (PROTONIX) IV  40 mg Intravenous Q12H  . sodium chloride  3 mL Intravenous Q12H   Infusions: . sodium chloride Stopped (05/11/14 0642)  . dextrose 5 % and 0.45% NaCl 50 mL/hr at 05/11/14 0642  . insulin (NOVOLIN-R) infusion 8.6 Units/hr (05/11/14 0730)  . pantoprozole (PROTONIX) infusion 8 mg/hr (05/11/14 0335)   PRN Meds: acetaminophen **OR** acetaminophen, albuterol, alum & mag hydroxide-simeth,  dextrose, furosemide, furosemide, guaiFENesin-dextromethorphan, ondansetron **OR** ondansetron (ZOFRAN) IV   Allergies as of 05/09/2014 - Review Complete 05/09/2014  Allergen Reaction Noted  . Clindamycin/lincomycin Other (See Comments) 05/09/2014  . Codeine Itching 02/06/2010  . Penicillins Rash 02/06/2010    Family History  Problem Relation Age of Onset  . Heart attack Mother   . Heart attack Brother   . Colon cancer Neg Hx   . Stomach cancer Neg Hx     History   Social History  . Marital Status: Married    Spouse Name: N/A    Number of Children: N/A  . Years of Education: N/A   Occupational History  . Not on file.   Social History Main Topics  . Smoking status: Former Smoker -- 1.00 packs/day for 15 years    Types: Cigarettes    Quit date: 06/15/1986  . Smokeless tobacco: Never Used  . Alcohol Use: Yes     Comment: occasional  . Drug Use: No  . Sexual Activity: Not on file   Other Topics Concern  . Not on file   Social History Narrative    REVIEW OF SYSTEMS: Constitutional:  Feels unwell, not a lot of energy. Weight loss as above ENT:  No nose bleeds Pulm:  No new cough, no significant DOE. CV:  + palpitations, no LE edema.  GU:  No hematuria, no frequency.  No blood in the urine. Endocrine:  Patient says his sugars got under better control in the time since his canceled surgery earlier this month. No sweats, no excessive thirst, no excessive urination GI:  As per history of present illness. Generally the patient's omeprazole controls any reflux symptoms completely. He doesn't have dysphagia other than what was described above which was short-lived Heme:  Has never needed to take iron supplements, has never been told that his hemoglobin/hematocrit were low.   Transfusions:  Never required Neuro:  No headaches, no peripheral tingling or numbness Derm:  No itching, no rash or sores.  Endocrine:  No sweats or chills.  No polyuria or dysuria Immunization:   Patient had his flu shot on 05/10/2014 Travel:  None beyond local  counties in last few months.    PHYSICAL EXAM: Vital signs in last 24 hours: Filed Vitals:   05/11/14 0716  BP:   Pulse:   Temp: 98.3 F (36.8 C)  Resp:    Wt Readings from Last 3 Encounters:  05/10/14 298 lb (135.172 kg)  01/22/14 311 lb (141.069 kg)  01/13/14 306 lb 6.4 oz (138.982 kg)    General: Obese but very tall gentleman who looks younger than stated age. He does not look well his coloring is pale and ashen Head:  No swelling, no asymmetry, no signs of trauma  Eyes:  No icterus, no conjunctival pallor Ears:  No obvious hearing loss  Nose:  No congestion or discharge Mouth:  Many absent teeth. No significant caries. No inflamed gums. Mucous membranes are moist and clear. No signs of Candida Neck:  No mass, no JVD, no thyromegaly Lungs:  Decreased globally, no adventitious sounds. No cough. No dyspnea Heart: RRR, no MRG. S1 S2 audible Abdomen:  Soft, nontender, obese, non-protuberant, bowel sounds hypoactive..   Rectal: Deferred   Musc/Skeltl: Scars at the knees Sr. with surgery Extremities:  No pedal or lower extremity edema  Neurologic:  Slipped historian, alert and oriented 3. No tremor, limb strength not tested. Skin:  No telangiectasia, no rash, no significant purpura or hematoma Tattoos:  None Nodes:  No cervical adenopathy   Psych:  Pleasant, affect somewhat depressed. Engaged, relaxed  Intake/Output from previous day: 11/26 0701 - 11/27 0700 In: 882.6 [P.O.:220; I.V.:562.6; IV Piggyback:100] Out: 1226 [Urine:1226] Intake/Output this shift:    LAB RESULTS:  Recent Labs  05/09/14 1300 05/09/14 2037 05/10/14 2353 05/11/14 0620  WBC 21.4* 17.7*  --  17.5*  HGB 10.9* 10.5* 9.1* 7.8*  HCT 32.0* 31.1* 27.2* 23.5*  PLT 250 254  --  234   BMET Lab Results  Component Value Date   NA 139 05/11/2014   NA 135* 05/09/2014   NA 135* 05/09/2014   K 4.6 05/11/2014   K 4.6 05/09/2014   K  4.0 05/09/2014   CL 109 05/11/2014   CL 105 05/09/2014   CL 99 05/09/2014   CO2 18* 05/11/2014   CO2 16* 05/09/2014   CO2 18* 05/09/2014   GLUCOSE 275* 05/11/2014   GLUCOSE 274* 05/09/2014   GLUCOSE 38* 05/09/2014   BUN 29* 05/11/2014   BUN 30* 05/09/2014   BUN 33* 05/09/2014   CREATININE 1.13 05/11/2014   CREATININE 1.27 05/09/2014   CREATININE 1.22 05/09/2014   CALCIUM 8.2* 05/11/2014   CALCIUM 8.0* 05/09/2014   CALCIUM 9.2 05/09/2014   LFT  Recent Labs  05/09/14 0755 05/09/14 2037  PROT 6.9 5.6*  ALBUMIN 2.7* 2.0*  AST 18 12  ALT 15 12  ALKPHOS 63 59  BILITOT 0.7 0.5   PT/INR Lab Results  Component Value Date   INR 1.03 07/08/2012   INR 0.9 RATIO 04/26/2007   Hepatitis Panel No results for input(s): HEPBSAG, HCVAB, HEPAIGM, HEPBIGM in the last 72 hours. C-Diff No components found for: CDIFF Lipase  No results found for: LIPASE  Drugs of Abuse  No results found for: LABOPIA, COCAINSCRNUR, LABBENZ, AMPHETMU, THCU, LABBARB   RADIOLOGY STUDIES: Ct Angio Chest Pe W/cm &/or Wo Cm  05/10/2014   CLINICAL DATA:  Leukocytosis, fever of unknown origin, short of breath, chest pain and dizziness.  EXAM: CT ANGIOGRAPHY CHEST  CT ABDOMEN AND PELVIS WITH CONTRAST  TECHNIQUE: Multidetector CT imaging of the chest was performed using the standard protocol during bolus  administration of intravenous contrast. Multiplanar CT image reconstructions and MIPs were obtained to evaluate the vascular anatomy. Multidetector CT imaging of the abdomen and pelvis was performed using the standard protocol during bolus administration of intravenous contrast.  CONTRAST:  69mL OMNIPAQUE IOHEXOL 350 MG/ML SOLN  COMPARISON:  80 mL Omnipaque 350  FINDINGS: CTA CHEST FINDINGS  No filling defects within the pulmonary arteries to suggest acute pulmonary embolism. No acute findings aorta great vessels. No pericardial fluid. Esophagus is normal.  Review of the lung parenchyma demonstrates no suspicious  pulmonary nodules. No airspace disease. No pleural fluid.  CT ABDOMEN and PELVIS FINDINGS  Abdominal portion was scanned at a delayed time . Therefore it is essentially a noncontrast exam.  Hepatobiliary: No focal hepatic lesion identified on this noncontrast exam. Post cholecystectomy.  Pancreas: There is an mass at the level of the pancreatic head measuring 5.2 x 3.1 x 6.1 cm. This mass is along the medial border of the second portion the duodenum and compressive the lumen of the duodenum duodenum. There is enlarged lymph node adjacent to the mass measuring 19 mm. No pancreatic duct dilatation. There is no biliary duct dilatation.  Spleen: Normal spleen appear  Adrenals/Urinary Tract: Adrenal glands are normal. Delayed imaging of the kidneys demonstrate no obstruction. Ureters and bladder normal.  Stomach/Bowel: Stomach, small bowel and appendix are normal. The colon rectosigmoid colon are normal. There are diverticula of the sigmoid colon acute inflammation.  Mass in the C-loop of the duodenum described in the pancreatic section.  Vascular/Lymphatic: There is scattered calcification of the abdominal aorta. No retroperitoneal periportal lymphadenopathy. No mesenteric or peritoneal metastasis.  Reproductive: Prostate gland and bladder normal. No pelvic lymphadenopathy.  Other: No free fluid in the abdomen pelvis.  No free air  Musculoskeletal: No aggressive osseous lesion.  Review of the MIP images confirms the above findings.  IMPRESSION: Chest Impression:  No evidence of acute pulmonary embolism.  Abdomen / Pelvis Impression:  1. Mass adjacent to pancreatic head and along the C-loop of the duodenum measuring 6 cm. No pancreatic duct dilatation or biliary duct dilatation. Differential includes pancreatic neoplasm, gastrointestinal stromal tumor, or lymphoma. Recommend endoscopic ultrasound with biopsy of this lesion.  2. Local lymphadenopathy with enlarged peripancreatic lymph node.  3. Liver parenchyma is poorly  evaluated on this essentially noncontrast exam.  Findings conveyed toDAWOOD ELGERGAWY on 05/10/2014  at19:17.   Electronically Signed   By: Suzy Bouchard M.D.   On: 05/10/2014 19:18   Ct Abdomen Pelvis W Contrast  05/10/2014   CLINICAL DATA:  Leukocytosis, fever of unknown origin, short of breath, chest pain and dizziness.  EXAM: CT ANGIOGRAPHY CHEST  CT ABDOMEN AND PELVIS WITH CONTRAST  TECHNIQUE: Multidetector CT imaging of the chest was performed using the standard protocol during bolus administration of intravenous contrast. Multiplanar CT image reconstructions and MIPs were obtained to evaluate the vascular anatomy. Multidetector CT imaging of the abdomen and pelvis was performed using the standard protocol during bolus administration of intravenous contrast.  CONTRAST:  51mL OMNIPAQUE IOHEXOL 350 MG/ML SOLN  COMPARISON:  80 mL Omnipaque 350  FINDINGS: CTA CHEST FINDINGS  No filling defects within the pulmonary arteries to suggest acute pulmonary embolism. No acute findings aorta great vessels. No pericardial fluid. Esophagus is normal.  Review of the lung parenchyma demonstrates no suspicious pulmonary nodules. No airspace disease. No pleural fluid.  CT ABDOMEN and PELVIS FINDINGS  Abdominal portion was scanned at a delayed time . Therefore it is essentially a noncontrast  exam.  Hepatobiliary: No focal hepatic lesion identified on this noncontrast exam. Post cholecystectomy.  Pancreas: There is an mass at the level of the pancreatic head measuring 5.2 x 3.1 x 6.1 cm. This mass is along the medial border of the second portion the duodenum and compressive the lumen of the duodenum duodenum. There is enlarged lymph node adjacent to the mass measuring 19 mm. No pancreatic duct dilatation. There is no biliary duct dilatation.  Spleen: Normal spleen appear  Adrenals/Urinary Tract: Adrenal glands are normal. Delayed imaging of the kidneys demonstrate no obstruction. Ureters and bladder normal.  Stomach/Bowel:  Stomach, small bowel and appendix are normal. The colon rectosigmoid colon are normal. There are diverticula of the sigmoid colon acute inflammation.  Mass in the C-loop of the duodenum described in the pancreatic section.  Vascular/Lymphatic: There is scattered calcification of the abdominal aorta. No retroperitoneal periportal lymphadenopathy. No mesenteric or peritoneal metastasis.  Reproductive: Prostate gland and bladder normal. No pelvic lymphadenopathy.  Other: No free fluid in the abdomen pelvis.  No free air  Musculoskeletal: No aggressive osseous lesion.  Review of the MIP images confirms the above findings.  IMPRESSION: Chest Impression:  No evidence of acute pulmonary embolism.  Abdomen / Pelvis Impression:  1. Mass adjacent to pancreatic head and along the C-loop of the duodenum measuring 6 cm. No pancreatic duct dilatation or biliary duct dilatation. Differential includes pancreatic neoplasm, gastrointestinal stromal tumor, or lymphoma. Recommend endoscopic ultrasound with biopsy of this lesion.  2. Local lymphadenopathy with enlarged peripancreatic lymph node.  3. Liver parenchyma is poorly evaluated on this essentially noncontrast exam.  Findings conveyed toDAWOOD ELGERGAWY on 05/10/2014  at19:17.   Electronically Signed   By: Suzy Bouchard M.D.   On: 05/10/2014 19:18    ENDOSCOPIC STUDIES: 04/2012   INDICATIONS:patient's personal history of adenomatous colon polyps and aden. polyps in 2003 and 2007. ENDOSCOPIC IMPRESSION: 1. Six sessile polyps ranging between 3-89mm in size were found throughout the entire examined colon; polypectomy was performed with cold forceps and with a cold snare 2. Mild diverticulosis was noted Pathology: - TUBULAR ADENOMAS (MULTIPLE FRAGMENTS). - HIGH GRADE DYSPLASIA IS NOT IDENTIFIED.  IMPRESSION:   *  Bloody BM. Without abdominal pain. Highest on the differential is a diverticular bleed. Unable to rule out upper GI bleed but his BUN is actually  down. He takes full dose PPI daily. Thus the suspicion for upper GI bleed is less. Lovenox discontinued.   He has documented diverticulosis as well as history of adenomatous colon polyp and is up-to-date on colonoscopy as of 11 2013  *  Mass at head of pancreas with associated lymphadenopathy. This needs evaluation, probably with endoscopic ultrasound/ERCP.  *  OSA.  On CPAP at home.   *  Leukocytosis/fever.  To 17,000/101.2.  Empiric vanco and cefipime in place. CT unrevealing as to source.   *  IDDM.  *  Compensated diastolic heart failure.   *  Chest pain, resolved.  Troponins negative.   *  ARF, improved with hydration.     PLAN:     *  At next lab draw will obtain CA 19 9. Follow blood counts following completion of 2 units of red cells. Will discuss with Dr. Deatra Ina whether or not to pursue EGD or colonoscopy. Note that Protonix drip was initiated. We'll discuss whether or not we can back off and switched to twice a day or daily dosing on this medication. If no plans for immediate endoscopy, and going to  allow the patient clear liquids.   Azucena Freed  05/11/2014, 8:25 AM Pager: [phone]

## 2014-05-11 NOTE — Progress Notes (Signed)
Patient Demographics  [name], is a 73 y.o. male, DOB - Oct 19, 1940, KDT:[id number]  Admit date - 05/09/2014   Admitting Physician Evalee Mutton Kristeen Mans, MD  Outpatient Primary MD for the patient is [name], MD  LOS - 2   Chief Complaint  Patient presents with  . Chest Pain  . Dizziness       Subjective:   [name] today has, No headache, No chest pain, No abdominal pain - No Nausea, No new weakness tingling or numbness, No Cough - SOB. She had 2 episodes of bright red blood per rectum.  Assessment & Plan    Principal Problem:   Palpitations Active Problems:   Mixed hyperlipidemia   CORONARY ATHEROSCLEROSIS NATIVE CORONARY ARTERY   Paroxysmal SVT (supraventricular tachycardia)   AKI (acute kidney injury)   HTN (hypertension)   Chronic diastolic CHF (congestive heart failure)   Leukocytosis   Orthostatic hypotension   Chest pain   Hypoglycemia   SIRS (systemic inflammatory response syndrome)   GI bleed  GI bleed -Patient had 2 episodes of significant bright red blood per rectum, aspirin was held, Lovenox was stopped, -Significant drop in hemoglobin 10.9-7.8 , is being transfused one unit packed red blood cell, on Protonix drip, denies any history of gastritis or gastric ulcer. -Monitor hemoglobin every 8 hours and transfuse as needed, -Gastroenterology consulted -Keep nothing by mouth in case there is plan for procedure to be done.  SIRS: Patient had fever 103 05/10/14 , has significant leukocytosis of 17,000, urine analysis is negative chest x-ray has no acute finding, blood cultures are negative so far. -Patient was started empirically on IV vancomycin and cefepime pending his septic workup (11/26-11/27), will continue to hold antibiotics as blood cultures are negative so far, and there is no evidence of infectious process. -This is  most likely related to pancreatic mass.  Pancreatic mass on imaging: -Gastroenterology were consulted.  Mixed hyperlipidemia: - Continue statin.  Diabetes mellitus - Uncontrolled - Patient will be kept on IV glucose stabilizer.  Chronic diastolic CHF (congestive heart failure):  - Clinically compensated, will watch carefully for volume overload as patient is on aggressive IV fluid resuscitation.  CORONARY ATHEROSCLEROSIS NATIVE CORONARY ARTERY:  - currently denies any further chest pain, has negative troponin 2.  Hypomagnesemia -recheck in a.m.  Acute renal failure -Improved with hydration    Code Status: Full  Family Communication: Patient is alert and oriented, wife at bedside  Disposition Plan: Maintain stepdown   Procedures  None One unit of blood cell transfusion 05/11/14  Consults   Cardiology Gastroenterology   Medications  Scheduled Meds: . sodium chloride   Intravenous Once  . antiseptic oral rinse  7 mL Mouth Rinse BID  . atorvastatin  20 mg Oral QHS  . gabapentin  200 mg Oral QHS  . insulin regular  0-10 Units Intravenous TID WC  . [START ON 05/14/2014] pantoprazole (PROTONIX) IV  40 mg Intravenous Q12H  . sodium chloride  3 mL Intravenous Q12H   Continuous Infusions: . sodium chloride Stopped (05/11/14 0642)  . dextrose 5 % and 0.45% NaCl 50 mL/hr at 05/11/14 0642  . insulin (NOVOLIN-R) infusion 8.3 Units/hr (05/11/14 0845)  . pantoprozole (PROTONIX) infusion 8 mg/hr (05/11/14 0335)   PRN  Meds:.acetaminophen **OR** acetaminophen, albuterol, alum & mag hydroxide-simeth, dextrose, furosemide, furosemide, guaiFENesin-dextromethorphan, ondansetron **OR** ondansetron (ZOFRAN) IV  DVT Prophylaxis  SCD  Lab Results  Component Value Date   PLT 234 05/11/2014    Antibiotics    Anti-infectives    Start     Dose/Rate Route Frequency Ordered Stop   05/10/14 2100  vancomycin (VANCOCIN) 1,250 mg in sodium chloride 0.9 % 250 mL IVPB  Status:   Discontinued     1,250 mg166.7 mL/hr over 90 Minutes Intravenous Every 12 hours 05/10/14 0753 05/11/14 0816   05/10/14 0830  vancomycin (VANCOCIN) 2,500 mg in sodium chloride 0.9 % 500 mL IVPB     2,500 mg250 mL/hr over 120 Minutes Intravenous NOW 05/10/14 0752 05/10/14 1128   05/10/14 0830  ceFEPIme (MAXIPIME) 1 g in dextrose 5 % 50 mL IVPB  Status:  Discontinued     1 g100 mL/hr over 30 Minutes Intravenous 3 times per day 05/10/14 0752 05/11/14 0816          Objective:   Filed Vitals:   05/11/14 0300 05/11/14 0400 05/11/14 0600 05/11/14 0716  BP: 106/48 121/46 108/54   Pulse:      Temp:  98.8 F (37.1 C)  98.3 F (36.8 C)  TempSrc:  Oral  Oral  Resp: 23 22 19    Height:      Weight:      SpO2:  96%      Wt Readings from Last 3 Encounters:  05/10/14 135.172 kg (298 lb)  01/22/14 141.069 kg (311 lb)  01/13/14 138.982 kg (306 lb 6.4 oz)     Intake/Output Summary (Last 24 hours) at 05/11/14 1109 Last data filed at 05/11/14 1000  Gross per 24 hour  Intake 1003.09 ml  Output   1401 ml  Net -397.91 ml     Physical Exam  Awake Alert, Oriented X 3, No new F.N deficits, Normal affect [place].AT,PERRAL Supple Neck,No JVD, No cervical lymphadenopathy appriciated.  Symmetrical Chest wall movement, Good air movement bilaterally, CTAB RRR,No Gallops,Rubs or new Murmurs, No Parasternal Heave +ve B.Sounds, Abd Soft, No tenderness, No organomegaly appriciated, No rebound - guarding or rigidity. No Cyanosis, Clubbing or edema, No new Rash or bruise     Data Review   Micro Results Recent Results (from the past 240 hour(s))  Blood Culture (routine x 2)     Status: None (Preliminary result)   Collection Time: 05/09/14  9:10 AM  Result Value Ref Range Status   Specimen Description BLOOD LEFT FOREARM  Final   Special Requests BOTTLES DRAWN AEROBIC AND ANAEROBIC 5CC  Final   Culture  Setup Time   Final    05/09/2014 18:03 Performed at [facility]    Culture   Final            BLOOD CULTURE RECEIVED NO GROWTH TO DATE CULTURE WILL BE HELD FOR 5 DAYS BEFORE ISSUING A FINAL NEGATIVE REPORT Performed at [facility]    Report Status PENDING  Incomplete  Urine culture     Status: None   Collection Time: 05/09/14  9:55 AM  Result Value Ref Range Status   Specimen Description URINE, CLEAN CATCH  Final   Special Requests NONE  Final   Culture  Setup Time   Final    05/09/2014 18:23 Performed at [place] Performed at [facility]   Final   Culture NO GROWTH Performed at [facility]  Final   Report Status 05/10/2014 FINAL  Final  Blood culture (routine x 2)     Status: None (Preliminary result)   Collection Time: 05/09/14 11:40 AM  Result Value Ref Range Status   Specimen Description BLOOD LEFT HAND  Final   Special Requests BOTTLES DRAWN AEROBIC ONLY 3CCS  Final   Culture  Setup Time   Final    05/09/2014 18:03 Performed at [facility]    Culture   Final           BLOOD CULTURE RECEIVED NO GROWTH TO DATE CULTURE WILL BE HELD FOR 5 DAYS BEFORE ISSUING A FINAL NEGATIVE REPORT Performed at [facility]    Report Status PENDING  Incomplete  MRSA PCR Screening     Status: None   Collection Time: 05/11/14  6:48 AM  Result Value Ref Range Status   MRSA by PCR NEGATIVE NEGATIVE Final    Comment:        The GeneXpert MRSA Assay (FDA approved for NASAL specimens only), is one component of a comprehensive MRSA colonization surveillance program. It is not intended to diagnose MRSA infection nor to guide or monitor treatment for MRSA infections.     Radiology Reports Ct Angio Chest Pe W/cm &/or Wo Cm  05/10/2014   CLINICAL DATA:  Leukocytosis, fever of unknown origin, short of breath, chest pain and dizziness.  EXAM: CT ANGIOGRAPHY CHEST  CT ABDOMEN AND PELVIS WITH CONTRAST  TECHNIQUE: Multidetector CT imaging of the chest was performed using the standard protocol during  bolus administration of intravenous contrast. Multiplanar CT image reconstructions and MIPs were obtained to evaluate the vascular anatomy. Multidetector CT imaging of the abdomen and pelvis was performed using the standard protocol during bolus administration of intravenous contrast.  CONTRAST:  10mL OMNIPAQUE IOHEXOL 350 MG/ML SOLN  COMPARISON:  80 mL Omnipaque 350  FINDINGS: CTA CHEST FINDINGS  No filling defects within the pulmonary arteries to suggest acute pulmonary embolism. No acute findings aorta great vessels. No pericardial fluid. Esophagus is normal.  Review of the lung parenchyma demonstrates no suspicious pulmonary nodules. No airspace disease. No pleural fluid.  CT ABDOMEN and PELVIS FINDINGS  Abdominal portion was scanned at a delayed time . Therefore it is essentially a noncontrast exam.  Hepatobiliary: No focal hepatic lesion identified on this noncontrast exam. Post cholecystectomy.  Pancreas: There is an mass at the level of the pancreatic head measuring 5.2 x 3.1 x 6.1 cm. This mass is along the medial border of the second portion the duodenum and compressive the lumen of the duodenum duodenum. There is enlarged lymph node adjacent to the mass measuring 19 mm. No pancreatic duct dilatation. There is no biliary duct dilatation.  Spleen: Normal spleen appear  Adrenals/Urinary Tract: Adrenal glands are normal. Delayed imaging of the kidneys demonstrate no obstruction. Ureters and bladder normal.  Stomach/Bowel: Stomach, small bowel and appendix are normal. The colon rectosigmoid colon are normal. There are diverticula of the sigmoid colon acute inflammation.  Mass in the C-loop of the duodenum described in the pancreatic section.  Vascular/Lymphatic: There is scattered calcification of the abdominal aorta. No retroperitoneal periportal lymphadenopathy. No mesenteric or peritoneal metastasis.  Reproductive: Prostate gland and bladder normal. No pelvic lymphadenopathy.  Other: No free fluid in the  abdomen pelvis.  No free air  Musculoskeletal: No aggressive osseous lesion.  Review of the MIP images confirms the above findings.  IMPRESSION: Chest Impression:  No evidence of acute pulmonary embolism.  Abdomen /  Pelvis Impression:  1. Mass adjacent to pancreatic head and along the C-loop of the duodenum measuring 6 cm. No pancreatic duct dilatation or biliary duct dilatation. Differential includes pancreatic neoplasm, gastrointestinal stromal tumor, or lymphoma. Recommend endoscopic ultrasound with biopsy of this lesion.  2. Local lymphadenopathy with enlarged peripancreatic lymph node.  3. Liver parenchyma is poorly evaluated on this essentially noncontrast exam.  Findings conveyed toDAWOOD Raizy Auzenne on 05/10/2014  at19:17.   Electronically Signed   By: Suzy Bouchard M.D.   On: 05/10/2014 19:18   Ct Abdomen Pelvis W Contrast  05/10/2014   CLINICAL DATA:  Leukocytosis, fever of unknown origin, short of breath, chest pain and dizziness.  EXAM: CT ANGIOGRAPHY CHEST  CT ABDOMEN AND PELVIS WITH CONTRAST  TECHNIQUE: Multidetector CT imaging of the chest was performed using the standard protocol during bolus administration of intravenous contrast. Multiplanar CT image reconstructions and MIPs were obtained to evaluate the vascular anatomy. Multidetector CT imaging of the abdomen and pelvis was performed using the standard protocol during bolus administration of intravenous contrast.  CONTRAST:  37mL OMNIPAQUE IOHEXOL 350 MG/ML SOLN  COMPARISON:  80 mL Omnipaque 350  FINDINGS: CTA CHEST FINDINGS  No filling defects within the pulmonary arteries to suggest acute pulmonary embolism. No acute findings aorta great vessels. No pericardial fluid. Esophagus is normal.  Review of the lung parenchyma demonstrates no suspicious pulmonary nodules. No airspace disease. No pleural fluid.  CT ABDOMEN and PELVIS FINDINGS  Abdominal portion was scanned at a delayed time . Therefore it is essentially a noncontrast exam.   Hepatobiliary: No focal hepatic lesion identified on this noncontrast exam. Post cholecystectomy.  Pancreas: There is an mass at the level of the pancreatic head measuring 5.2 x 3.1 x 6.1 cm. This mass is along the medial border of the second portion the duodenum and compressive the lumen of the duodenum duodenum. There is enlarged lymph node adjacent to the mass measuring 19 mm. No pancreatic duct dilatation. There is no biliary duct dilatation.  Spleen: Normal spleen appear  Adrenals/Urinary Tract: Adrenal glands are normal. Delayed imaging of the kidneys demonstrate no obstruction. Ureters and bladder normal.  Stomach/Bowel: Stomach, small bowel and appendix are normal. The colon rectosigmoid colon are normal. There are diverticula of the sigmoid colon acute inflammation.  Mass in the C-loop of the duodenum described in the pancreatic section.  Vascular/Lymphatic: There is scattered calcification of the abdominal aorta. No retroperitoneal periportal lymphadenopathy. No mesenteric or peritoneal metastasis.  Reproductive: Prostate gland and bladder normal. No pelvic lymphadenopathy.  Other: No free fluid in the abdomen pelvis.  No free air  Musculoskeletal: No aggressive osseous lesion.  Review of the MIP images confirms the above findings.  IMPRESSION: Chest Impression:  No evidence of acute pulmonary embolism.  Abdomen / Pelvis Impression:  1. Mass adjacent to pancreatic head and along the C-loop of the duodenum measuring 6 cm. No pancreatic duct dilatation or biliary duct dilatation. Differential includes pancreatic neoplasm, gastrointestinal stromal tumor, or lymphoma. Recommend endoscopic ultrasound with biopsy of this lesion.  2. Local lymphadenopathy with enlarged peripancreatic lymph node.  3. Liver parenchyma is poorly evaluated on this essentially noncontrast exam.  Findings conveyed toDAWOOD Miracle Mongillo on 05/10/2014  at19:17.   Electronically Signed   By: Suzy Bouchard M.D.   On: 05/10/2014 19:18     CBC  Recent Labs Lab 05/09/14 0755 05/09/14 1300 05/09/14 2037 05/10/14 2353 05/11/14 0620 05/11/14 0906  WBC 26.9* 21.4* 17.7*  --  17.5*  --  HGB 12.1* 10.9* 10.5* 9.1* 7.8* 8.9*  HCT 35.6* 32.0* 31.1* 27.2* 23.5* 26.2*  PLT 337 250 254  --  234  --   MCV 96.2 95.0 94.8  --  94.0  --   MCH 32.7 32.3 32.0  --  31.2  --   MCHC 34.0 34.1 33.8  --  33.2  --   RDW 12.7 12.8 12.7  --  12.7  --   LYMPHSABS 1.9  --  1.5  --   --   --   MONOABS 3.5*  --  1.6*  --   --   --   EOSABS 0.0  --  0.0  --   --   --   BASOSABS 0.0  --  0.0  --   --   --     Chemistries   Recent Labs Lab 05/09/14 0755 05/09/14 1300 05/09/14 2037 05/10/14 0046 05/11/14 0620  NA 135*  --  135*  --  139  K 4.0  --  4.6  --  4.6  CL 99  --  105  --  109  CO2 18*  --  16*  --  18*  GLUCOSE 38*  --  274*  --  275*  BUN 33*  --  30*  --  29*  CREATININE 1.44* 1.22 1.27  --  1.13  CALCIUM 9.2  --  8.0*  --  8.2*  MG  --   --   --  1.2*  --   AST 18  --  12  --   --   ALT 15  --  12  --   --   ALKPHOS 63  --  59  --   --   BILITOT 0.7  --  0.5  --   --    ------------------------------------------------------------------------------------------------------------------ estimated creatinine clearance is 89.8 mL/min (by C-G formula based on Cr of 1.13). ------------------------------------------------------------------------------------------------------------------ No results for input(s): HGBA1C in the last 72 hours. ------------------------------------------------------------------------------------------------------------------ No results for input(s): CHOL, HDL, LDLCALC, TRIG, CHOLHDL, LDLDIRECT in the last 72 hours. ------------------------------------------------------------------------------------------------------------------  Recent Labs  05/10/14 0046  TSH 0.714   ------------------------------------------------------------------------------------------------------------------ No  results for input(s): VITAMINB12, FOLATE, FERRITIN, TIBC, IRON, RETICCTPCT in the last 72 hours.  Coagulation profile No results for input(s): INR, PROTIME in the last 168 hours.  No results for input(s): DDIMER in the last 72 hours.  Cardiac Enzymes  Recent Labs Lab 05/09/14 1300 05/09/14 1850 05/10/14 0046  TROPONINI <0.30 <0.30 <0.30   ------------------------------------------------------------------------------------------------------------------ Invalid input(s): POCBNP     Time Spent in minutes   30 miutes   Dora Clauss M.D on 05/11/2014 at 11:09 AM  Between 7am to 7pm - Pager - [phone]  After 7pm go to [email or web address] - password [email or web address]  And look for the night coverage person covering for me after hours  [facility] Office  [phone]   **Disclaimer: This note may have been dictated with voice recognition software. Similar sounding words can inadvertently be transcribed and this note may contain transcription errors which may not have been corrected upon publication of note.**

## 2014-05-11 NOTE — Progress Notes (Signed)
PT Cancellation Note  Patient Details Name: [name] MRN: [id number] DOB: Nov 01, 1940   Cancelled Treatment:    Reason Eval/Treat Not Completed: Medical issues which prohibited therapy (GI bleed)   Kamri Gotsch 05/11/2014, 11:37 AM

## 2014-05-11 NOTE — Progress Notes (Signed)
SUBJECTIVE:  Events noted with GI bleed.  Treatment for a possible infectious etiology and work up of GI bleed and pancreatic mass is ongoing.  No acute cardiac complaints.     PHYSICAL EXAM Filed Vitals:   05/11/14 0300 05/11/14 0400 05/11/14 0600 05/11/14 0716  BP: 106/48 121/46 108/54   Pulse:      Temp:  98.8 F (37.1 C)  98.3 F (36.8 C)  TempSrc:  Oral  Oral  Resp: 23 22 19    Height:      Weight:      SpO2:  96%     General:  No acute distress Lungs:  Clear Heart:  RRR Abdomen:  Positive bowel sounds, no rebound no guarding Extremities:  No edema   LABS:  Results for orders placed or performed during the hospital encounter of 05/09/14 (from the past 24 hour(s))  Glucose, capillary     Status: Abnormal   Collection Time: 05/10/14 12:08 PM  Result Value Ref Range   Glucose-Capillary 320 (H) 70 - 99 mg/dL   Comment 1 Notify RN   Lactate dehydrogenase     Status: None   Collection Time: 05/10/14  2:39 PM  Result Value Ref Range   LDH 172 94 - 250 U/L  Glucose, capillary     Status: Abnormal   Collection Time: 05/10/14  4:21 PM  Result Value Ref Range   Glucose-Capillary 344 (H) 70 - 99 mg/dL   Comment 1 Notify RN   Glucose, capillary     Status: Abnormal   Collection Time: 05/10/14  6:57 PM  Result Value Ref Range   Glucose-Capillary 415 (H) 70 - 99 mg/dL   Comment 1 Notify RN   Glucose, capillary     Status: Abnormal   Collection Time: 05/10/14  9:17 PM  Result Value Ref Range   Glucose-Capillary 432 (H) 70 - 99 mg/dL  Hemoglobin and hematocrit, blood     Status: Abnormal   Collection Time: 05/10/14 11:53 PM  Result Value Ref Range   Hemoglobin 9.1 (L) 13.0 - 17.0 g/dL   HCT 27.2 (L) 39.0 - 52.0 %  Glucose, capillary     Status: Abnormal   Collection Time: 05/11/14  1:05 AM  Result Value Ref Range   Glucose-Capillary 321 (H) 70 - 99 mg/dL  Glucose, capillary     Status: Abnormal   Collection Time: 05/11/14  2:16 AM  Result Value Ref Range   Glucose-Capillary 296 (H) 70 - 99 mg/dL   Comment 1 Capillary Sample   Glucose, capillary     Status: Abnormal   Collection Time: 05/11/14  3:32 AM  Result Value Ref Range   Glucose-Capillary 282 (H) 70 - 99 mg/dL   Comment 1 Capillary Sample   Glucose, capillary     Status: Abnormal   Collection Time: 05/11/14  4:23 AM  Result Value Ref Range   Glucose-Capillary 279 (H) 70 - 99 mg/dL  Glucose, capillary     Status: Abnormal   Collection Time: 05/11/14  5:32 AM  Result Value Ref Range   Glucose-Capillary 252 (H) 70 - 99 mg/dL   Comment 1 Capillary Sample   CBC     Status: Abnormal   Collection Time: 05/11/14  6:20 AM  Result Value Ref Range   WBC 17.5 (H) 4.0 - 10.5 K/uL   RBC 2.50 (L) 4.22 - 5.81 MIL/uL   Hemoglobin 7.8 (L) 13.0 - 17.0 g/dL   HCT 23.5 (L) 39.0 - 52.0 %  MCV 94.0 78.0 - 100.0 fL   MCH 31.2 26.0 - 34.0 pg   MCHC 33.2 30.0 - 36.0 g/dL   RDW 12.7 11.5 - 15.5 %   Platelets 234 150 - 400 K/uL  Basic metabolic panel     Status: Abnormal   Collection Time: 05/11/14  6:20 AM  Result Value Ref Range   Sodium 139 137 - 147 mEq/L   Potassium 4.6 3.7 - 5.3 mEq/L   Chloride 109 96 - 112 mEq/L   CO2 18 (L) 19 - 32 mEq/L   Glucose, Bld 275 (H) 70 - 99 mg/dL   BUN 29 (H) 6 - 23 mg/dL   Creatinine, Ser 1.13 0.50 - 1.35 mg/dL   Calcium 8.2 (L) 8.4 - 10.5 mg/dL   GFR calc non Af Amer 63 (L) >90 mL/min   GFR calc Af Amer 73 (L) >90 mL/min   Anion gap 12 5 - 15  Glucose, capillary     Status: Abnormal   Collection Time: 05/11/14  6:35 AM  Result Value Ref Range   Glucose-Capillary 218 (H) 70 - 99 mg/dL   Comment 1 Capillary Sample   MRSA PCR Screening     Status: None   Collection Time: 05/11/14  6:48 AM  Result Value Ref Range   MRSA by PCR NEGATIVE NEGATIVE  Glucose, capillary     Status: Abnormal   Collection Time: 05/11/14  7:40 AM  Result Value Ref Range   Glucose-Capillary 183 (H) 70 - 99 mg/dL   Comment 1 Capillary Sample   Glucose, capillary     Status:  Abnormal   Collection Time: 05/11/14  8:45 AM  Result Value Ref Range   Glucose-Capillary 178 (H) 70 - 99 mg/dL   Comment 1 Capillary Sample   Hemoglobin and hematocrit, blood     Status: Abnormal   Collection Time: 05/11/14  9:06 AM  Result Value Ref Range   Hemoglobin 8.9 (L) 13.0 - 17.0 g/dL   HCT 26.2 (L) 39.0 - 52.0 %    Intake/Output Summary (Last 24 hours) at 05/11/14 0957 Last data filed at 05/11/14 0900  Gross per 24 hour  Intake 679.79 ml  Output   1401 ml  Net -721.21 ml    ASSESSMENT AND PLAN:  PRESYNCOPE:  There is some component of hypotension and orthostasis that contributed to this.  However, I doubt that a primary cardiac issues is the etiology.    EF is 60 - 65%.  There has been no significant dysrhythmia.  No further cardiac work up in hospital.    I would hold off on event monitor post discharge.  He would not need telemetry after discharge from the ICU per current guideline suggestions.  We are avoiding his beta blocker and ACE inhibitor because of his labile BPs.  Please call us at discharge if you have a questions about discharge meds.  Otherwise we will sign off.     Jeneen Rinks [facility] 05/11/2014 9:57 AM

## 2014-05-11 NOTE — Progress Notes (Signed)
Inpatient Diabetes Program Recommendations  AACE/ADA: New Consensus Statement on Inpatient Glycemic Control (2013)  Target Ranges:  Prepandial:   less than 140 mg/dL      Peak postprandial:   less than 180 mg/dL (1-2 hours)      Critically ill patients:  140 - 180 mg/dL   Consult per phone request to assess patient status for discontinuing the IV insulin drip at this time. However, the drip rate is still greater than 4.0 units/hr and cbg's are only beginning to stabilize less than 200 mg/dL. If pt eats or drinks any food or liquid with carbohydrate while on the GS insulin drip, RN can cover the carbohydrates grams using the program to enter carbs;  Inpatient Diabetes Program Recommendations Insulin - IV drip/GlucoStabilizer: Agree with continuing the IV insulin drip per GlucuoStabilizer at this time, as glucose levels are only beginning to atabilize below 200 mg/dL.  Drip rate should be no higher than 4.0 units/hr for 4 hrs before d/c'd. Insulin - Basal: When ready to transition off the drip, please order some basal lantus or levemir. Home doses of 70/30 are 70 units in the am 90 units in the pm. Total basal from this combination totals 106 units, however using weight based basal insulin at 0.2  -0.4 units/kg, would recommend -50 units lantus or levemir per day divided into 25 units bid. Correction (SSI): Would start at the moderate correction tidwc and HS scale. Insulin - Meal Coverage: (70/30 home combination totals 53 units per day, however would recommend once eating to use 6 units tidwc in addition to the correction scale tidwc and HS scale.  Please be sure to order the basal insulin to begin 2 hrs prior to discontinuation of insulin drip and to start correction insulin at the same moment the drip is discontinued. Will see patient to assure that noted home doses of insulin are current.  Thank you, Rosita Kea, RN, CNS, Diabetes Coordinator [phone])

## 2014-05-12 ENCOUNTER — Encounter (HOSPITAL_COMMUNITY): Payer: Self-pay | Admitting: *Deleted

## 2014-05-12 ENCOUNTER — Encounter (HOSPITAL_COMMUNITY)
Admission: EM | Disposition: A | Discharge status: Home / Self Care | Payer: Self-pay | Source: Home / Self Care | Attending: Internal Medicine

## 2014-05-12 HISTORY — PX: ESOPHAGOGASTRODUODENOSCOPY: SHX5428

## 2014-05-12 LAB — GLUCOSE, CAPILLARY
GLUCOSE-CAPILLARY: 128 mg/dL — AB (ref 70–99)
GLUCOSE-CAPILLARY: 130 mg/dL — AB (ref 70–99)
GLUCOSE-CAPILLARY: 132 mg/dL — AB (ref 70–99)
GLUCOSE-CAPILLARY: 151 mg/dL — AB (ref 70–99)
GLUCOSE-CAPILLARY: 163 mg/dL — AB (ref 70–99)
GLUCOSE-CAPILLARY: 172 mg/dL — AB (ref 70–99)
GLUCOSE-CAPILLARY: 192 mg/dL — AB (ref 70–99)
Glucose-Capillary: 121 mg/dL — ABNORMAL HIGH (ref 70–99)
Glucose-Capillary: 146 mg/dL — ABNORMAL HIGH (ref 70–99)
Glucose-Capillary: 151 mg/dL — ABNORMAL HIGH (ref 70–99)
Glucose-Capillary: 156 mg/dL — ABNORMAL HIGH (ref 70–99)
Glucose-Capillary: 157 mg/dL — ABNORMAL HIGH (ref 70–99)
Glucose-Capillary: 163 mg/dL — ABNORMAL HIGH (ref 70–99)
Glucose-Capillary: 164 mg/dL — ABNORMAL HIGH (ref 70–99)
Glucose-Capillary: 165 mg/dL — ABNORMAL HIGH (ref 70–99)
Glucose-Capillary: 171 mg/dL — ABNORMAL HIGH (ref 70–99)
Glucose-Capillary: 190 mg/dL — ABNORMAL HIGH (ref 70–99)
Glucose-Capillary: 291 mg/dL — ABNORMAL HIGH (ref 70–99)

## 2014-05-12 LAB — CANCER ANTIGEN 19-9: CA 19-9: 11.6 U/mL — ABNORMAL LOW (ref <35.0)

## 2014-05-12 LAB — BASIC METABOLIC PANEL
ANION GAP: 16 — AB (ref 5–15)
BUN: 16 mg/dL (ref 6–23)
CO2: 16 mEq/L — ABNORMAL LOW (ref 19–32)
Calcium: 8.8 mg/dL (ref 8.4–10.5)
Chloride: 104 mEq/L (ref 96–112)
Creatinine, Ser: 0.94 mg/dL (ref 0.50–1.35)
GFR calc Af Amer: >90 mL/min (ref >90)
GFR calc non Af Amer: 82 mL/min — ABNORMAL LOW (ref >90)
Glucose, Bld: 157 mg/dL — ABNORMAL HIGH (ref 70–99)
Potassium: 4.3 mEq/L (ref 3.7–5.3)
Sodium: 136 mEq/L — ABNORMAL LOW (ref 137–147)

## 2014-05-12 LAB — CBC
HCT: 27.6 % — ABNORMAL LOW (ref 39.0–52.0)
HCT: 24.6 % — ABNORMAL LOW (ref 39.0–52.0)
Hemoglobin: 9.1 g/dL — ABNORMAL LOW (ref 13.0–17.0)
Hemoglobin: 8.3 g/dL — ABNORMAL LOW (ref 13.0–17.0)
MCH: 31 pg (ref 26.0–34.0)
MCH: 31.2 pg (ref 26.0–34.0)
MCHC: 33 g/dL (ref 30.0–36.0)
MCHC: 33.7 g/dL (ref 30.0–36.0)
MCV: 93.9 fL (ref 78.0–100.0)
MCV: 92.5 fL (ref 78.0–100.0)
PLATELETS: 241 10*3/uL (ref 150–400)
Platelets: 261 10*3/uL (ref 150–400)
RBC: 2.94 MIL/uL — AB (ref 4.22–5.81)
RBC: 2.66 MIL/uL — AB (ref 4.22–5.81)
RDW: 13.3 % (ref 11.5–15.5)
RDW: 13.4 % (ref 11.5–15.5)
WBC: 19.2 10*3/uL — ABNORMAL HIGH (ref 4.0–10.5)
WBC: 23.8 10*3/uL — ABNORMAL HIGH (ref 4.0–10.5)

## 2014-05-12 LAB — HEMOGLOBIN A1C
Hgb A1c MFr Bld: 9.8 % — ABNORMAL HIGH (ref <5.7)
Mean Plasma Glucose: 235 mg/dL — ABNORMAL HIGH (ref <117)

## 2014-05-12 LAB — MAGNESIUM: Magnesium: 1.3 mg/dL — ABNORMAL LOW (ref 1.5–2.5)

## 2014-05-12 SURGERY — EGD (ESOPHAGOGASTRODUODENOSCOPY)
Anesthesia: Moderate Sedation

## 2014-05-12 MED ORDER — INSULIN ASPART 100 UNIT/ML ~~LOC~~ SOLN
0.0000 [IU] | Freq: Every day | SUBCUTANEOUS | Status: DC
  Administered 2014-05-12: 3 [IU] via SUBCUTANEOUS
  Administered 2014-05-13: 4 [IU] via SUBCUTANEOUS

## 2014-05-12 MED ORDER — SODIUM CHLORIDE 0.9 % IV SOLN: INTRAVENOUS | Status: DC

## 2014-05-12 MED ORDER — FENTANYL CITRATE 0.05 MG/ML IJ SOLN
INTRAMUSCULAR | Status: AC
  Filled 2014-05-12: qty 2

## 2014-05-12 MED ORDER — PANTOPRAZOLE SODIUM 40 MG IV SOLR
40.0000 mg | Freq: Two times a day (BID) | INTRAVENOUS | Status: DC
  Administered 2014-05-12 – 2014-05-13 (×4): 40 mg via INTRAVENOUS
  Filled 2014-05-12 (×4): qty 40

## 2014-05-12 MED ORDER — METOPROLOL TARTRATE 50 MG PO TABS
50.0000 mg | ORAL_TABLET | Freq: Two times a day (BID) | ORAL | Status: DC
  Administered 2014-05-12 – 2014-05-13 (×4): 50 mg via ORAL
  Filled 2014-05-12 (×2): qty 1
  Filled 2014-05-12: qty 2
  Filled 2014-05-12 (×2): qty 1

## 2014-05-12 MED ORDER — INSULIN ASPART 100 UNIT/ML ~~LOC~~ SOLN
0.0000 [IU] | Freq: Three times a day (TID) | SUBCUTANEOUS | Status: DC
  Administered 2014-05-12: 15 [IU] via SUBCUTANEOUS
  Administered 2014-05-12: 3 [IU] via SUBCUTANEOUS
  Administered 2014-05-13: 11 [IU] via SUBCUTANEOUS
  Administered 2014-05-13 (×2): 15 [IU] via SUBCUTANEOUS

## 2014-05-12 MED ORDER — FENTANYL CITRATE 0.05 MG/ML IJ SOLN
INTRAMUSCULAR | Status: DC | PRN
  Administered 2014-05-12: 25 ug via INTRAVENOUS

## 2014-05-12 MED ORDER — SODIUM CHLORIDE 0.9 % IV SOLN
500.0000 mg | Freq: Four times a day (QID) | INTRAVENOUS | Status: DC
  Administered 2014-05-12 – 2014-05-13 (×5): 500 mg via INTRAVENOUS
  Filled 2014-05-12 (×7): qty 500

## 2014-05-12 MED ORDER — MIDAZOLAM HCL 10 MG/2ML IJ SOLN
INTRAMUSCULAR | Status: DC | PRN
Start: 2014-05-12 — End: 2014-05-12
  Administered 2014-05-12: 1 mg via INTRAVENOUS
  Administered 2014-05-12: 2 mg via INTRAVENOUS

## 2014-05-12 MED ORDER — INSULIN GLARGINE 100 UNIT/ML ~~LOC~~ SOLN
25.0000 [IU] | Freq: Once | SUBCUTANEOUS | Status: AC
Start: 2014-05-12 — End: 2014-05-12
  Administered 2014-05-12: 25 [IU] via SUBCUTANEOUS
  Filled 2014-05-12: qty 0.25

## 2014-05-12 MED ORDER — INSULIN GLARGINE 100 UNIT/ML ~~LOC~~ SOLN
25.0000 [IU] | Freq: Two times a day (BID) | SUBCUTANEOUS | Status: DC
  Administered 2014-05-12: 25 [IU] via SUBCUTANEOUS
  Filled 2014-05-12 (×3): qty 0.25

## 2014-05-12 MED ORDER — MAGNESIUM SULFATE 2 GM/50ML IV SOLN
2.0000 g | Freq: Once | INTRAVENOUS | Status: AC
  Administered 2014-05-12: 2 g via INTRAVENOUS
  Filled 2014-05-12: qty 50

## 2014-05-12 MED ORDER — SODIUM CHLORIDE 0.9 % IV SOLN
INTRAVENOUS | Status: DC
  Administered 2014-05-12: 15:00:00 via INTRAVENOUS
  Administered 2014-05-13: 10 mL via INTRAVENOUS

## 2014-05-12 MED ORDER — MIDAZOLAM HCL 5 MG/ML IJ SOLN
INTRAMUSCULAR | Status: AC
  Filled 2014-05-12: qty 2

## 2014-05-12 NOTE — Progress Notes (Signed)
TEAM 1 - Stepdown/ICU TEAM Progress Note  [name] ALP:[id number] DOB: 1940-10-16 DOA: 05/09/2014 PCP: [name], MD  Admit HPI / Brief Narrative: 73 year old male with known history of obstructive sleep apnea, CAD/remote PCI, diastolic CHF, remote SVT status post ablation, recent plans for carpal tunnel surgery canceled due to poorly controlled blood sugar who presented to the ED due to palpitations, chest pain, and dizziness.  The patient was found to be orthostatic.  His cardiac enzymes were negative.  During his first night in the hospital (05/10/16) the patient spiked a fever over 103.  He denied cough, had no dysuria, chest x-ray was negative, urinalysis was negative, and leukocytosis on admission of 26K had improved to 17K.  He was started empirically on IV vancomycin and cefepime pending septic workup.  CT chest abdomen and pelvis was significant for a pancreatic mass.  He was seen by GI, with the plan for EUS as an outpatient.  Antibiotics were stopped on 1127/15 as no focus of infection could be identified.  During his second night in the hospital (05/11/14) the patient developed hematochezia (known to have a history of diverticulosis) w/ a drop off his hemoglobin from 10.9 on admission to 7.9.  He was transfused 1 unit packed red blood cells.  Additionally, the patient spiked a fever again, w/ worsening leukocytosis, so empiric IV antibiotic coverage was resumed.  HPI/Subjective: Pt is alert and conversant post EGD.  He understands the serious nature of the EGD findings.  He has no new complaints at this time.  I have attempted to walk he and his family through the process we will follow to work up this mass.   Assessment/Plan:  GI bleed - Acute painless hematochezia -2 episodes of BRBPR -  Aspirin held - Lovenox stopped -Significant drop in hemoglobin 10.9-7.8 - transfused one unit packed red blood cell -tumor itself was ulcerated, and likely the source of the  blood loss - watch for further bleeding post bx  Acute blood loss anemia  -due to GIB  -transfused 1U PRBC thus far -follow Hgb trend   SIRS - FUO -Tmax 103 05/10/14 w/ leukocytosis of 17,000 -UA negative - CXR no acute finding - flu PCR negative  -blood cultures negative so far -empirically on vancomycin and cefepime - will stop Vanc and cont cefepime only for now (as MRSA screen negative) -?due to mass/CA - ?suggestive of lymphoma   Pancreatic v/s duodenal mass   -GI has seen - EGD 11/28 reveals 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  CORONARY ATHEROSCLEROSIS NATIVE CORONARY ARTERY -currently denies any further chest pain, has negative troponin 3  Hypomagnesemia -persists - cont to replace and follow - K+ is normal   Acute renal failure -resolved w/ volume resuscitation   OSA  Obesity - Body mass index is 35.33 kg/(m^2).  Code Status: FULL Family Communication:  spoke w/ wife and children at bedside  Disposition Plan: SDU  Consultants: GI Cardiology   Procedures: EGD - 11/28 - results noted above   Antibiotics: Cefepime 11/26 Imipenem 11/27 > Vanc 11/26 >  DVT prophylaxis: SCDs  Objective: Blood pressure 140/41, pulse 72, temperature 98.4 F (36.9 C), temperature source Oral, resp. rate 29, height 6\' 5"  (1.956 m), weight  135.172 kg (298 lb), SpO2 93 %.  Intake/Output Summary (Last 24 hours) at 05/12/14 0838 Last data filed at 05/12/14 0700  Gross per 24 hour  Intake 3406.53 ml  Output   1025 ml  Net 2381.53 ml   Exam: General: No acute respiratory distress Lungs: Clear to auscultation bilaterally without wheezes or crackles Cardiovascular: Regular rate and rhythm without murmur gallop or rub normal S1 and S2 Abdomen: Nontender, nondistended, soft, bowel sounds positive, no rebound, no ascites, no appreciable mass Extremities: No significant cyanosis, clubbing, or edema bilateral lower extremities  Data Reviewed: Basic Metabolic Panel:  Recent Labs Lab 05/09/14 0755 05/09/14 1300 05/09/14 2037 05/10/14 0046 05/11/14 0620 05/12/14 0223  NA 135*  --  135*  --  139 136*  K 4.0  --  4.6  --  4.6 4.3  CL 99  --  105  --  109 104  CO2 18*  --  16*  --  18* 16*  GLUCOSE 38*  --  274*  --  275* 157*  BUN 33*  --  30*  --  29* 16  CREATININE 1.44* 1.22 1.27  --  1.13 0.94  CALCIUM 9.2  --  8.0*  --  8.2* 8.8  MG  --   --   --  1.2*  --  1.3*    Liver Function Tests:  Recent Labs Lab 05/09/14 0755 05/09/14 2037  AST 18 12  ALT 15 12  ALKPHOS 63 59  BILITOT 0.7 0.5  PROT 6.9 5.6*  ALBUMIN 2.7* 2.0*   No results for input(s): LIPASE, AMYLASE in the last 168 hours. No results for input(s): AMMONIA in the last 168 hours.  Coags: No results for input(s): INR in the last 168 hours.  Invalid input(s): PT No results for input(s): APTT in the last 168 hours.  CBC:  Recent Labs Lab 05/09/14 0755 05/09/14 1300 05/09/14 2037 05/10/14 2353 05/11/14 0620 05/11/14 0906 05/11/14 1757 05/12/14 0223  WBC 26.9* 21.4* 17.7*  --  17.5*  --  21.6* 19.2*  NEUTROABS 21.5*  --  14.6*  --   --   --   --   --   HGB 12.1* 10.9* 10.5* 9.1* 7.8* 8.9* 10.1* 9.1*  HCT 35.6* 32.0* 31.1* 27.2* 23.5* 26.2* 30.1* 27.6*  MCV 96.2 95.0 94.8  --  94.0  --  95.6 93.9  PLT 337 250 254  --  234  --  236 241      Cardiac Enzymes:  Recent Labs Lab 05/09/14 1300 05/09/14 1850 05/10/14 0046  TROPONINI <0.30 <0.30 <0.30   BNP (last 3 results)  Recent Labs  01/12/14 1244  PROBNP 67.7    CBG:  Recent Labs Lab 05/11/14 2207 05/11/14 2320 05/11/14 2353 05/12/14 0057 05/12/14 0826  GLUCAP 130* 128* 121* 132* 151*    Recent Results (from the past 240 hour(s))  Blood Culture (routine x 2)     Status: None (Preliminary result)   Collection Time: 05/09/14  9:10 AM  Result Value Ref Range Status   Specimen Description BLOOD LEFT FOREARM  Final   Special Requests BOTTLES  DRAWN AEROBIC AND ANAEROBIC 5CC  Final   Culture  Setup Time   Final    05/09/2014 18:03 Performed at [facility]    Culture   Final           BLOOD CULTURE RECEIVED NO GROWTH TO DATE CULTURE WILL BE HELD FOR 5 DAYS BEFORE ISSUING A FINAL NEGATIVE REPORT Performed at [facility]    Report Status PENDING  Incomplete  Urine culture     Status: None   Collection Time: 05/09/14  9:55 AM  Result Value Ref Range Status   Specimen Description URINE, CLEAN CATCH  Final   Special Requests NONE  Final   Culture  Setup Time   Final    05/09/2014 18:23 Performed at [place] Performed at [facility]   Final   Culture NO GROWTH Performed at [facility]   Final   Report Status 05/10/2014 FINAL  Final  Blood culture (routine x 2)     Status: None (Preliminary result)   Collection Time: 05/09/14 11:40 AM  Result Value Ref Range Status   Specimen Description BLOOD LEFT HAND  Final   Special Requests BOTTLES DRAWN AEROBIC ONLY 3CCS  Final   Culture  Setup Time   Final    05/09/2014 18:03 Performed at [facility]    Culture   Final           BLOOD CULTURE RECEIVED NO GROWTH TO DATE CULTURE WILL BE HELD FOR 5 DAYS BEFORE ISSUING A FINAL NEGATIVE REPORT Performed at [facility]    Report Status PENDING  Incomplete  MRSA PCR  Screening     Status: None   Collection Time: 05/11/14  6:48 AM  Result Value Ref Range Status   MRSA by PCR NEGATIVE NEGATIVE Final    Comment:        The GeneXpert MRSA Assay (FDA approved for NASAL specimens only), is one component of a comprehensive MRSA colonization surveillance program. It is not intended to diagnose MRSA infection nor to guide or monitor treatment for MRSA infections.      Studies:  Recent x-ray studies have been reviewed in detail by the Attending Physician  Scheduled Meds:  Scheduled Meds: . sodium chloride   Intravenous Once  . antiseptic oral rinse  7 mL Mouth Rinse BID  . atorvastatin  20 mg Oral QHS  . gabapentin  200 mg Oral QHS  . imipenem-cilastatin  500 mg Intravenous 3 times per day  . insulin regular  0-10 Units Intravenous TID WC  . [START ON 05/14/2014] pantoprazole (PROTONIX) IV  40 mg Intravenous Q12H  . sodium chloride  3 mL Intravenous Q12H  . vancomycin  1,250 mg Intravenous Q12H    Time spent on care of this patient: 35 mins   MCCLUNG,JEFFREY T , MD   [facility] Office  [phone] Pager - Text Page per Shea Evans as per below:  On-Call/Text Page:      [email or web address]      password [email or web address]  If 7PM-7AM, please contact night-coverage [email or web address] Password [email or web address] 05/12/2014, 8:38 AM   LOS: 3 days

## 2014-05-12 NOTE — Interval H&P Note (Signed)
History and Physical Interval Note:  05/12/2014 9:39 AM  [name]  has presented today for surgery, with the diagnosis of GI bleed  The various methods of treatment have been discussed with the patient and family. After consideration of risks, benefits and other options for treatment, the patient has consented to  Procedure(s): ESOPHAGOGASTRODUODENOSCOPY (EGD) (N/A) as a surgical intervention .  The patient's history has been reviewed, patient examined, no change in status, stable for surgery.  I have reviewed the patient's chart and labs.  Questions were answered to the patient's satisfaction.     Marcell Chavarin D

## 2014-05-12 NOTE — H&P (View-Only) (Signed)
[facility] Consult: 8:25 AM 05/11/2014  LOS: 2 days    Referring Provider: Dr.Elgergawy  Primary Care Physician: [name], MD Primary Gastroenterologist: Dr. Olevia Perches.     Reason for Consultation: 2 issues: Acute painless hematochezia. Head of pancreas mass seen on CT scan  HPI: [name] is a 73 y.o. male. Obese, poorly controlled diabetic with OSA, CAD/remote PCI, diastolic heart failure. Remote SVT ablation. S/p lap chole, knee replacements. Recent plans for carpal tunnel surgery cancelled due to out of control blood sugars. Takes an 81 mg ASA and Prilosec 20 mg daily.   Patient was seen by his primary M.D. as well as his endocrinologist regarding the elevated blood sugars. . His primary M.D. started him on a course of antibiotics which sound like may have been clindamycin. This sounds like it was started empirically because his white blood cell count was elevated. She denies having had fevers or diaphoresis. After starting the antibiotic he began to experience dysphagia located in the region of the pharynx and burning in his esophagus. After a few to several days the antibiotic was discontinued and his symptoms resolved. At the same time his lisinopril was also discontinued because he was experiencing balance and gait problems without syncope. The lisinopril was restarted Tuesday of this week and she immediately had the same problems with balance and gait. He also had some chest pressure and tachycardia and it was for this reason that he came to the emergency room and was admitted Since the cancel surgery on November 6, he has stuck closer to his diabetic restrictions. He is lost 20 pounds since then. He had not lost weight prior to adjusting his diet. He has not had early satiety.  Patient has ruled out for MI. He was started on DVT  prevention Lovenox at admission. Yesterday at 6 PM he had the first of 2 large volume episodes of painless hematochezia. These bloody stools were mixed with stool regular stool. The last bit of a stool looked darker in color than the initial very bright red color. No nausea, no abdominal pain. Patient has never had similar bleeding or even minor bleeding per rectum. His last colonoscopy was in 2013 and this showed some moderate diverticulosis, recurrence of adenomatous colon polyps and internal hemorrhoids. Patient is not using any nonsteroidal anti-inflammatory medications. He doesn't drink significantly. There is no known history of liver disease. He's not having any other unusual bleeding or bruising tendencies.  During this current hospitalization he has been placed on empiric antibiotics for fever and leukocytosis. However source of these remains undetermined. Just prior to developing the GI bleed the patient had noncontrasted CT scan angio of chest, and plain, contrasted CT of abdomen and pelvis. This study demonstrated a mass in the pancreatic head in the region of the duodenal C-loop associated with localized lymphadenopathy. Liver parenchyma was not well evaluated due to what was considered an essentially noncontrast exam. After developing the bleed, his Lovenox was discontinued. E myoglobin has gone from 10.9 down to 9.1 yesterday and today it is 7.8. His white count which was 26.9 is 17.5 today. Platelets are in the mid 200s. Alkaline phosphatase, transaminases and total bilirubin are normal. His albumin is low at 2. At admission his BUN was 33 and his creatinine was 1.4. This morning, following those episodes of GI bleeding the renal function is 29 and 1.1 There are no coags on records since January 2014 when they were normal M.D. has ordered 2 units of packed  red blood cells to be transfused as of 8:30 this morning.    Past Medical History  Diagnosis Date  . Diabetes mellitus     . Hyperlipidemia   . Hypertension   . GERD (gastroesophageal reflux disease)   . Coronary artery disease     a. S/P prior PTCA in the 90's; b. 04/2007 Cath: LM nl, LAD Ca2+ prox, LCX nl, RI nl, OM nl, RCA 40-50p, 40d, EF 60%; b. 09/2012 Echo: EF 55-60%, mild LVH, nl wall motion, Gr 1 DD, mildly dil LA.  . Right ureteral stone   . SVT (supraventricular tachycardia)     a. s/p ablation per Dr. Lovena Le 10 -70yrs ago  . OSA on CPAP     a. cpap setting of 16  . Arthritis   . Bilateral kidney stones   . Shortness of breath dyspnea   . CKD (chronic kidney disease), stage III     Past Surgical History  Procedure Laterality Date  . Right ureteroscopic stone extraction  09-26-2010  . Circumcision and fulgeration of condyloma  06-22-2003  . Cardiac electrophysiology study and ablation  2002  . Transthoracic echocardiogram  03-03-2011    MODERATE LVH/ LVSF NORMAL / EF 60-65%/ MILDLY DILATED LEFT ATRIUMK  . Severeal ureteroscopic stone extractions    . Ureteroscopy  11/13/2011    Procedure: URETEROSCOPY; Surgeon: Claybon Jabs, MD; Location: [facility]; Service: Urology; Laterality: Right; RIGHT URETEROSCOPY WITH HOLMIUM LASER LIHTOTRIPSY DIGITAL URETEROSCOPE  . Coronary angioplasty  1994    [place] [facility] [place] [facility]  . Cardiac catheterization  04-27-2007    CAD WITH 30% NARROWING IN THE MID-LAD/ 50 % NARROWING PROXIMAL CIRCUMFLEX/ 40% NARROWING SECOND MARGINAL BRANCH/ 40-50% PROXIMAL RCA WITH DISTAL POSTERIOR NARROWING/ NORMAL LVF  . Cardiac catheterization  2000    NON-OBSTRUCTIVE CAD  . Laparoscopic cholecystectomy  03-26-2005  . Total knee arthroplasty  07/18/2012    Procedure: TOTAL KNEE ARTHROPLASTY; Surgeon: Johnn Hai, MD; Location: [facility]; Service: Orthopedics; Laterality: Right;    Prior to Admission medications   Medication Sig Start Date End  Date Taking? Authorizing Provider  aspirin EC 81 MG tablet Take 81 mg by mouth daily.   Yes Historical Provider, MD  atorvastatin (LIPITOR) 20 MG tablet Take 20 mg by mouth at bedtime.   Yes Historical Provider, MD  doxazosin (CARDURA) 2 MG tablet Take 2 mg by mouth daily before breakfast.    Yes Historical Provider, MD  furosemide (LASIX) 20 MG tablet Take 60 mg by mouth daily. 01/09/14  Yes Burnell Blanks, MD  insulin NPH-regular Human (NOVOLIN 70/30) (70-30) 100 UNIT/ML injection Inject 45-65 Units into the skin 2 (two) times daily with a meal. 70 units in am and 90 units in pm   Yes Historical Provider, MD  lisinopril (PRINIVIL,ZESTRIL) 20 MG tablet Take 20 mg by mouth daily.   Yes Historical Provider, MD  metFORMIN (GLUCOPHAGE-XR) 500 MG 24 hr tablet Take 500 mg by mouth daily with breakfast.  05/01/14  Yes Historical Provider, MD  metoprolol (LOPRESSOR) 100 MG tablet Take 50 mg by mouth 2 (two) times daily.    Yes Historical Provider, MD  Multiple Vitamins-Minerals (PRESERVISION AREDS PO) Take 1 tablet by mouth 2 (two) times daily.   Yes Historical Provider, MD  omeprazole (PRILOSEC) 20 MG capsule Take 40 mg by mouth daily.    Yes Historical Provider, MD  potassium chloride SA (K-DUR,KLOR-CON) 20 MEQ tablet Take 1 tablet (20 mEq total) by mouth 2 (two) times  daily. 01/09/14  Yes Burnell Blanks, MD    Scheduled Meds: . sodium chloride  Intravenous Once  . antiseptic oral rinse 7 mL Mouth Rinse BID  . atorvastatin 20 mg Oral QHS  . gabapentin 200 mg Oral QHS  . insulin regular 0-10 Units Intravenous TID WC  . [START ON 05/14/2014] pantoprazole (PROTONIX) IV 40 mg Intravenous Q12H  . sodium chloride 3 mL Intravenous Q12H   Infusions: . sodium chloride Stopped (05/11/14 0642)  . dextrose 5 % and 0.45% NaCl 50 mL/hr at 05/11/14 0642  . insulin (NOVOLIN-R)  infusion 8.6 Units/hr (05/11/14 0730)  . pantoprozole (PROTONIX) infusion 8 mg/hr (05/11/14 0335)   PRN Meds: acetaminophen **OR** acetaminophen, albuterol, alum & mag hydroxide-simeth, dextrose, furosemide, furosemide, guaiFENesin-dextromethorphan, ondansetron **OR** ondansetron (ZOFRAN) IV   Allergies as of 05/09/2014 - Review Complete 05/09/2014  Allergen Reaction Noted  . Clindamycin/lincomycin Other (See Comments) 05/09/2014  . Codeine Itching 02/06/2010  . Penicillins Rash 02/06/2010    Family History  Problem Relation Age of Onset  . Heart attack Mother   . Heart attack Brother   . Colon cancer Neg Hx   . Stomach cancer Neg Hx     History   Social History  . Marital Status: Married    Spouse Name: N/A    Number of Children: N/A  . Years of Education: N/A   Occupational History  . Not on file.   Social History Main Topics  . Smoking status: Former Smoker -- 1.00 packs/day for 15 years    Types: Cigarettes    Quit date: 06/15/1986  . Smokeless tobacco: Never Used  . Alcohol Use: Yes     Comment: occasional  . Drug Use: No  . Sexual Activity: Not on file   Other Topics Concern  . Not on file   Social History Narrative    REVIEW OF SYSTEMS: Constitutional: Feels unwell, not a lot of energy. Weight loss as above ENT: No nose bleeds Pulm: No new cough, no significant DOE. CV: + palpitations, no LE edema.  GU: No hematuria, no frequency. No blood in the urine. Endocrine: Patient says his sugars got under better control in the time since his canceled surgery earlier this month. No sweats, no excessive thirst, no excessive urination GI: As per history of present illness. Generally the patient's omeprazole controls any reflux symptoms completely. He doesn't have dysphagia other than what was described above which was short-lived Heme: Has never  needed to take iron supplements, has never been told that his hemoglobin/hematocrit were low.  Transfusions: Never required Neuro: No headaches, no peripheral tingling or numbness Derm: No itching, no rash or sores.  Endocrine: No sweats or chills. No polyuria or dysuria Immunization: Patient had his flu shot on 05/10/2014 Travel: None beyond local counties in last few months.    PHYSICAL EXAM: Vital signs in last 24 hours: Filed Vitals:   05/11/14 0716  BP:   Pulse:   Temp: 98.3 F (36.8 C)  Resp:    Wt Readings from Last 3 Encounters:  05/10/14 298 lb (135.172 kg)  01/22/14 311 lb (141.069 kg)  01/13/14 306 lb 6.4 oz (138.982 kg)    General: Obese but very tall gentleman who looks younger than stated age. He does not look well his coloring is pale and ashen Head: No swelling, no asymmetry, no signs of trauma  Eyes: No icterus, no conjunctival pallor Ears: No obvious hearing loss  Nose: No congestion or discharge Mouth: Many absent teeth. No  significant caries. No inflamed gums. Mucous membranes are moist and clear. No signs of Candida Neck: No mass, no JVD, no thyromegaly Lungs: Decreased globally, no adventitious sounds. No cough. No dyspnea Heart: RRR, no MRG. S1 S2 audible Abdomen: Soft, nontender, obese, non-protuberant, bowel sounds hypoactive..  Rectal: Deferred  Musc/Skeltl: Scars at the knees Sr. with surgery Extremities: No pedal or lower extremity edema  Neurologic: Slipped historian, alert and oriented 3. No tremor, limb strength not tested. Skin: No telangiectasia, no rash, no significant purpura or hematoma Tattoos: None Nodes: No cervical adenopathy  Psych: Pleasant, affect somewhat depressed. Engaged, relaxed  Intake/Output from previous day: 11/26 0701 - 11/27 0700 In: 882.6 [P.O.:220; I.V.:562.6; IV Piggyback:100] Out: 1226 [Urine:1226] Intake/Output this shift:    LAB RESULTS:  Recent Labs  (last 2 labs)      Recent Labs  05/09/14 1300 05/09/14 2037 05/10/14 2353 05/11/14 0620  WBC 21.4* 17.7* --  17.5*  HGB 10.9* 10.5* 9.1* 7.8*  HCT 32.0* 31.1* 27.2* 23.5*  PLT 250 254 --  234     BMET  Recent Labs    Lab Results  Component Value Date   NA 139 05/11/2014   NA 135* 05/09/2014   NA 135* 05/09/2014   K 4.6 05/11/2014   K 4.6 05/09/2014   K 4.0 05/09/2014   CL 109 05/11/2014   CL 105 05/09/2014   CL 99 05/09/2014   CO2 18* 05/11/2014   CO2 16* 05/09/2014   CO2 18* 05/09/2014   GLUCOSE 275* 05/11/2014   GLUCOSE 274* 05/09/2014   GLUCOSE 38* 05/09/2014   BUN 29* 05/11/2014   BUN 30* 05/09/2014   BUN 33* 05/09/2014   CREATININE 1.13 05/11/2014   CREATININE 1.27 05/09/2014   CREATININE 1.22 05/09/2014   CALCIUM 8.2* 05/11/2014   CALCIUM 8.0* 05/09/2014   CALCIUM 9.2 05/09/2014     LFT  Recent Labs (last 2 labs)      Recent Labs  05/09/14 0755 05/09/14 2037  PROT 6.9 5.6*  ALBUMIN 2.7* 2.0*  AST 18 12  ALT 15 12  ALKPHOS 63 59  BILITOT 0.7 0.5     PT/INR  Recent Labs    Lab Results  Component Value Date   INR 1.03 07/08/2012   INR 0.9 RATIO 04/26/2007     Hepatitis Panel  Recent Labs (last 2 labs)     No results for input(s): HEPBSAG, HCVAB, HEPAIGM, HEPBIGM in the last 72 hours.   C-Diff  Recent Labs    No components found for: CDIFF   Lipase   Labs (Brief)    No results found for: LIPASE    Drugs of Abuse   Labs (Brief)    No results found for: LABOPIA, COCAINSCRNUR, LABBENZ, AMPHETMU, THCU, LABBARB     RADIOLOGY STUDIES:  Imaging Results (Last 48 hours)    Ct Angio Chest Pe W/cm &/or Wo Cm  05/10/2014 CLINICAL DATA: Leukocytosis, fever of unknown origin, short of breath, chest pain and dizziness. EXAM: CT ANGIOGRAPHY CHEST CT ABDOMEN AND PELVIS WITH  CONTRAST TECHNIQUE: Multidetector CT imaging of the chest was performed using the standard protocol during bolus administration of intravenous contrast. Multiplanar CT image reconstructions and MIPs were obtained to evaluate the vascular anatomy. Multidetector CT imaging of the abdomen and pelvis was performed using the standard protocol during bolus administration of intravenous contrast. CONTRAST: 77mL OMNIPAQUE IOHEXOL 350 MG/ML SOLN COMPARISON: 80 mL Omnipaque 350 FINDINGS: CTA CHEST FINDINGS No filling defects within the pulmonary arteries to suggest  acute pulmonary embolism. No acute findings aorta great vessels. No pericardial fluid. Esophagus is normal. Review of the lung parenchyma demonstrates no suspicious pulmonary nodules. No airspace disease. No pleural fluid. CT ABDOMEN and PELVIS FINDINGS Abdominal portion was scanned at a delayed time . Therefore it is essentially a noncontrast exam. Hepatobiliary: No focal hepatic lesion identified on this noncontrast exam. Post cholecystectomy. Pancreas: There is an mass at the level of the pancreatic head measuring 5.2 x 3.1 x 6.1 cm. This mass is along the medial border of the second portion the duodenum and compressive the lumen of the duodenum duodenum. There is enlarged lymph node adjacent to the mass measuring 19 mm. No pancreatic duct dilatation. There is no biliary duct dilatation. Spleen: Normal spleen appear Adrenals/Urinary Tract: Adrenal glands are normal. Delayed imaging of the kidneys demonstrate no obstruction. Ureters and bladder normal. Stomach/Bowel: Stomach, small bowel and appendix are normal. The colon rectosigmoid colon are normal. There are diverticula of the sigmoid colon acute inflammation. Mass in the C-loop of the duodenum described in the pancreatic section. Vascular/Lymphatic: There is scattered calcification of the abdominal aorta. No retroperitoneal periportal lymphadenopathy. No mesenteric or peritoneal metastasis.  Reproductive: Prostate gland and bladder normal. No pelvic lymphadenopathy. Other: No free fluid in the abdomen pelvis. No free air Musculoskeletal: No aggressive osseous lesion. Review of the MIP images confirms the above findings. IMPRESSION: Chest Impression: No evidence of acute pulmonary embolism. Abdomen / Pelvis Impression: 1. Mass adjacent to pancreatic head and along the C-loop of the duodenum measuring 6 cm. No pancreatic duct dilatation or biliary duct dilatation. Differential includes pancreatic neoplasm, gastrointestinal stromal tumor, or lymphoma. Recommend endoscopic ultrasound with biopsy of this lesion. 2. Local lymphadenopathy with enlarged peripancreatic lymph node. 3. Liver parenchyma is poorly evaluated on this essentially noncontrast exam. Findings conveyed toDAWOOD ELGERGAWY on 05/10/2014 at19:17. Electronically Signed By: Suzy Bouchard M.D. On: 05/10/2014 19:18   Ct Abdomen Pelvis W Contrast  05/10/2014 CLINICAL DATA: Leukocytosis, fever of unknown origin, short of breath, chest pain and dizziness. EXAM: CT ANGIOGRAPHY CHEST CT ABDOMEN AND PELVIS WITH CONTRAST TECHNIQUE: Multidetector CT imaging of the chest was performed using the standard protocol during bolus administration of intravenous contrast. Multiplanar CT image reconstructions and MIPs were obtained to evaluate the vascular anatomy. Multidetector CT imaging of the abdomen and pelvis was performed using the standard protocol during bolus administration of intravenous contrast. CONTRAST: 48mL OMNIPAQUE IOHEXOL 350 MG/ML SOLN COMPARISON: 80 mL Omnipaque 350 FINDINGS: CTA CHEST FINDINGS No filling defects within the pulmonary arteries to suggest acute pulmonary embolism. No acute findings aorta great vessels. No pericardial fluid. Esophagus is normal. Review of the lung parenchyma demonstrates no suspicious pulmonary nodules. No airspace disease. No pleural fluid. CT ABDOMEN and PELVIS FINDINGS  Abdominal portion was scanned at a delayed time . Therefore it is essentially a noncontrast exam. Hepatobiliary: No focal hepatic lesion identified on this noncontrast exam. Post cholecystectomy. Pancreas: There is an mass at the level of the pancreatic head measuring 5.2 x 3.1 x 6.1 cm. This mass is along the medial border of the second portion the duodenum and compressive the lumen of the duodenum duodenum. There is enlarged lymph node adjacent to the mass measuring 19 mm. No pancreatic duct dilatation. There is no biliary duct dilatation. Spleen: Normal spleen appear Adrenals/Urinary Tract: Adrenal glands are normal. Delayed imaging of the kidneys demonstrate no obstruction. Ureters and bladder normal. Stomach/Bowel: Stomach, small bowel and appendix are normal. The colon rectosigmoid colon are normal. There are  diverticula of the sigmoid colon acute inflammation. Mass in the C-loop of the duodenum described in the pancreatic section. Vascular/Lymphatic: There is scattered calcification of the abdominal aorta. No retroperitoneal periportal lymphadenopathy. No mesenteric or peritoneal metastasis. Reproductive: Prostate gland and bladder normal. No pelvic lymphadenopathy. Other: No free fluid in the abdomen pelvis. No free air Musculoskeletal: No aggressive osseous lesion. Review of the MIP images confirms the above findings. IMPRESSION: Chest Impression: No evidence of acute pulmonary embolism. Abdomen / Pelvis Impression: 1. Mass adjacent to pancreatic head and along the C-loop of the duodenum measuring 6 cm. No pancreatic duct dilatation or biliary duct dilatation. Differential includes pancreatic neoplasm, gastrointestinal stromal tumor, or lymphoma. Recommend endoscopic ultrasound with biopsy of this lesion. 2. Local lymphadenopathy with enlarged peripancreatic lymph node. 3. Liver parenchyma is poorly evaluated on this essentially noncontrast exam. Findings conveyed toDAWOOD ELGERGAWY on  05/10/2014 at19:17. Electronically Signed By: Suzy Bouchard M.D. On: 05/10/2014 19:18     ENDOSCOPIC STUDIES: 04/2012  INDICATIONS:patient's personal history of adenomatous colon polyps and aden. polyps in 2003 and 2007. ENDOSCOPIC IMPRESSION: 1. Six sessile polyps ranging between 3-4mm in size were found throughout the entire examined colon; polypectomy was performed with cold forceps and with a cold snare 2. Mild diverticulosis was noted Pathology: - TUBULAR ADENOMAS (MULTIPLE FRAGMENTS). - HIGH GRADE DYSPLASIA IS NOT IDENTIFIED.  IMPRESSION:   * Bloody BM. Without abdominal pain. Highest on the differential is a diverticular bleed. Unable to rule out upper GI bleed but his BUN is actually down. He takes full dose PPI daily. Thus the suspicion for upper GI bleed is less. Lovenox discontinued.  He has documented diverticulosis as well as history of adenomatous colon polyp and is up-to-date on colonoscopy as of 11 2013  * Mass at head of pancreas with associated lymphadenopathy. This needs evaluation, probably with endoscopic ultrasound/ERCP.  * OSA. On CPAP at home.   * Leukocytosis/fever. To 17,000/101.2. Empiric vanco and cefipime in place. CT unrevealing as to source.   * IDDM.  * Compensated diastolic heart failure.   * Chest pain, resolved. Troponins negative.   * ARF, improved with hydration.  GI Attending Note   Chart was reviewed and patient was examined. X-rays and lab were reviewed.    Patient has had an acute GI bleed.  Although clinically it appears like a lower GI bleed I am concerned about the mass adjacent to the duodenum which could be a duodenal or pancreatic mass.  This could erode into the duodenum and cause bleeding.  Currently, I will schedule him for upper endoscopy in the a.m with Dr. Benson Norway.  No mucosal abdomen abnormalities are seen he will require EUS for further herniation of the mass.Sandy Salaam. Deatra Ina, M.D.,  [facility] Cell [phone]

## 2014-05-12 NOTE — Op Note (Signed)
[facility] [place], [id number]   ENDOSCOPY PROCEDURE REPORT  PATIENT: [name], [name]  MR#: [id number] BIRTHDATE: 12-02-1940 , 73  yrs. old GENDER: male ENDOSCOPIST:Tyhesha Dutson Benson Norway, MD REFERRED BY: PROCEDURE DATE:  Jun 10, 2014 PROCEDURE:   EGD w/ biopsy ASA CLASS:    Class III INDICATIONS: Duodenal/Pancreatic head mass MEDICATION: Versed 4 mg IV and Fentanyl 25 mcg IV TOPICAL ANESTHETIC:   none  DESCRIPTION OF PROCEDURE:   After the risks and benefits of the procedure were explained, informed consent was obtained.  The endoscope  endoscope was introduced through the mouth  and advanced to the second portion of the duodenum .  The instrument was slowly withdrawn as the mucosa was fully examined.   FINDINGS: The esophagus was normal as well as the gastric lumen. Upon entryinto the duodenal bulb a large mass was identified. This extended in to the second portion of the duodenum.  The lumen was compressed, but not obstructed at this point.  A central flat nonbleeding ulceration was identified at the apex of the mass.  It does not appear to encompass the ampulla.  It was difficult to determine if this was a submucosal mass or extension of a pancreatic mass with invasion of the duodenum.  Two cold biopsies were obtained, but this resulted in rapiod oozing of blood, which did slow down with observation.  I did not pursue any further biopsies in feaer of worsening the bleeding.    Retroflexed views revealed no abnormalities.    The scope was then withdrawn from the patient and the procedure completed. COMPLICATIONS: There were no immediate complications.  ENDOSCOPIC IMPRESSION: 1) Duodenal mass with significant luminal compression.  RECOMMENDATIONS: 1) Await biopsy results. 2) EUS will most likely be required if the biopsies are inconclusive. _______________________________ eSigned:  Carol Ada, MD Jun 10, 2014 9:49 AM   cc: CPT  CODES: ICD CODES:  The ICD and CPT codes recommended by this software are interpretations from the data that the clinical staff has captured with the software.  The verification of the translation of this report to the ICD and CPT codes and modifiers is the sole responsibility of the health care institution and practicing physician where this report was generated.  [place]. will not be held responsible for the validity of the ICD and CPT codes included on this report.  AMA assumes no liability for data contained or not contained herein. CPT is a Designer, television/film set of the [employer].

## 2014-05-12 NOTE — Progress Notes (Signed)
Pt. Wearing his home cpap. Pt. Tolerating well at this time.

## 2014-05-12 NOTE — Progress Notes (Signed)
Brief narrative/history of present illness: [name] a 73 year old male, with known history of obstructive sleep apnea, CAD/remote PCI, diastolic CHF, remote SVT status post ablation, recent plans for carpal tunnel surgery consult due to poorly controlled blood sugar, patient presents to ED due to complaints of palpitation chest pain and dizziness, patient was found to be orthostatic, early leakage were consulted initially giving her dizziness, orthostatic and chest pain, his cardiac enzymes were negative, he was ruled out. During first night in hospital stay 05/10/16 patient spiked a fever over 103, he denies any cough, any dysuria, chest x-ray was negative, urinalysis was negative,,and had leukocytosis on admission of 26,000, which improved to 17,000 , patient was started empirically on IV vancomycin and cefepime pending septic workup ,CT chest abdomen and pelvis was obtained to evaluate for source of his sepsis, workup was significant for pancreatic mass, for which he was seen by gastroenterology, with the plan for EUS as an outpatient, antibiotics were stopped on 1127/15 as of infection could be identified. During second night of hospital stay 05/11/14 patient developed hematochezia, he is known to have history of diverticulosis, which is thought to be the source of his bleed, patient had significant drop off his hemoglobin from 10.9 on admission to 7.9, so he was transfused 1 unit packed red blood cell 05/11/14. Patient spiked fever again, was worsening leukocytosis, so he was resumed again on empiric IV antibiotic coverage vancomycin and imipenem, with the plan for ID consult on 05/12/14 as discussed with the family.  Phillips Climes MD.

## 2014-05-13 ENCOUNTER — Inpatient Hospital Stay (HOSPITAL_COMMUNITY): Payer: Medicare Other

## 2014-05-13 LAB — COMPREHENSIVE METABOLIC PANEL
ALT: 12 U/L (ref 0–53)
AST: 13 U/L (ref 0–37)
Albumin: 1.7 g/dL — ABNORMAL LOW (ref 3.5–5.2)
Alkaline Phosphatase: 57 U/L (ref 39–117)
Anion gap: 14 (ref 5–15)
BILIRUBIN TOTAL: 0.6 mg/dL (ref 0.3–1.2)
BUN: 17 mg/dL (ref 6–23)
CO2: 17 meq/L — AB (ref 19–32)
Calcium: 8.6 mg/dL (ref 8.4–10.5)
Chloride: 103 mEq/L (ref 96–112)
Creatinine, Ser: 1.14 mg/dL (ref 0.50–1.35)
GFR, EST AFRICAN AMERICAN: 72 mL/min — AB (ref >90)
GFR, EST NON AFRICAN AMERICAN: 62 mL/min — AB (ref >90)
GLUCOSE: 320 mg/dL — AB (ref 70–99)
Potassium: 4.6 mEq/L (ref 3.7–5.3)
Sodium: 134 mEq/L — ABNORMAL LOW (ref 137–147)
Total Protein: 5.4 g/dL — ABNORMAL LOW (ref 6.0–8.3)

## 2014-05-13 LAB — GLUCOSE, CAPILLARY
Glucose-Capillary: 342 mg/dL — ABNORMAL HIGH (ref 70–99)
Glucose-Capillary: 286 mg/dL — ABNORMAL HIGH (ref 70–99)
Glucose-Capillary: 337 mg/dL — ABNORMAL HIGH (ref 70–99)
Glucose-Capillary: 315 mg/dL — ABNORMAL HIGH (ref 70–99)

## 2014-05-13 LAB — CBC
HCT: 26.2 % — ABNORMAL LOW (ref 39.0–52.0)
Hemoglobin: 8.7 g/dL — ABNORMAL LOW (ref 13.0–17.0)
MCH: 30.9 pg (ref 26.0–34.0)
MCHC: 33.2 g/dL (ref 30.0–36.0)
MCV: 92.9 fL (ref 78.0–100.0)
Platelets: 246 10*3/uL (ref 150–400)
RBC: 2.82 MIL/uL — ABNORMAL LOW (ref 4.22–5.81)
RDW: 13.3 % (ref 11.5–15.5)
WBC: 21.4 10*3/uL — AB (ref 4.0–10.5)

## 2014-05-13 LAB — LIPASE, BLOOD: LIPASE: 18 U/L (ref 11–59)

## 2014-05-13 LAB — PROCALCITONIN: PROCALCITONIN: 0.31 ng/mL

## 2014-05-13 MED ORDER — CETYLPYRIDINIUM CHLORIDE 0.05 % MT LIQD
7.0000 mL | Freq: Two times a day (BID) | OROMUCOSAL | Status: DC
  Administered 2014-05-14 – 2014-05-18 (×10): 7 mL via OROMUCOSAL

## 2014-05-13 MED ORDER — SODIUM CHLORIDE 0.9 % IJ SOLN
10.0000 mL | INTRAMUSCULAR | Status: DC | PRN
  Administered 2014-05-13: 10 mL
  Filled 2014-05-13: qty 40

## 2014-05-13 MED ORDER — DOXAZOSIN MESYLATE 2 MG PO TABS
2.0000 mg | ORAL_TABLET | Freq: Every day | ORAL | Status: DC
  Administered 2014-05-14 – 2014-05-19 (×5): 2 mg via ORAL
  Filled 2014-05-13 (×7): qty 1

## 2014-05-13 MED ORDER — INSULIN GLARGINE 100 UNIT/ML ~~LOC~~ SOLN
40.0000 [IU] | Freq: Two times a day (BID) | SUBCUTANEOUS | Status: DC
  Administered 2014-05-14: 40 [IU] via SUBCUTANEOUS
  Filled 2014-05-13 (×2): qty 0.4

## 2014-05-13 MED ORDER — ALBUTEROL SULFATE (2.5 MG/3ML) 0.083% IN NEBU: 2.5000 mg | INHALATION_SOLUTION | RESPIRATORY_TRACT | Status: DC | PRN

## 2014-05-13 MED ORDER — SODIUM CHLORIDE 0.9 % IJ SOLN
10.0000 mL | Freq: Two times a day (BID) | INTRAMUSCULAR | Status: DC
  Administered 2014-05-13: 10 mL

## 2014-05-13 MED ORDER — ALUM & MAG HYDROXIDE-SIMETH 200-200-20 MG/5ML PO SUSP: 30.0000 mL | Freq: Four times a day (QID) | ORAL | Status: DC | PRN

## 2014-05-13 MED ORDER — INSULIN GLARGINE 100 UNIT/ML ~~LOC~~ SOLN
40.0000 [IU] | Freq: Two times a day (BID) | SUBCUTANEOUS | Status: DC
  Administered 2014-05-13 (×2): 40 [IU] via SUBCUTANEOUS
  Filled 2014-05-13 (×2): qty 0.4

## 2014-05-13 MED ORDER — LISINOPRIL 10 MG PO TABS
10.0000 mg | ORAL_TABLET | Freq: Every day | ORAL | Status: DC
  Administered 2014-05-13: 10 mg via ORAL
  Filled 2014-05-13: qty 1

## 2014-05-13 MED ORDER — ONDANSETRON HCL 4 MG/2ML IJ SOLN: 4.0000 mg | Freq: Four times a day (QID) | INTRAMUSCULAR | Status: DC | PRN

## 2014-05-13 MED ORDER — ACETAMINOPHEN 325 MG PO TABS
650.0000 mg | ORAL_TABLET | Freq: Four times a day (QID) | ORAL | Status: DC | PRN
  Administered 2014-05-15 – 2014-05-18 (×4): 650 mg via ORAL
  Filled 2014-05-13 (×5): qty 2

## 2014-05-13 MED ORDER — DEXTROSE 50 % IV SOLN: 25.0000 mL | INTRAVENOUS | Status: DC | PRN

## 2014-05-13 MED ORDER — LISINOPRIL 10 MG PO TABS
10.0000 mg | ORAL_TABLET | Freq: Every day | ORAL | Status: DC
  Administered 2014-05-14 – 2014-05-19 (×5): 10 mg via ORAL
  Filled 2014-05-13 (×6): qty 1

## 2014-05-13 MED ORDER — DOXAZOSIN MESYLATE 2 MG PO TABS
2.0000 mg | ORAL_TABLET | Freq: Every day | ORAL | Status: DC
  Filled 2014-05-13: qty 1

## 2014-05-13 MED ORDER — GABAPENTIN 100 MG PO CAPS
200.0000 mg | ORAL_CAPSULE | Freq: Every day | ORAL | Status: DC
  Administered 2014-05-14 – 2014-05-18 (×5): 200 mg via ORAL
  Filled 2014-05-13 (×6): qty 2

## 2014-05-13 MED ORDER — PANTOPRAZOLE SODIUM 40 MG IV SOLR: 40.0000 mg | Freq: Two times a day (BID) | INTRAVENOUS | Status: DC

## 2014-05-13 MED ORDER — ONDANSETRON HCL 4 MG PO TABS: 4.0000 mg | ORAL_TABLET | Freq: Four times a day (QID) | ORAL | Status: DC | PRN

## 2014-05-13 MED ORDER — ATORVASTATIN CALCIUM 20 MG PO TABS
20.0000 mg | ORAL_TABLET | Freq: Every day | ORAL | Status: DC
Start: 2014-05-14 — End: 2014-05-19
  Administered 2014-05-14 – 2014-05-18 (×5): 20 mg via ORAL
  Filled 2014-05-13 (×6): qty 1

## 2014-05-13 MED ORDER — PANTOPRAZOLE SODIUM 40 MG IV SOLR
40.0000 mg | Freq: Two times a day (BID) | INTRAVENOUS | Status: DC
  Filled 2014-05-13 (×2): qty 40

## 2014-05-13 MED ORDER — ACETAMINOPHEN 650 MG RE SUPP: 650.0000 mg | Freq: Four times a day (QID) | RECTAL | Status: DC | PRN

## 2014-05-13 MED ORDER — SODIUM CHLORIDE 0.9 % IJ SOLN
10.0000 mL | Freq: Two times a day (BID) | INTRAMUSCULAR | Status: DC
  Administered 2014-05-14: 10 mL

## 2014-05-13 MED ORDER — INSULIN ASPART 100 UNIT/ML ~~LOC~~ SOLN
6.0000 [IU] | Freq: Three times a day (TID) | SUBCUTANEOUS | Status: DC
  Administered 2014-05-13 (×2): 6 [IU] via SUBCUTANEOUS

## 2014-05-13 MED ORDER — INSULIN ASPART 100 UNIT/ML ~~LOC~~ SOLN
0.0000 [IU] | Freq: Every day | SUBCUTANEOUS | Status: DC
  Administered 2014-05-15 – 2014-05-16 (×2): 3 [IU] via SUBCUTANEOUS
  Administered 2014-05-17: 4 [IU] via SUBCUTANEOUS

## 2014-05-13 MED ORDER — SODIUM CHLORIDE 0.9 % IV SOLN
INTRAVENOUS | Status: DC
  Administered 2014-05-14 – 2014-05-16 (×3): via INTRAVENOUS

## 2014-05-13 MED ORDER — METOPROLOL TARTRATE 50 MG PO TABS
50.0000 mg | ORAL_TABLET | Freq: Two times a day (BID) | ORAL | Status: DC
  Administered 2014-05-14 – 2014-05-19 (×9): 50 mg via ORAL
  Filled 2014-05-13 (×12): qty 1

## 2014-05-13 MED ORDER — GUAIFENESIN-DM 100-10 MG/5ML PO SYRP
5.0000 mL | ORAL_SOLUTION | ORAL | Status: DC | PRN
  Filled 2014-05-13: qty 5

## 2014-05-13 MED ORDER — INSULIN ASPART 100 UNIT/ML ~~LOC~~ SOLN
6.0000 [IU] | Freq: Three times a day (TID) | SUBCUTANEOUS | Status: DC
  Administered 2014-05-14: 6 [IU] via SUBCUTANEOUS

## 2014-05-13 MED ORDER — INSULIN ASPART 100 UNIT/ML ~~LOC~~ SOLN
0.0000 [IU] | Freq: Three times a day (TID) | SUBCUTANEOUS | Status: DC
  Administered 2014-05-14: 11 [IU] via SUBCUTANEOUS
  Administered 2014-05-14: 20 [IU] via SUBCUTANEOUS
  Administered 2014-05-14: 15 [IU] via SUBCUTANEOUS
  Administered 2014-05-15: 20 [IU] via SUBCUTANEOUS
  Administered 2014-05-15: 11 [IU] via SUBCUTANEOUS
  Administered 2014-05-15: 7 [IU] via SUBCUTANEOUS
  Administered 2014-05-16 (×2): 11 [IU] via SUBCUTANEOUS
  Administered 2014-05-16: 7 [IU] via SUBCUTANEOUS
  Administered 2014-05-17: 11 [IU] via SUBCUTANEOUS
  Administered 2014-05-17: 7 [IU] via SUBCUTANEOUS
  Administered 2014-05-17 – 2014-05-18 (×2): 11 [IU] via SUBCUTANEOUS
  Administered 2014-05-18: 4 [IU] via SUBCUTANEOUS
  Administered 2014-05-18: 11 [IU] via SUBCUTANEOUS
  Administered 2014-05-19: 15 [IU] via SUBCUTANEOUS

## 2014-05-13 MED ORDER — SODIUM CHLORIDE 0.9 % IJ SOLN
10.0000 mL | INTRAMUSCULAR | Status: DC | PRN
  Administered 2014-05-15 – 2014-05-18 (×8): 10 mL
  Filled 2014-05-13 (×8): qty 40

## 2014-05-13 MED ORDER — IMIPENEM-CILASTATIN 500 MG IV SOLR
500.0000 mg | Freq: Four times a day (QID) | INTRAVENOUS | Status: DC
  Administered 2014-05-13 – 2014-05-16 (×9): 500 mg via INTRAVENOUS
  Filled 2014-05-13 (×14): qty 500

## 2014-05-13 MED ORDER — SODIUM CHLORIDE 0.9 % IJ SOLN
3.0000 mL | Freq: Two times a day (BID) | INTRAMUSCULAR | Status: DC
  Administered 2014-05-14 – 2014-05-18 (×4): 3 mL via INTRAVENOUS

## 2014-05-13 MED ORDER — INSULIN GLARGINE 100 UNIT/ML ~~LOC~~ SOLN: 35.0000 [IU] | Freq: Two times a day (BID) | SUBCUTANEOUS | Status: DC

## 2014-05-13 NOTE — Progress Notes (Signed)
Pt. Wearing home cpap. Placed on by RN. Tolerating well.

## 2014-05-13 NOTE — Progress Notes (Signed)
Peripherally Inserted Central Catheter/Midline Placement  The IV Nurse has discussed with the patient and/or persons authorized to consent for the patient, the purpose of this procedure and the potential benefits and risks involved with this procedure.  The benefits include less needle sticks, lab draws from the catheter and patient may be discharged home with the catheter.  Risks include, but not limited to, infection, bleeding, blood clot (thrombus formation), and puncture of an artery; nerve damage and irregular heat beat.  Alternatives to this procedure were also discussed.  PICC/Midline Placement Documentation  PICC / Midline Double Lumen 26/94/85 PICC Left Basilic 55 cm 1 cm (Active)  Indication for Insertion or Continuance of Line Prolonged intravenous therapies;Poor Vasculature-patient has had multiple peripheral attempts or PIVs lasting less than 24 hours 05/13/2014  9:00 AM  Exposed Catheter (cm) 1 cm 05/13/2014  9:00 AM  Site Assessment Clean;Dry;Intact 05/13/2014  9:00 AM  Lumen #1 Status Flushed;Saline locked;Blood return noted 05/13/2014  9:00 AM  Lumen #2 Status Flushed;Saline locked;Blood return noted 05/13/2014  9:00 AM  Dressing Type Transparent 05/13/2014  9:00 AM  Dressing Status Clean;Dry;Intact;Antimicrobial disc in place 05/13/2014  9:00 AM  Line Care Connections checked and tightened 05/13/2014  9:00 AM  Line Adjustment (NICU/IV Team Only) No 05/13/2014  9:00 AM  Dressing Intervention New dressing 05/13/2014  9:00 AM  Dressing Change Due 05/20/14 05/13/2014  9:00 AM       Rolena Infante 05/13/2014, 9:21 AM

## 2014-05-13 NOTE — Progress Notes (Signed)
[facility] TEAM 1 - Stepdown/ICU TEAM Progress Note  LARREN COPES LTJ:[id number] DOB: 1941/03/29 DOA: 05/09/2014 PCP: [name], MD  Admit HPI / Brief Narrative: 73 year old male with known history of obstructive sleep apnea, CAD/remote PCI, diastolic CHF, remote SVT status post ablation, and recent plans for carpal tunnel surgery canceled due to poorly controlled blood sugar who presented to the ED due to palpitations, chest pain, and dizziness.  The patient was found to be orthostatic.  His cardiac enzymes were negative.  During his first night in the hospital (05/10/16) the patient spiked a fever over 103.  He denied cough, had no dysuria, chest x-ray was negative, urinalysis was negative, and leukocytosis on admission of 26K had improved to 17K.  He was started empirically on IV vancomycin and cefepime pending septic workup.  CT chest abdomen and pelvis was significant for a mass in the region of the pancreatic head.  He was seen by GI.  Antibiotics were stopped on 1127/15 as no focus of infection could be identified.  During his second night in the hospital (05/11/14) the patient developed hematochezia (known to have a history of diverticulosis) w/ a drop off his hemoglobin from 10.9 on admission to 7.9.  He was transfused 1 unit packed red blood cells.  Additionally, the patient spiked a fever again, w/ worsening leukocytosis, so empiric IV antibiotic coverage was resumed.  HPI/Subjective: Resting comfortably today. C/o swelling in his hands.  Denies cp, sob, abdom pain.    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) -  plan to remain in hospital until diagnosis and tx plan are complete, as time is of the essence   GI bleed - Acute painless hematochezia -2 episodes of BRBPR -  Aspirin held - Lovenox stopped -Significant drop in hemoglobin 10.9 > 7.8 - transfused one unit packed red blood cell -tumor itself was ulcerated, and likely the source of the blood loss - watch for further bleeding post bx -Hgb stable/improving at this time   Acute blood loss anemia  -due to GIB  -transfused 1U PRBC thus far -Hgb stable/improving at this time   SIRS - FUO -Tmax 103 05/10/14 w/ leukocytosis of 17,000 - Tmax last night up to 101.5 -UA negative - CXR no acute finding - flu PCR negative  -blood cultures remain negative so far -recheck CXR today post hydration to assess for infiltrate -empirically on primaxin, though we have no idea what, if anything, we are treating  -?due to mass/CA - ?suggestive of lymphoma   Mixed hyperlipidemia - Continue statin for now   Diabetes mellitus -severely uncontrolled - required IV glucose stabilizer at admission - gtt stopped 11/28 - CBG poorly controlled - adjust tx further and follow - A1c 9.8  Chronic diastolic CHF  -Clinically compensated - watch carefully for volume overload on IV fluid resuscitation - slow IVF today   CORONARY ATHEROSCLEROSIS NATIVE CORONARY ARTERY -currently denies chest pain - negative troponin 3  Hypomagnesemia -replaced - recheck in AM and follow - K+ is normal   Acute renal failure -resolved w/ volume resuscitation   OSA  Obesity - Body mass index is  35.33 kg/(m^2).  Code Status: FULL Family Communication: spoke w/ multiple family members at bedside  Disposition Plan: SDU  Consultants: GI Cardiology   Procedures: EGD - 11/28 - results noted above   Antibiotics: Cefepime 11/26 Imipenem 11/27 > Vanc 11/26 > 11/28  DVT prophylaxis: SCDs  Objective: Blood pressure 150/55, pulse 88, temperature 99 F (37.2 C), temperature  source Axillary, resp. rate 18, height 6\' 5"  (1.956 m), weight 135.172 kg (298 lb), SpO2 95 %.  Intake/Output Summary (Last 24 hours) at 05/13/14 0911 Last data filed at 05/13/14 0400  Gross per 24 hour  Intake 2455.75 ml  Output    225 ml  Net 2230.75 ml   Exam: General: No acute respiratory distress Lungs: Clear to auscultation bilaterally without wheezes or crackles Cardiovascular: Regular rate and rhythm without murmur gallop or rub Abdomen: Nontender, morbidly obese, soft, bowel sounds positive, no rebound, no ascites, no appreciable mass Extremities: No significant cyanosis, clubbing, or edema bilateral lower extremities  Data Reviewed: Basic Metabolic Panel:  Recent Labs Lab 05/09/14 0755 05/09/14 1300 05/09/14 2037 05/10/14 0046 05/11/14 0620 05/12/14 0223 05/13/14 0230  NA 135*  --  135*  --  139 136* 134*  K 4.0  --  4.6  --  4.6 4.3 4.6  CL 99  --  105  --  109 104 103  CO2 18*  --  16*  --  18* 16* 17*  GLUCOSE 38*  --  274*  --  275* 157* 320*  BUN 33*  --  30*  --  29* 16 17  CREATININE 1.44* 1.22 1.27  --  1.13 0.94 1.14  CALCIUM 9.2  --  8.0*  --  8.2* 8.8 8.6  MG  --   --   --  1.2*  --  1.3*  --     Liver Function Tests:  Recent Labs Lab 05/09/14 0755 05/09/14 2037 05/13/14 0230  AST 18 12 13   ALT 15 12 12   ALKPHOS 63 59 57  BILITOT 0.7 0.5 0.6  PROT 6.9 5.6* 5.4*  ALBUMIN 2.7* 2.0* 1.7*    Recent Labs Lab 05/13/14 0230  LIPASE 18   Coags: No results for input(s): INR in the last 168 hours.  Invalid input(s): PT No results for input(s): APTT in the last 168 hours.  CBC:  Recent Labs Lab 05/09/14 0755  05/09/14 2037  05/11/14 0620 05/11/14 0906 05/11/14 1757 05/12/14 0223 05/12/14 1906 05/13/14 0230  WBC 26.9*  < > 17.7*  --  17.5*  --  21.6* 19.2* 23.8* 21.4*  NEUTROABS 21.5*  --  14.6*  --   --   --   --   --   --   --   HGB 12.1*  < > 10.5*  < > 7.8* 8.9* 10.1* 9.1* 8.3* 8.7*  HCT 35.6*  < > 31.1*  < > 23.5* 26.2*  30.1* 27.6* 24.6* 26.2*  MCV 96.2  < > 94.8  --  94.0  --  95.6 93.9 92.5 92.9  PLT 337  < > 254  --  234  --  236 241 261 246  < > = values in this interval not displayed.  Cardiac Enzymes:  Recent Labs Lab 05/09/14 1300 05/09/14 1850 05/10/14 0046  TROPONINI <0.30 <0.30 <0.30   BNP (last 3 results)  Recent Labs  01/12/14 1244  PROBNP 67.7    CBG:  Recent Labs Lab 05/12/14 1115 05/12/14 1355 05/12/14 1626 05/12/14 2122 05/13/14 0746  GLUCAP 146*  192* 342* 291* 286*    Recent Results (from the past 240 hour(s))  Blood Culture (routine x 2)     Status: None (Preliminary result)   Collection Time: 05/09/14  9:10 AM  Result Value Ref Range Status   Specimen Description BLOOD LEFT FOREARM  Final   Special Requests BOTTLES DRAWN AEROBIC AND ANAEROBIC 5CC  Final   Culture  Setup Time   Final    05/09/2014 18:03 Performed at [facility]    Culture   Final           BLOOD CULTURE RECEIVED NO GROWTH TO DATE CULTURE WILL BE HELD FOR 5 DAYS BEFORE ISSUING A FINAL NEGATIVE REPORT Performed at [facility]    Report Status PENDING  Incomplete  Urine culture     Status: None   Collection Time: 05/09/14  9:55 AM  Result Value Ref Range Status   Specimen Description URINE, CLEAN CATCH  Final   Special Requests NONE  Final   Culture  Setup Time   Final    05/09/2014 18:23 Performed at [facility] Performed at [facility]   Final   Culture NO GROWTH Performed at [facility]   Final   Report Status 05/10/2014 FINAL  Final  Blood culture (routine x 2)     Status: None (Preliminary result)   Collection Time: 05/09/14 11:40 AM  Result Value Ref Range Status   Specimen Description BLOOD LEFT HAND  Final   Special Requests BOTTLES DRAWN AEROBIC ONLY 3CCS  Final   Culture  Setup Time   Final    05/09/2014 18:03 Performed at [facility]    Culture   Final           BLOOD CULTURE RECEIVED NO  GROWTH TO DATE CULTURE WILL BE HELD FOR 5 DAYS BEFORE ISSUING A FINAL NEGATIVE REPORT Performed at [facility]    Report Status PENDING  Incomplete  MRSA PCR Screening     Status: None   Collection Time: 05/11/14  6:48 AM  Result Value Ref Range Status   MRSA by PCR NEGATIVE NEGATIVE Final    Comment:        The GeneXpert MRSA Assay (FDA approved for NASAL specimens only), is one component of a comprehensive MRSA colonization surveillance program. It is not intended to diagnose MRSA infection nor to guide or monitor treatment for MRSA infections.      Studies:  Recent x-ray studies have been reviewed in detail by the Attending Physician  Scheduled Meds:  Scheduled Meds: . antiseptic oral rinse  7 mL Mouth Rinse BID  . atorvastatin  20 mg Oral QHS  . gabapentin  200 mg Oral QHS  . imipenem-cilastatin  500 mg Intravenous 4 times per day  . insulin aspart  0-20 Units Subcutaneous TID WC  . insulin aspart  0-5 Units Subcutaneous QHS  . insulin glargine  25 Units Subcutaneous BID  . metoprolol  50 mg Oral BID  . pantoprazole (PROTONIX) IV  40 mg Intravenous Q12H  . sodium chloride  3 mL Intravenous Q12H    Time spent on care of this patient: 35 mins   Lexany Belknap T , MD   [facility] Office  [phone] Pager - Text Page per Shea Evans as per below:  On-Call/Text Page:      [email or web address]      password [email or web address]  If 7PM-7AM, please contact night-coverage [email or web address] Password [email or web address] 05/13/2014,  9:11 AM   LOS: 4 days

## 2014-05-13 NOTE — Progress Notes (Signed)
Progress Note for [facility]  Subjective: Feeling well.  No complaints.  Objective: Vital signs in last 24 hours: Temp:  [99 F (37.2 C)-101.5 F (38.6 C)] 99 F (37.2 C) (11/29 0748) Pulse Rate:  [83-94] 88 (11/29 0307) Resp:  [18-36] 18 (11/29 0748) BP: (130-178)/(46-75) 150/55 mmHg (11/29 0748) SpO2:  [95 %-98 %] 95 % (11/29 0748) Last BM Date: 05/11/14  Intake/Output from previous day: 11/28 0701 - 11/29 0700 In: 2791.8 [P.O.:1340; I.V.:1051.8; IV Piggyback:400] Out: 225 [Urine:225] Intake/Output this shift:    General appearance: alert and no distress GI: soft, non-tender; bowel sounds normal; no masses,  no organomegaly  Lab Results:  Recent Labs  05/12/14 0223 05/12/14 1906 05/13/14 0230  WBC 19.2* 23.8* 21.4*  HGB 9.1* 8.3* 8.7*  HCT 27.6* 24.6* 26.2*  PLT 241 261 246   BMET  Recent Labs  05/11/14 0620 05/12/14 0223 05/13/14 0230  NA 139 136* 134*  K 4.6 4.3 4.6  CL 109 104 103  CO2 18* 16* 17*  GLUCOSE 275* 157* 320*  BUN 29* 16 17  CREATININE 1.13 0.94 1.14  CALCIUM 8.2* 8.8 8.6   LFT  Recent Labs  05/13/14 0230  PROT 5.4*  ALBUMIN 1.7*  AST 13  ALT 12  ALKPHOS 57  BILITOT 0.6   PT/INR No results for input(s): LABPROT, INR in the last 72 hours. Hepatitis Panel No results for input(s): HEPBSAG, HCVAB, HEPAIGM, HEPBIGM in the last 72 hours. C-Diff No results for input(s): CDIFFTOX in the last 72 hours. Fecal Lactopherrin No results for input(s): FECLLACTOFRN in the last 72 hours.  Studies/Results: No results found.  Medications:  Scheduled: . antiseptic oral rinse  7 mL Mouth Rinse BID  . atorvastatin  20 mg Oral QHS  . [START ON 05/14/2014] doxazosin  2 mg Oral QAC breakfast  . gabapentin  200 mg Oral QHS  . imipenem-cilastatin  500 mg Intravenous 4 times per day  . insulin aspart  0-20 Units Subcutaneous TID WC  . insulin aspart  0-5 Units Subcutaneous QHS  . insulin aspart  6 Units Subcutaneous TID WC  . insulin  glargine  40 Units Subcutaneous BID  . lisinopril  10 mg Oral Daily  . metoprolol  50 mg Oral BID  . pantoprazole (PROTONIX) IV  40 mg Intravenous Q12H  . sodium chloride  10-40 mL Intracatheter Q12H  . sodium chloride  3 mL Intravenous Q12H   Continuous: . sodium chloride 50 mL/hr at 05/12/14 1800    Assessment/Plan: 1) Duodenal mass - ? Invasion from pancreatic head cancer. 2) Probable severe bleed from the mass.   HGB stable.  No further hematochezia or melena.  I just confirmed this fact with Nursing as the patient just had a bowel movement.    Plan: 1) Await biopsy results. 2) Transfuse if necessary. 3) I will discuss the case with [facility] in the AM to determine the next course of work up.    LOS: 4 days   Naol Ontiveros D 05/13/2014, 10:09 AM

## 2014-05-14 ENCOUNTER — Encounter (HOSPITAL_COMMUNITY): Payer: Self-pay | Admitting: Gastroenterology

## 2014-05-14 DIAGNOSIS — K921 Melena: Secondary | ICD-10-CM

## 2014-05-14 LAB — GLUCOSE, CAPILLARY
GLUCOSE-CAPILLARY: 316 mg/dL — AB (ref 70–99)
Glucose-Capillary: 300 mg/dL — ABNORMAL HIGH (ref 70–99)
Glucose-Capillary: 383 mg/dL — ABNORMAL HIGH (ref 70–99)

## 2014-05-14 LAB — COMPREHENSIVE METABOLIC PANEL
ALBUMIN: 1.6 g/dL — AB (ref 3.5–5.2)
ALT: 11 U/L (ref 0–53)
ANION GAP: 12 (ref 5–15)
AST: 17 U/L (ref 0–37)
Alkaline Phosphatase: 63 U/L (ref 39–117)
BUN: 21 mg/dL (ref 6–23)
CALCIUM: 8.3 mg/dL — AB (ref 8.4–10.5)
CO2: 20 mEq/L (ref 19–32)
CREATININE: 1.12 mg/dL (ref 0.50–1.35)
Chloride: 103 mEq/L (ref 96–112)
GFR calc Af Amer: 74 mL/min — ABNORMAL LOW (ref >90)
GFR calc non Af Amer: 64 mL/min — ABNORMAL LOW (ref >90)
Glucose, Bld: 305 mg/dL — ABNORMAL HIGH (ref 70–99)
Potassium: 4.5 mEq/L (ref 3.7–5.3)
Sodium: 135 mEq/L — ABNORMAL LOW (ref 137–147)
TOTAL PROTEIN: 5 g/dL — AB (ref 6.0–8.3)
Total Bilirubin: 0.4 mg/dL (ref 0.3–1.2)

## 2014-05-14 LAB — PROTIME-INR
INR: 1.24 (ref 0.00–1.49)
Prothrombin Time: 15.7 seconds — ABNORMAL HIGH (ref 11.6–15.2)

## 2014-05-14 LAB — CBC
HEMATOCRIT: 22 % — AB (ref 39.0–52.0)
Hemoglobin: 7.3 g/dL — ABNORMAL LOW (ref 13.0–17.0)
MCH: 30.8 pg (ref 26.0–34.0)
MCHC: 33.2 g/dL (ref 30.0–36.0)
MCV: 92.8 fL (ref 78.0–100.0)
Platelets: 259 10*3/uL (ref 150–400)
RBC: 2.37 MIL/uL — ABNORMAL LOW (ref 4.22–5.81)
RDW: 13.3 % (ref 11.5–15.5)
WBC: 20 10*3/uL — ABNORMAL HIGH (ref 4.0–10.5)

## 2014-05-14 LAB — MAGNESIUM: Magnesium: 1.6 mg/dL (ref 1.5–2.5)

## 2014-05-14 LAB — APTT: APTT: 26 s (ref 24–37)

## 2014-05-14 MED ORDER — INSULIN GLARGINE 100 UNIT/ML ~~LOC~~ SOLN
45.0000 [IU] | Freq: Two times a day (BID) | SUBCUTANEOUS | Status: DC
  Administered 2014-05-14: 45 [IU] via SUBCUTANEOUS
  Filled 2014-05-14 (×2): qty 0.45

## 2014-05-14 MED ORDER — INSULIN ASPART 100 UNIT/ML ~~LOC~~ SOLN
10.0000 [IU] | Freq: Three times a day (TID) | SUBCUTANEOUS | Status: DC
  Administered 2014-05-14 – 2014-05-15 (×5): 10 [IU] via SUBCUTANEOUS

## 2014-05-14 MED ORDER — ACETAMINOPHEN 325 MG PO TABS
650.0000 mg | ORAL_TABLET | Freq: Once | ORAL | Status: AC
  Administered 2014-05-14: 650 mg via ORAL

## 2014-05-14 MED ORDER — PANTOPRAZOLE SODIUM 40 MG PO TBEC
40.0000 mg | DELAYED_RELEASE_TABLET | Freq: Two times a day (BID) | ORAL | Status: DC
  Administered 2014-05-14 – 2014-05-19 (×9): 40 mg via ORAL
  Filled 2014-05-14 (×9): qty 1

## 2014-05-14 NOTE — Progress Notes (Signed)
[facility] TEAM 1 - Stepdown/ICU TEAM Progress Note  RESHARD GUILLET JXB:[id number] DOB: Oct 15, 1940 DOA: 05/09/2014 PCP: [name], MD  Admit HPI / Brief Narrative: 73 year old male with known history of obstructive sleep apnea, CAD/remote PCI, diastolic CHF, remote SVT status post ablation, and recent plans for carpal tunnel surgery canceled due to poorly controlled blood sugar who presented to the ED due to palpitations, chest pain, and dizziness.  The patient was found to be orthostatic.  His cardiac enzymes were negative.  During his first night in the hospital (05/10/16) the patient spiked a fever over 103.  He denied cough, had no dysuria, chest x-ray was negative, urinalysis was negative, and leukocytosis on admission of 26K had improved to 17K.  He was started empirically on IV vancomycin and cefepime pending septic workup.  CT chest abdomen and pelvis was significant for a mass in the region of the pancreatic head.  He was seen by GI.  Antibiotics were stopped on 1127/15 as no focus of infection could be identified.  During his second night in the hospital (05/11/14) the patient developed hematochezia (known to have a history of diverticulosis) w/ a drop off his hemoglobin from 10.9 on admission to 7.9.  He was transfused packed red blood cells.  Additionally, the patient spiked a fever again, w/ worsening leukocytosis, so empiric IV antibiotic coverage was resumed.  And EGD was accomplished 11/28, revealing a signif tumor impinging upon the duodenum.  Bxs were obtained, but bleeding limited the extent of the tissue sampling.  Path results are currently pending.    HPI/Subjective: No new complaints today.  States his dizziness has resolved.  Many questions about w/u of abdom mass discussed in detail.      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) - plan to remain in hospital until diagnosis and tx plan are complete, as time is of the essence   GI bleed - Acute painless hematochezia -2 episodes of BRBPR -  Aspirin held - Lovenox stopped -Significant drop in hemoglobin 10.9 > 7.8 - transfused one unit packed red blood cell -Hgb has dropped again, but w/o evidence of recurrent bleeding - transfuse an additonal unit of blood today and follow trend  -tumor itself was ulcerated, and likely the source of his blood loss   Acute blood loss anemia  -due to GIB  -transfused 2U PRBC thus far (including unit to be given today)  SIRS - FUO -Tmax 103 05/10/14 w/ leukocytosis of 17,000 - fever curve trending down  -UA negative - CXR no acute finding - flu PCR negative  -blood cultures remain negative so far -recheck CXR 11/29 w/o infiltrate -empirically on primaxin, though we have no idea what, if anything, we are treating  -?due to mass/CA - ?suggestive of lymphoma   Mixed hyperlipidemia - Continue statin for now   Diabetes mellitus -severely uncontrolled - required IV glucose stabilizer at admission - gtt stopped 11/28 - CBG poorly controlled - adjust tx further today and follow - A1c 9.8  Chronic diastolic CHF  -Clinically compensated - watch carefully for volume overload on IV fluid resuscitation - slow IVF  today   CORONARY ATHEROSCLEROSIS NATIVE CORONARY ARTERY -currently denies chest pain - negative troponin 3  Hypomagnesemia -replaced - recheck in AM and follow - K+ is normal   Acute renal failure -resolved w/ volume resuscitation   OSA  Obesity - Body mass index is 35.33 kg/(m^2).  Code Status: FULL Family Communication: spoke w/ multiple family members at bedside  Disposition Plan:  stable for transfer to medical bed - await path results for further planning   Consultants: GI Cardiology   Procedures: EGD - 11/28 - results noted above   Antibiotics: Cefepime 11/26 Imipenem 11/27 > Vanc 11/26 > 11/28  DVT prophylaxis: SCDs  Objective: Blood pressure 136/52, pulse 83, temperature 99.2 F (37.3 C), temperature source Oral, resp. rate 22, height 6\' 5"  (1.956 m), weight 135.172 kg (298 lb), SpO2 95 %.  Intake/Output Summary (Last 24 hours) at 05/14/14 1046 Last data filed at 05/14/14 0800  Gross per 24 hour  Intake   1480 ml  Output    700 ml  Net    780 ml   Exam: General: No acute respiratory distress - alert and oriented  Lungs: Clear to auscultation bilaterally without wheezes or crackles Cardiovascular: Regular rate and rhythm without murmur gallop or rub Abdomen: Nontender, morbidly obese, soft, bowel sounds positive, no rebound, no ascites, no appreciable mass Extremities: No significant cyanosis, clubbing, edema bilateral lower extremities  Data Reviewed: Basic Metabolic Panel:  Recent Labs Lab 05/09/14 2037 05/10/14 0046 05/11/14 0620 05/12/14 0223 05/13/14 0230 05/14/14 0409  NA 135*  --  139 136* 134* 135*  K 4.6  --  4.6 4.3 4.6 4.5  CL 105  --  109 104 103 103  CO2 16*  --  18* 16* 17* 20  GLUCOSE 274*  --  275* 157* 320* 305*  BUN 30*  --  29* 16 17 21   CREATININE 1.27  --  1.13 0.94 1.14 1.12  CALCIUM 8.0*  --  8.2* 8.8 8.6 8.3*  MG  --  1.2*  --  1.3*  --  1.6    Liver Function Tests:  Recent Labs Lab 05/09/14 0755 05/09/14 2037 05/13/14 0230 05/14/14 0409  AST 18 12 13 17   ALT 15 12 12 11   ALKPHOS 63 59 57 63  BILITOT 0.7 0.5 0.6 0.4  PROT 6.9 5.6* 5.4* 5.0*  ALBUMIN 2.7* 2.0* 1.7* 1.6*    Recent Labs Lab 05/13/14 0230  LIPASE 18   Coags:  Recent Labs Lab 05/14/14 0409  INR 1.24    Recent Labs Lab 05/14/14 0409  APTT 26    CBC:  Recent Labs Lab 05/09/14 0755  05/09/14 2037  05/11/14 1757  05/12/14 0223 05/12/14 1906 05/13/14 0230 05/14/14 0409  WBC 26.9*  < > 17.7*  < > 21.6* 19.2* 23.8* 21.4* 20.0*  NEUTROABS 21.5*  --  14.6*  --   --   --   --   --   --   HGB 12.1*  < > 10.5*  < > 10.1* 9.1* 8.3* 8.7* 7.3*  HCT 35.6*  < > 31.1*  < > 30.1* 27.6* 24.6* 26.2* 22.0*  MCV 96.2  < > 94.8  < > 95.6 93.9 92.5 92.9 92.8  PLT 337  < > 254  < > 236 241 261 246 259  < > = values in this interval not displayed.  Cardiac Enzymes:  Recent Labs Lab 05/09/14 1300 05/09/14 1850 05/10/14 0046  TROPONINI <0.30 <0.30 <0.30   BNP (last 3  results)  Recent Labs  01/12/14 1244  PROBNP 67.7    CBG:  Recent Labs Lab 05/12/14 2122 05/13/14 0746 05/13/14 1148 05/13/14 1525 05/14/14 0808  GLUCAP 291* 286* 337* 315* 300*    Recent Results (from the past 240 hour(s))  Blood Culture (routine x 2)     Status: None (Preliminary result)   Collection Time: 05/09/14  9:10 AM  Result Value Ref Range Status   Specimen Description BLOOD LEFT FOREARM  Final   Special Requests BOTTLES DRAWN AEROBIC AND ANAEROBIC 5CC  Final   Culture  Setup Time   Final    05/09/2014 18:03 Performed at [facility]    Culture   Final           BLOOD CULTURE RECEIVED NO GROWTH TO DATE CULTURE WILL BE HELD FOR 5 DAYS BEFORE ISSUING A FINAL NEGATIVE REPORT Performed at [facility]    Report Status PENDING  Incomplete  Urine culture     Status: None   Collection Time: 05/09/14  9:55 AM  Result Value Ref Range Status   Specimen Description URINE, CLEAN CATCH  Final   Special Requests NONE  Final   Culture  Setup Time   Final    05/09/2014 18:23 Performed at [place] Performed at [facility]   Final   Culture NO GROWTH Performed at [facility]   Final   Report Status 05/10/2014 FINAL  Final  Blood culture (routine x 2)     Status: None (Preliminary result)   Collection Time: 05/09/14 11:40 AM  Result Value Ref Range  Status   Specimen Description BLOOD LEFT HAND  Final   Special Requests BOTTLES DRAWN AEROBIC ONLY 3CCS  Final   Culture  Setup Time   Final    05/09/2014 18:03 Performed at [facility]    Culture   Final           BLOOD CULTURE RECEIVED NO GROWTH TO DATE CULTURE WILL BE HELD FOR 5 DAYS BEFORE ISSUING A FINAL NEGATIVE REPORT Performed at [facility]    Report Status PENDING  Incomplete  MRSA PCR Screening     Status: None   Collection Time: 05/11/14  6:48 AM  Result Value Ref Range Status   MRSA by PCR NEGATIVE NEGATIVE Final    Comment:        The GeneXpert MRSA Assay (FDA approved for NASAL specimens only), is one component of a comprehensive MRSA colonization surveillance program. It is not intended to diagnose MRSA infection nor to guide or monitor treatment for MRSA infections.      Studies:  Recent x-ray studies have been reviewed in detail by the Attending Physician  Scheduled Meds:  Scheduled Meds: . antiseptic oral rinse  7 mL Mouth Rinse BID  . atorvastatin  20 mg Oral QHS  . doxazosin  2 mg Oral QAC breakfast  . gabapentin  200 mg Oral QHS  . imipenem-cilastatin  500 mg Intravenous 4 times per day  . insulin aspart  0-20 Units Subcutaneous TID WC  . insulin aspart  0-5 Units Subcutaneous QHS  . insulin aspart  6 Units Subcutaneous TID WC  . insulin glargine  40 Units Subcutaneous BID  . lisinopril  10 mg Oral Daily  . metoprolol  50 mg Oral BID  . pantoprazole  40 mg Oral BID  . sodium chloride  10-40 mL Intracatheter Q12H  . sodium chloride  3 mL Intravenous Q12H    Time spent on care of this patient: 35 mins   Suzi Hernan T , MD   [facility] Office  [phone] Pager - Text Page per Shea Evans as per below:  On-Call/Text Page:      [email or web address]      password [email or web address]  If 7PM-7AM, please contact night-coverage [email or web address] Password [email or web address] 05/14/2014, 10:46 AM   LOS: 5 days

## 2014-05-14 NOTE — Progress Notes (Signed)
Physical Therapy Treatment Patient Details Name: MARKHAM DUMLAO MRN: [id number] DOB: 01-Jan-1941 Today's Date: 05-20-2014    History of Present Illness 73 y.o. male admitted with Palpitations/chest pain/shortness of breath, and orthostatic hypotension. Pt had GI bleed and found to have mass in duodenum and biopsy pending.    PT Comments    Pt making steady progress after bleed.  Follow Up Recommendations  No PT follow up;Supervision for mobility/OOB     Equipment Recommendations  Rolling walker with 5" wheels    Recommendations for Other Services       Precautions / Restrictions Precautions Precautions: Fall    Mobility  Bed Mobility                  Transfers Overall transfer level: Needs assistance Equipment used: Rolling walker (2 wheeled) Transfers: Sit to/from Stand Sit to Stand: Min guard         General transfer comment: Assist for balance  Ambulation/Gait Ambulation/Gait assistance: Min assist Ambulation Distance (Feet): 90 Feet Assistive device: Rolling walker (2 wheeled) Gait Pattern/deviations: Step-through pattern;Wide base of support   Gait velocity interpretation: Below normal speed for age/gender General Gait Details: Unsteady but able to use walker to help stabilize. Pt kicked leg of walker several times due to wide base.   Stairs            Wheelchair Mobility    Modified Rankin (Stroke Patients Only)       Balance Overall balance assessment: Needs assistance Sitting-balance support: No upper extremity supported;Feet supported Sitting balance-Leahy Scale: Good     Standing balance support: No upper extremity supported Standing balance-Leahy Scale: Fair                      Cognition Arousal/Alertness: Awake/alert Behavior During Therapy: WFL for tasks assessed/performed Overall Cognitive Status: Within Functional Limits for tasks assessed                      Exercises      General Comments         Pertinent Vitals/Pain Pain Assessment: No/denies pain    Home Living                      Prior Function            PT Goals (current goals can now be found in the care plan section) Progress towards PT goals: Progressing toward goals    Frequency  Min 3X/week    PT Plan Current plan remains appropriate    Co-evaluation             End of Session Equipment Utilized During Treatment: Gait belt Activity Tolerance: Patient tolerated treatment well Patient left: in chair;with call bell/phone within reach;with family/visitor present     Time: 2707-8675 PT Time Calculation (min) (ACUTE ONLY): 11 min  Charges:  $Gait Training: 8-22 mins                    G Codes:      Aariah Godette 20-May-2014, 12:45 PM  [employer] PT [phone]

## 2014-05-14 NOTE — Progress Notes (Signed)
Inpatient Diabetes Program Recommendations  AACE/ADA: New Consensus Statement on Inpatient Glycemic Control (2013)  Target Ranges:  Prepandial:   less than 140 mg/dL      Peak postprandial:   less than 180 mg/dL (1-2 hours)      Critically ill patients:  140 - 180 mg/dL   Reason for Assessment:  Results for LUX, MEADERS (MRN [id number]) as of 05/14/2014 10:01  Ref. Range 05/12/2014 21:22 05/13/2014 07:46 05/13/2014 11:48 05/13/2014 15:25 05/14/2014 08:08  Glucose-Capillary Latest Range: 70-99 mg/dL 291 (H) 286 (H) 337 (H) 315 (H) 300 (H)    Diabetes history: Type 2 diabetes Outpatient Diabetes medications: 70/30- 70 units AM, and 90 units PM Current orders for Inpatient glycemic control:  Lantus 40 units bid, Novolog 6 units tid with meals., Novolog resistant tid with meals and HS  Consider increasing Lantus to 46 units bid and increase Novolog correction to q 4 hours.    Will follow.  Adah Perl, RN, BC-ADM Inpatient Diabetes Coordinator Pager [phone]

## 2014-05-14 NOTE — Progress Notes (Signed)
ANTIBIOTIC CONSULT NOTE - FOLLOW UP  Pharmacy Consult: Primaxin Indication:  Sepsis  Allergies  Allergen Reactions  . Clindamycin/Lincomycin Other (See Comments)    dysphagia  . Codeine Itching  . Penicillins Rash    Patient Measurements: Height: 6\' 5"  (195.6 cm) Weight: 298 lb (135.172 kg) (scale b) IBW/kg (Calculated) : 89.1  Vital Signs: Temp: 99.2 F (37.3 C) (11/30 0807) Temp Source: Oral (11/30 0807) BP: 136/52 mmHg (11/30 0807) Pulse Rate: 83 (11/30 0807) Intake/Output from previous day: 11/29 0701 - 11/30 0700 In: 2020 [P.O.:1320; I.V.:300; IV Piggyback:400] Out: 500 [Urine:500]  Labs:  Recent Labs  05/12/14 0223 05/12/14 1906 05/13/14 0230 05/14/14 0409  WBC 19.2* 23.8* 21.4* 20.0*  HGB 9.1* 8.3* 8.7* 7.3*  PLT 241 261 246 259  CREATININE 0.94  --  1.14 1.12   Estimated Creatinine Clearance: 90.6 mL/min (by C-G formula based on Cr of 1.12). No results for input(s): VANCOTROUGH, VANCOPEAK, VANCORANDOM, GENTTROUGH, GENTPEAK, GENTRANDOM, TOBRATROUGH, TOBRAPEAK, TOBRARND, AMIKACINPEAK, AMIKACINTROU, AMIKACIN in the last 72 hours.   Microbiology: Recent Results (from the past 720 hour(s))  Blood Culture (routine x 2)     Status: None (Preliminary result)   Collection Time: 05/09/14  9:10 AM  Result Value Ref Range Status   Specimen Description BLOOD LEFT FOREARM  Final   Special Requests BOTTLES DRAWN AEROBIC AND ANAEROBIC 5CC  Final   Culture  Setup Time   Final    05/09/2014 18:03 Performed at [facility]    Culture   Final           BLOOD CULTURE RECEIVED NO GROWTH TO DATE CULTURE WILL BE HELD FOR 5 DAYS BEFORE ISSUING A FINAL NEGATIVE REPORT Performed at [facility]    Report Status PENDING  Incomplete  Urine culture     Status: None   Collection Time: 05/09/14  9:55 AM  Result Value Ref Range Status   Specimen Description URINE, CLEAN CATCH  Final   Special Requests NONE  Final   Culture  Setup Time   Final    05/09/2014  18:23 Performed at [place] Performed at [facility]   Final   Culture NO GROWTH Performed at [facility]   Final   Report Status 05/10/2014 FINAL  Final  Blood culture (routine x 2)     Status: None (Preliminary result)   Collection Time: 05/09/14 11:40 AM  Result Value Ref Range Status   Specimen Description BLOOD LEFT HAND  Final   Special Requests BOTTLES DRAWN AEROBIC ONLY 3CCS  Final   Culture  Setup Time   Final    05/09/2014 18:03 Performed at [facility]    Culture   Final           BLOOD CULTURE RECEIVED NO GROWTH TO DATE CULTURE WILL BE HELD FOR 5 DAYS BEFORE ISSUING A FINAL NEGATIVE REPORT Performed at [facility]    Report Status PENDING  Incomplete  MRSA PCR Screening     Status: None   Collection Time: 05/11/14  6:48 AM  Result Value Ref Range Status   MRSA by PCR NEGATIVE NEGATIVE Final    Comment:        The GeneXpert MRSA Assay (FDA approved for NASAL specimens only), is one component of a comprehensive MRSA colonization surveillance program. It is not intended to diagnose MRSA infection nor to guide or monitor treatment for MRSA infections.     Medical  History: Past Medical History  Diagnosis Date  . Diabetes mellitus   . Hyperlipidemia   . Hypertension   . GERD (gastroesophageal reflux disease)   . Coronary artery disease     a. S/P prior PTCA in the 90's;  b. 04/2007 Cath: LM nl, LAD Ca2+ prox, LCX nl, RI nl, OM nl, RCA 40-50p, 40d, EF 60%;  b. 09/2012 Echo: EF 55-60%, mild LVH, nl wall motion, Gr 1 DD, mildly dil LA.  . Right ureteral stone   . SVT (supraventricular tachycardia)     a. s/p ablation per Dr. Lovena Le 10 -56yrs ago  . OSA on CPAP     a. cpap setting of 16  . Arthritis   . Bilateral kidney stones   . Shortness of breath dyspnea   . CKD (chronic kidney disease), stage III      Assessment: 73 y/o male who presented to the ED on 11/25 with CP, SOB,  orthostatic hypotension for 2 weeks and persistent leukocytosis. Antibiotics were not started initially, but pharmacy ultimately consulted to start vancomycin and imipenem.     AC: Hold anticoag due to bleeding from mass in GI tract, no bleeding 11/28 Heme/Onc: H/H/ dropping 7.3/22 (possible transfusion 11/30).  Duodenal mass, biopsy pending, concern for CA (pancreatic vs. Lymphoma)  ID: r/o sepsis, no source identified, CXR and UA are neg. afeb currently (Tm 100.8), WBC still elevated. They restarted abx after recent fevers. Just want to cont primaxin for now. Not sure where the source is for the fever.   Vanc 11/26 >> 11/27, resumed 11/27 >>11/28 Cefepime 11/26 >> 11/27 Primaxin 11/27 >>  11/25 BCx2 -  11/26 urine>>neg  Renal: CKD, CrCl 90 ml/min,  (UOP incomplete) Home medications not ordered:   lasix, 70/30, metformin, KCl  Goal of Therapy:  Renal adjustment of antibiotics.  Plan:  - Primaxin 500mg  IV Q8H - Follow up SCr, UOP, cultures, clinical course and adjust as clinically indicated.   Thank you for allowing pharmacy to be a part of this patients care team.  Rowe Robert Pharm.D., BCPS, AQ-Cardiology Clinical Pharmacist 05/14/2014 8:38 AM Pager: [phone] Phone: [phone]

## 2014-05-14 NOTE — Progress Notes (Signed)
Daily Rounding Note  05/14/2014, 8:23 AM  LOS: 5 days   SUBJECTIVE:       Stools are dark, black, not frankly bloody as they were last week. Unsteady on feet, but not dizzy.  No nausea, no abdominal pain.  Tolerating solids. Wondering about next step.   OBJECTIVE:         Vital signs in last 24 hours:    Temp:  [97.3 F (36.3 C)-100.8 F (38.2 C)] 99.2 F (37.3 C) (11/30 0807) Pulse Rate:  [78-88] 83 (11/30 0807) Resp:  [18-22] 22 (11/30 0511) BP: (117-156)/(48-64) 136/52 mmHg (11/30 0807) SpO2:  [94 %-96 %] 95 % (11/30 0807) Last BM Date: 05/13/14 Filed Weights   05/09/14 0724 05/09/14 1236 05/10/14 0559  Weight: 290 lb (131.543 kg) 296 lb 4.8 oz (134.4 kg) 298 lb (135.172 kg)   General: looks chronically unwell.  Ashen/pale coloring    Heart: Regular, rate in 30s, some paced beats but lots of artifact on monitor Chest: no dyspnea.  Clear but reduced BS.  No cough Abdomen: soft, obese, NT.  Active BS  Extremities: no pedal edema Neuro/Psych:  Oriented x 3.  No tremor.  No gross deficits.   Intake/Output from previous day: 11/29 0701 - 11/30 0700 In: 2020 [P.O.:1320; I.V.:300; IV Piggyback:400] Out: 500 [Urine:500]  Intake/Output this shift:    Lab Results:  Recent Labs  05/12/14 1906 05/13/14 0230 05/14/14 0409  WBC 23.8* 21.4* 20.0*  HGB 8.3* 8.7* 7.3*  HCT 24.6* 26.2* 22.0*  PLT 261 246 259   BMET  Recent Labs  05/12/14 0223 05/13/14 0230 05/14/14 0409  NA 136* 134* 135*  K 4.3 4.6 4.5  CL 104 103 103  CO2 16* 17* 20  GLUCOSE 157* 320* 305*  BUN 16 17 21   CREATININE 0.94 1.14 1.12  CALCIUM 8.8 8.6 8.3*   LFT  Recent Labs  05/13/14 0230 05/14/14 0409  PROT 5.4* 5.0*  ALBUMIN 1.7* 1.6*  AST 13 17  ALT 12 11  ALKPHOS 57 63  BILITOT 0.6 0.4   PT/INR  Recent Labs  05/14/14 0409  LABPROT 15.7*  INR 1.24   Hepatitis Panel No results for input(s): HEPBSAG, HCVAB,  HEPAIGM, HEPBIGM in the last 72 hours.  Studies/Results: Dg Chest Port 1 View  05/13/2014   CLINICAL DATA:  Fever, diabetes, hypertension  EXAM: PORTABLE CHEST - 1 VIEW  COMPARISON:  05/09/2014  FINDINGS: Stable cardiomegaly.  Atheromatous aorta.  Lungs are clear. Small left pleural effusion as before. Left arm PICC line to the cavoatrial junction. Regional bones unremarkable.  IMPRESSION: 1. Stable cardiomegaly and small left pleural effusion. 2. Left PICC line to cavoatrial junction.   Electronically Signed   By: Arne Cleveland M.D.   On: 05/13/2014 15:08   Scheduled Meds: . antiseptic oral rinse  7 mL Mouth Rinse BID  . atorvastatin  20 mg Oral QHS  . doxazosin  2 mg Oral QAC breakfast  . gabapentin  200 mg Oral QHS  . imipenem-cilastatin  500 mg Intravenous 4 times per day  . insulin aspart  0-20 Units Subcutaneous TID WC  . insulin aspart  0-5 Units Subcutaneous QHS  . insulin aspart  6 Units Subcutaneous TID WC  . insulin glargine  40 Units Subcutaneous BID  . lisinopril  10 mg Oral Daily  . metoprolol  50 mg Oral BID  . pantoprazole (PROTONIX) IV  40 mg Intravenous Q12H  . sodium chloride  10-40 mL Intracatheter Q12H  . sodium chloride  3 mL Intravenous Q12H   Continuous Infusions: . sodium chloride Stopped (05/13/14 2145)   PRN Meds:.acetaminophen, acetaminophen, albuterol, alum & mag hydroxide-simeth, dextrose, guaiFENesin-dextromethorphan, ondansetron (ZOFRAN) IV, ondansetron, sodium chloride   ASSESMENT:   *  GI bleed. Duodenal/pancretic head mass.  20 # weight loss.  05/12/14 EGD:  Compressing duodenal mass with central ulcer, bled upon shallow biopsy.  Not clear if this is primary duodenal vs extrinsic pancreatic head mass.  CA 19-9 is 11.6.  Awaiting the biopsy pathology, if inconclusive ? EUS/biospy vs CT guided biopsy. On BID IV Protonix.    *  Anemia.  Normal MCV.  Suspect due to ABL. Transfused PRBC x 1 on 11/27.   *  IDDM  *  Leukocytosis, fever, SIRS of  undetermined cause.  Remains on Primaxin.     PLAN   *  Await pathology report.  If inconclusive: CT with biospy vs EUS with biopsy.  Pt wants diagnoses as soon as possible.  *  Switch to po Protonix.  Leave at BID for now, though once daily probably sufficient in near future. Azucena Freed  05/14/2014, 8:23 AM Pager: [phone]     Attending physician's note   I have taken an interval history, reviewed the chart and examined the patient. I agree with the Advanced Practitioner's note, impression and recommendations. Awaiting pathology report from duodenal vs pancreatic mass. If biopsies are not diagnostic consider CT guided biopsy or EUS with biopsy. Transuse to try to keep Hb > 9.  Pricilla Riffle. Fuller Plan, MD [facility]

## 2014-05-14 NOTE — Progress Notes (Signed)
Pt had temp of 99.8 on transfer arrival this afternoon, temp currently 99.4, pt to receive 1 unit of PRBC, call placed to MD, Dr Thereasa Solo. (note pt has had fever off and on since admit). Await call back to discuss before transfusion starts.

## 2014-05-14 NOTE — Care Management Note (Addendum)
    Page 1 of 1   05/17/2014     11:12:45 AM CARE MANAGEMENT NOTE 05/17/2014  Patient:  [name], [name]   Account Number:  [id number]  Date Initiated:  05/14/2014  Documentation initiated by:  Elissa Hefty  Subjective/Objective Assessment:   adm w palpitations     Action/Plan:   lives w wife, pcp dr Derrill Memo   Anticipated DC Date:  05/16/2014   Anticipated DC Plan:  HOME/SELF CARE         Choice offered to / List presented to:             Status of service:   Medicare Important Message given?  YES (If response is "NO", the following Medicare IM given date fields will be blank) Date Medicare IM given:  05/14/2014 Medicare IM given by:  Elissa Hefty Date Additional Medicare IM given:  05/16/2014 Additional Medicare IM given by:  Allen  Discharge Disposition:    Per UR Regulation:  Reviewed for med. necessity/level of care/duration of stay  If discussed at [place] of Stay Meetings, dates discussed:   05/15/2014    Comments:

## 2014-05-14 NOTE — Progress Notes (Signed)
Return call, will premedicate pt with 650mg  tylenol before blood starts.

## 2014-05-14 NOTE — Progress Notes (Signed)
Pt placed on home CPAP machine for the night. Told to notify if anything else needed.

## 2014-05-15 LAB — TYPE AND SCREEN
ABO/RH(D): O POS
ANTIBODY SCREEN: NEGATIVE
UNIT DIVISION: 0
Unit division: 0

## 2014-05-15 LAB — CBC
HEMATOCRIT: 24.9 % — AB (ref 39.0–52.0)
HEMATOCRIT: 26.9 % — AB (ref 39.0–52.0)
HEMOGLOBIN: 8.4 g/dL — AB (ref 13.0–17.0)
Hemoglobin: 9.2 g/dL — ABNORMAL LOW (ref 13.0–17.0)
MCH: 30.8 pg (ref 26.0–34.0)
MCH: 31.6 pg (ref 26.0–34.0)
MCHC: 33.7 g/dL (ref 30.0–36.0)
MCHC: 34.2 g/dL (ref 30.0–36.0)
MCV: 91.2 fL (ref 78.0–100.0)
MCV: 92.4 fL (ref 78.0–100.0)
Platelets: 232 10*3/uL (ref 150–400)
Platelets: 237 10*3/uL (ref 150–400)
RBC: 2.73 MIL/uL — AB (ref 4.22–5.81)
RBC: 2.91 MIL/uL — ABNORMAL LOW (ref 4.22–5.81)
RDW: 14 % (ref 11.5–15.5)
RDW: 14.8 % (ref 11.5–15.5)
WBC: 16.2 10*3/uL — AB (ref 4.0–10.5)
WBC: 15.6 10*3/uL — ABNORMAL HIGH (ref 4.0–10.5)

## 2014-05-15 LAB — RENAL FUNCTION PANEL
ALBUMIN: 1.5 g/dL — AB (ref 3.5–5.2)
Anion gap: 12 (ref 5–15)
BUN: 21 mg/dL (ref 6–23)
CO2: 21 meq/L (ref 19–32)
CREATININE: 1.01 mg/dL (ref 0.50–1.35)
Calcium: 8.5 mg/dL (ref 8.4–10.5)
Chloride: 103 mEq/L (ref 96–112)
GFR calc Af Amer: 84 mL/min — ABNORMAL LOW (ref >90)
GFR, EST NON AFRICAN AMERICAN: 72 mL/min — AB (ref >90)
Glucose, Bld: 159 mg/dL — ABNORMAL HIGH (ref 70–99)
POTASSIUM: 3.9 meq/L (ref 3.7–5.3)
Phosphorus: 3.5 mg/dL (ref 2.3–4.6)
Sodium: 136 mEq/L — ABNORMAL LOW (ref 137–147)

## 2014-05-15 LAB — GLUCOSE, CAPILLARY
GLUCOSE-CAPILLARY: 159 mg/dL — AB (ref 70–99)
GLUCOSE-CAPILLARY: 255 mg/dL — AB (ref 70–99)
GLUCOSE-CAPILLARY: 267 mg/dL — AB (ref 70–99)
Glucose-Capillary: 230 mg/dL — ABNORMAL HIGH (ref 70–99)
Glucose-Capillary: 406 mg/dL — ABNORMAL HIGH (ref 70–99)

## 2014-05-15 LAB — URINALYSIS, ROUTINE W REFLEX MICROSCOPIC
Bilirubin Urine: NEGATIVE
Glucose, UA: 250 mg/dL — AB
Hgb urine dipstick: NEGATIVE
Ketones, ur: NEGATIVE mg/dL
Leukocytes, UA: NEGATIVE
Nitrite: NEGATIVE
Protein, ur: NEGATIVE mg/dL
Specific Gravity, Urine: 1.021 (ref 1.005–1.030)
Urobilinogen, UA: 0.2 mg/dL (ref 0.0–1.0)
pH: 5.5 (ref 5.0–8.0)

## 2014-05-15 LAB — CULTURE, BLOOD (ROUTINE X 2)
CULTURE: NO GROWTH
Culture: NO GROWTH

## 2014-05-15 LAB — PREPARE RBC (CROSSMATCH)

## 2014-05-15 MED ORDER — INSULIN GLARGINE 100 UNIT/ML ~~LOC~~ SOLN
46.0000 [IU] | Freq: Two times a day (BID) | SUBCUTANEOUS | Status: DC
  Administered 2014-05-15 – 2014-05-16 (×3): 46 [IU] via SUBCUTANEOUS
  Filled 2014-05-15 (×4): qty 0.46

## 2014-05-15 MED ORDER — SODIUM CHLORIDE 0.9 % IV SOLN
Freq: Once | INTRAVENOUS | Status: AC
  Administered 2014-05-15: 08:00:00 via INTRAVENOUS

## 2014-05-15 MED ORDER — FUROSEMIDE 10 MG/ML IJ SOLN
20.0000 mg | Freq: Once | INTRAMUSCULAR | Status: AC
  Administered 2014-05-15: 20 mg via INTRAVENOUS
  Filled 2014-05-15: qty 2

## 2014-05-15 MED ORDER — POLYETHYLENE GLYCOL 3350 17 G PO PACK
17.0000 g | PACK | Freq: Every day | ORAL | Status: DC
  Administered 2014-05-15 – 2014-05-17 (×2): 17 g via ORAL
  Filled 2014-05-15 (×3): qty 1

## 2014-05-15 MED ORDER — DOCUSATE SODIUM 100 MG PO CAPS
100.0000 mg | ORAL_CAPSULE | Freq: Two times a day (BID) | ORAL | Status: DC
  Administered 2014-05-15 – 2014-05-17 (×4): 100 mg via ORAL
  Filled 2014-05-15 (×6): qty 1

## 2014-05-15 MED ORDER — MAGNESIUM SULFATE 2 GM/50ML IV SOLN
2.0000 g | Freq: Once | INTRAVENOUS | Status: AC
  Administered 2014-05-15: 2 g via INTRAVENOUS
  Filled 2014-05-15 (×2): qty 50

## 2014-05-15 MED ORDER — LIDOCAINE 4 % EX CREA
TOPICAL_CREAM | Freq: Every day | CUTANEOUS | Status: DC | PRN
  Administered 2014-05-16: 1 via TOPICAL
  Filled 2014-05-15: qty 5

## 2014-05-15 MED ORDER — GLUCERNA SHAKE PO LIQD
237.0000 mL | Freq: Two times a day (BID) | ORAL | Status: DC
  Administered 2014-05-15: 237 mL via ORAL

## 2014-05-15 NOTE — Progress Notes (Signed)
Patients CBG 406. MD Gherghe notified. Telephone order to give 20 units of Novolog now.

## 2014-05-15 NOTE — Plan of Care (Signed)
Problem: Phase II Progression Outcomes Goal: Progress activity as tolerated unless otherwise ordered Outcome: Progressing Goal: Obtain order to discontinue catheter if appropriate Outcome: Not Applicable Date Met:  50/38/88  Problem: Phase III Progression Outcomes Goal: Pain controlled on oral analgesia Outcome: Not Applicable Date Met:  28/00/34 Goal: Foley discontinued Outcome: Not Applicable Date Met:  91/79/15  Problem: Discharge Progression Outcomes Goal: Pain controlled with appropriate interventions Outcome: Completed/Met Date Met:  05/15/14

## 2014-05-15 NOTE — Progress Notes (Signed)
NUTRITION FOLLOW UP  Intervention:   - Glucerna Shake po BID, each supplement provides 220 kcal and 10 grams of protein - Reviewed "Carbohydrate Counting for People with Diabetes" and answered questions.  - Educated pt on the importance of incorporating high-calories, high-protein foods to maintain optimal nutritional status.   Nutrition Dx:   Inadequate oral intake related to decreased appetite and diet restrictions as evidenced by reported 22 lb weight loss in past month; ongoing  Goal:   Pt to meet >/= 90% of their estimated nutrition needs; not met  Monitor:   Po intake, weight trend, labs  Assessment:   73 y.o. male with a Past Medical History of coronary artery disease, chronic diastolic heart failure, diabetes, hypertension, dyslipidemia who presents today with complaints of palpitations/chest pain/shortness of breath this morning.   - 05/12/14 EGD: Compressing duodenal mass with central ulcer, bled upon shallow biopsy. Not clear if this is primary duodenal vs extrinsic pancreatic head mass. Awaiting biopsy pathology.  - Pt had questions about his carbohydrate modified diet. He was concerned that some foods he was receiving may make his blood sugar increase. RD explained to pt that he is able to eat foods containing carbohydrates. He was offered nutritional supplements. Pt encouraged to incorporate high-calorie, high-protein foods into his diet if his appetite remains poor upon discharge. Pt reports that he currently feels hungry and his appetite has improved.   Height: Ht Readings from Last 1 Encounters:  05/09/14 $RemoveB'6\' 5"'EnJzOmzY$  (1.956 m)    Weight Status:   Wt Readings from Last 1 Encounters:  05/10/14 298 lb (135.172 kg)    Re-estimated needs:  Kcal: 2400-2650 Protein: 115-130 g Fluid: 2.7 L/day  Skin: intact  Diet Order: Diet Carb Modified   Intake/Output Summary (Last 24 hours) at 05/15/14 1545 Last data filed at 05/15/14 1410  Gross per 24 hour  Intake 1099.17 ml   Output      0 ml  Net 1099.17 ml    Last BM: 11/29   Labs:   Recent Labs Lab 05/10/14 0046  05/12/14 0223 05/13/14 0230 05/14/14 0409 05/15/14 0220  NA  --   < > 136* 134* 135* 136*  K  --   < > 4.3 4.6 4.5 3.9  CL  --   < > 104 103 103 103  CO2  --   < > 16* 17* 20 21  BUN  --   < > $R'16 17 21 21  'Wu$ CREATININE  --   < > 0.94 1.14 1.12 1.01  CALCIUM  --   < > 8.8 8.6 8.3* 8.5  MG 1.2*  --  1.3*  --  1.6  --   PHOS  --   --   --   --   --  3.5  GLUCOSE  --   < > 157* 320* 305* 159*  < > = values in this interval not displayed.  CBG (last 3)   Recent Labs  05/14/14 2143 05/15/14 0803 05/15/14 1236  GLUCAP 159* 230* 255*    Scheduled Meds: . antiseptic oral rinse  7 mL Mouth Rinse BID  . atorvastatin  20 mg Oral QHS  . docusate sodium  100 mg Oral BID  . doxazosin  2 mg Oral QAC breakfast  . gabapentin  200 mg Oral QHS  . imipenem-cilastatin  500 mg Intravenous 4 times per day  . insulin aspart  0-20 Units Subcutaneous TID WC  . insulin aspart  0-5 Units Subcutaneous QHS  .  insulin aspart  10 Units Subcutaneous TID WC  . insulin glargine  46 Units Subcutaneous BID  . lisinopril  10 mg Oral Daily  . magnesium sulfate 1 - 4 g bolus IVPB  2 g Intravenous Once  . metoprolol  50 mg Oral BID  . pantoprazole  40 mg Oral BID  . polyethylene glycol  17 g Oral Daily  . sodium chloride  3 mL Intravenous Q12H    Continuous Infusions: . sodium chloride 10 mL/hr at 05/15/14 [place] MS, RD, LDN

## 2014-05-15 NOTE — Progress Notes (Signed)
Patient complaining of a dull headache and requesting PRN tylenol. MD Gherghe verbal order okay to give tylenol at this time.

## 2014-05-15 NOTE — Progress Notes (Signed)
COURTESY NOTE:   Met with [name] at the request of Dr Allean Found to orient him to his situation. Discussed the fact that there are more than a 100 types of cancer and that each is a separate disease, with its own plan of treatment. We do not yet know for sure that he has cancer, as the pathology is not yet out, but we believe he does because there is a mass near his pancreas causing bleeding in his small bowel. We discussed the various treatments available for cancer generally and the fact that some cancers are curable and others not. In particular as the mass is not obviously arising from the pancreas and the CA 19 is not elevated I did not go on to discuss pancreatic cancer specifically.   He understands if the biopsy obtained 11/28 does not provide sufficient information he will need further biopsies. He prefers to stay in the hospital until the question is answered.  If this proves to be a GI malignancy, please direct the consult to Dr Benay Spice or Dr Burr Medico in our office. If it is a lymphoma please direct the consult to Dr Alvy Bimler. If it is a sarcoma or other uncommon tumor I will follow the patient. We should be able to see him as an outpatient within a week of discharge.

## 2014-05-15 NOTE — Progress Notes (Signed)
Pt placed on home CPAP. RT will continue to monitor.

## 2014-05-15 NOTE — Progress Notes (Signed)
Physical Therapy Treatment Patient Details Name: AMARU BURROUGHS MRN: [id number] DOB: 1940-12-16 Today's Date: 05/15/2014    History of Present Illness 73 y.o. male admitted with Palpitations/chest pain/shortness of breath, and orthostatic hypotension. Pt had GI bleed and found to have mass in duodenum and biopsy pending.    PT Comments    Pt currently with functional limitations due to decreased endurance, mobility, strength and balance. Pt will benefit from skilled PT to increase independence and safety with mobility to allow discharge to home with supervision for mobility/OOB. Pt stated he was tired and getting ready to take a nap when PT walked in, but was willing to get up and ambulate in hallway. Pt reported no dizziness or lightheadedness at any point during session. Pt had two instances of loss of balance when ambulating but was able to self correct. Pt ambulated on RA. Pt took a 10 second standing rest break in the middle of ambulating. Pt willing to perform exercises with PT after ambulating.    Follow Up Recommendations  No PT follow up;Supervision for mobility/OOB     Equipment Recommendations  Rolling walker with 5" wheels    Recommendations for Other Services       Precautions / Restrictions Precautions Precautions: Fall Restrictions Weight Bearing Restrictions: No    Mobility  Bed Mobility Overal bed mobility: Modified Independent             General bed mobility comments: Pt able to transition supine to/from sit without physical assistance or cuing from PT. Pt utilized railing to pull self to sitting.    Transfers Overall transfer level: Needs assistance Equipment used: Rolling walker (2 wheeled) Transfers: Sit to/from Stand Sit to Stand: Min guard         General transfer comment: Pt min guard when standing from sitting EOB from lowered bed. Pt did not require cuing for hand placement and did not have any loss of balance transitioning to standing.    Ambulation/Gait Ambulation/Gait assistance: Min assist Ambulation Distance (Feet): 90 Feet Assistive device: Rolling walker (2 wheeled) Gait Pattern/deviations: Step-through pattern;Decreased stride length;Wide base of support   Gait velocity interpretation: Below normal speed for age/gender General Gait Details: Pt with two episodes of loss of balance while ambulating in hallway with RW. Pt able to right self both times when loss of balance occurred. Pt reported no lightheadedness or dizziness with ambulation. Pt stopped half way with ambulation and rested for 10 seconds to catch his breath before completing second half of walk back to room.    Stairs            Wheelchair Mobility    Modified Rankin (Stroke Patients Only)       Balance Overall balance assessment: Needs assistance Sitting-balance support: Feet supported;No upper extremity supported Sitting balance-Leahy Scale: Fair Sitting balance - Comments: Pt able to sit EOB without support but requires single limb support to weight shift.    Standing balance support: No upper extremity supported Standing balance-Leahy Scale: Fair Standing balance comment: Pt able to stand without RW assistance but requires support for mobility.                     Cognition Arousal/Alertness: Awake/alert Behavior During Therapy: WFL for tasks assessed/performed Overall Cognitive Status: Within Functional Limits for tasks assessed                      Exercises General Exercises - Lower Extremity Ankle Circles/Pumps: AROM;Both;10 reps;Seated  Quad Sets: AROM;Both;10 reps;Supine Long Arc Quad: AROM;Both;10 reps;Seated Hip ABduction/ADduction: AROM;Both;10 reps;Supine Hip Flexion/Marching: AROM;Both;10 reps;Seated    General Comments        Pertinent Vitals/Pain Pain Assessment: No/denies pain  BP pre-ambulation: 134/50     HR pre-ambulation: 67     Home Living                      Prior  Function            PT Goals (current goals can now be found in the care plan section) Acute Rehab PT Goals PT Goal Formulation: With patient Time For Goal Achievement: 05/23/14 Potential to Achieve Goals: Good Progress towards PT goals: Progressing toward goals    Frequency  Min 3X/week    PT Plan Current plan remains appropriate    Co-evaluation              End of Session Equipment Utilized During Treatment: Gait belt Activity Tolerance: Patient tolerated treatment well;Patient limited by fatigue Patient left: in bed;with call bell/phone within reach;Other (with dietician)(on CPAP)     Time: 9728-2060 PT Time Calculation (min) (ACUTE ONLY): 25 min  Charges:  $Gait Training: 8-22 mins $Therapeutic Exercise: 8-22 mins                    G Codes:      Kailana Benninger, Tessie Fass SPT 05/15/2014, 4:07 PM  Jearld Shines, SPT  Acute Rehabilitation [phone] 05/15/2014  Donnella Sham, [place] [phone]  (pager)

## 2014-05-15 NOTE — Progress Notes (Signed)
Progress Note  [name] LKG:[id number] DOB: November 15, 1940 DOA: 05/09/2014 PCP: [name] Neer, MD  Admit HPI / Brief Narrative: 73 year old male with known history of obstructive sleep apnea, CAD/remote PCI, diastolic CHF, remote SVT status post ablation, and recent plans for carpal tunnel surgery canceled due to poorly controlled blood sugar who presented to the ED due to palpitations, chest pain, and dizziness.  The patient was found to be orthostatic.  His cardiac enzymes were negative.  During his first night in the hospital (05/10/16) the patient spiked a fever over 103.  He denied cough, had no dysuria, chest x-ray was negative, urinalysis was negative, and leukocytosis on admission of 26K had improved to 17K.  He was started empirically on IV vancomycin and cefepime pending septic workup.  CT chest abdomen and pelvis was significant for a mass in the region of the pancreatic head.  He was seen by GI.  Antibiotics were stopped on 1127/15 as no focus of infection could be identified.  During his second night in the hospital (05/11/14) the patient developed hematochezia (known to have a history of diverticulosis) w/ a drop off his hemoglobin from 10.9 on admission to 7.9.  He was transfused packed red blood cells.  Additionally, the patient spiked a fever again, w/ worsening leukocytosis, so empiric IV antibiotic coverage was resumed.  And EGD was accomplished 11/28, revealing a significant tumor impinging upon the duodenum.  Bxs were obtained, but bleeding limited the extent of the tissue sampling.  Path results are currently pending.    HPI/Subjective: Reports he feels much better than he did at admission.  Had a black stool yesterday.  Is tolerating a solid diet. He and his wife are anxious to determine his course of treatment.  He has 9 siblings, most of whom are in the [place] area.  Patient and wife complain that his arms are very swollen, particularly his right arm.    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  Given fevers query if this could be lymphoma.  Bx results pending.  Further eval to be planned based upon path results.  Dr. 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  Oncology was consulted 12/1 and will plan to see the patient this evening.  Plan is to remain in hospital until diagnosis and tx plan are complete, as time is of the essence.  GI bleed - Acute painless hematochezia 2 episodes of BRBPR -  Aspirin held - Lovenox stopped Significant drop in hemoglobin 10.9 > 7.8 - transfused one unit packed red blood cell Hgb continues to slowly drop. Patient will be transfused his 3rd unit of PRBCs on 12/1. One melanotic stool on 11/30. Tumor itself was ulcerated, and is likely the source of his blood loss   Acute blood loss anemia  Due to GIB  Transfused 3units PRBC thus far (including unit to be given today).  Attempting to maintain a Hgb of 9.0  SIRS - FUO Tmax 103 05/10/14 w/ leukocytosis of 17,000 - fever curve trending down  UA negative - CXR no acute finding - flu PCR negative  Blood cultures remain negative so far Repeat CXR 11/29 w/o infiltrate Empirically on primaxin.    Mixed hyperlipidemia Continue statin for now.  LFTs wnl  Diabetes mellitus Severely uncontrolled - required IV glucose stabilizer at admission -  gtt stopped 11/28 - CBG poorly controlled.  Adjust tx further today and follow - A1c 9.8  Chronic diastolic CHF  Clinically compensated.  LVEF 60 - 65% on 11/26.  CORONARY ATHEROSCLEROSIS NATIVE CORONARY ARTERY Currently denies chest pain - negative troponin 3  Hypomagnesemia Replaced.  Acute renal failure Resolved w/ volume resuscitation   OSA  Obesity - Body mass index is 35.33 kg/(m^2).  Hypoalbuminemia.   Will request nutrition consultation.  Upper extremity swelling. Right > left.  Patient reports multiple IVs.  Will check upper extremity dopplers.   Code Status: FULL Family Communication: spoke w/ wife at bedside. Disposition Plan: to home when appropriate  Consultants: GI Cardiology  Oncology  Procedures: EGD - 11/28 - results noted above   Antibiotics: Cefepime 11/26 Vanc 11/26 > 11/28 Imipenem 11/27 >   DVT prophylaxis: SCDs  Objective: Blood pressure 143/55, pulse 97, temperature 98.5 F (36.9 C), temperature source Oral, resp. rate 22, height 6\' 5"  (1.956 m), weight 135.172 kg (298 lb), SpO2 95 %.  Intake/Output Summary (Last 24 hours) at 05/15/14 0852 Last data filed at 05/15/14 9629  Gross per 24 hour  Intake 736.67 ml  Output      0 ml  Net 736.67 ml   Exam: General: Wd, Tall 6'6" male, NAD, A&O with clear speech.  Wife at bedside. Lungs: Clear to auscultation bilaterally without wheezes or crackles Cardiovascular: Slightly tachy with reg rhythm without murmur gallop or rub Abdomen: Nontender, slightly distended, bowel sounds decreased, no appreciable mass Extremities: 2+ edema bilaterally in upper extremities.  No edema in LE.  Able to ambulate.  Data Reviewed: Basic Metabolic Panel:  Recent Labs Lab 05/10/14 0046 05/11/14 0620 05/12/14 0223 05/13/14 0230 05/14/14 0409 05/15/14 0220  NA  --  139 136* 134* 135* 136*  K  --  4.6 4.3 4.6 4.5 3.9  CL  --  109 104 103 103 103  CO2  --  18* 16* 17* 20 21  GLUCOSE  --  275* 157* 320* 305* 159*  BUN  --  29* 16 17 21 21   CREATININE  --  1.13 0.94 1.14 1.12 1.01  CALCIUM  --  8.2* 8.8 8.6 8.3* 8.5  MG 1.2*  --  1.3*  --  1.6  --   PHOS  --   --   --   --   --  3.5    Liver Function Tests:  Recent Labs Lab 05/09/14 0755 05/09/14 2037 05/13/14 0230 05/14/14 0409 05/15/14 0220  AST 18 12 13 17   --   ALT 15 12 12 11   --   ALKPHOS 63 59 57 63  --   BILITOT 0.7 0.5 0.6 0.4  --   PROT 6.9  5.6* 5.4* 5.0*  --   ALBUMIN 2.7* 2.0* 1.7* 1.6* 1.5*    Recent Labs Lab 05/13/14 0230  LIPASE 18   Coags:  Recent Labs Lab 05/14/14 0409  INR 1.24    Recent Labs Lab 05/14/14 0409  APTT 26    CBC:  Recent Labs Lab 05/09/14 0755  05/09/14 2037  05/12/14 0223 05/12/14 1906 05/13/14 0230 05/14/14 0409 05/15/14 0220  WBC 26.9*  < > 17.7*  < > 19.2* 23.8* 21.4* 20.0* 16.2*  NEUTROABS 21.5*  --  14.6*  --   --   --   --   --   --   HGB 12.1*  < > 10.5*  < > 9.1* 8.3* 8.7* 7.3* 8.4*  HCT 35.6*  < >  31.1*  < > 27.6* 24.6* 26.2* 22.0* 24.9*  MCV 96.2  < > 94.8  < > 93.9 92.5 92.9 92.8 91.2  PLT 337  < > 254  < > 241 261 246 259 232  < > = values in this interval not displayed.  Cardiac Enzymes:  Recent Labs Lab 05/09/14 1300 05/09/14 1850 05/10/14 0046  TROPONINI <0.30 <0.30 <0.30   BNP (last 3 results)  Recent Labs  01/12/14 1244  PROBNP 67.7    CBG:  Recent Labs Lab 05/14/14 0808 05/14/14 1151 05/14/14 1720 05/14/14 2143 05/15/14 0803  GLUCAP 300* 383* 316* 159* 230*    Recent Results (from the past 240 hour(s))  Blood Culture (routine x 2)     Status: None   Collection Time: 05/09/14  9:10 AM  Result Value Ref Range Status   Specimen Description BLOOD LEFT FOREARM  Final   Special Requests BOTTLES DRAWN AEROBIC AND ANAEROBIC 5CC  Final   Culture  Setup Time   Final    05/09/2014 18:03 Performed at [facility]    Culture   Final    NO GROWTH 5 DAYS Performed at [facility]    Report Status 05/15/2014 FINAL  Final  Urine culture     Status: None   Collection Time: 05/09/14  9:55 AM  Result Value Ref Range Status   Specimen Description URINE, CLEAN CATCH  Final   Special Requests NONE  Final   Culture  Setup Time   Final    05/09/2014 18:23 Performed at [place] Performed at [facility]   Final   Culture NO GROWTH Performed at [facility]   Final    Report Status 05/10/2014 FINAL  Final  Blood culture (routine x 2)     Status: None   Collection Time: 05/09/14 11:40 AM  Result Value Ref Range Status   Specimen Description BLOOD LEFT HAND  Final   Special Requests BOTTLES DRAWN AEROBIC ONLY 3CCS  Final   Culture  Setup Time   Final    05/09/2014 18:03 Performed at [facility]    Culture   Final    NO GROWTH 5 DAYS Performed at [facility]    Report Status 05/15/2014 FINAL  Final  MRSA PCR Screening     Status: None   Collection Time: 05/11/14  6:48 AM  Result Value Ref Range Status   MRSA by PCR NEGATIVE NEGATIVE Final    Comment:        The GeneXpert MRSA Assay (FDA approved for NASAL specimens only), is one component of a comprehensive MRSA colonization surveillance program. It is not intended to diagnose MRSA infection nor to guide or monitor treatment for MRSA infections.     Anti-infectives    Start     Dose/Rate Route Frequency Ordered Stop   05/14/14 0000  imipenem-cilastatin (PRIMAXIN) 500 mg in sodium chloride 0.9 % 100 mL IVPB     500 mg200 mL/hr over 30 Minutes Intravenous 4 times per day 05/13/14 2142     05/12/14 1800  imipenem-cilastatin (PRIMAXIN) 500 mg in sodium chloride 0.9 % 100 mL IVPB  Status:  Discontinued     500 mg200 mL/hr over 30 Minutes Intravenous 4 times per day 05/12/14 1307 05/13/14 2138   05/11/14 2000  vancomycin (VANCOCIN) 1,250 mg in sodium chloride 0.9 % 250 mL IVPB  Status:  Discontinued     1,250 mg166.7 mL/hr  over 90 Minutes Intravenous Every 12 hours 05/11/14 1920 05/12/14 1055   05/11/14 2000  imipenem-cilastatin (PRIMAXIN) 500 mg in sodium chloride 0.9 % 100 mL IVPB  Status:  Discontinued     500 mg200 mL/hr over 30 Minutes Intravenous 3 times per day 05/11/14 1920 05/12/14 1307   05/10/14 2100  vancomycin (VANCOCIN) 1,250 mg in sodium chloride 0.9 % 250 mL IVPB  Status:  Discontinued     1,250 mg166.7 mL/hr over 90 Minutes Intravenous Every 12 hours 05/10/14  0753 05/11/14 0816   05/10/14 0830  vancomycin (VANCOCIN) 2,500 mg in sodium chloride 0.9 % 500 mL IVPB     2,500 mg250 mL/hr over 120 Minutes Intravenous NOW 05/10/14 0752 05/10/14 1128   05/10/14 0830  ceFEPIme (MAXIPIME) 1 g in dextrose 5 % 50 mL IVPB  Status:  Discontinued     1 g100 mL/hr over 30 Minutes Intravenous 3 times per day 05/10/14 0752 05/11/14 0816      Scheduled Meds:  Scheduled Meds: . antiseptic oral rinse  7 mL Mouth Rinse BID  . atorvastatin  20 mg Oral QHS  . docusate sodium  100 mg Oral BID  . doxazosin  2 mg Oral QAC breakfast  . gabapentin  200 mg Oral QHS  . imipenem-cilastatin  500 mg Intravenous 4 times per day  . insulin aspart  0-20 Units Subcutaneous TID WC  . insulin aspart  0-5 Units Subcutaneous QHS  . insulin aspart  10 Units Subcutaneous TID WC  . insulin glargine  46 Units Subcutaneous BID  . lisinopril  10 mg Oral Daily  . metoprolol  50 mg Oral BID  . pantoprazole  40 mg Oral BID  . polyethylene glycol  17 g Oral Daily  . sodium chloride  3 mL Intravenous Q12H    Time spent on care of this patient: 35 mins   Karen Kitchens  [facility] Office  [phone] Pager - Text Page per Shea Evans as per below:  On-Call/Text Page:      [email or web address]      password [facility]  05/15/2014, 8:52 AM   LOS: 6 days

## 2014-05-15 NOTE — Progress Notes (Signed)
Spoke with patient about diabetes and home regimen for diabetes control. Patient reports that he is followed by his PCP for diabetes management and currently he takes 70/30 80 units with breakfast, 70/30 90 units with supper, and Metformin 500 mg with supper as an outpatient for diabetes control.  Inquired about knowledge about A1C and patient reports that he does know what an A1C is. In talking with patient and his wife, they report that the patient was to have carpal tunnel surgery in November but when he had his pre-op blood work his glucose was over 400 so his surgery was postponed and he was instructed to see his PCP. At that time, his A1C was 11.6%. Patient admits that he was not checking his blood glucose and was not taking his diabetes medications as prescribed up until his last visit with his PCP. Patient reports that his PCP did increase his 70/30 dosages at his last visit to 70/30 80 units with breakfast and 70/30 90 units with supper.  Patient states that he has been checking his glucose 3-4 times per day and that he has been taking his insulin and Metformin as prescribed and that his glucose has been running well "less than 140 mg/dl".  Discussed A1C results (9.8% on 05/12/14) and stressed importance of checking CBGs and maintaining good CBG control to prevent long-term and short-term complications. Encouraged patient to continue to check his glucose and take his medications as prescribed and follow up with his PCP. Patient verbalized understanding of information discussed and he states that he has no further questions at this time related to diabetes.   Thanks, Barnie Alderman, RN, MSN, CCRN Diabetes Coordinator Inpatient Diabetes Program [phone] (Team Pager) [phone] ([facility] office) [phone] [facility] office)

## 2014-05-15 NOTE — Progress Notes (Signed)
Daily Rounding Note  05/15/2014, 9:12 AM  LOS: 6 days   SUBJECTIVE:       Stool yesterday and this AM still dark.  No nausea, tolerating solids.  Not dizzy with walking independently to bathroom.   OBJECTIVE:         Vital signs in last 24 hours:    Temp:  [97.7 F (36.5 C)-99.8 F (37.7 C)] 98.5 F (36.9 C) (12/01 0656) Pulse Rate:  [77-97] 97 (12/01 0656) Resp:  [16-22] 22 (12/01 0656) BP: (110-143)/(39-60) 143/55 mmHg (12/01 0656) SpO2:  [94 %-99 %] 95 % (12/01 0656) Last BM Date: 05/14/14 Filed Weights   05/09/14 0724 05/09/14 1236 05/10/14 0559  Weight: 290 lb (131.543 kg) 296 lb 4.8 oz (134.4 kg) 298 lb (135.172 kg)   General: obese, pale.  Unwell but not acutely ill looking   Heart: RRR Chest: clear bil.  No cough or labored breathing Abdomen: obese, protuberant  Extremities: no CCE.   Neuro/Psych:  Pleasant, oriented x 3.  Very awake and alert.   Intake/Output from previous day: 11/30 0701 - 12/01 0700 In: 736.7 [P.O.:270; I.V.:87.7; Blood:279; IV Piggyback:100] Out: 200 [Urine:200]  Intake/Output this shift:    Lab Results:  Recent Labs  05/13/14 0230 05/14/14 0409 05/15/14 0220  WBC 21.4* 20.0* 16.2*  HGB 8.7* 7.3* 8.4*  HCT 26.2* 22.0* 24.9*  PLT 246 259 232   BMET  Recent Labs  05/13/14 0230 05/14/14 0409 05/15/14 0220  NA 134* 135* 136*  K 4.6 4.5 3.9  CL 103 103 103  CO2 17* 20 21  GLUCOSE 320* 305* 159*  BUN 17 21 21   CREATININE 1.14 1.12 1.01  CALCIUM 8.6 8.3* 8.5   LFT  Recent Labs  05/13/14 0230 05/14/14 0409 05/15/14 0220  PROT 5.4* 5.0*  --   ALBUMIN 1.7* 1.6* 1.5*  AST 13 17  --   ALT 12 11  --   ALKPHOS 57 63  --   BILITOT 0.6 0.4  --    PT/INR  Recent Labs  05/14/14 0409  LABPROT 15.7*  INR 1.24   Hepatitis Panel No results for input(s): HEPBSAG, HCVAB, HEPAIGM, HEPBIGM in the last 72 hours.  Studies/Results: Dg Chest Port 1  View  05/13/2014   CLINICAL DATA:  Fever, diabetes, hypertension  EXAM: PORTABLE CHEST - 1 VIEW  COMPARISON:  05/09/2014  FINDINGS: Stable cardiomegaly.  Atheromatous aorta.  Lungs are clear. Small left pleural effusion as before. Left arm PICC line to the cavoatrial junction. Regional bones unremarkable.  IMPRESSION: 1. Stable cardiomegaly and small left pleural effusion. 2. Left PICC line to cavoatrial junction.   Electronically Signed   By: Arne Cleveland M.D.   On: 05/13/2014 15:08   Scheduled Meds: . antiseptic oral rinse  7 mL Mouth Rinse BID  . atorvastatin  20 mg Oral QHS  . docusate sodium  100 mg Oral BID  . doxazosin  2 mg Oral QAC breakfast  . gabapentin  200 mg Oral QHS  . imipenem-cilastatin  500 mg Intravenous 4 times per day  . insulin aspart  0-20 Units Subcutaneous TID WC  . insulin aspart  0-5 Units Subcutaneous QHS  . insulin aspart  10 Units Subcutaneous TID WC  . insulin glargine  46 Units Subcutaneous BID  . lisinopril  10 mg Oral Daily  . magnesium sulfate 1 - 4 g bolus IVPB  2 g Intravenous Once  . metoprolol  50 mg  Oral BID  . pantoprazole  40 mg Oral BID  . polyethylene glycol  17 g Oral Daily  . sodium chloride  3 mL Intravenous Q12H   Continuous Infusions: . sodium chloride 10 mL/hr at 05/15/14 0812   PRN Meds:.acetaminophen, acetaminophen, albuterol, alum & mag hydroxide-simeth, dextrose, guaiFENesin-dextromethorphan, ondansetron (ZOFRAN) IV, ondansetron, sodium chloride   ASSESMENT:   * GI bleed. Duodenal/pancretic head mass. 20 # weight loss.  05/12/14 EGD: Compressing duodenal mass with central ulcer, bled upon shallow biopsy. Not clear if this is primary duodenal vs extrinsic pancreatic head mass.  CA 19-9 is 11.6. Awaiting the biopsy pathology, if inconclusive ? EUS/biospy vs CT guided biopsy. On BID IV Protonix.   * Anemia. Normal MCV. Suspect due to ABL. Transfused PRBC x 1 on 11/27.   * IDDM.  Poorly controlled.   *  Leukocytosis, fever, SIRS of undetermined cause. Remains on Primaxin.  Fevers resolved and WBCs, though elevated, improved. Negative blood and urine clxs. Small left pleural effusion on xray.    PLAN   *  Dr Donato Heinz, pathology, requesting additional cuts at 1350 today, will not have a reading/report until late AM to early afternoon tomorrow.  Path # is [id number]. If path negative for cancer, will need CT guided biopsy.      [name]  05/15/2014, 9:12 AM Pager: [phone]     Attending physician's note   I have taken an interval history, reviewed the chart and examined the patient. I agree with the Advanced Practitioner's note, impression and recommendations. Bleeding has slowed or stopped. Awaiting pathology report as above. Recommend CT guided biopsy of the mass if pathology is not diagnostic of malignancy.   Pricilla Riffle. Fuller Plan, MD [facility]

## 2014-05-16 DIAGNOSIS — R609 Edema, unspecified: Secondary | ICD-10-CM

## 2014-05-16 DIAGNOSIS — R1900 Intra-abdominal and pelvic swelling, mass and lump, unspecified site: Secondary | ICD-10-CM | POA: Insufficient documentation

## 2014-05-16 LAB — GLUCOSE, CAPILLARY
GLUCOSE-CAPILLARY: 336 mg/dL — AB (ref 70–99)
GLUCOSE-CAPILLARY: 251 mg/dL — AB (ref 70–99)
GLUCOSE-CAPILLARY: 267 mg/dL — AB (ref 70–99)
Glucose-Capillary: 262 mg/dL — ABNORMAL HIGH (ref 70–99)
Glucose-Capillary: 233 mg/dL — ABNORMAL HIGH (ref 70–99)
Glucose-Capillary: 261 mg/dL — ABNORMAL HIGH (ref 70–99)

## 2014-05-16 LAB — URINALYSIS, ROUTINE W REFLEX MICROSCOPIC
Bilirubin Urine: NEGATIVE
GLUCOSE, UA: NEGATIVE mg/dL
Hgb urine dipstick: NEGATIVE
KETONES UR: NEGATIVE mg/dL
Leukocytes, UA: NEGATIVE
Nitrite: NEGATIVE
Protein, ur: NEGATIVE mg/dL
Specific Gravity, Urine: 1.02 (ref 1.005–1.030)
UROBILINOGEN UA: 0.2 mg/dL (ref 0.0–1.0)
pH: 5 (ref 5.0–8.0)

## 2014-05-16 LAB — TYPE AND SCREEN
ABO/RH(D): O POS
Antibody Screen: NEGATIVE
Unit division: 0

## 2014-05-16 LAB — PROCALCITONIN: Procalcitonin: 0.22 ng/mL

## 2014-05-16 MED ORDER — INSULIN ASPART 100 UNIT/ML ~~LOC~~ SOLN
15.0000 [IU] | Freq: Three times a day (TID) | SUBCUTANEOUS | Status: DC
  Administered 2014-05-16 – 2014-05-18 (×5): 15 [IU] via SUBCUTANEOUS

## 2014-05-16 MED ORDER — INSULIN GLARGINE 100 UNIT/ML ~~LOC~~ SOLN
50.0000 [IU] | Freq: Two times a day (BID) | SUBCUTANEOUS | Status: DC
  Administered 2014-05-16 – 2014-05-18 (×4): 50 [IU] via SUBCUTANEOUS
  Filled 2014-05-16 (×5): qty 0.5

## 2014-05-16 MED ORDER — GLUCERNA SHAKE PO LIQD
237.0000 mL | Freq: Three times a day (TID) | ORAL | Status: DC
  Administered 2014-05-16 – 2014-05-18 (×7): 237 mL via ORAL

## 2014-05-16 MED ORDER — SODIUM CHLORIDE 0.9 % IV SOLN
500.0000 mg | Freq: Four times a day (QID) | INTRAVENOUS | Status: DC
  Administered 2014-05-16 – 2014-05-18 (×8): 500 mg via INTRAVENOUS
  Filled 2014-05-16 (×10): qty 500

## 2014-05-16 NOTE — Consult Note (Signed)
Reason for consult: peripancreatic lymph node biopsy  Referring Physician(s): Dr. Cruzita Lederer  History of Present Illness: [name] is a 73 y.o. male with history of diverticulosis, CKD, DM, CHF,OSA recently admitted with painless hematochezia, dizziness, CP, orthostatic hypotension, palpitations, fever, leukocytosis. Subsequent imaging revealed a mass adjacent to pancreatic head and along the C-loop of the duodenum measuring 6 cm. No pancreatic duct dilatation or biliary duct dilatation. Recent EGD revealed duodenal mass with sig luminal compression - bx's taken at time were inconclusive.  Request now received for CT guided biopsy. Of note, pt had sister who died from pancreatic cancer.  Past Medical History  Diagnosis Date  . Diabetes mellitus   . Hyperlipidemia   . Hypertension   . GERD (gastroesophageal reflux disease)   . Coronary artery disease     a. S/P prior PTCA in the 90's;  b. 04/2007 Cath: LM nl, LAD Ca2+ prox, LCX nl, RI nl, OM nl, RCA 40-50p, 40d, EF 60%;  b. 09/2012 Echo: EF 55-60%, mild LVH, nl wall motion, Gr 1 DD, mildly dil LA.  . Right ureteral stone   . SVT (supraventricular tachycardia)     a. s/p ablation per Dr. Lovena Le 10 -68yrs ago  . OSA on CPAP     a. cpap setting of 16  . Arthritis   . Bilateral kidney stones   . Shortness of breath dyspnea   . CKD (chronic kidney disease), stage III     Past Surgical History  Procedure Laterality Date  . Right ureteroscopic stone extraction  09-26-2010  . Circumcision and fulgeration of condyloma  06-22-2003  . Cardiac electrophysiology study and ablation  2002  . Transthoracic echocardiogram  03-03-2011    MODERATE LVH/ LVSF NORMAL / EF 60-65%/ MILDLY DILATED LEFT ATRIUMK  . Severeal ureteroscopic stone extractions    . Ureteroscopy  11/13/2011    Procedure: URETEROSCOPY;  Surgeon: Claybon Jabs, MD;  Location: [facility];  Service: Urology;  Laterality: Right;  RIGHT URETEROSCOPY WITH HOLMIUM  LASER LIHTOTRIPSY DIGITAL URETEROSCOPE  . Coronary angioplasty  1994    [place] OF THE LAD  . Cardiac catheterization  04-27-2007    CAD WITH 30% NARROWING IN THE MID-LAD/ 50 % NARROWING PROXIMAL CIRCUMFLEX/ 40% NARROWING SECOND MARGINAL BRANCH/ 40-50% PROXIMAL RCA WITH DISTAL POSTERIOR NARROWING/ NORMAL LVF  . Cardiac catheterization  2000    NON-OBSTRUCTIVE CAD  . Laparoscopic cholecystectomy  03-26-2005  . Total knee arthroplasty  07/18/2012    Procedure: TOTAL KNEE ARTHROPLASTY;  Surgeon: Johnn Hai, MD;  Location: [facility];  Service: Orthopedics;  Laterality: Right;  . Esophagogastroduodenoscopy N/A 05/12/2014    Procedure: ESOPHAGOGASTRODUODENOSCOPY (EGD);  Surgeon: Beryle Beams, MD;  Location: [facility] ENDOSCOPY;  Service: Endoscopy;  Laterality: N/A;    Allergies: Clindamycin/lincomycin; Codeine; and Penicillins  Medications: Prior to Admission medications   Medication Sig Start Date End Date Taking? Authorizing Provider  aspirin EC 81 MG tablet Take 81 mg by mouth daily.   Yes Historical Provider, MD  atorvastatin (LIPITOR) 20 MG tablet Take 20 mg by mouth at bedtime.   Yes Historical Provider, MD  doxazosin (CARDURA) 2 MG tablet Take 2 mg by mouth daily before breakfast.    Yes Historical Provider, MD  furosemide (LASIX) 20 MG tablet Take 60 mg by mouth daily. 01/09/14  Yes Burnell Blanks, MD  insulin NPH-regular Human (NOVOLIN 70/30) (70-30) 100 UNIT/ML injection Inject 45-65 Units into the skin 2 (two) times daily with a meal.  80 units in am with breakfast and 90 units in pm with supper   Yes Historical Provider, MD  lisinopril (PRINIVIL,ZESTRIL) 20 MG tablet Take 20 mg by mouth daily.   Yes Historical Provider, MD  metFORMIN (GLUCOPHAGE-XR) 500 MG 24 hr tablet Take 500 mg by mouth daily with supper.  05/01/14  Yes Historical Provider, MD  metoprolol (LOPRESSOR) 100 MG tablet Take 50 mg by mouth 2 (two) times daily.    Yes Historical Provider, MD  Multiple Vitamins-Minerals  (PRESERVISION AREDS PO) Take 1 tablet by mouth 2 (two) times daily.   Yes Historical Provider, MD  omeprazole (PRILOSEC) 20 MG capsule Take 40 mg by mouth daily.    Yes Historical Provider, MD  potassium chloride SA (K-DUR,KLOR-CON) 20 MEQ tablet Take 1 tablet (20 mEq total) by mouth 2 (two) times daily. 01/09/14  Yes Burnell Blanks, MD    Family History  Problem Relation Age of Onset  . Heart attack Mother   . Heart attack Brother   . Colon cancer Neg Hx   . Stomach cancer Neg Hx     History   Social History  . Marital Status: Married    Spouse Name: N/A    Number of Children: N/A  . Years of Education: N/A   Social History Main Topics  . Smoking status: Former Smoker -- 1.00 packs/day for 15 years    Types: Cigarettes    Quit date: 06/15/1986  . Smokeless tobacco: Never Used  . Alcohol Use: Yes     Comment: occasional  . Drug Use: No  . Sexual Activity: None   Other Topics Concern  . None   Social History Narrative        Review of Systems  Constitutional: Positive for fever and unexpected weight change.  Respiratory: Positive for shortness of breath. Negative for cough.   Cardiovascular: Positive for leg swelling.  Gastrointestinal: Positive for blood in stool. Negative for nausea, vomiting and abdominal pain.  Genitourinary: Positive for hematuria. Negative for dysuria.  Musculoskeletal:       Occ back pain  Neurological:       Occ HA's  Psychiatric/Behavioral: The patient is nervous/anxious.      Vital Signs: BP 114/50 mmHg  Pulse 87  Temp(Src) 99.8 F (37.7 C) (Oral)  Resp 22  Ht 6\' 5"  (1.956 m)  Wt 298 lb (135.172 kg)  BMI 35.33 kg/m2  SpO2 93%  Physical Exam  Constitutional: He is oriented to person, place, and time. He appears well-developed and well-nourished.  Cardiovascular: Normal rate and regular rhythm.   Pulmonary/Chest: Effort normal.  Sl dim BS bases  Abdominal: Soft. Bowel sounds are normal. There is no tenderness.    Obese, protuberant  Musculoskeletal: He exhibits edema.  Neurological: He is alert and oriented to person, place, and time.    Imaging: Dg Chest 2 View  05/01/2014   CLINICAL DATA:  Leukocytosis for 5 days, lightheaded, diabetes mellitus, hypertension, GERD, coronary artery disease, former smoker  EXAM: CHEST  2 VIEW  COMPARISON:  01/12/2014 ; correlation CT chest 10/03/2012  FINDINGS: Enlargement of cardiac silhouette.  Prominent density at the RIGHT cardiophrenic angle likely prominent epicardial fat pad, unchanged since previous exam, with prominent fat pad noted on prior CT.  Mediastinal contours and pulmonary vascularity normal.  Bronchitic changes without infiltrate, pleural effusion or pneumothorax.  No acute osseous findings.  IMPRESSION: Enlargement of cardiac silhouette.  Mild chronic bronchitic changes.  No acute abnormalities.   Electronically Signed  By: Lavonia Dana M.D.   On: 05/01/2014 16:55   Ct Angio Chest Pe W/cm &/or Wo Cm  05/10/2014   CLINICAL DATA:  Leukocytosis, fever of unknown origin, short of breath, chest pain and dizziness.  EXAM: CT ANGIOGRAPHY CHEST  CT ABDOMEN AND PELVIS WITH CONTRAST  TECHNIQUE: Multidetector CT imaging of the chest was performed using the standard protocol during bolus administration of intravenous contrast. Multiplanar CT image reconstructions and MIPs were obtained to evaluate the vascular anatomy. Multidetector CT imaging of the abdomen and pelvis was performed using the standard protocol during bolus administration of intravenous contrast.  CONTRAST:  82mL OMNIPAQUE IOHEXOL 350 MG/ML SOLN  COMPARISON:  80 mL Omnipaque 350  FINDINGS: CTA CHEST FINDINGS  No filling defects within the pulmonary arteries to suggest acute pulmonary embolism. No acute findings aorta great vessels. No pericardial fluid. Esophagus is normal.  Review of the lung parenchyma demonstrates no suspicious pulmonary nodules. No airspace disease. No pleural fluid.  CT ABDOMEN and  PELVIS FINDINGS  Abdominal portion was scanned at a delayed time . Therefore it is essentially a noncontrast exam.  Hepatobiliary: No focal hepatic lesion identified on this noncontrast exam. Post cholecystectomy.  Pancreas: There is an mass at the level of the pancreatic head measuring 5.2 x 3.1 x 6.1 cm. This mass is along the medial border of the second portion the duodenum and compressive the lumen of the duodenum duodenum. There is enlarged lymph node adjacent to the mass measuring 19 mm. No pancreatic duct dilatation. There is no biliary duct dilatation.  Spleen: Normal spleen appear  Adrenals/Urinary Tract: Adrenal glands are normal. Delayed imaging of the kidneys demonstrate no obstruction. Ureters and bladder normal.  Stomach/Bowel: Stomach, small bowel and appendix are normal. The colon rectosigmoid colon are normal. There are diverticula of the sigmoid colon acute inflammation.  Mass in the C-loop of the duodenum described in the pancreatic section.  Vascular/Lymphatic: There is scattered calcification of the abdominal aorta. No retroperitoneal periportal lymphadenopathy. No mesenteric or peritoneal metastasis.  Reproductive: Prostate gland and bladder normal. No pelvic lymphadenopathy.  Other: No free fluid in the abdomen pelvis.  No free air  Musculoskeletal: No aggressive osseous lesion.  Review of the MIP images confirms the above findings.  IMPRESSION: Chest Impression:  No evidence of acute pulmonary embolism.  Abdomen / Pelvis Impression:  1. Mass adjacent to pancreatic head and along the C-loop of the duodenum measuring 6 cm. No pancreatic duct dilatation or biliary duct dilatation. Differential includes pancreatic neoplasm, gastrointestinal stromal tumor, or lymphoma. Recommend endoscopic ultrasound with biopsy of this lesion.  2. Local lymphadenopathy with enlarged peripancreatic lymph node.  3. Liver parenchyma is poorly evaluated on this essentially noncontrast exam.  Findings conveyed  toDAWOOD ELGERGAWY on 05/10/2014  at19:17.   Electronically Signed   By: Suzy Bouchard M.D.   On: 05/10/2014 19:18   Ct Abdomen Pelvis W Contrast  05/10/2014   CLINICAL DATA:  Leukocytosis, fever of unknown origin, short of breath, chest pain and dizziness.  EXAM: CT ANGIOGRAPHY CHEST  CT ABDOMEN AND PELVIS WITH CONTRAST  TECHNIQUE: Multidetector CT imaging of the chest was performed using the standard protocol during bolus administration of intravenous contrast. Multiplanar CT image reconstructions and MIPs were obtained to evaluate the vascular anatomy. Multidetector CT imaging of the abdomen and pelvis was performed using the standard protocol during bolus administration of intravenous contrast.  CONTRAST:  53mL OMNIPAQUE IOHEXOL 350 MG/ML SOLN  COMPARISON:  80 mL Omnipaque 350  FINDINGS:  CTA CHEST FINDINGS  No filling defects within the pulmonary arteries to suggest acute pulmonary embolism. No acute findings aorta great vessels. No pericardial fluid. Esophagus is normal.  Review of the lung parenchyma demonstrates no suspicious pulmonary nodules. No airspace disease. No pleural fluid.  CT ABDOMEN and PELVIS FINDINGS  Abdominal portion was scanned at a delayed time . Therefore it is essentially a noncontrast exam.  Hepatobiliary: No focal hepatic lesion identified on this noncontrast exam. Post cholecystectomy.  Pancreas: There is an mass at the level of the pancreatic head measuring 5.2 x 3.1 x 6.1 cm. This mass is along the medial border of the second portion the duodenum and compressive the lumen of the duodenum duodenum. There is enlarged lymph node adjacent to the mass measuring 19 mm. No pancreatic duct dilatation. There is no biliary duct dilatation.  Spleen: Normal spleen appear  Adrenals/Urinary Tract: Adrenal glands are normal. Delayed imaging of the kidneys demonstrate no obstruction. Ureters and bladder normal.  Stomach/Bowel: Stomach, small bowel and appendix are normal. The colon  rectosigmoid colon are normal. There are diverticula of the sigmoid colon acute inflammation.  Mass in the C-loop of the duodenum described in the pancreatic section.  Vascular/Lymphatic: There is scattered calcification of the abdominal aorta. No retroperitoneal periportal lymphadenopathy. No mesenteric or peritoneal metastasis.  Reproductive: Prostate gland and bladder normal. No pelvic lymphadenopathy.  Other: No free fluid in the abdomen pelvis.  No free air  Musculoskeletal: No aggressive osseous lesion.  Review of the MIP images confirms the above findings.  IMPRESSION: Chest Impression:  No evidence of acute pulmonary embolism.  Abdomen / Pelvis Impression:  1. Mass adjacent to pancreatic head and along the C-loop of the duodenum measuring 6 cm. No pancreatic duct dilatation or biliary duct dilatation. Differential includes pancreatic neoplasm, gastrointestinal stromal tumor, or lymphoma. Recommend endoscopic ultrasound with biopsy of this lesion.  2. Local lymphadenopathy with enlarged peripancreatic lymph node.  3. Liver parenchyma is poorly evaluated on this essentially noncontrast exam.  Findings conveyed toDAWOOD ELGERGAWY on 05/10/2014  at19:17.   Electronically Signed   By: Suzy Bouchard M.D.   On: 05/10/2014 19:18   Dg Chest Port 1 View  05/13/2014   CLINICAL DATA:  Fever, diabetes, hypertension  EXAM: PORTABLE CHEST - 1 VIEW  COMPARISON:  05/09/2014  FINDINGS: Stable cardiomegaly.  Atheromatous aorta.  Lungs are clear. Small left pleural effusion as before. Left arm PICC line to the cavoatrial junction. Regional bones unremarkable.  IMPRESSION: 1. Stable cardiomegaly and small left pleural effusion. 2. Left PICC line to cavoatrial junction.   Electronically Signed   By: Arne Cleveland M.D.   On: 05/13/2014 15:08   Dg Chest Port 1 View  05/09/2014   CLINICAL DATA:  Chest pain  EXAM: PORTABLE CHEST - 1 VIEW  COMPARISON:  05/01/2014  FINDINGS: Cardiac shadow remains enlarged. The lungs are  well aerated bilaterally without focal infiltrate or sizable effusion. Mild elevation of the right hemidiaphragm is again seen. No gross soft tissue abnormality is noted.  IMPRESSION: No acute abnormality seen.   Electronically Signed   By: Inez Catalina M.D.   On: 05/09/2014 08:23    Labs:  CBC:  Recent Labs  05/13/14 0230 05/14/14 0409 05/15/14 0220 05/15/14 1640  WBC 21.4* 20.0* 16.2* 15.6*  HGB 8.7* 7.3* 8.4* 9.2*  HCT 26.2* 22.0* 24.9* 26.9*  PLT 246 259 232 237    COAGS:  Recent Labs  05/14/14 0409  INR 1.24  APTT 26  BMP:  Recent Labs  05/12/14 0223 05/13/14 0230 05/14/14 0409 05/15/14 0220  NA 136* 134* 135* 136*  K 4.3 4.6 4.5 3.9  CL 104 103 103 103  CO2 16* 17* 20 21  GLUCOSE 157* 320* 305* 159*  BUN 16 17 21 21   CALCIUM 8.8 8.6 8.3* 8.5  CREATININE 0.94 1.14 1.12 1.01  GFRNONAA 82* 62* 64* 72*  GFRAA >90 72* 74* 84*    LIVER FUNCTION TESTS:  Recent Labs  05/09/14 0755 05/09/14 2037 05/13/14 0230 05/14/14 0409 05/15/14 0220  BILITOT 0.7 0.5 0.6 0.4  --   AST 18 12 13 17   --   ALT 15 12 12 11   --   ALKPHOS 63 59 57 63  --   PROT 6.9 5.6* 5.4* 5.0*  --   ALBUMIN 2.7* 2.0* 1.7* 1.6* 1.5*    TUMOR MARKERS:  Recent Labs  05/11/14 1757  CA199 11.6*    Assessment and Plan: [name] is a 73 y.o. male with history of diverticulosis, CKD, DM, CHF,OSA recently admitted with painless hematochezia, dizziness, CP, orthostatic hypotension, palpitations, fever, leukocytosis. Subsequent imaging revealed a mass adjacent to pancreatic head and along the C-loop of the duodenum measuring 6 cm. No pancreatic duct dilatation or biliary duct dilatation. Recent EGD revealed duodenal mass with sig luminal compression - bx's taken at time were inconclusive.  Request now received for CT guided biopsy. Of note, pt had sister who died from pancreatic cancer. Imaging studies have been reviewed by Dr. Laurence Ferrari and most accessible/safest  area to biopsy  would be the peripancreatic lymph node. Details/risks of procedure d/w pt/family with their understanding and consent. Will tent plan case for 12/3.        I spent a total of 20 minutes face to face in clinical consultation, greater than 50% of which was counseling/coordinating care for peripancreatic lymph node biopsy  Signed: Autumn Messing 05/16/2014, 1:34 PM

## 2014-05-16 NOTE — Progress Notes (Signed)
Daily Rounding Note  05/16/2014, 9:25 AM  LOS: 7 days   SUBJECTIVE:       Feels well. No bloody stools. No nausea. Tolerating solids  OBJECTIVE:         Vital signs in last 24 hours:    Temp:  [98.4 F (36.9 C)-99.8 F (37.7 C)] 99.8 F (37.7 C) (12/02 0524) Pulse Rate:  [87-104] 87 (12/02 0524) Resp:  [15-22] 22 (12/02 0524) BP: (113-130)/(50-58) 114/50 mmHg (12/02 0524) SpO2:  [93 %-97 %] 93 % (12/02 0524) Last BM Date: 05/15/14 Filed Weights   05/09/14 0724 05/09/14 1236 05/10/14 0559  Weight: 290 lb (131.543 kg) 296 lb 4.8 oz (134.4 kg) 298 lb (135.172 kg)   General: looks chronically unwell with poor, ashen coloring   Heart: RRR Chest: clear bil.  No adventitious sounds.  No dyspnea Abdomen: large, NT, ND.  BS active.   Extremities: no CCE Neuro/Psych:  Pleasant, relaxed, oriented x 3.  Alert and engaged.   Intake/Output from previous day: 12/01 0701 - 12/02 0700 In: 2026 [P.O.:1320; I.V.:243.5; Blood:462.5] Out: -   Intake/Output this shift:    Lab Results:  Recent Labs  05/14/14 0409 05/15/14 0220 05/15/14 1640  WBC 20.0* 16.2* 15.6*  HGB 7.3* 8.4* 9.2*  HCT 22.0* 24.9* 26.9*  PLT 259 232 237   BMET  Recent Labs  05/14/14 0409 05/15/14 0220  NA 135* 136*  K 4.5 3.9  CL 103 103  CO2 20 21  GLUCOSE 305* 159*  BUN 21 21  CREATININE 1.12 1.01  CALCIUM 8.3* 8.5   LFT  Recent Labs  05/14/14 0409 05/15/14 0220  PROT 5.0*  --   ALBUMIN 1.6* 1.5*  AST 17  --   ALT 11  --   ALKPHOS 63  --   BILITOT 0.4  --    PT/INR  Recent Labs  05/14/14 0409  LABPROT 15.7*  INR 1.24    Scheduled Meds: . antiseptic oral rinse  7 mL Mouth Rinse BID  . atorvastatin  20 mg Oral QHS  . docusate sodium  100 mg Oral BID  . doxazosin  2 mg Oral QAC breakfast  . feeding supplement (GLUCERNA SHAKE)  237 mL Oral BID BM  . feeding supplement (GLUCERNA SHAKE)  237 mL Oral TID BM  . gabapentin   200 mg Oral QHS  . imipenem-cilastatin  500 mg Intravenous 4 times per day  . insulin aspart  0-20 Units Subcutaneous TID WC  . insulin aspart  0-5 Units Subcutaneous QHS  . insulin aspart  10 Units Subcutaneous TID WC  . insulin glargine  46 Units Subcutaneous BID  . lisinopril  10 mg Oral Daily  . metoprolol  50 mg Oral BID  . pantoprazole  40 mg Oral BID  . polyethylene glycol  17 g Oral Daily  . sodium chloride  3 mL Intravenous Q12H   Continuous Infusions: . sodium chloride 10 mL/hr at 05/15/14 0812   PRN Meds:.acetaminophen, acetaminophen, albuterol, alum & mag hydroxide-simeth, dextrose, guaiFENesin-dextromethorphan, lidocaine, ondansetron (ZOFRAN) IV, ondansetron, sodium chloride  ASSESMENT:   * GI bleed. Duodenal/pancretic head mass. 20 # weight loss.  05/12/14 EGD: Compressing duodenal mass with central ulcer, bled upon shallow biopsy. Not clear if this is primary duodenal vs extrinsic pancreatic head mass.  CA 19-9 is 11.6.On BID IV Protonix.  Pathology shows eroded duodenal mucosa.  No malignancy.  * Anemia. Normal MCV. Suspect due to ABL. Transfused PRBC  x 1 on 11/27.   * IDDM. Poorly controlled.   * Leukocytosis, fever, SIRS of undetermined cause. Remains on Primaxin. Fevers resolved and WBCs, though elevated, improved. Negative blood and urine clxs. Small left pleural effusion on xray.     PLAN   *  CT guided biopsy arranged for tomorrow.  Hopefully pathology results will be back by end of week.      Azucena Freed  05/16/2014, 9:25 AM Pager: [phone]     Attending physician's note   I have taken an interval history, reviewed the chart and examined the patient. I agree with the Advanced Practitioner's note, impression and recommendations. No evidence of recurrent GI bleeding. Endoscopic biopsies were negative for malignancy. CT guided biopsy planned for tomorrow.  Pricilla Riffle. Fuller Plan, MD [facility]

## 2014-05-16 NOTE — Progress Notes (Signed)
Progress Note  GENERAL WEARING NFA:[id number] DOB: [birth year] DOA: 05/09/2014 PCP: [name], MD  Admit HPI / Brief Narrative: 73 year old male with known history of obstructive sleep apnea, CAD/remote PCI, diastolic CHF, remote SVT status post ablation, and recent plans for carpal tunnel surgery canceled due to poorly controlled blood sugar who presented to the ED due to palpitations, chest pain, and dizziness.  The patient was found to be orthostatic.  His cardiac enzymes were negative.  During his first night in the hospital (05/10/16) the patient spiked a fever over 103.  He denied cough, had no dysuria, chest x-ray was negative, urinalysis was negative, and leukocytosis on admission of 26K had improved to 17K.  He was started empirically on IV vancomycin and cefepime pending septic workup.  CT chest abdomen and pelvis was significant for a mass in the region of the pancreatic head.  He was seen by GI.  Antibiotics were stopped on 1127/15 as no focus of infection could be identified.  During his second night in the hospital (05/11/14) the patient developed hematochezia (known to have a history of diverticulosis) w/ a drop off his hemoglobin from 10.9 on admission to 7.9.  He was transfused packed red blood cells.  Additionally, the patient spiked a fever again, w/ worsening leukocytosis, so empiric IV antibiotic coverage was resumed.  And EGD was accomplished 11/28, revealing a significant tumor impinging upon the duodenum.  Bxs were obtained, but bleeding limited the extent of the tissue sampling.  Path results are inconclusive.    HPI/Subjective: Complains of back pain from the bed.  Reports a black stool on 12/1.  Dark colored urine on 12/2 - wife concerned that it is blood.   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   Bx  results were inconclusive.  IR CT Guided Bx pending.  Dr. Benson Norway discussed w/ family - of note CA 19-9 was actually low.  Oncology was consulted 12/1.  Plan is to remain in hospital until diagnosis and tx plan are complete.    GI bleed - Acute painless hematochezia 2 episodes of BRBPR -  Aspirin held - Lovenox stopped.  Patient continues to have black stools. Significant drop in hemoglobin 10.9 > 7.8.  He was transfused but continued to have melana (1 black stool on 11/30, and a 2nd on 12/1). Has been transfused a total of 3 units.  Tumor itself was ulcerated, and is likely the source of his blood loss. I am concerned that if the mass continues to ooze it will delay discharge.  SIRS - FUO Tmax 103 05/10/14 w/ leukocytosis of 17,000 - fever curve was trending down, but spiked a fever of 102.7 on 12/2 after being on primaxin. UA and cultures are negative - CXR no acute finding - flu PCR negative  Blood cultures are negative and final. Repeat CXR 11/29 w/o infiltrate ? lymphoma  Acute blood loss anemia  Due to GIB  Transfused 3units PRBC thus far. Attempting to maintain a Hgb of 9.0 12/2 possible hematuria.  Will check u/a.  No hgb in U/A on admission.  Diabetes mellitus Severely uncontrolled - required IV glucose stabilizer at admission - gtt stopped 11/28 - CBG poorly controlled.  Adjust tx further today and follow - A1c 9.8 On large doses of Lantus and novolog yet still has poor control.  Will increase Lantus to  50 bid on 12/2.  Mixed hyperlipidemia Continue statin for now.  LFTs wnl  Chronic diastolic CHF  Clinically compensated.  LVEF 60 - 65% on 11/26.  CORONARY ATHEROSCLEROSIS NATIVE CORONARY ARTERY Currently denies chest pain - negative troponin 3  Hypomagnesemia Replaced.  Acute renal failure Resolved w/ volume resuscitation   OSA  Obesity - Body mass index is 35.33 kg/(m^2).  Hypoalbuminemia. Appreciate nutrition consult.  Added glucerna.  Upper extremity  swelling. Right > left.  Patient reports multiple IVs.  Will check upper extremity dopplers.   Code Status: FULL Family Communication: spoke w/ wife at bedside. Disposition Plan: to home when appropriate  Consultants: GI Cardiology  Oncology Interventional Radiology  Procedures: EGD - 11/28 - results noted above   Antibiotics: Cefepime 11/26 Vanc 11/26 > 11/28 Imipenem 11/27 >12/2   DVT prophylaxis: SCDs as he is bleeding.  Objective: Blood pressure 114/50, pulse 87, temperature 99.8 F (37.7 C), temperature source Oral, resp. rate 22, height 6\' 5"  (1.956 m), weight 135.172 kg (298 lb), SpO2 93 %.  Intake/Output Summary (Last 24 hours) at 05/16/14 1148 Last data filed at 05/16/14 0954  Gross per 24 hour  Intake   1756 ml  Output      0 ml  Net   1756 ml   Exam: General: Wd, Tall 6'6" male, NAD, A&O with clear speech.  Wife at bedside. Lungs: Clear to auscultation bilaterally without wheezes or crackles Cardiovascular: regular rate with reg rhythm without murmur gallop or rub Abdomen: Nontender, slightly distended, bowel sounds decreased, no appreciable mass Extremities: 2+ edema bilaterally in upper extremities.  No edema in LE.  Able to ambulate.  Data Reviewed: Basic Metabolic Panel:  Recent Labs Lab 05/10/14 0046 05/11/14 0620 05/12/14 0223 05/13/14 0230 05/14/14 0409 05/15/14 0220  NA  --  139 136* 134* 135* 136*  K  --  4.6 4.3 4.6 4.5 3.9  CL  --  109 104 103 103 103  CO2  --  18* 16* 17* 20 21  GLUCOSE  --  275* 157* 320* 305* 159*  BUN  --  29* 16 17 21 21   CREATININE  --  1.13 0.94 1.14 1.12 1.01  CALCIUM  --  8.2* 8.8 8.6 8.3* 8.5  MG 1.2*  --  1.3*  --  1.6  --   PHOS  --   --   --   --   --  3.5    Liver Function Tests:  Recent Labs Lab 05/09/14 2037 05/13/14 0230 05/14/14 0409 05/15/14 0220  AST 12 13 17   --   ALT 12 12 11   --   ALKPHOS 59 57 63  --   BILITOT 0.5 0.6 0.4  --   PROT 5.6* 5.4* 5.0*  --   ALBUMIN 2.0* 1.7* 1.6*  1.5*    Recent Labs Lab 05/13/14 0230  LIPASE 18   Coags:  Recent Labs Lab 05/14/14 0409  INR 1.24    Recent Labs Lab 05/14/14 0409  APTT 26    CBC:  Recent Labs Lab 05/09/14 2037  05/12/14 1906 05/13/14 0230 05/14/14 0409 05/15/14 0220 05/15/14 1640  WBC 17.7*  < > 23.8* 21.4* 20.0* 16.2* 15.6*  NEUTROABS 14.6*  --   --   --   --   --   --   HGB 10.5*  < > 8.3* 8.7* 7.3* 8.4* 9.2*  HCT 31.1*  < > 24.6* 26.2* 22.0* 24.9* 26.9*  MCV 94.8  < > 92.5 92.9 92.8  91.2 92.4  PLT 254  < > 261 246 259 232 237  < > = values in this interval not displayed.  Cardiac Enzymes:  Recent Labs Lab 05/09/14 1300 05/09/14 1850 05/10/14 0046  TROPONINI <0.30 <0.30 <0.30   BNP (last 3 results)  Recent Labs  01/12/14 1244  PROBNP 67.7    CBG:  Recent Labs Lab 05/15/14 1236 05/15/14 1721 05/15/14 2148 05/16/14 0751 05/16/14 1022  GLUCAP 255* 406* 267* 251* 262*    Recent Results (from the past 240 hour(s))  Blood Culture (routine x 2)     Status: None   Collection Time: 05/09/14  9:10 AM  Result Value Ref Range Status   Specimen Description BLOOD LEFT FOREARM  Final   Special Requests BOTTLES DRAWN AEROBIC AND ANAEROBIC 5CC  Final   Culture  Setup Time   Final    05/09/2014 18:03 Performed at [facility]    Culture   Final    NO GROWTH 5 DAYS Performed at [facility]    Report Status 05/15/2014 FINAL  Final  Urine culture     Status: None   Collection Time: 05/09/14  9:55 AM  Result Value Ref Range Status   Specimen Description URINE, CLEAN CATCH  Final   Special Requests NONE  Final   Culture  Setup Time   Final    05/09/2014 18:23 Performed at [place] Performed at [facility]   Final   Culture NO GROWTH Performed at [facility]   Final   Report Status 05/10/2014 FINAL  Final  Blood culture (routine x 2)     Status: None   Collection Time: 05/09/14 11:40 AM  Result  Value Ref Range Status   Specimen Description BLOOD LEFT HAND  Final   Special Requests BOTTLES DRAWN AEROBIC ONLY 3CCS  Final   Culture  Setup Time   Final    05/09/2014 18:03 Performed at [facility]    Culture   Final    NO GROWTH 5 DAYS Performed at [facility]    Report Status 05/15/2014 FINAL  Final  MRSA PCR Screening     Status: None   Collection Time: 05/11/14  6:48 AM  Result Value Ref Range Status   MRSA by PCR NEGATIVE NEGATIVE Final    Comment:        The GeneXpert MRSA Assay (FDA approved for NASAL specimens only), is one component of a comprehensive MRSA colonization surveillance program. It is not intended to diagnose MRSA infection nor to guide or monitor treatment for MRSA infections.     Anti-infectives    Start     Dose/Rate Route Frequency Ordered Stop   05/14/14 0000  imipenem-cilastatin (PRIMAXIN) 500 mg in sodium chloride 0.9 % 100 mL IVPB  Status:  Discontinued     500 mg200 mL/hr over 30 Minutes Intravenous 4 times per day 05/13/14 2142 05/16/14 1144   05/12/14 1800  imipenem-cilastatin (PRIMAXIN) 500 mg in sodium chloride 0.9 % 100 mL IVPB  Status:  Discontinued     500 mg200 mL/hr over 30 Minutes Intravenous 4 times per day 05/12/14 1307 05/13/14 2138   05/11/14 2000  vancomycin (VANCOCIN) 1,250 mg in sodium chloride 0.9 % 250 mL IVPB  Status:  Discontinued     1,250 mg166.7 mL/hr over 90 Minutes Intravenous Every 12 hours 05/11/14 1920 05/12/14 1055   05/11/14 2000  imipenem-cilastatin (PRIMAXIN) 500 mg in sodium  chloride 0.9 % 100 mL IVPB  Status:  Discontinued     500 mg200 mL/hr over 30 Minutes Intravenous 3 times per day 05/11/14 1920 05/12/14 1307   05/10/14 2100  vancomycin (VANCOCIN) 1,250 mg in sodium chloride 0.9 % 250 mL IVPB  Status:  Discontinued     1,250 mg166.7 mL/hr over 90 Minutes Intravenous Every 12 hours 05/10/14 0753 05/11/14 0816   05/10/14 0830  vancomycin (VANCOCIN) 2,500 mg in sodium chloride 0.9 % 500 mL  IVPB     2,500 mg250 mL/hr over 120 Minutes Intravenous NOW 05/10/14 0752 05/10/14 1128   05/10/14 0830  ceFEPIme (MAXIPIME) 1 g in dextrose 5 % 50 mL IVPB  Status:  Discontinued     1 g100 mL/hr over 30 Minutes Intravenous 3 times per day 05/10/14 0752 05/11/14 0816      Scheduled Meds:  Scheduled Meds: . antiseptic oral rinse  7 mL Mouth Rinse BID  . atorvastatin  20 mg Oral QHS  . docusate sodium  100 mg Oral BID  . doxazosin  2 mg Oral QAC breakfast  . feeding supplement (GLUCERNA SHAKE)  237 mL Oral BID BM  . feeding supplement (GLUCERNA SHAKE)  237 mL Oral TID BM  . gabapentin  200 mg Oral QHS  . insulin aspart  0-20 Units Subcutaneous TID WC  . insulin aspart  0-5 Units Subcutaneous QHS  . insulin aspart  10 Units Subcutaneous TID WC  . insulin glargine  46 Units Subcutaneous BID  . lisinopril  10 mg Oral Daily  . metoprolol  50 mg Oral BID  . pantoprazole  40 mg Oral BID  . polyethylene glycol  17 g Oral Daily  . sodium chloride  3 mL Intravenous Q12H    Time spent on care of this patient: 35 mins   Karen Kitchens  [facility] Office  [phone] Pager - Text Page per Shea Evans as per below:  On-Call/Text Page:      [email or web address]      password [facility]  05/16/2014, 11:48 AM   LOS: 7 days        Patient seen and examined, chart and data base reviewed.  I agree with the above assessment and plan and have edited the above note  For full details please see Mrs. Imogene Burn PA note.  Marzetta Board, MD [facility] [phone]

## 2014-05-16 NOTE — Progress Notes (Signed)
Bilateral upper extremity venous duplex completed:  No evidence of DVT.  There appears to be superficial thrombosis in the left basilic (around the PICC) and bilateral cephalic veins.

## 2014-05-16 NOTE — Plan of Care (Signed)
Problem: Phase II Progression Outcomes Goal: Progress activity as tolerated unless otherwise ordered Outcome: Progressing     

## 2014-05-16 NOTE — Progress Notes (Signed)
Inpatient Diabetes Program Recommendations  AACE/ADA: New Consensus Statement on Inpatient Glycemic Control (2013)  Target Ranges:  Prepandial:   less than 140 mg/dL      Peak postprandial:   less than 180 mg/dL (1-2 hours)      Critically ill patients:  140 - 180 mg/dL     Results for [name], [name] (MRN [id number]) as of 05/16/2014 09:44  Ref. Range 05/15/2014 08:03 05/15/2014 12:36 05/15/2014 17:21 05/15/2014 21:48  Glucose-Capillary Latest Range: 70-99 mg/dL 230 (H) 255 (H) 406 (H) 267 (H)    Results for [name], [name] (MRN [id number]) as of 05/16/2014 09:44  Ref. Range 05/16/2014 07:51  Glucose-Capillary Latest Range: 70-99 mg/dL 251 (H)     Home DM Meds: 70/30 insulin- 80 units with breakfast/ 90 units with supper       Metformin 500 mg Qsupper   Current Insulin Orders: Lantus 46 units bid      Novolog 10 units tidwc      Novolog Resistant SSI tid ac + HS    MD- Patient takes extremely large doses of 70/30 insulin at home.    Please consider the following in-hospital insulin adjustments:  1. Increase Lantus to 50 units bid 2. Increase Novolog Meal Coverage to Novolog 15 units tid with meals     Will follow Wyn Quaker RN, MSN, CDE Diabetes Coordinator Inpatient Diabetes Program Team Pager: [phone] (8a-10p)

## 2014-05-16 NOTE — Progress Notes (Signed)
MD notified due to acute change in patient status. Vital signs and labs are listed below.  MD notified(1st page) Time of 1st page:  [name] Responding MD:  [place] Time MD responded: [name] Mane MD response: MD stated aware  Vital Signs Filed Vitals:   05/15/14 1419 05/15/14 2046 05/16/14 0524 05/16/14 1409  BP: 113/57 118/54 114/50 115/40  Pulse:  104 87 97  Temp: 99.1 F (37.3 C) 98.9 F (37.2 C) 99.8 F (37.7 C) 102.7 F (39.3 C)  TempSrc: Oral Oral Oral Oral  Resp: 15 22 22 18   Height:      Weight:      SpO2: 94% 95% 93% 91%     Lab Results WBC  Date/Time Value Ref Range Status  05/15/2014 04:40 PM 15.6* 4.0 - 10.5 K/uL Final  05/15/2014 02:20 AM 16.2* 4.0 - 10.5 K/uL Final  05/14/2014 04:09 AM 20.0* 4.0 - 10.5 K/uL Final   NEUTROPHILS RELATIVE %  Date/Time Value Ref Range Status  05/09/2014 08:37 PM 82* 43 - 77 % Final  05/09/2014 07:55 AM 80* 43 - 77 % Final  10/03/2012 08:06 PM 49 43 - 77 % Final   No results found for: PCO2ART LACTIC ACID, VENOUS  Date/Time Value Ref Range Status  05/10/2014 12:46 AM 1.3 0.5 - 2.2 mmol/L Final  05/09/2014 12:09 PM 2.17 0.5 - 2.2 mmol/L Final  05/09/2014 09:21 AM 1.35 0.5 - 2.2 mmol/L Final   No results found for: PCO2VEN   [name] L, RN 05/16/2014, 3:09 PM

## 2014-05-17 ENCOUNTER — Inpatient Hospital Stay (HOSPITAL_COMMUNITY): Payer: Medicare Other

## 2014-05-17 DIAGNOSIS — C259 Malignant neoplasm of pancreas, unspecified: Secondary | ICD-10-CM

## 2014-05-17 DIAGNOSIS — I5033 Acute on chronic diastolic (congestive) heart failure: Secondary | ICD-10-CM

## 2014-05-17 DIAGNOSIS — R531 Weakness: Secondary | ICD-10-CM | POA: Insufficient documentation

## 2014-05-17 HISTORY — DX: Malignant neoplasm of pancreas, unspecified: C25.9

## 2014-05-17 LAB — COMPREHENSIVE METABOLIC PANEL
ALT: 33 U/L (ref 0–53)
ANION GAP: 14 (ref 5–15)
AST: 30 U/L (ref 0–37)
Albumin: 1.6 g/dL — ABNORMAL LOW (ref 3.5–5.2)
Alkaline Phosphatase: 66 U/L (ref 39–117)
BUN: 19 mg/dL (ref 6–23)
CALCIUM: 8.1 mg/dL — AB (ref 8.4–10.5)
CO2: 20 meq/L (ref 19–32)
CREATININE: 1.06 mg/dL (ref 0.50–1.35)
Chloride: 104 mEq/L (ref 96–112)
GFR, EST AFRICAN AMERICAN: 79 mL/min — AB (ref >90)
GFR, EST NON AFRICAN AMERICAN: 68 mL/min — AB (ref >90)
GLUCOSE: 284 mg/dL — AB (ref 70–99)
Potassium: 4.4 mEq/L (ref 3.7–5.3)
Sodium: 138 mEq/L (ref 137–147)
TOTAL PROTEIN: 5.1 g/dL — AB (ref 6.0–8.3)
Total Bilirubin: 0.6 mg/dL (ref 0.3–1.2)

## 2014-05-17 LAB — CBC
HCT: 28.3 % — ABNORMAL LOW (ref 39.0–52.0)
HEMOGLOBIN: 9.3 g/dL — AB (ref 13.0–17.0)
MCH: 30.2 pg (ref 26.0–34.0)
MCHC: 32.9 g/dL (ref 30.0–36.0)
MCV: 91.9 fL (ref 78.0–100.0)
Platelets: 247 10*3/uL (ref 150–400)
RBC: 3.08 MIL/uL — AB (ref 4.22–5.81)
RDW: 14.8 % (ref 11.5–15.5)
WBC: 23.8 10*3/uL — AB (ref 4.0–10.5)

## 2014-05-17 LAB — GLUCOSE, CAPILLARY
GLUCOSE-CAPILLARY: 291 mg/dL — AB (ref 70–99)
GLUCOSE-CAPILLARY: 316 mg/dL — AB (ref 70–99)
Glucose-Capillary: 271 mg/dL — ABNORMAL HIGH (ref 70–99)
Glucose-Capillary: 249 mg/dL — ABNORMAL HIGH (ref 70–99)

## 2014-05-17 LAB — CBC WITH DIFFERENTIAL/PLATELET
Basophils Absolute: 0.1 10*3/uL (ref 0.0–0.1)
Basophils Relative: 0 % (ref 0–1)
Eosinophils Absolute: 0.1 10*3/uL (ref 0.0–0.7)
Eosinophils Relative: 0 % (ref 0–5)
HEMATOCRIT: 27.4 % — AB (ref 39.0–52.0)
Hemoglobin: 9.4 g/dL — ABNORMAL LOW (ref 13.0–17.0)
LYMPHS ABS: 1.9 10*3/uL (ref 0.7–4.0)
LYMPHS PCT: 8 % — AB (ref 12–46)
MCH: 31.6 pg (ref 26.0–34.0)
MCHC: 34.3 g/dL (ref 30.0–36.0)
MCV: 92.3 fL (ref 78.0–100.0)
MONO ABS: 2.3 10*3/uL — AB (ref 0.1–1.0)
Monocytes Relative: 9 % (ref 3–12)
Neutro Abs: 20.7 10*3/uL — ABNORMAL HIGH (ref 1.7–7.7)
Neutrophils Relative %: 83 % — ABNORMAL HIGH (ref 43–77)
Platelets: 243 10*3/uL (ref 150–400)
RBC: 2.97 MIL/uL — AB (ref 4.22–5.81)
RDW: 14.6 % (ref 11.5–15.5)
WBC: 25 10*3/uL — AB (ref 4.0–10.5)

## 2014-05-17 LAB — HIV ANTIBODY (ROUTINE TESTING W REFLEX): HIV: NONREACTIVE

## 2014-05-17 MED ORDER — SODIUM CHLORIDE 0.9 % IV SOLN
INTRAVENOUS | Status: DC
  Administered 2014-05-17 – 2014-05-18 (×2): via INTRAVENOUS

## 2014-05-17 MED ORDER — LIDOCAINE-EPINEPHRINE 1 %-1:100000 IJ SOLN
INTRAMUSCULAR | Status: AC
  Filled 2014-05-17: qty 1

## 2014-05-17 MED ORDER — FENTANYL CITRATE 0.05 MG/ML IJ SOLN
INTRAMUSCULAR | Status: AC
  Filled 2014-05-17: qty 4

## 2014-05-17 MED ORDER — MIDAZOLAM HCL 2 MG/2ML IJ SOLN
INTRAMUSCULAR | Status: AC | PRN
  Administered 2014-05-17: 1 mg via INTRAVENOUS
  Administered 2014-05-17: 0.5 mg via INTRAVENOUS

## 2014-05-17 MED ORDER — DIPHENHYDRAMINE HCL 50 MG PO CAPS
50.0000 mg | ORAL_CAPSULE | Freq: Every day | ORAL | Status: DC
  Administered 2014-05-17 – 2014-05-18 (×2): 50 mg via ORAL
  Filled 2014-05-17 (×2): qty 1
  Filled 2014-05-17: qty 2
  Filled 2014-05-17: qty 1

## 2014-05-17 MED ORDER — MIDAZOLAM HCL 2 MG/2ML IJ SOLN
INTRAMUSCULAR | Status: AC
  Filled 2014-05-17: qty 4

## 2014-05-17 MED ORDER — FENTANYL CITRATE 0.05 MG/ML IJ SOLN
INTRAMUSCULAR | Status: AC | PRN
  Administered 2014-05-17 (×2): 25 ug via INTRAVENOUS

## 2014-05-17 NOTE — Progress Notes (Signed)
ANTIBIOTIC CONSULT NOTE - FOLLOW UP  Pharmacy Consult: Primaxin Indication:  Sepsis  Allergies  Allergen Reactions  . Clindamycin/Lincomycin Other (See Comments)    dysphagia  . Codeine Itching  . Penicillins Rash    Patient Measurements: Height: 6\' 5"  (195.6 cm) Weight: 298 lb (135.172 kg) (scale b) IBW/kg (Calculated) : 89.1  Vital Signs: Temp: 100.1 F (37.8 C) (12/03 0549) Temp Source: Oral (12/03 0549) BP: 110/78 mmHg (12/03 1126) Pulse Rate: 98 (12/03 1107) Intake/Output from previous day: 12/02 0701 - 12/03 0700 In: 273.2 [P.O.:30; I.V.:243.2] Out: 701 [Urine:700; Stool:1]  Labs:  Recent Labs  05/15/14 0220 05/15/14 1640 05/17/14 0600 05/17/14 0800  WBC 16.2* 15.6* 23.8* 25.0*  HGB 8.4* 9.2* 9.3* 9.4*  PLT 232 237 247 243  CREATININE 1.01  --  1.06  --    Estimated Creatinine Clearance: 95.8 mL/min (by C-G formula based on Cr of 1.06). No results for input(s): VANCOTROUGH, VANCOPEAK, VANCORANDOM, GENTTROUGH, GENTPEAK, GENTRANDOM, TOBRATROUGH, TOBRAPEAK, TOBRARND, AMIKACINPEAK, AMIKACINTROU, AMIKACIN in the last 72 hours.   Microbiology: Recent Results (from the past 720 hour(s))  Blood Culture (routine x 2)     Status: None   Collection Time: 05/09/14  9:10 AM  Result Value Ref Range Status   Specimen Description BLOOD LEFT FOREARM  Final   Special Requests BOTTLES DRAWN AEROBIC AND ANAEROBIC 5CC  Final   Culture  Setup Time   Final    05/09/2014 18:03 Performed at [facility]    Culture   Final    NO GROWTH 5 DAYS Performed at [facility]    Report Status 05/15/2014 FINAL  Final  Urine culture     Status: None   Collection Time: 05/09/14  9:55 AM  Result Value Ref Range Status   Specimen Description URINE, CLEAN CATCH  Final   Special Requests NONE  Final   Culture  Setup Time   Final    05/09/2014 18:23 Performed at [place] Performed at [facility]   Final   Culture NO  GROWTH Performed at [facility]   Final   Report Status 05/10/2014 FINAL  Final  Blood culture (routine x 2)     Status: None   Collection Time: 05/09/14 11:40 AM  Result Value Ref Range Status   Specimen Description BLOOD LEFT HAND  Final   Special Requests BOTTLES DRAWN AEROBIC ONLY 3CCS  Final   Culture  Setup Time   Final    05/09/2014 18:03 Performed at [facility]    Culture   Final    NO GROWTH 5 DAYS Performed at [facility]    Report Status 05/15/2014 FINAL  Final  MRSA PCR Screening     Status: None   Collection Time: 05/11/14  6:48 AM  Result Value Ref Range Status   MRSA by PCR NEGATIVE NEGATIVE Final    Comment:        The GeneXpert MRSA Assay (FDA approved for NASAL specimens only), is one component of a comprehensive MRSA colonization surveillance program. It is not intended to diagnose MRSA infection nor to guide or monitor treatment for MRSA infections.   Culture, blood (routine x 2)     Status: None (Preliminary result)   Collection Time: 05/16/14  3:25 PM  Result Value Ref Range Status   Specimen Description BLOOD RIGHT HAND  Final   Special Requests BOTTLES DRAWN AEROBIC AND ANAEROBIC 10CCS  Final  Culture  Setup Time   Final    05/16/2014 21:17 Performed at [facility]    Culture   Final           BLOOD CULTURE RECEIVED NO GROWTH TO DATE CULTURE WILL BE HELD FOR 5 DAYS BEFORE ISSUING A FINAL NEGATIVE REPORT Performed at [facility]    Report Status PENDING  Incomplete  Culture, blood (routine x 2)     Status: None (Preliminary result)   Collection Time: 05/16/14  3:40 PM  Result Value Ref Range Status   Specimen Description BLOOD RIGHT FOREARM  Final   Special Requests BOTTLES DRAWN AEROBIC AND ANAEROBIC 10CC  Final   Culture  Setup Time   Final    05/16/2014 21:17 Performed at [facility]    Culture   Final           BLOOD CULTURE RECEIVED NO GROWTH TO DATE CULTURE WILL BE HELD FOR 5  DAYS BEFORE ISSUING A FINAL NEGATIVE REPORT Performed at [facility]    Report Status PENDING  Incomplete    Medical History: Past Medical History  Diagnosis Date  . Diabetes mellitus   . Hyperlipidemia   . Hypertension   . GERD (gastroesophageal reflux disease)   . Coronary artery disease     a. S/P prior PTCA in the 90's;  b. 04/2007 Cath: LM nl, LAD Ca2+ prox, LCX nl, RI nl, OM nl, RCA 40-50p, 40d, EF 60%;  b. 09/2012 Echo: EF 55-60%, mild LVH, nl wall motion, Gr 1 DD, mildly dil LA.  . Right ureteral stone   . SVT (supraventricular tachycardia)     a. s/p ablation per Dr. Lovena Le 10 -79yrs ago  . OSA on CPAP     a. cpap setting of 16  . Arthritis   . Bilateral kidney stones   . Shortness of breath dyspnea   . CKD (chronic kidney disease), stage III      Assessment: 73 y/o male who presented to the ED on 11/25 with CP, SOB, orthostatic hypotension for 2 weeks and persistent leukocytosis. Antibiotics were not started initially, but pharmacy ultimately consulted to start vancomycin and imipenem.   He has continued fevers and leukocytosis.  Primaxin was resumed 12/2.  Cultures neg to date  Goal of Therapy:  Renal adjustment of antibiotics.  Plan:  - Primaxin 500mg  IV Q6H - Follow up SCr, UOP, cultures, clinical course and adjust as clinically indicated.   Thank you for allowing pharmacy to be a part of this patients care team. Excell Seltzer, PharmD Clinical Pharmacist 05/17/2014 1:08 PM

## 2014-05-17 NOTE — Sedation Documentation (Signed)
Patient denies pain and is resting comfortably.  

## 2014-05-17 NOTE — Progress Notes (Signed)
Progress Note  GAMBLE ENDERLE OQH:[id number] DOB: [birth year] DOA: 05/09/2014 PCP: [name], MD  Admit HPI / Brief Narrative: 73 year old male with known history of obstructive sleep apnea, CAD/remote PCI, diastolic CHF, remote SVT status post ablation, and recent plans for carpal tunnel surgery canceled due to poorly controlled blood sugar who presented to the ED due to palpitations, chest pain, and dizziness.  The patient was found to be orthostatic.  His cardiac enzymes were negative.  During his first night in the hospital (05/10/16) the patient spiked a fever over 103.  He denied cough, had no dysuria, chest x-ray was negative, urinalysis was negative, and leukocytosis on admission of 26K had improved to 17K.  He was started empirically on IV vancomycin and cefepime pending septic workup.  CT chest abdomen and pelvis was significant for a mass in the region of the pancreatic head.  He was seen by GI.  Antibiotics were stopped on 1127/15 as no focus of infection could be identified.  During his second night in the hospital (05/11/14) the patient developed hematochezia (known to have a history of diverticulosis) w/ a drop off his hemoglobin from 10.9 on admission to 7.9.  He was transfused packed red blood cells.  Additionally, the patient spiked a fever again, w/ worsening leukocytosis, so empiric IV antibiotic coverage with primaxin was resumed.  An EGD was accomplished 11/28, revealing a significant tumor impinging upon the duodenum.  Bxs were obtained, but bleeding limited the extent of the tissue sampling.  Path results are inconclusive.   On 12/2 the patient spiked a fever once again and his wbc began to climb.  Blood cultures were drawn.  U/A and CXR were negative for infection.  He underwent CT guided biopsy of an enlarged peripancreatic lymph node on 12/3.  We have requested a rush on the pathology results.  Subjective: Was dizzy rising from bed this morning.  BM yesterday was soft  and green  Urine has been dark.  Requests a sleeping pill tonight - reports very little sleep since he has been here.  No other complaints..  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   Bx results were inconclusive.  IR CT Guided Bx completed 12/3.  Results pending.  CA 19-9 was actually low.  Oncology was consulted 12/1.  Plan is to remain in hospital until diagnosis and tx plan are complete.    GI bleed - Acute painless hematochezia 2 episodes of BRBPR -  Aspirin held - Lovenox stopped.  Patient had several days of black stool. Significant drop in hemoglobin 10.9 > 7.8.  He was transfused but continued to have melana (1 black stool on 11/30, and a 2nd on 12/1). Has been transfused a total of 3 units.  Tumor itself was ulcerated, and is likely the source of his blood loss. 12/3 hgb stable.  Stool green.  Hopefully the bleeding has stopped.   SIRS - FUO Tmax 103 05/10/14 w/ leukocytosis of 17,000 - fever curve was trending down, but spiked a fever of 102.7 on 12/2 after being on primaxin. UA and cultures are negative - CXR no acute finding - flu PCR negative  Blood cultures are negative and final.  Repeat cultures drawn 12/2. Repeat CXR 11/29 w/o infiltrate ? Lymphoma - check C diff also given leukocytosis  Acute blood loss anemia  Due to GIB  Transfused 3units PRBC.   - Hb stable now  Diabetes mellitus Severely uncontrolled - required IV glucose stabilizer at admission - gtt stopped 11/28 - CBG poorly controlled.  Adjust tx further today and follow - A1c 9.8 On large doses of Lantus and novolog yet still has poor control.  Will increase Lantus to 50 bid, novolog 15 with meals and SSI resistant on 12/2.  Mixed hyperlipidemia Continue statin for now.  LFTs wnl  Chronic diastolic CHF  Clinically compensated.  LVEF 60 - 65% on  11/26.  CORONARY ATHEROSCLEROSIS NATIVE CORONARY ARTERY Currently denies chest pain - negative troponin 3  Hypomagnesemia Replaced.  Acute renal failure Resolved w/ volume resuscitation   OSA  CPAP compliant  Obesity - Body mass index is 35.33 kg/(m^2).  Hypoalbuminemia. Appreciate nutrition consult.  Added glucerna.  Upper extremity swelling. Right > left.  Patient reports multiple IVs.  Left upper ext with superficial clot around the PICC on doppler u/s.   Code Status: FULL Family Communication: spoke w/ wife at bedside. Disposition Plan: to home when appropriate  Consultants: GI Cardiology  Oncology Interventional Radiology  Procedures: EGD - 11/28 - results noted above  CT guilded biopsy 12/3.  Antibiotics: Cefepime 11/26 Vanc 11/26 > 11/28 Imipenem 11/27 >   DVT prophylaxis: SCDs as he has had bleeding.  Objective: Blood pressure 110/78, pulse 98, temperature 100.1 F (37.8 C), temperature source Oral, resp. rate 20, height 6\' 5"  (1.956 m), weight 135.172 kg (298 lb), SpO2 95 %.  Intake/Output Summary (Last 24 hours) at 05/17/14 1303 Last data filed at 05/17/14 1137  Gross per 24 hour  Intake 403.17 ml  Output    701 ml  Net -297.83 ml   Exam: General: Wd, Tall 6'6" male, NAD, A&O with clear speech.  Sitting in chair.  Appears fatigued. Lungs: Clear to auscultation bilaterally without wheezes or crackles Cardiovascular: regular rate with reg rhythm without murmur gallop or rub Abdomen: obese, Nontender, slightly distended, bowel sounds decreased, no appreciable mass Extremities: 1+ edema bilaterally in upper extremities.  No edema in LE.  Able to ambulate.  Data Reviewed: Basic Metabolic Panel:  Recent Labs Lab 05/12/14 0223 05/13/14 0230 05/14/14 0409 05/15/14 0220 05/17/14 0600  NA 136* 134* 135* 136* 138  K 4.3 4.6 4.5 3.9 4.4  CL 104 103 103 103 104  CO2 16* 17* 20 21 20   GLUCOSE 157* 320* 305* 159* 284*  BUN 16 17 21 21 19    CREATININE 0.94 1.14 1.12 1.01 1.06  CALCIUM 8.8 8.6 8.3* 8.5 8.1*  MG 1.3*  --  1.6  --   --   PHOS  --   --   --  3.5  --     Liver Function Tests:  Recent Labs Lab 05/13/14 0230 05/14/14 0409 05/15/14 0220 05/17/14 0600  AST 13 17  --  30  ALT 12 11  --  33  ALKPHOS 57 63  --  66  BILITOT 0.6 0.4  --  0.6  PROT 5.4* 5.0*  --  5.1*  ALBUMIN 1.7* 1.6* 1.5* 1.6*    Recent Labs Lab 05/13/14 0230  LIPASE 18   Coags:  Recent Labs Lab 05/14/14 0409  INR 1.24    Recent Labs Lab 05/14/14 0409  APTT 26    CBC:  Recent Labs Lab 05/14/14 0409 05/15/14 0220 05/15/14 1640 05/17/14 0600 05/17/14 0800  WBC 20.0* 16.2* 15.6* 23.8* 25.0*  NEUTROABS  --   --   --   --  20.7*  HGB 7.3* 8.4* 9.2* 9.3* 9.4*  HCT 22.0* 24.9* 26.9* 28.3* 27.4*  MCV 92.8 91.2 92.4 91.9 92.3  PLT 259 232 237 247 243    BNP (last 3 results)  Recent Labs  01/12/14 1244  PROBNP 67.7    CBG:  Recent Labs Lab 05/16/14 1144 05/16/14 1710 05/16/14 2136 05/17/14 0758 05/17/14 1203  GLUCAP 233* 267* 261* 271* 291*    Recent Results (from the past 240 hour(s))  Blood Culture (routine x 2)     Status: None   Collection Time: 05/09/14  9:10 AM  Result Value Ref Range Status   Specimen Description BLOOD LEFT FOREARM  Final   Special Requests BOTTLES DRAWN AEROBIC AND ANAEROBIC 5CC  Final   Culture  Setup Time   Final    05/09/2014 18:03 Performed at [facility]    Culture   Final    NO GROWTH 5 DAYS Performed at [facility]    Report Status 05/15/2014 FINAL  Final  Urine culture     Status: None   Collection Time: 05/09/14  9:55 AM  Result Value Ref Range Status   Specimen Description URINE, CLEAN CATCH  Final   Special Requests NONE  Final   Culture  Setup Time   Final    05/09/2014 18:23 Performed at [place] Performed at [facility]   Final   Culture NO GROWTH Performed at [facility]    Final   Report Status 05/10/2014 FINAL  Final  Blood culture (routine x 2)     Status: None   Collection Time: 05/09/14 11:40 AM  Result Value Ref Range Status   Specimen Description BLOOD LEFT HAND  Final   Special Requests BOTTLES DRAWN AEROBIC ONLY 3CCS  Final   Culture  Setup Time   Final    05/09/2014 18:03 Performed at [facility]    Culture   Final    NO GROWTH 5 DAYS Performed at [facility]    Report Status 05/15/2014 FINAL  Final  MRSA PCR Screening     Status: None   Collection Time: 05/11/14  6:48 AM  Result Value Ref Range Status   MRSA by PCR NEGATIVE NEGATIVE Final    Comment:        The GeneXpert MRSA Assay (FDA approved for NASAL specimens only), is one component of a comprehensive MRSA colonization surveillance program. It is not intended to diagnose MRSA infection nor to guide or monitor treatment for MRSA infections.   Culture, blood (routine x 2)     Status: None (Preliminary result)   Collection Time: 05/16/14  3:25 PM  Result Value Ref Range Status   Specimen Description BLOOD RIGHT HAND  Final   Special Requests BOTTLES DRAWN AEROBIC AND ANAEROBIC 10CCS  Final   Culture  Setup Time   Final    05/16/2014 21:17 Performed at [facility]    Culture   Final           BLOOD CULTURE RECEIVED NO GROWTH TO DATE CULTURE WILL BE HELD FOR 5 DAYS BEFORE ISSUING A FINAL NEGATIVE REPORT Performed at [facility]    Report Status PENDING  Incomplete  Culture, blood (routine x 2)     Status: None (Preliminary result)   Collection Time: 05/16/14  3:40 PM  Result Value Ref Range Status   Specimen Description BLOOD RIGHT FOREARM  Final   Special  Requests BOTTLES DRAWN AEROBIC AND ANAEROBIC 10CC  Final   Culture  Setup Time   Final    05/16/2014 21:17 Performed at [facility]    Culture   Final           BLOOD CULTURE RECEIVED NO GROWTH TO DATE CULTURE WILL BE HELD FOR 5 DAYS BEFORE ISSUING A FINAL NEGATIVE  REPORT Performed at [facility]    Report Status PENDING  Incomplete    Anti-infectives    Start     Dose/Rate Route Frequency Ordered Stop   05/16/14 1600  imipenem-cilastatin (PRIMAXIN) 500 mg in sodium chloride 0.9 % 100 mL IVPB     500 mg200 mL/hr over 30 Minutes Intravenous Every 6 hours 05/16/14 1516     05/14/14 0000  imipenem-cilastatin (PRIMAXIN) 500 mg in sodium chloride 0.9 % 100 mL IVPB  Status:  Discontinued     500 mg200 mL/hr over 30 Minutes Intravenous 4 times per day 05/13/14 2142 05/16/14 1144   05/12/14 1800  imipenem-cilastatin (PRIMAXIN) 500 mg in sodium chloride 0.9 % 100 mL IVPB  Status:  Discontinued     500 mg200 mL/hr over 30 Minutes Intravenous 4 times per day 05/12/14 1307 05/13/14 2138   05/11/14 2000  vancomycin (VANCOCIN) 1,250 mg in sodium chloride 0.9 % 250 mL IVPB  Status:  Discontinued     1,250 mg166.7 mL/hr over 90 Minutes Intravenous Every 12 hours 05/11/14 1920 05/12/14 1055   05/11/14 2000  imipenem-cilastatin (PRIMAXIN) 500 mg in sodium chloride 0.9 % 100 mL IVPB  Status:  Discontinued     500 mg200 mL/hr over 30 Minutes Intravenous 3 times per day 05/11/14 1920 05/12/14 1307   05/10/14 2100  vancomycin (VANCOCIN) 1,250 mg in sodium chloride 0.9 % 250 mL IVPB  Status:  Discontinued     1,250 mg166.7 mL/hr over 90 Minutes Intravenous Every 12 hours 05/10/14 0753 05/11/14 0816   05/10/14 0830  vancomycin (VANCOCIN) 2,500 mg in sodium chloride 0.9 % 500 mL IVPB     2,500 mg250 mL/hr over 120 Minutes Intravenous NOW 05/10/14 0752 05/10/14 1128   05/10/14 0830  ceFEPIme (MAXIPIME) 1 g in dextrose 5 % 50 mL IVPB  Status:  Discontinued     1 g100 mL/hr over 30 Minutes Intravenous 3 times per day 05/10/14 0752 05/11/14 0816      Scheduled Meds:  Scheduled Meds: . antiseptic oral rinse  7 mL Mouth Rinse BID  . atorvastatin  20 mg Oral QHS  . diphenhydrAMINE  50 mg Oral QHS  . doxazosin  2 mg Oral QAC breakfast  . feeding supplement (GLUCERNA  SHAKE)  237 mL Oral TID BM  . fentaNYL      . gabapentin  200 mg Oral QHS  . imipenem-cilastatin  500 mg Intravenous Q6H  . insulin aspart  0-20 Units Subcutaneous TID WC  . insulin aspart  0-5 Units Subcutaneous QHS  . insulin aspart  15 Units Subcutaneous TID WC  . insulin glargine  50 Units Subcutaneous BID  . lidocaine-EPINEPHrine      . lisinopril  10 mg Oral Daily  . metoprolol  50 mg Oral BID  . midazolam      . pantoprazole  40 mg Oral BID  . sodium chloride  3 mL Intravenous Q12H    Time spent on care of this patient: 35 mins   Karen Kitchens  [facility] Office  [phone] Pager - Text Page per Shea Evans as per  below:  On-Call/Text Page:      [email or web address]      password [facility]  05/17/2014, 1:03 PM   LOS: 8 days    Patient seen and examined, chart and data base reviewed.  I agree with the above assessment and plan and have edited the above note  For full details please see Mrs. Imogene Burn PA note.  Marzetta Board, MD [facility] [phone]

## 2014-05-17 NOTE — Plan of Care (Signed)
Problem: Phase II Progression Outcomes Goal: Progress activity as tolerated unless otherwise ordered Outcome: Progressing Goal: Vital signs remain stable Outcome: Completed/Met Date Met:  05/17/14  Problem: Discharge Progression Outcomes Goal: Hemodynamically stable Outcome: Completed/Met Date Met:  05/17/14

## 2014-05-17 NOTE — Progress Notes (Signed)
RT called to patients room to assist with CPAP. Pt resting comfortably.

## 2014-05-17 NOTE — Sedation Documentation (Signed)
Awaiting transport.

## 2014-05-17 NOTE — Procedures (Signed)
Technically successful CT guided biopsy of peri-pancreatic/doudenal lymph node.  No immediate post procedural complications.

## 2014-05-17 NOTE — Progress Notes (Signed)
S/p perc biopsy of pancreatic mass vs associated lymph nodes. Will follow up pathology.  Azucena Freed  PA-C Wadsworth Fuller Plan MD

## 2014-05-17 NOTE — Progress Notes (Signed)
PT Cancellation Note  Patient Details Name: [name] MRN: [id number] DOB: [birth year]   Cancelled Treatment:    Reason Eval/Treat Not Completed: Patient declined, no reason specified.  All I want right now is sleep.  Will return to see pt as able.  05/17/2014  Donnella Sham, [place] [phone]  (pager)   Grayland Daisey, Tessie Fass 05/17/2014, 4:54 PM

## 2014-05-18 DIAGNOSIS — C801 Malignant (primary) neoplasm, unspecified: Secondary | ICD-10-CM

## 2014-05-18 DIAGNOSIS — A047 Enterocolitis due to Clostridium difficile: Secondary | ICD-10-CM

## 2014-05-18 DIAGNOSIS — D72829 Elevated white blood cell count, unspecified: Secondary | ICD-10-CM

## 2014-05-18 DIAGNOSIS — A0472 Enterocolitis due to Clostridium difficile, not specified as recurrent: Secondary | ICD-10-CM | POA: Insufficient documentation

## 2014-05-18 LAB — CBC
HCT: 27.6 % — ABNORMAL LOW (ref 39.0–52.0)
HEMOGLOBIN: 9.1 g/dL — AB (ref 13.0–17.0)
MCH: 30.2 pg (ref 26.0–34.0)
MCHC: 33 g/dL (ref 30.0–36.0)
MCV: 91.7 fL (ref 78.0–100.0)
PLATELETS: 208 10*3/uL (ref 150–400)
RBC: 3.01 MIL/uL — AB (ref 4.22–5.81)
RDW: 14.6 % (ref 11.5–15.5)
WBC: 18.7 10*3/uL — ABNORMAL HIGH (ref 4.0–10.5)

## 2014-05-18 LAB — GLUCOSE, CAPILLARY
GLUCOSE-CAPILLARY: 187 mg/dL — AB (ref 70–99)
Glucose-Capillary: 286 mg/dL — ABNORMAL HIGH (ref 70–99)
Glucose-Capillary: 272 mg/dL — ABNORMAL HIGH (ref 70–99)
Glucose-Capillary: 198 mg/dL — ABNORMAL HIGH (ref 70–99)

## 2014-05-18 MED ORDER — VANCOMYCIN 50 MG/ML ORAL SOLUTION: 125.0000 mg | Freq: Four times a day (QID) | ORAL | Status: AC

## 2014-05-18 MED ORDER — INSULIN GLARGINE 100 UNIT/ML ~~LOC~~ SOLN
55.0000 [IU] | Freq: Two times a day (BID) | SUBCUTANEOUS | Status: DC
  Administered 2014-05-18 – 2014-05-19 (×2): 55 [IU] via SUBCUTANEOUS
  Filled 2014-05-18 (×3): qty 0.55

## 2014-05-18 MED ORDER — INSULIN ASPART 100 UNIT/ML ~~LOC~~ SOLN
17.0000 [IU] | Freq: Three times a day (TID) | SUBCUTANEOUS | Status: DC
  Administered 2014-05-18 – 2014-05-19 (×2): 17 [IU] via SUBCUTANEOUS

## 2014-05-18 MED ORDER — VANCOMYCIN 50 MG/ML ORAL SOLUTION
125.0000 mg | Freq: Four times a day (QID) | ORAL | Status: DC
  Administered 2014-05-18 – 2014-05-19 (×4): 125 mg via ORAL
  Filled 2014-05-18 (×11): qty 2.5

## 2014-05-18 MED ORDER — HYDROCOD POLST-CHLORPHEN POLST 10-8 MG/5ML PO LQCR
ORAL | Status: AC
  Filled 2014-05-18: qty 5

## 2014-05-18 NOTE — Progress Notes (Signed)
Physical Therapy Treatment Patient Details Name: [name] MRN: [id number] DOB: 04-04-1941 Today's Date: 05/18/2014    History of Present Illness 73 y.o. male admitted with Palpitations/chest pain/shortness of breath, and orthostatic hypotension. 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    PT Comments    Patient continues with weakness, SOB with ambulation and loss of balance when turning to look around while walking with walker.  Feel he may benefit from HHPT to continue to address issues at home and ensure safety in home environment.  Wife to return to work Monday, anticipated d/c Saturday.  Feel will be safe with walker for in home ambulation by then.  Follow Up Recommendations  Home health PT;Supervision for mobility/OOB     Equipment Recommendations  None recommended by PT (already has walker at home)    Recommendations for Other Services       Precautions / Restrictions Precautions Precautions: Fall    Mobility  Bed Mobility Overal bed mobility: Modified Independent             General bed mobility comments: effortful to sit up and lie down (assist to scoot to head of bed)  Transfers Overall transfer level: Needs assistance Equipment used: Rolling walker (2 wheeled) Transfers: Sit to/from Stand Sit to Stand: Min guard         General transfer comment: assist for safety, mildly unsteady on his feet  Ambulation/Gait Ambulation/Gait assistance: Min assist;Min guard Ambulation Distance (Feet): 130 Feet Assistive device: Rolling walker (2 wheeled) Gait Pattern/deviations: Step-through pattern;Decreased stride length;Wide base of support     General Gait Details: loss of balance lateral when turning to look to sides requiring min assist for recovery; SOB after ambulation   Stairs            Wheelchair Mobility    Modified Rankin (Stroke Patients Only)        Balance           Standing balance support: Bilateral upper extremity supported Standing balance-Leahy Scale: Fair Standing balance comment: can stand unsupported, but loss of balance with walker when turning to look to side                    Cognition Arousal/Alertness: Awake/alert Behavior During Therapy: WFL for tasks assessed/performed Overall Cognitive Status: Within Functional Limits for tasks assessed                      Exercises      General Comments        Pertinent Vitals/Pain Pain Assessment: No/denies pain    Home Living                      Prior Function            PT Goals (current goals can now be found in the care plan section) Progress towards PT goals: Progressing toward goals    Frequency  Min 3X/week    PT Plan Discharge plan needs to be updated    Co-evaluation             End of Session Equipment Utilized During Treatment: Gait belt Activity Tolerance: Patient limited by fatigue Patient left: in bed;with call bell/phone within reach;with family/visitor present     Time: 6301-6010 PT Time Calculation (min) (ACUTE ONLY): 28 min  Charges:  $Gait Training: 8-22 mins $Self Care/Home Management: 8-22  G Codes:      WYNN,CYNDI 05/18/2014, 4:35 PM Magda Kiel, [place] 05/18/2014

## 2014-05-18 NOTE — Progress Notes (Signed)
05/18/14 [place], BSN [phone] NCM received referral for po vanc for patient who will be dc tomorrow, received script from MD and will fax to [place] to see what cost is, our pharmacy will be closed on Saturday.

## 2014-05-18 NOTE — Progress Notes (Signed)
Pt. Has already placed himself on his home CPAP via [facility] & is tolerating it well at this time without any complications.

## 2014-05-18 NOTE — Progress Notes (Signed)
Daily Rounding Note  05/18/2014, 11:03 AM  LOS: 9 days   SUBJECTIVE:       Note C diff + stool.  Pt has had abx in recent months.   No abdominal pain, no n/v.  Appetite somewhat depressed  OBJECTIVE:         Vital signs in last 24 hours:    Temp:  [97.4 F (36.3 C)-99.9 F (37.7 C)] 98 F (36.7 C) (12/04 0445) Pulse Rate:  [80-98] 94 (12/04 0445) Resp:  [16-24] 18 (12/04 0445) BP: (77-130)/(37-78) 130/57 mmHg (12/04 0957) SpO2:  [95 %-100 %] 95 % (12/04 0445) Last BM Date: 05/17/14 Filed Weights   05/09/14 0724 05/09/14 1236 05/10/14 0559  Weight: 290 lb (131.543 kg) 296 lb 4.8 oz (134.4 kg) 298 lb (135.172 kg)   General: looks chronically unwell.    Heart: RRR Chest: clear bil.  No cough or dyspnea at rest Abdomen: obese, soft, BS present.  Not tender  Extremities: 1+ pedal edema.   Neuro/Psych:  Oriented x 3.  Alert. Relaxed.   Intake/Output from previous day: 12/03 0701 - 12/04 0700 In: 150 [P.O.:120; I.V.:30] Out: 200 [Urine:200]  Intake/Output this shift: Total I/O In: -  Out: 300 [Urine:300]  Lab Results:  Recent Labs  05/17/14 0600 05/17/14 0800 05/18/14 0605  WBC 23.8* 25.0* 18.7*  HGB 9.3* 9.4* 9.1*  HCT 28.3* 27.4* 27.6*  PLT 247 243 208   BMET  Recent Labs  05/17/14 0600  NA 138  K 4.4  CL 104  CO2 20  GLUCOSE 284*  BUN 19  CREATININE 1.06  CALCIUM 8.1*   LFT  Recent Labs  05/17/14 0600  PROT 5.1*  ALBUMIN 1.6*  AST 30  ALT 33  ALKPHOS 66  BILITOT 0.6   PT/INR No results for input(s): LABPROT, INR in the last 72 hours. Hepatitis Panel No results for input(s): HEPBSAG, HCVAB, HEPAIGM, HEPBIGM in the last 72 hours.  Studies/Results: Dg Chest 2 View  05/17/2014   CLINICAL DATA:  Shortness of breath and fever. History of hypertension and diabetes.  EXAM: CHEST  2 VIEW  COMPARISON:  05/13/2014; 05/09/2014; chest CT - 05/10/2014  FINDINGS: Grossly unchanged enlarged  cardiac silhouette and mediastinal contours with minimal atherosclerotic plaque within the thoracic aorta. The lungs remain hyperexpanded. There is blunting of the bilateral costophrenic angles suggestive of trace bilateral pleural effusions. Unchanged bibasilar heterogeneous opacities, left greater than right, likely atelectasis. No new focal airspace opacities. No evidence of edema. No pneumothorax. Unchanged bones.  IMPRESSION: 1. Cardiomegaly and suspected trace bilateral effusions without evidence of edema. 2. Unchanged bibasilar opacities, left greater than right, favored to represent atelectasis. No discrete focal airspace opacities to suggest pneumonia.   Electronically Signed   By: Sandi Mariscal M.D.   On: 05/17/2014 09:41   Ct Biopsy  05/17/2014   INDICATION: History of duodenal mass. Patient underwent nondiagnostic endoscopic biopsy and presents now for CT-guided biopsy of a peripancreatic/duodenal lymph node for tissue diagnostic purposes  EXAM: CT GUIDED BIOPSY OF PERIPANCREATIC/DUODENAL LYMPH NODE  COMPARISON:  CT the abdomen pelvis - 05/10/2014  MEDICATIONS: Fentanyl 50 mcg IV; Versed 1.5 mg IV  ANESTHESIA/SEDATION: Sedation time  20 minutes  CONTRAST:  None  COMPLICATIONS: None immediate  PROCEDURE: Informed consent was obtained from the patient following an explanation of the procedure, risks, benefits and alternatives. A time out was performed prior to the initiation of the procedure.  The patient was positioned right  lateral decubitus on the CT table and a limited CT was performed for procedural planning demonstrating unchanged size and appearance of previously identified approximately 2.2 x 1.9 cm enlarged peripancreatic/duodenal lymph node immediately adjacent to the large intraluminal duodenal mass (image 40, series 2). The procedure was planned. The operative site was prepped and draped in the usual sterile fashion. Appropriate trajectory was confirmed with a 22 gauge spinal needle after the  adjacent tissues were anesthetized with 1% Lidocaine with epinephrine.  Under intermittent CT guidance, a 17 gauge coaxial needle was advanced adjacent to the peripheral aspect of the lymph node (note, given patient body habitus, the coaxial needle was still approximately 2.3 cm too short for purchase directly within in the enlarged lymph node (representative image 2, series 6).  4 core needle samples were obtained with an 18 gauge core needle biopsy device while slightly advancing the biopsy device system. The co-axial needle was removed and hemostasis was achieved with manual compression.  A limited postprocedural CT was negative for hemorrhage or additional complication. A dressing was placed. The patient tolerated the procedure well without immediate postprocedural complication.  IMPRESSION: Technically successful CT guided core needle biopsy of dominant peripancreatic/duodenal lymph node.   Electronically Signed   By: Sandi Mariscal M.D.   On: 05/17/2014 10:55    ASSESMENT:   * GI bleed. Duodenal/pancretic head mass. 20 # weight loss.  05/12/14 EGD: Compressing duodenal mass with central ulcer, bled upon shallow biopsy. Not clear if this is primary duodenal vs extrinsic pancreatic head mass.  Pathology shows eroded duodenal mucosa. No malignancy. 12/3 CT guided biopsy of peri-pancreatic/doudenal lymph node. Path is pending.   *  C diff +.  Interestingly not having diarrhea, frequent stools.  Atypical presentation.   * Anemia. Normal MCV. Suspect due to ABL. Transfused PRBC x 1 on 11/27.   * IDDM. Poorly controlled.   * Leukocytosis, fever, SIRS of undetermined cause. Remains on Primaxin. Fevers resolved and WBCs, though elevated, improved. Negative blood and urine clxs. Small left pleural effusion on xray.  Suspect C diff is source.    PLAN   *  Still waiting on the pathology from 12/3 core biopsy of mass.    *  Dr Cruzita Lederer is starting po Vanc, stopping Primaxin. Suggest 10  day course.     [name]  05/18/2014, 11:03 AM Pager: [phone]     Attending physician's note   I have taken an interval history, reviewed the chart and examined the patient. I agree with the Advanced Practitioner's note, impression and recommendations. Agree with treating C diff. No recurrent bleeding. Awaiting CT biopsies and if biopsies are not going to return today or this weekend consider discharge and outpatient with Oncology as per Dr. Virgie Dad note from 12/1.  Pricilla Riffle. Fuller Plan, MD [facility]

## 2014-05-18 NOTE — Progress Notes (Signed)
Progress Note  [name] TIR:[id number] DOB: [birth year] DOA: 05/09/2014 PCP: [name], MD  Admit HPI / Brief Narrative: 73 year old male with known history of obstructive sleep apnea, CAD/remote PCI, diastolic CHF, remote SVT status post ablation, and recent plans for carpal tunnel surgery canceled due to poorly controlled blood sugar who presented to the ED due to palpitations, chest pain, and dizziness.  The patient was found to be orthostatic.  His cardiac enzymes were negative.  During his first night in the hospital (05/10/16) the patient spiked a fever over 103.  He denied cough, had no dysuria, chest x-ray was negative, urinalysis was negative, and leukocytosis on admission of 26K had improved to 17K.  He was started empirically on IV vancomycin and cefepime pending septic workup.  CT chest abdomen and pelvis was significant for a mass in the region of the pancreatic head.  He was seen by GI.  Antibiotics were stopped on 1127/15 as no focus of infection could be identified.  During his second night in the hospital (05/11/14) the patient developed hematochezia (known to have a history of diverticulosis) w/ a drop off his hemoglobin from 10.9 on admission to 7.9.  He was transfused packed red blood cells.  Additionally, the patient spiked a fever again, w/ worsening leukocytosis, so empiric IV antibiotic coverage with primaxin was resumed.  An EGD was accomplished 11/28, revealing a significant tumor impinging upon the duodenum.  Bxs were obtained, but bleeding limited the extent of the tissue sampling.  Path results are inconclusive.   On 12/2 the patient spiked a fever once again and his wbc began to climb.  Blood cultures were drawn.  U/A and CXR were negative for infection.  He underwent CT guided biopsy of an enlarged peripancreatic lymph node on 12/3.  We have requested a rush on the pathology results.  Subjective: Slept well last night.  Reports waking up once soaked in sweat.   Otherwise no complaints - no bowel movements.  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   Bx results were inconclusive.  IR CT Guided Bx completed 12/3.  Results pending.  CA 19-9 was actually low.  Oncology was consulted 12/1.  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  Oncology recommends outpatient PET scan followed by hepatobiliary surgery consultation. The family expresses that they would like to be evaluated at [facility].   GI bleed - Acute painless hematochezia 2 episodes of BRBPR -  Aspirin held - Lovenox stopped.  Patient had several days of black stool with a significant drop in hemoglobin 10.9 > 7.8.  He was transfused but continued to have melana (1 black stool on 11/30, and a 2nd on 12/1). Has been transfused a total of 3 units.  Tumor itself was ulcerated, and is likely the source of his blood loss. 12/3-4 hgb stable.  No further melena.  C diff positive. Patient was having fever and elevated WBC count, but interestingly no frank diarrhea.  C-diff PCR was positive. PO Vancomycin started 12/4.   SIRS - FUO Tmax 103 05/10/14 w/ leukocytosis of 17,000 - fever curve was trending down, but spiked a fever of 102.7 on 12/2 after being on primaxin.  C-diff PCR was positive.  Primaxin discontinued.  Oral vancomycin started 12/4. UA and cultures are negative - CXR no acute finding -  flu PCR negative  Blood cultures are negative and final.  Repeat cultures drawn show NGTD. Repeat CXR 11/29 w/o infiltrate  Acute blood loss anemia  Due to GIB.  Transfused 3units PRBC.  Hb stable at approximately 9.1 for the past 48 hours.  Diabetes mellitus Severely uncontrolled - required IV glucose stabilizer at admission - gtt stopped 11/28 - CBG poorly controlled.  Adjust tx further today and follow - A1c  9.8 On large doses of Lantus and novolog yet still has poor control.  Will increase Lantus to 55 bid, novolog 17 with meals and SSI resistant on 12/4.  Mixed hyperlipidemia Continue statin for now.  LFTs wnl  Chronic diastolic CHF  Clinically compensated.  LVEF 60 - 65% on 11/26.  CORONARY ATHEROSCLEROSIS NATIVE CORONARY ARTERY Currently denies chest pain - negative troponin 3  Hypomagnesemia Replaced.  Acute renal failure Resolved w/ volume resuscitation   OSA  CPAP compliant  Obesity - Body mass index is 35.33 kg/(m^2).  Hypoalbuminemia. Appreciate nutrition consult.  Added glucerna.  Upper extremity swelling. Right > left.  Patient reports multiple IVs.  Left upper ext with superficial clot around the PICC on doppler u/s.    Code Status: FULL Family Communication: spoke w/ wife and daughters at bedside. Disposition Plan: to home when appropriate   Consultants: GI Cardiology  Oncology Interventional Radiology  Procedures: EGD - 11/28 - results noted above  CT guilded biopsy 12/3.  Antibiotics: Cefepime 11/26 Vanc 11/26 > 11/28 Imipenem 11/27 >12/4 Oral Vancomycin 12/4>>12/18   DVT prophylaxis: SCDs as he has had bleeding.  Objective: Blood pressure 130/57, pulse 94, temperature 98 F (36.7 C), temperature source Oral, resp. rate 18, height 6\' 5"  (1.956 m), weight 135.172 kg (298 lb), SpO2 95 %.  Intake/Output Summary (Last 24 hours) at 05/18/14 1340 Last data filed at 05/18/14 0850  Gross per 24 hour  Intake     20 ml  Output    500 ml  Net   -480 ml   Exam: General: Wd, Tall 6'6" male, NAD, A&O with clear speech.  Lying in bed.  Tearful after we talk about his pathology Lungs: Clear to auscultation bilaterally without wheezes or crackles Cardiovascular: regular rate with reg rhythm without murmur gallop or rub Abdomen: obese, Nontender, slightly distended, bowel sounds decreased, no appreciable mass Extremities: 1+ edema bilaterally in upper  extremities.  1+ edema in lower extremities.  Able to ambulate.  Data Reviewed: Basic Metabolic Panel:  Recent Labs Lab 05/12/14 0223 05/13/14 0230 05/14/14 0409 05/15/14 0220 05/17/14 0600  NA 136* 134* 135* 136* 138  K 4.3 4.6 4.5 3.9 4.4  CL 104 103 103 103 104  CO2 16* 17* 20 21 20   GLUCOSE 157* 320* 305* 159* 284*  BUN 16 17 21 21 19   CREATININE 0.94 1.14 1.12 1.01 1.06  CALCIUM 8.8 8.6 8.3* 8.5 8.1*  MG 1.3*  --  1.6  --   --   PHOS  --   --   --  3.5  --     Liver Function Tests:  Recent Labs Lab 05/13/14 0230 05/14/14 0409 05/15/14 0220 05/17/14 0600  AST 13 17  --  30  ALT 12 11  --  33  ALKPHOS 57 63  --  66  BILITOT 0.6 0.4  --  0.6  PROT 5.4* 5.0*  --  5.1*  ALBUMIN 1.7* 1.6* 1.5* 1.6*    Recent Labs Lab 05/13/14 0230  LIPASE 18   Coags:  Recent Labs  Lab 05/14/14 0409  INR 1.24    Recent Labs Lab 05/14/14 0409  APTT 26    CBC:  Recent Labs Lab 05/15/14 0220 05/15/14 1640 05/17/14 0600 05/17/14 0800 05/18/14 0605  WBC 16.2* 15.6* 23.8* 25.0* 18.7*  NEUTROABS  --   --   --  20.7*  --   HGB 8.4* 9.2* 9.3* 9.4* 9.1*  HCT 24.9* 26.9* 28.3* 27.4* 27.6*  MCV 91.2 92.4 91.9 92.3 91.7  PLT 232 237 247 243 208    BNP (last 3 results)  Recent Labs  01/12/14 1244  PROBNP 67.7    CBG:  Recent Labs Lab 05/17/14 1203 05/17/14 1654 05/17/14 2127 05/18/14 0749 05/18/14 1213  GLUCAP 291* 249* 316* 286* 272*    Recent Results (from the past 240 hour(s))  Blood Culture (routine x 2)     Status: None   Collection Time: 05/09/14  9:10 AM  Result Value Ref Range Status   Specimen Description BLOOD LEFT FOREARM  Final   Special Requests BOTTLES DRAWN AEROBIC AND ANAEROBIC 5CC  Final   Culture  Setup Time   Final    05/09/2014 18:03 Performed at [facility]    Culture   Final    NO GROWTH 5 DAYS Performed at [facility]    Report Status 05/15/2014 FINAL  Final  Urine culture     Status: None    Collection Time: 05/09/14  9:55 AM  Result Value Ref Range Status   Specimen Description URINE, CLEAN CATCH  Final   Special Requests NONE  Final   Culture  Setup Time   Final    05/09/2014 18:23 Performed at [place] Performed at [facility]   Final   Culture NO GROWTH Performed at [facility]   Final   Report Status 05/10/2014 FINAL  Final  Blood culture (routine x 2)     Status: None   Collection Time: 05/09/14 11:40 AM  Result Value Ref Range Status   Specimen Description BLOOD LEFT HAND  Final   Special Requests BOTTLES DRAWN AEROBIC ONLY 3CCS  Final   Culture  Setup Time   Final    05/09/2014 18:03 Performed at [facility]    Culture   Final    NO GROWTH 5 DAYS Performed at [facility]    Report Status 05/15/2014 FINAL  Final  MRSA PCR Screening     Status: None   Collection Time: 05/11/14  6:48 AM  Result Value Ref Range Status   MRSA by PCR NEGATIVE NEGATIVE Final    Comment:        The GeneXpert MRSA Assay (FDA approved for NASAL specimens only), is one component of a comprehensive MRSA colonization surveillance program. It is not intended to diagnose MRSA infection nor to guide or monitor treatment for MRSA infections.   Culture, blood (routine x 2)     Status: None (Preliminary result)   Collection Time: 05/16/14  3:25 PM  Result Value Ref Range Status   Specimen Description BLOOD RIGHT HAND  Final   Special Requests BOTTLES DRAWN AEROBIC AND ANAEROBIC 10CCS  Final   Culture  Setup Time   Final    05/16/2014 21:17 Performed at [facility]    Culture   Final           BLOOD CULTURE RECEIVED NO GROWTH TO DATE CULTURE WILL BE HELD FOR 5 DAYS BEFORE ISSUING A  FINAL NEGATIVE REPORT Performed at [facility]    Report Status PENDING  Incomplete  Culture, blood (routine x 2)     Status: None (Preliminary result)   Collection Time: 05/16/14  3:40 PM  Result Value  Ref Range Status   Specimen Description BLOOD RIGHT FOREARM  Final   Special Requests BOTTLES DRAWN AEROBIC AND ANAEROBIC 10CC  Final   Culture  Setup Time   Final    05/16/2014 21:17 Performed at [facility]    Culture   Final           BLOOD CULTURE RECEIVED NO GROWTH TO DATE CULTURE WILL BE HELD FOR 5 DAYS BEFORE ISSUING A FINAL NEGATIVE REPORT Performed at [facility]    Report Status PENDING  Incomplete  Clostridium Difficile by PCR     Status: Abnormal   Collection Time: 05/17/14  5:41 PM  Result Value Ref Range Status   C difficile by pcr POSITIVE (A) NEGATIVE Final    Comment: CRITICAL RESULT CALLED TO, READ BACK BY AND VERIFIED WITH: S. HOESLER 11:00 05/18/14 ([email or web address])     Anti-infectives    Start     Dose/Rate Route Frequency Ordered Stop   05/18/14 1215  vancomycin (VANCOCIN) 50 mg/mL oral solution 125 mg     125 mg Oral 4 times daily 05/18/14 1107 06/01/14 1359   05/16/14 1600  imipenem-cilastatin (PRIMAXIN) 500 mg in sodium chloride 0.9 % 100 mL IVPB  Status:  Discontinued     500 mg200 mL/hr over 30 Minutes Intravenous Every 6 hours 05/16/14 1516 05/18/14 1108   05/14/14 0000  imipenem-cilastatin (PRIMAXIN) 500 mg in sodium chloride 0.9 % 100 mL IVPB  Status:  Discontinued     500 mg200 mL/hr over 30 Minutes Intravenous 4 times per day 05/13/14 2142 05/16/14 1144   05/12/14 1800  imipenem-cilastatin (PRIMAXIN) 500 mg in sodium chloride 0.9 % 100 mL IVPB  Status:  Discontinued     500 mg200 mL/hr over 30 Minutes Intravenous 4 times per day 05/12/14 1307 05/13/14 2138   05/11/14 2000  vancomycin (VANCOCIN) 1,250 mg in sodium chloride 0.9 % 250 mL IVPB  Status:  Discontinued     1,250 mg166.7 mL/hr over 90 Minutes Intravenous Every 12 hours 05/11/14 1920 05/12/14 1055   05/11/14 2000  imipenem-cilastatin (PRIMAXIN) 500 mg in sodium chloride 0.9 % 100 mL IVPB  Status:  Discontinued     500 mg200 mL/hr over 30 Minutes Intravenous 3 times per day 05/11/14  1920 05/12/14 1307   05/10/14 2100  vancomycin (VANCOCIN) 1,250 mg in sodium chloride 0.9 % 250 mL IVPB  Status:  Discontinued     1,250 mg166.7 mL/hr over 90 Minutes Intravenous Every 12 hours 05/10/14 0753 05/11/14 0816   05/10/14 0830  vancomycin (VANCOCIN) 2,500 mg in sodium chloride 0.9 % 500 mL IVPB     2,500 mg250 mL/hr over 120 Minutes Intravenous NOW 05/10/14 0752 05/10/14 1128   05/10/14 0830  ceFEPIme (MAXIPIME) 1 g in dextrose 5 % 50 mL IVPB  Status:  Discontinued     1 g100 mL/hr over 30 Minutes Intravenous 3 times per day 05/10/14 0752 05/11/14 0816      Scheduled Meds:  Scheduled Meds: . antiseptic oral rinse  7 mL Mouth Rinse BID  . atorvastatin  20 mg Oral QHS  . diphenhydrAMINE  50 mg Oral QHS  . doxazosin  2 mg Oral QAC breakfast  . feeding supplement (GLUCERNA SHAKE)  237 mL Oral TID  BM  . gabapentin  200 mg Oral QHS  . insulin aspart  0-20 Units Subcutaneous TID WC  . insulin aspart  0-5 Units Subcutaneous QHS  . insulin aspart  17 Units Subcutaneous TID WC  . insulin glargine  55 Units Subcutaneous BID  . lisinopril  10 mg Oral Daily  . metoprolol  50 mg Oral BID  . pantoprazole  40 mg Oral BID  . sodium chloride  3 mL Intravenous Q12H  . vancomycin  125 mg Oral QID    Time spent on care of this patient: 35 mins   Karen Kitchens  [facility] Office  [phone] Pager - Text Page per Shea Evans as per below:  On-Call/Text Page:      [email or web address]      password [email or web address]  05/18/2014, 1:40 PM   LOS: 9 days    Patient seen and examined, chart and data base reviewed.  I agree with the above assessment and plan and have edited the above note. 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 He had a CT scan chest, abdomen and pelvis without other masses other than the duodenal one. Will need a PET scan as an outpatient to determine primary / other mets.  For full details please see Mrs. Imogene Burn PA note.  Marzetta Board,  MD [facility] [phone]

## 2014-05-18 NOTE — Progress Notes (Signed)
NCM informed patient  And wife the po vanc will be $61.52 for first 2 weeks and then for the next weeks it will be cheaper , they state they can afford and wife will pick up today at 5 pm at [facility] [place].  Pharmacist states will be ready at 5 pm.

## 2014-05-18 NOTE — Progress Notes (Signed)
Results for SAHEED, CARRINGTON (MRN [id number]) as of 05/18/2014 12:01  Ref. Range 05/17/2014 07:58 05/17/2014 12:03 05/17/2014 16:54 05/17/2014 21:27 05/18/2014 07:49  Glucose-Capillary Latest Range: 70-99 mg/dL 271 (H) 291 (H) 249 (H) 316 (H) 286 (H)  CBGs remain elevated. Recommend increasing Lantus to 55 units BID if CBGs continue to be elevated. Noted that Glucerna supplement is being given between meals which may be causing meal time CBGs to be elevated. Could give Glucerna at meal times since patient is getting Novolog 15 units TID meal coverage. Will continue to follow while in hospital. Harvel Ricks RN BSN CDE

## 2014-05-18 NOTE — Progress Notes (Signed)
Medicare Important Message given?  YES (If response is "NO", the following Medicare IM given date fields will be blank) Date Medicare IM given:   Medicare IM given by:  Skyley Grandmaison 

## 2014-05-19 DIAGNOSIS — I10 Essential (primary) hypertension: Secondary | ICD-10-CM

## 2014-05-19 DIAGNOSIS — I5032 Chronic diastolic (congestive) heart failure: Secondary | ICD-10-CM

## 2014-05-19 LAB — BASIC METABOLIC PANEL
Anion gap: 12 (ref 5–15)
BUN: 21 mg/dL (ref 6–23)
CALCIUM: 8.2 mg/dL — AB (ref 8.4–10.5)
CO2: 20 meq/L (ref 19–32)
Chloride: 105 mEq/L (ref 96–112)
Creatinine, Ser: 1.11 mg/dL (ref 0.50–1.35)
GFR calc Af Amer: 75 mL/min — ABNORMAL LOW (ref >90)
GFR calc non Af Amer: 64 mL/min — ABNORMAL LOW (ref >90)
GLUCOSE: 193 mg/dL — AB (ref 70–99)
Potassium: 4.1 mEq/L (ref 3.7–5.3)
Sodium: 137 mEq/L (ref 137–147)

## 2014-05-19 LAB — GLUCOSE, CAPILLARY
GLUCOSE-CAPILLARY: 322 mg/dL — AB (ref 70–99)
GLUCOSE-CAPILLARY: 282 mg/dL — AB (ref 70–99)

## 2014-05-19 LAB — CLOSTRIDIUM DIFFICILE BY PCR: Toxigenic C. Difficile by PCR: POSITIVE — AB

## 2014-05-19 LAB — CBC
HCT: 27.8 % — ABNORMAL LOW (ref 39.0–52.0)
HEMOGLOBIN: 9.2 g/dL — AB (ref 13.0–17.0)
MCH: 31 pg (ref 26.0–34.0)
MCHC: 33.1 g/dL (ref 30.0–36.0)
MCV: 93.6 fL (ref 78.0–100.0)
Platelets: 196 10*3/uL (ref 150–400)
RBC: 2.97 MIL/uL — ABNORMAL LOW (ref 4.22–5.81)
RDW: 14.3 % (ref 11.5–15.5)
WBC: 21.9 10*3/uL — ABNORMAL HIGH (ref 4.0–10.5)

## 2014-05-19 NOTE — Progress Notes (Signed)
NCM spoke with with offered choice for hhpt, she chose [facility], referral given to [facility] on 12/4, Miranda notified that patient is for dc 12/5. Soc will begin 24-48 hrs post dc.

## 2014-05-19 NOTE — Discharge Summary (Signed)
Physician Discharge Summary  [name] YWV:[id number] DOB: 1941-05-05 DOA: 05/09/2014  PCP: [name], MD  Admit date: 05/09/2014 Discharge date: 05/19/2014  Time spent: 45 minutes  Recommendations for Outpatient Follow-up:  1. Follow up with Dr. Brigitte Pulse next week for repeat blood work  2. Will arrange PET scan as an outpatient  3. Follow up with Oncology/Surgery as an outpatient once PET scan results are available    Recommendations for primary care physician for things to follow:  Repeat CBC   Discharge Diagnoses:  Principal Problem:   Palpitations Active Problems:   Mixed hyperlipidemia   CORONARY ATHEROSCLEROSIS NATIVE CORONARY ARTERY   Paroxysmal SVT (supraventricular tachycardia)   AKI (acute kidney injury)   HTN (hypertension)   Chronic diastolic CHF (congestive heart failure)   Leukocytosis   Orthostatic hypotension   Chest pain   Hypoglycemia   SIRS (systemic inflammatory response syndrome)   GI bleed   Bleeding gastrointestinal   Pancreatic mass   Abnormal CT of the abdomen   Acute blood loss anemia   Abdominal mass   Generalized weakness   Carcinoma of unknown primary   Enteritis due to Clostridium difficile  Discharge Condition: stable  Diet recommendation: diabetic   Filed Weights   05/09/14 0724 05/09/14 1236 05/10/14 0559  Weight: 131.543 kg (290 lb) 134.4 kg (296 lb 4.8 oz) 135.172 kg (298 lb)    History of present illness:  [name] is a 73 y.o. male with a Past Medical History of coronary artery disease, chronic diastolic heart failure, diabetes, hypertension, dyslipidemia who presented with palpitations/chest pain/shortness of breath. Approximately 2 weeks back, patient was supposed to have carpal tunnel surgery and this was canceled because of hyperglycemia (sugar more than 400). During the same time, patient developed lightheadedness particularly on standing. He saw his primary care practitioner who took him off lisinopril, and made  some adjustment to his insulin/diabetic regimen. He also had blood work then, that showed leukocytosis. Following these adjustments, patient was doing relatively well, until he saw his primary care practitioner yesterday and was asked to resume his lisinopril. This morning, upon waking up and standing, patient experienced lightheadedness, unsteady gait along with severe palpitations. Patient stumbled his way to the kitchen and check his CBG which was 104. He continued to have palpitations while sitting, and then started having retrosternal chest pain. Patient claims he thought he was having a heart attack. He then slowly stood up and made his way back to his room, his wife then brought him to the emergency room for further evaluation and treatment. In the ED, patient supine blood pressure and heart rate was 106/53 and 66 respectively, on standing blood pressure was 88/43 and heart rate of 82. During my evaluation, patient was completely asymptomatic, I did stand him up without any major symptoms.  Patient denies any recent nausea, vomiting or diarrhea. Upon from the above changes in his medications-no further changes were made recently. He was also found to have mild elevation of his creatinine level, and leukocytosis of 26,000. He denies any fever, chills or sweats. He denies any night sweats. There is no history of cough or shortness of breath. No history of abdominal pain. No history of dysuria.  Hospital Course:   73 year old male with known history of obstructive sleep apnea, CAD/remote PCI, diastolic CHF, remote SVT status post ablation, and recent plans for carpal tunnel surgery canceled due to poorly controlled blood sugar who presented to the ED due to palpitations, chest pain, and  dizziness.The patient was found to be orthostatic on admission and his cardiac enzymes were negative. Cardiology was consulted and followed patient for about 2 days. During his first night in the hospital (05/10/16) the  patient spiked a fever over 103. He denied cough, had no dysuria, chest x-ray was negative, urinalysis was negative, and leukocytosis on admission of 26K had improved to 17K. He was started empirically on IV vancomycin and cefepime pending septic workup.On hospital day 2, 11/26 patient with significant gi bleeding with hematochezia, Hb dropping from 10.9 to 7.9, and a gastroenterology consult was obtained. It now became likely that his original presentation was due to incipient GI bleeding. He then underwent a CT chest abdomen and pelvis was significant for a mass in the region of the pancreatic head.Antibiotics were stopped on 1127/15 as no focus of infection could be identified however later restarted as he was intermittently febrile. An EGD was accomplished 11/28, revealing a significant tumor impinging upon the duodenum. Biopsies were obtained, but bleeding limited the extent of the tissue sampling. The pathology results were inconclusive and subsequently IR was consulted, and patient underwent a second biopsy, CT guided, on 05/17/2014. Pathology showed high grade carcinoma, poorly differentiated of uncertain etiology. Oncology was consulted and was involved in patient's care. Given unknown primary patient needs an outpatient PET scan and probable hepatobiliary surgery follow up. Further planning will be done based on PET findings and will need further evaluation by surgery / oncology multidisciplinary team. I will order and schedule the PET scan to provide continuity, then his PCP and oncology will take over once results are back.   Patient has been having intermittent fevers and leukocytosis in the 20K, stable throughout his hospital stay.  He was on broad spectrum antibiotics intermittently however without a definitive source. On 12/3 patient was checked for C diff given loose stools and tested positive for C diff. Given significant leukocytosis he was started on po Vancomycin with significant clinical  improvement. His fevers were likely multifactorial in the setting of malignancy as well as this infection. On the day of discharge he was feeling close to baseline, without diarrhea, able to tolerate a regular diet and without further complaints. He was discharged home with close outpatient follow up by oncology and a PET scan will next be scheduled.   Pancreatic v/s duodenal mass  GI bleed - Acute painless hematochezia - Aspirin will be held on discharge C diff positive. - Patient was having fever and elevated WBC count, but interestingly no frank diarrhea.He is to finish a 14 day course.  Acute blood loss anemia - Due to GIB. Hb has remained stable since 12/1 and has not required further blood transfusions.  Diabetes mellitus - follow up with PCP for further uptitration Mixed hyperlipidemia - Continue statin for now. LFTs wnl Chronic diastolic CHF - Clinically compensated. LVEF 60 - 65% on 11/26.  Procedures:  2D echo Left ventricle: The cavity size was normal. There was mild concentric hypertrophy. Systolic function was normal. The estimated ejection fraction was in the range of 60% to 65%. Wall motion was normal; there were no regional wall motionabnormalities.  EGD 05/12/2014 - Duodenal mass with significant luminal compression.  IR guided biopsy 12/3  Consultations:  Cardiology  Gastroenterology  Oncology   Discharge Exam: Filed Vitals:   05/18/14 2134 05/18/14 2152 05/19/14 0026 05/19/14 0527  BP:  98/48  120/49  Pulse: 86 99  95  Temp:  101.3 F (38.5 C) 98.6 F (37 C) 98.7 F (  37.1 C)  TempSrc:  Oral Axillary Oral  Resp: 18   15  Height:      Weight:      SpO2: 100% 95%  96%    General: NAD Cardiovascular: RRR Respiratory: CTA biL  Discharge Instructions     Medication List    STOP taking these medications        aspirin EC 81 MG tablet      TAKE these medications        atorvastatin 20 MG tablet  Commonly known as:  LIPITOR  Take 20 mg by  mouth at bedtime.     doxazosin 2 MG tablet  Commonly known as:  CARDURA  Take 2 mg by mouth daily before breakfast.     furosemide 20 MG tablet  Commonly known as:  LASIX  Take 60 mg by mouth daily.     insulin NPH-regular Human (70-30) 100 UNIT/ML injection  Commonly known as:  NOVOLIN 70/30  Inject 45-65 Units into the skin 2 (two) times daily with a meal. 80 units in am with breakfast and 90 units in pm with supper     lisinopril 20 MG tablet  Commonly known as:  PRINIVIL,ZESTRIL  Take 20 mg by mouth daily.     metFORMIN 500 MG 24 hr tablet  Commonly known as:  GLUCOPHAGE-XR  Take 500 mg by mouth daily with supper.     metoprolol 100 MG tablet  Commonly known as:  LOPRESSOR  Take 50 mg by mouth 2 (two) times daily.     omeprazole 20 MG capsule  Commonly known as:  PRILOSEC  Take 40 mg by mouth daily.     potassium chloride SA 20 MEQ tablet  Commonly known as:  K-DUR,KLOR-CON  Take 1 tablet (20 mEq total) by mouth 2 (two) times daily.     PRESERVISION AREDS PO  Take 1 tablet by mouth 2 (two) times daily.     vancomycin 50 mg/mL oral solution  Commonly known as:  VANCOCIN  Take 2.5 mLs (125 mg total) by mouth 4 (four) times daily.           Follow-up Information    Follow up with SHAW,KIMBERLEE, MD. Schedule an appointment as soon as possible for a visit in 1 week.   Specialty:  Family Medicine   Why:  repeat bloodwork   Contact information:   [address] [place] [address] [phone]       The results of significant diagnostics from this hospitalization (including imaging, microbiology, ancillary and laboratory) are listed below for reference.    Significant Diagnostic Studies: Dg Chest 2 View  05/17/2014   CLINICAL DATA:  Shortness of breath and fever. History of hypertension and diabetes.  EXAM: CHEST  2 VIEW  COMPARISON:  05/13/2014; 05/09/2014; chest CT - 05/10/2014  FINDINGS: Grossly unchanged enlarged cardiac silhouette and  mediastinal contours with minimal atherosclerotic plaque within the thoracic aorta. The lungs remain hyperexpanded. There is blunting of the bilateral costophrenic angles suggestive of trace bilateral pleural effusions. Unchanged bibasilar heterogeneous opacities, left greater than right, likely atelectasis. No new focal airspace opacities. No evidence of edema. No pneumothorax. Unchanged bones.  IMPRESSION: 1. Cardiomegaly and suspected trace bilateral effusions without evidence of edema. 2. Unchanged bibasilar opacities, left greater than right, favored to represent atelectasis. No discrete focal airspace opacities to suggest pneumonia.   Electronically Signed   By: Sandi Mariscal M.D.   On: 05/17/2014 09:41   Dg Chest 2  View  05/01/2014   CLINICAL DATA:  Leukocytosis for 5 days, lightheaded, diabetes mellitus, hypertension, GERD, coronary artery disease, former smoker  EXAM: CHEST  2 VIEW  COMPARISON:  01/12/2014 ; correlation CT chest 10/03/2012  FINDINGS: Enlargement of cardiac silhouette.  Prominent density at the RIGHT cardiophrenic angle likely prominent epicardial fat pad, unchanged since previous exam, with prominent fat pad noted on prior CT.  Mediastinal contours and pulmonary vascularity normal.  Bronchitic changes without infiltrate, pleural effusion or pneumothorax.  No acute osseous findings.  IMPRESSION: Enlargement of cardiac silhouette.  Mild chronic bronchitic changes.  No acute abnormalities.   Electronically Signed   By: Lavonia Dana M.D.   On: 05/01/2014 16:55   Ct Angio Chest Pe W/cm &/or Wo Cm  05/10/2014   CLINICAL DATA:  Leukocytosis, fever of unknown origin, short of breath, chest pain and dizziness.  EXAM: CT ANGIOGRAPHY CHEST  CT ABDOMEN AND PELVIS WITH CONTRAST  TECHNIQUE: Multidetector CT imaging of the chest was performed using the standard protocol during bolus administration of intravenous contrast. Multiplanar CT image reconstructions and MIPs were obtained to evaluate the  vascular anatomy. Multidetector CT imaging of the abdomen and pelvis was performed using the standard protocol during bolus administration of intravenous contrast.  CONTRAST:  78mL OMNIPAQUE IOHEXOL 350 MG/ML SOLN  COMPARISON:  80 mL Omnipaque 350  FINDINGS: CTA CHEST FINDINGS  No filling defects within the pulmonary arteries to suggest acute pulmonary embolism. No acute findings aorta great vessels. No pericardial fluid. Esophagus is normal.  Review of the lung parenchyma demonstrates no suspicious pulmonary nodules. No airspace disease. No pleural fluid.  CT ABDOMEN and PELVIS FINDINGS  Abdominal portion was scanned at a delayed time . Therefore it is essentially a noncontrast exam.  Hepatobiliary: No focal hepatic lesion identified on this noncontrast exam. Post cholecystectomy.  Pancreas: There is an mass at the level of the pancreatic head measuring 5.2 x 3.1 x 6.1 cm. This mass is along the medial border of the second portion the duodenum and compressive the lumen of the duodenum duodenum. There is enlarged lymph node adjacent to the mass measuring 19 mm. No pancreatic duct dilatation. There is no biliary duct dilatation.  Spleen: Normal spleen appear  Adrenals/Urinary Tract: Adrenal glands are normal. Delayed imaging of the kidneys demonstrate no obstruction. Ureters and bladder normal.  Stomach/Bowel: Stomach, small bowel and appendix are normal. The colon rectosigmoid colon are normal. There are diverticula of the sigmoid colon acute inflammation.  Mass in the C-loop of the duodenum described in the pancreatic section.  Vascular/Lymphatic: There is scattered calcification of the abdominal aorta. No retroperitoneal periportal lymphadenopathy. No mesenteric or peritoneal metastasis.  Reproductive: Prostate gland and bladder normal. No pelvic lymphadenopathy.  Other: No free fluid in the abdomen pelvis.  No free air  Musculoskeletal: No aggressive osseous lesion.  Review of the MIP images confirms the above  findings.  IMPRESSION: Chest Impression:  No evidence of acute pulmonary embolism.  Abdomen / Pelvis Impression:  1. Mass adjacent to pancreatic head and along the C-loop of the duodenum measuring 6 cm. No pancreatic duct dilatation or biliary duct dilatation. Differential includes pancreatic neoplasm, gastrointestinal stromal tumor, or lymphoma. Recommend endoscopic ultrasound with biopsy of this lesion.  2. Local lymphadenopathy with enlarged peripancreatic lymph node.  3. Liver parenchyma is poorly evaluated on this essentially noncontrast exam.  Findings conveyed toDAWOOD ELGERGAWY on 05/10/2014  at19:17.   Electronically Signed   By: Suzy Bouchard M.D.   On: 05/10/2014  19:18   Ct Abdomen Pelvis W Contrast  05/10/2014   CLINICAL DATA:  Leukocytosis, fever of unknown origin, short of breath, chest pain and dizziness.  EXAM: CT ANGIOGRAPHY CHEST  CT ABDOMEN AND PELVIS WITH CONTRAST  TECHNIQUE: Multidetector CT imaging of the chest was performed using the standard protocol during bolus administration of intravenous contrast. Multiplanar CT image reconstructions and MIPs were obtained to evaluate the vascular anatomy. Multidetector CT imaging of the abdomen and pelvis was performed using the standard protocol during bolus administration of intravenous contrast.  CONTRAST:  57mL OMNIPAQUE IOHEXOL 350 MG/ML SOLN  COMPARISON:  80 mL Omnipaque 350  FINDINGS: CTA CHEST FINDINGS  No filling defects within the pulmonary arteries to suggest acute pulmonary embolism. No acute findings aorta great vessels. No pericardial fluid. Esophagus is normal.  Review of the lung parenchyma demonstrates no suspicious pulmonary nodules. No airspace disease. No pleural fluid.  CT ABDOMEN and PELVIS FINDINGS  Abdominal portion was scanned at a delayed time . Therefore it is essentially a noncontrast exam.  Hepatobiliary: No focal hepatic lesion identified on this noncontrast exam. Post cholecystectomy.  Pancreas: There is an mass at  the level of the pancreatic head measuring 5.2 x 3.1 x 6.1 cm. This mass is along the medial border of the second portion the duodenum and compressive the lumen of the duodenum duodenum. There is enlarged lymph node adjacent to the mass measuring 19 mm. No pancreatic duct dilatation. There is no biliary duct dilatation.  Spleen: Normal spleen appear  Adrenals/Urinary Tract: Adrenal glands are normal. Delayed imaging of the kidneys demonstrate no obstruction. Ureters and bladder normal.  Stomach/Bowel: Stomach, small bowel and appendix are normal. The colon rectosigmoid colon are normal. There are diverticula of the sigmoid colon acute inflammation.  Mass in the C-loop of the duodenum described in the pancreatic section.  Vascular/Lymphatic: There is scattered calcification of the abdominal aorta. No retroperitoneal periportal lymphadenopathy. No mesenteric or peritoneal metastasis.  Reproductive: Prostate gland and bladder normal. No pelvic lymphadenopathy.  Other: No free fluid in the abdomen pelvis.  No free air  Musculoskeletal: No aggressive osseous lesion.  Review of the MIP images confirms the above findings.  IMPRESSION: Chest Impression:  No evidence of acute pulmonary embolism.  Abdomen / Pelvis Impression:  1. Mass adjacent to pancreatic head and along the C-loop of the duodenum measuring 6 cm. No pancreatic duct dilatation or biliary duct dilatation. Differential includes pancreatic neoplasm, gastrointestinal stromal tumor, or lymphoma. Recommend endoscopic ultrasound with biopsy of this lesion.  2. Local lymphadenopathy with enlarged peripancreatic lymph node.  3. Liver parenchyma is poorly evaluated on this essentially noncontrast exam.  Findings conveyed toDAWOOD ELGERGAWY on 05/10/2014  at19:17.   Electronically Signed   By: Suzy Bouchard M.D.   On: 05/10/2014 19:18   Ct Biopsy  05/17/2014   INDICATION: History of duodenal mass. Patient underwent nondiagnostic endoscopic biopsy and presents now  for CT-guided biopsy of a peripancreatic/duodenal lymph node for tissue diagnostic purposes  EXAM: CT GUIDED BIOPSY OF PERIPANCREATIC/DUODENAL LYMPH NODE  COMPARISON:  CT the abdomen pelvis - 05/10/2014  MEDICATIONS: Fentanyl 50 mcg IV; Versed 1.5 mg IV  ANESTHESIA/SEDATION: Sedation time  20 minutes  CONTRAST:  None  COMPLICATIONS: None immediate  PROCEDURE: Informed consent was obtained from the patient following an explanation of the procedure, risks, benefits and alternatives. A time out was performed prior to the initiation of the procedure.  The patient was positioned right lateral decubitus on the CT table and a  limited CT was performed for procedural planning demonstrating unchanged size and appearance of previously identified approximately 2.2 x 1.9 cm enlarged peripancreatic/duodenal lymph node immediately adjacent to the large intraluminal duodenal mass (image 40, series 2). The procedure was planned. The operative site was prepped and draped in the usual sterile fashion. Appropriate trajectory was confirmed with a 22 gauge spinal needle after the adjacent tissues were anesthetized with 1% Lidocaine with epinephrine.  Under intermittent CT guidance, a 17 gauge coaxial needle was advanced adjacent to the peripheral aspect of the lymph node (note, given patient body habitus, the coaxial needle was still approximately 2.3 cm too short for purchase directly within in the enlarged lymph node (representative image 2, series 6).  4 core needle samples were obtained with an 18 gauge core needle biopsy device while slightly advancing the biopsy device system. The co-axial needle was removed and hemostasis was achieved with manual compression.  A limited postprocedural CT was negative for hemorrhage or additional complication. A dressing was placed. The patient tolerated the procedure well without immediate postprocedural complication.  IMPRESSION: Technically successful CT guided core needle biopsy of dominant  peripancreatic/duodenal lymph node.   Electronically Signed   By: Sandi Mariscal M.D.   On: 05/17/2014 10:55   Dg Chest Port 1 View  05/13/2014   CLINICAL DATA:  Fever, diabetes, hypertension  EXAM: PORTABLE CHEST - 1 VIEW  COMPARISON:  05/09/2014  FINDINGS: Stable cardiomegaly.  Atheromatous aorta.  Lungs are clear. Small left pleural effusion as before. Left arm PICC line to the cavoatrial junction. Regional bones unremarkable.  IMPRESSION: 1. Stable cardiomegaly and small left pleural effusion. 2. Left PICC line to cavoatrial junction.   Electronically Signed   By: Arne Cleveland M.D.   On: 05/13/2014 15:08   Dg Chest Port 1 View  05/09/2014   CLINICAL DATA:  Chest pain  EXAM: PORTABLE CHEST - 1 VIEW  COMPARISON:  05/01/2014  FINDINGS: Cardiac shadow remains enlarged. The lungs are well aerated bilaterally without focal infiltrate or sizable effusion. Mild elevation of the right hemidiaphragm is again seen. No gross soft tissue abnormality is noted.  IMPRESSION: No acute abnormality seen.   Electronically Signed   By: Inez Catalina M.D.   On: 05/09/2014 08:23    Microbiology: Recent Results (from the past 240 hour(s))  MRSA PCR Screening     Status: None   Collection Time: 05/11/14  6:48 AM  Result Value Ref Range Status   MRSA by PCR NEGATIVE NEGATIVE Final    Comment:        The GeneXpert MRSA Assay (FDA approved for NASAL specimens only), is one component of a comprehensive MRSA colonization surveillance program. It is not intended to diagnose MRSA infection nor to guide or monitor treatment for MRSA infections.   Culture, blood (routine x 2)     Status: None (Preliminary result)   Collection Time: 05/16/14  3:25 PM  Result Value Ref Range Status   Specimen Description BLOOD RIGHT HAND  Final   Special Requests BOTTLES DRAWN AEROBIC AND ANAEROBIC 10CCS  Final   Culture  Setup Time   Final    05/16/2014 21:17 Performed at [facility]    Culture   Final           BLOOD  CULTURE RECEIVED NO GROWTH TO DATE CULTURE WILL BE HELD FOR 5 DAYS BEFORE ISSUING A FINAL NEGATIVE REPORT Performed at [facility]    Report Status PENDING  Incomplete  Culture, blood (  routine x 2)     Status: None (Preliminary result)   Collection Time: 05/16/14  3:40 PM  Result Value Ref Range Status   Specimen Description BLOOD RIGHT FOREARM  Final   Special Requests BOTTLES DRAWN AEROBIC AND ANAEROBIC 10CC  Final   Culture  Setup Time   Final    05/16/2014 21:17 Performed at [facility]    Culture   Final           BLOOD CULTURE RECEIVED NO GROWTH TO DATE CULTURE WILL BE HELD FOR 5 DAYS BEFORE ISSUING A FINAL NEGATIVE REPORT Performed at [facility]    Report Status PENDING  Incomplete  Clostridium Difficile by PCR     Status: Abnormal   Collection Time: 05/17/14  5:41 PM  Result Value Ref Range Status   C difficile by pcr POSITIVE (A) NEGATIVE Final    Comment: CRITICAL RESULT CALLED TO, READ BACK BY AND VERIFIED WITH: S. HOESLER 11:00 05/18/14 ([email or web address]) S.HOESLER RN       Labs: Basic Metabolic Panel:  Recent Labs Lab 05/13/14 0230 05/14/14 0409 05/15/14 0220 05/17/14 0600 05/19/14 0500  NA 134* 135* 136* 138 137  K 4.6 4.5 3.9 4.4 4.1  CL 103 103 103 104 105  CO2 17* 20 21 20 20   GLUCOSE 320* 305* 159* 284* 193*  BUN 17 21 21 19 21   CREATININE 1.14 1.12 1.01 1.06 1.11  CALCIUM 8.6 8.3* 8.5 8.1* 8.2*  MG  --  1.6  --   --   --   PHOS  --   --  3.5  --   --    Liver Function Tests:  Recent Labs Lab 05/13/14 0230 05/14/14 0409 05/15/14 0220 05/17/14 0600  AST 13 17  --  30  ALT 12 11  --  33  ALKPHOS 57 63  --  66  BILITOT 0.6 0.4  --  0.6  PROT 5.4* 5.0*  --  5.1*  ALBUMIN 1.7* 1.6* 1.5* 1.6*    Recent Labs Lab 05/13/14 0230  LIPASE 18   CBC:  Recent Labs Lab 05/15/14 1640 05/17/14 0600 05/17/14 0800 05/18/14 0605 05/19/14 0500  WBC 15.6* 23.8* 25.0* 18.7* 21.9*  NEUTROABS  --   --  20.7*  --   --   HGB  9.2* 9.3* 9.4* 9.1* 9.2*  HCT 26.9* 28.3* 27.4* 27.6* 27.8*  MCV 92.4 91.9 92.3 91.7 93.6  PLT 237 247 243 208 196   BNP: BNP (last 3 results)  Recent Labs  01/12/14 1244  PROBNP 67.7   CBG:  Recent Labs Lab 05/18/14 1213 05/18/14 1658 05/18/14 2149 05/19/14 0806 05/19/14 1244  GLUCAP 272* 187* 198* 322* 282*       Signed:  Shaun Runyon  [facility] 05/19/2014, 3:10 PM

## 2014-05-19 NOTE — Discharge Instructions (Signed)

## 2014-05-21 ENCOUNTER — Other Ambulatory Visit: Payer: Self-pay | Admitting: Internal Medicine

## 2014-05-21 DIAGNOSIS — C801 Malignant (primary) neoplasm, unspecified: Secondary | ICD-10-CM

## 2014-05-21 NOTE — Addendum Note (Signed)
Addended by: Caren Griffins on: 05/21/2014 10:14 AM   Modules accepted: Orders

## 2014-05-22 LAB — CULTURE, BLOOD (ROUTINE X 2)
CULTURE: NO GROWTH
Culture: NO GROWTH

## 2014-05-23 ENCOUNTER — Telehealth: Payer: Self-pay | Admitting: Internal Medicine

## 2014-05-23 NOTE — Telephone Encounter (Signed)
LEFT MESSAGE FOR PATIENT TO RETURN CALL TO SCHEDULE NP APPT.  °

## 2014-05-24 ENCOUNTER — Telehealth: Payer: Self-pay | Admitting: Oncology

## 2014-05-24 NOTE — Telephone Encounter (Signed)
S/W PATIENT WIFE AND GAVE NP APPT FOR 12/16 @ 10;30 W/DR. SHADAD.

## 2014-05-29 ENCOUNTER — Other Ambulatory Visit: Payer: Self-pay | Admitting: Internal Medicine

## 2014-05-29 ENCOUNTER — Ambulatory Visit (HOSPITAL_COMMUNITY)
Admission: RE | Admit: 2014-05-29 | Discharge: 2014-05-29 | Disposition: A | Discharge status: Home / Self Care | Payer: Medicare Other | Source: Ambulatory Visit | Attending: Internal Medicine | Admitting: Internal Medicine

## 2014-05-29 DIAGNOSIS — C801 Malignant (primary) neoplasm, unspecified: Secondary | ICD-10-CM

## 2014-05-29 DIAGNOSIS — M542 Cervicalgia: Secondary | ICD-10-CM | POA: Diagnosis not present

## 2014-05-29 DIAGNOSIS — R1900 Intra-abdominal and pelvic swelling, mass and lump, unspecified site: Secondary | ICD-10-CM | POA: Insufficient documentation

## 2014-05-29 DIAGNOSIS — A419 Sepsis, unspecified organism: Secondary | ICD-10-CM | POA: Diagnosis not present

## 2014-05-29 LAB — GLUCOSE, CAPILLARY: GLUCOSE-CAPILLARY: 181 mg/dL — AB (ref 70–99)

## 2014-05-29 MED ORDER — FLUDEOXYGLUCOSE F - 18 (FDG) INJECTION
15.9000 | Freq: Once | INTRAVENOUS | Status: AC | PRN
  Administered 2014-05-29: 15.9 via INTRAVENOUS

## 2014-05-30 ENCOUNTER — Encounter: Payer: Self-pay | Admitting: Oncology

## 2014-05-30 ENCOUNTER — Encounter (INDEPENDENT_AMBULATORY_CARE_PROVIDER_SITE_OTHER): Payer: Self-pay

## 2014-05-30 ENCOUNTER — Ambulatory Visit (HOSPITAL_BASED_OUTPATIENT_CLINIC_OR_DEPARTMENT_OTHER): Payer: Medicare Other | Admitting: Oncology

## 2014-05-30 ENCOUNTER — Other Ambulatory Visit: Payer: Medicare Other

## 2014-05-30 ENCOUNTER — Telehealth: Payer: Self-pay | Admitting: Oncology

## 2014-05-30 ENCOUNTER — Ambulatory Visit: Payer: Medicare Other

## 2014-05-30 VITALS — BP 98/48 | HR 75 | Temp 98.0°F | Resp 18 | Ht 77.0 in | Wt 303.9 lb

## 2014-05-30 DIAGNOSIS — I509 Heart failure, unspecified: Secondary | ICD-10-CM

## 2014-05-30 DIAGNOSIS — C801 Malignant (primary) neoplasm, unspecified: Secondary | ICD-10-CM

## 2014-05-30 DIAGNOSIS — E119 Type 2 diabetes mellitus without complications: Secondary | ICD-10-CM

## 2014-05-30 DIAGNOSIS — C250 Malignant neoplasm of head of pancreas: Secondary | ICD-10-CM

## 2014-05-30 DIAGNOSIS — I498 Other specified cardiac arrhythmias: Secondary | ICD-10-CM

## 2014-05-30 DIAGNOSIS — C772 Secondary and unspecified malignant neoplasm of intra-abdominal lymph nodes: Secondary | ICD-10-CM

## 2014-05-30 MED ORDER — LIDOCAINE-PRILOCAINE 2.5-2.5 % EX CREA: 1.0000 "application " | TOPICAL_CREAM | CUTANEOUS | Status: DC | PRN

## 2014-05-30 MED ORDER — ONDANSETRON HCL 8 MG PO TABS: 8.0000 mg | ORAL_TABLET | Freq: Three times a day (TID) | ORAL | Status: DC | PRN

## 2014-05-30 NOTE — Progress Notes (Signed)
Checked in new patient with no issues. He has prim/second insurance. He has appt card and has not been traveling.

## 2014-05-30 NOTE — Telephone Encounter (Signed)
Gave avs & cal for Dec/Jan. Sent mess to sch tx °

## 2014-05-30 NOTE — Progress Notes (Signed)
Please see consult note.  

## 2014-05-30 NOTE — Consult Note (Signed)
Reason for Referral: Locally advanced malignancy of the pancreas.   HPI: [name] is a pleasant 73 year old man currently of [place]. 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 He does have history of obesity diabetes and coronary artery disease. He was in his usual state of health when the summer he gradually started developing symptoms of fatigue, tiredness and not being himself. He was hospitalized on 05/09/2014 after symptoms of syncope and dizziness. He underwent cardiac evaluation which was unrevealing and his medications needed adjustment. During his evaluation, he developed melena and hematochezia. He underwent an endoscopy on 05/12/2014 which showed a significant tumor impinging upon the duodenum. CT scan of the chest abdomen and pelvis obtained during that hospitalization and showed a mass around the region of the pancreatic head. Local lymphadenopathy was also noted. 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 A PET/CT scan was obtained on 05/29/2014 showed a mass measuring 6.7 x 5.7 cm along the second portion of the duodenum adjacent to the pancreatic head with small hypermetabolic lymph nodes measuring 18 mm. Patient referred to me for further evaluation regarding these findings. Clinically, he still relatively weak since his discharge. He is getting stronger and his appetite is improving. He is not reporting any hematochezia or melena at this point. He is not reporting any dysphagia or odynophagia. Prior to this hospitalization, he is a very active gentleman running his business.  He does not report any fevers or chills or sweats. He did report weight loss and poor appetite. He does not report any headaches or blurry vision or no longer reporting any dizziness or syncope. He does not report any chest pain, palpitation, orthopnea but does report leg edema. He  does not report any shortness of breath, cough, hemoptysis or wheezing. He does not report any nausea, vomiting, ascites or early satiety. He does not report any frequency, urgency, hesitancy or hematuria. He does report occasional back pain but no skeletal pain or arthralgias or myalgias. He does not report any petechiae or easy bruisability. He does not report any mood problems. Rest of his review of systems unremarkable.   Past Medical History  Diagnosis Date  . Diabetes mellitus   . Hyperlipidemia   . Hypertension   . GERD (gastroesophageal reflux disease)   . Coronary artery disease     a. S/P prior PTCA in the 90's;  b. 04/2007 Cath: LM nl, LAD Ca2+ prox, LCX nl, RI nl, OM nl, RCA 40-50p, 40d, EF 60%;  b. 09/2012 Echo: EF 55-60%, mild LVH, nl wall motion, Gr 1 DD, mildly dil LA.  . Right ureteral stone   . SVT (supraventricular tachycardia)     a. s/p ablation per Dr. Lovena Le 10 -25yrs ago  . OSA on CPAP     a. cpap setting of 16  . Arthritis   . Bilateral kidney stones   . Shortness of breath dyspnea   . CKD (chronic kidney disease), stage III   :  Past Surgical History  Procedure Laterality Date  . Right ureteroscopic stone extraction  09-26-2010  . Circumcision and fulgeration of condyloma  06-22-2003  . Cardiac electrophysiology study and ablation  2002  . Transthoracic echocardiogram  03-03-2011    MODERATE LVH/ LVSF NORMAL / EF 60-65%/ MILDLY DILATED LEFT ATRIUMK  . Severeal ureteroscopic stone extractions    . Ureteroscopy  11/13/2011    Procedure: URETEROSCOPY;  Surgeon: Claybon Jabs,  MD;  Location: [place];  Service: Urology;  Laterality: Right;  RIGHT URETEROSCOPY WITH HOLMIUM LASER LIHTOTRIPSY DIGITAL URETEROSCOPE  . Coronary angioplasty  1994    [place] OF THE LAD  . Cardiac catheterization  04-27-2007    CAD WITH 30% NARROWING IN THE MID-LAD/ 50 % NARROWING PROXIMAL CIRCUMFLEX/ 40% NARROWING SECOND MARGINAL BRANCH/ 40-50% PROXIMAL RCA WITH DISTAL  POSTERIOR NARROWING/ NORMAL LVF  . Cardiac catheterization  2000    NON-OBSTRUCTIVE CAD  . Laparoscopic cholecystectomy  03-26-2005  . Total knee arthroplasty  07/18/2012    Procedure: TOTAL KNEE ARTHROPLASTY;  Surgeon: Johnn Hai, MD;  Location: [facility];  Service: Orthopedics;  Laterality: Right;  . Esophagogastroduodenoscopy N/A 05/12/2014    Procedure: ESOPHAGOGASTRODUODENOSCOPY (EGD);  Surgeon: Beryle Beams, MD;  [facility];  Service: Endoscopy;  Laterality: N/A;  :  Current Outpatient Prescriptions  Medication Sig Dispense Refill  . doxazosin (CARDURA) 2 MG tablet Take 2 mg by mouth daily before breakfast.     . furosemide (LASIX) 20 MG tablet Take 20 mg by mouth 2 (two) times daily.     . insulin NPH-regular Human (NOVOLIN 70/30) (70-30) 100 UNIT/ML injection Inject 45-65 Units into the skin 2 (two) times daily with a meal. 80 units in am with breakfast and 90 units in pm with supper    . lisinopril (PRINIVIL,ZESTRIL) 20 MG tablet Take 20 mg by mouth daily. Takes 1/2 tablet    . metFORMIN (GLUCOPHAGE-XR) 500 MG 24 hr tablet Take 500 mg by mouth daily with supper.     . metoprolol (LOPRESSOR) 100 MG tablet Take 100 mg by mouth daily. Takes half in am and half tablet in pm    . Multiple Vitamins-Minerals (PRESERVISION AREDS PO) Take 1 tablet by mouth daily.     Marland Kitchen omeprazole (PRILOSEC) 20 MG capsule Take 20 mg by mouth 2 (two) times daily before a meal.     . potassium chloride SA (K-DUR,KLOR-CON) 20 MEQ tablet Take 1 tablet (20 mEq total) by mouth 2 (two) times daily. (Patient taking differently: Take 20 mEq by mouth daily. Takes 20 meq daily) 60 tablet 6  . vancomycin (VANCOCIN) 50 mg/mL oral solution Take 2.5 mLs (125 mg total) by mouth 4 (four) times daily. 170 mL 0  . atorvastatin (LIPITOR) 20 MG tablet Take 20 mg by mouth at bedtime. ON HOLD    . clindamycin (CLEOCIN) 300 MG capsule     . fluorouracil (EFUDEX) 5 % cream     . lidocaine-prilocaine (EMLA) cream Apply 1  application topically as needed. 30 g 0  . LORazepam (ATIVAN) 0.5 MG tablet     . ondansetron (ZOFRAN) 8 MG tablet Take 1 tablet (8 mg total) by mouth every 8 (eight) hours as needed for nausea or vomiting. 20 tablet 0  . Water For Injection Sterile (STERILE WATER, PRESERVATIVE FREE,) injection      No current facility-administered medications for this visit.      Allergies  Allergen Reactions  . Clindamycin/Lincomycin Other (See Comments)    dysphagia  . Codeine Itching  . Penicillins Rash  :  Family History  Problem Relation Age of Onset  . Heart attack Mother   . Heart attack Brother   . Colon cancer Neg Hx   . Stomach cancer Neg Hx   :  History   Social History  . Marital Status: Married    Spouse Name: N/A    Number of Children: N/A  . Years of  Education: N/A   Occupational History  . Not on file.   Social History Main Topics  . Smoking status: Former Smoker -- 1.00 packs/day for 15 years    Types: Cigarettes    Quit date: 06/15/1986  . Smokeless tobacco: Never Used  . Alcohol Use: Yes     Comment: occasional  . Drug Use: No  . Sexual Activity: Not on file   Other Topics Concern  . Not on file   Social History Narrative  :  Pertinent items are noted in HPI.  Exam: Blood pressure 98/48, pulse 75, temperature 98 F (36.7 C), temperature source Oral, resp. rate 18, height 6\' 5"  (1.956 m), weight 303 lb 14.4 oz (137.848 kg). General appearance: alert and cooperative Head: Normocephalic, without obvious abnormality Nose: Nares normal. Septum midline. Mucosa normal. No drainage or sinus tenderness. Throat: lips, mucosa, and tongue normal; teeth and gums normal Neck: no adenopathy Back: negative Resp: clear to auscultation bilaterally Chest wall: no tenderness Cardio: regular rate and rhythm, S1, S2 normal, no murmur, click, rub or gallop GI: soft, non-tender; bowel sounds normal; no masses,  no organomegaly Extremities: extremities normal,  atraumatic, no cyanosis or edema and edema 2+ bilaterally Pulses: 2+ and symmetric Skin: Skin color, texture, turgor normal. No rashes or lesions Lymph nodes: Cervical, supraclavicular, and axillary nodes normal. Neurologic: Grossly normal  CBC    Component Value Date/Time   WBC 21.9* 05/19/2014 0500   RBC 2.97* 05/19/2014 0500   HGB 9.2* 05/19/2014 0500   HCT 27.8* 05/19/2014 0500   PLT 196 05/19/2014 0500   MCV 93.6 05/19/2014 0500   MCH 31.0 05/19/2014 0500   MCHC 33.1 05/19/2014 0500   RDW 14.3 05/19/2014 0500   LYMPHSABS 1.9 05/17/2014 0800   MONOABS 2.3* 05/17/2014 0800   EOSABS 0.1 05/17/2014 0800   BASOSABS 0.1 05/17/2014 0800      Chemistry      Component Value Date/Time   NA 137 05/19/2014 0500   K 4.1 05/19/2014 0500   CL 105 05/19/2014 0500   CO2 20 05/19/2014 0500   BUN 21 05/19/2014 0500   CREATININE 1.11 05/19/2014 0500      Component Value Date/Time   CALCIUM 8.2* 05/19/2014 0500   ALKPHOS 66 05/17/2014 0600   AST 30 05/17/2014 0600   ALT 33 05/17/2014 0600   BILITOT 0.6 05/17/2014 0600       Ct Abdomen Pelvis W Contrast  05/10/2014   CLINICAL DATA:  Leukocytosis, fever of unknown origin, short of breath, chest pain and dizziness.  EXAM: CT ANGIOGRAPHY CHEST  CT ABDOMEN AND PELVIS WITH CONTRAST  TECHNIQUE: Multidetector CT imaging of the chest was performed using the standard protocol during bolus administration of intravenous contrast. Multiplanar CT image reconstructions and MIPs were obtained to evaluate the vascular anatomy. Multidetector CT imaging of the abdomen and pelvis was performed using the standard protocol during bolus administration of intravenous contrast.  CONTRAST:  25mL OMNIPAQUE IOHEXOL 350 MG/ML SOLN  COMPARISON:  80 mL Omnipaque 350  FINDINGS: CTA CHEST FINDINGS  No filling defects within the pulmonary arteries to suggest acute pulmonary embolism. No acute findings aorta great vessels. No pericardial fluid. Esophagus is normal.   Review of the lung parenchyma demonstrates no suspicious pulmonary nodules. No airspace disease. No pleural fluid.  CT ABDOMEN and PELVIS FINDINGS  Abdominal portion was scanned at a delayed time . Therefore it is essentially a noncontrast exam.  Hepatobiliary: No focal hepatic lesion identified on this noncontrast exam. Post  cholecystectomy.  Pancreas: There is an mass at the level of the pancreatic head measuring 5.2 x 3.1 x 6.1 cm. This mass is along the medial border of the second portion the duodenum and compressive the lumen of the duodenum duodenum. There is enlarged lymph node adjacent to the mass measuring 19 mm. No pancreatic duct dilatation. There is no biliary duct dilatation.  Spleen: Normal spleen appear  Adrenals/Urinary Tract: Adrenal glands are normal. Delayed imaging of the kidneys demonstrate no obstruction. Ureters and bladder normal.  Stomach/Bowel: Stomach, small bowel and appendix are normal. The colon rectosigmoid colon are normal. There are diverticula of the sigmoid colon acute inflammation.  Mass in the C-loop of the duodenum described in the pancreatic section.  Vascular/Lymphatic: There is scattered calcification of the abdominal aorta. No retroperitoneal periportal lymphadenopathy. No mesenteric or peritoneal metastasis.  Reproductive: Prostate gland and bladder normal. No pelvic lymphadenopathy.  Other: No free fluid in the abdomen pelvis.  No free air  Musculoskeletal: No aggressive osseous lesion.  Review of the MIP images confirms the above findings.  IMPRESSION: Chest Impression:  No evidence of acute pulmonary embolism.  Abdomen / Pelvis Impression:  1. Mass adjacent to pancreatic head and along the C-loop of the duodenum measuring 6 cm. No pancreatic duct dilatation or biliary duct dilatation. Differential includes pancreatic neoplasm, gastrointestinal stromal tumor, or lymphoma. Recommend endoscopic ultrasound with biopsy of this lesion.  2. Local lymphadenopathy with enlarged  peripancreatic lymph node.  3. Liver parenchyma is poorly evaluated on this essentially noncontrast exam.  Findings conveyed toDAWOOD ELGERGAWY on 05/10/2014  at19:17.   Electronically Signed   By: Suzy Bouchard M.D.   On: 05/10/2014 19:18   Nm Pet Image Initial (pi) Skull Base To Thigh  05/29/2014   CLINICAL DATA:  Initial treatment strategy for poorly differentiated carcinoma of unclear primary.  EXAM: NUCLEAR MEDICINE PET SKULL BASE TO THIGH  TECHNIQUE: 15.9 mCi F-18 FDG was injected intravenously. Full-ring PET imaging was performed from the skull base to thigh after the radiotracer. CT data was obtained and used for attenuation correction and anatomic localization.  FASTING BLOOD GLUCOSE:  Value: 181 mg/dl  COMPARISON:  CT 05/10/2014  FINDINGS: NECK  No hypermetabolic lymph nodes in the neck.  CHEST  No hypermetabolic mediastinal or hilar nodes. No suspicious pulmonary nodules on the CT scan.  ABDOMEN/PELVIS  There is again demonstrated a mass along the second portion duodenum adjacent to pancreatic head measuring approximately 6.7 x 5.7 cm not changed in volume compared to prior. This mass is intensely hypermetabolic with SUV equal 49.7. Posterior to the mass there is a small hypermetabolic lymph node measuring 18 mm on image 143.  No evidence of hepatic metastasis. No additional metabolic lesions in the abdomen or pelvis. There is stranding around the left and right kidney.  SKELETON  No focal hypermetabolic activity to suggest skeletal metastasis.  IMPRESSION: 1. Mass along the C-loop of the duodenum adjacent to pancreatic head is intensely hypermetabolic consistent with carcinoma. 2. Adjacent small peripancreatic hypermetabolic lymph node. 3. No additional hypermetabolic adenopathy or metastatic disease.   Electronically Signed   By: Suzy Bouchard M.D.   On: 05/29/2014 16:17   mality is noted.  IMPRESSION: No acute abnormality seen.   Electronically Signed   By: Inez Catalina M.D.   On:  05/09/2014 08:23    Assessment and Plan:   73 year old gentleman with the following issues  1. Head of the pancreas presumed mass measuring 6.7 x 5.7 cm with  local adenopathy likely indicating locally advanced pancreatic cancer. He is biopsy proven to have malignancy and the regional lymph nodes. The natural course of this disease was discussed extensively with the patient, his wife and his daughters. Given the fact that the pathology indicating a likely pancreaticobiliary tumor this will be treated as pancreatic cancer moving forward. The PET scan did not show any evidence of distant metastasis but certainly indicating a large tumor with regional adenopathy. I doubt this is a resectable cancer nor do I think Mr. Habeeb is a good candidate for a Whipple procedure. Having said that, I will discuss this with Dr. Barry Dienes to make sure that he is indeed not resectable at this time.  Alternatively, we can consider treatment with upfront radiation therapy with chemotherapy an attempt to make this tumor resectable and then evaluate him for possible surgical resection. I think that would be a reasonable approach moving forward.   The logistics of administering single agent weekly gemcitabine with radiation was discussed today. Complications from the stroke would include nausea, vomiting, myelosuppression, neutropenia, neutropenic sepsis, bleeding as well as infusion related complications. Alternative regimens would include 5-FU and Xeloda-based regimens. Complications with these drugs were also discussed including drug drug interaction as well as adherence issues with oral regimen.  After discussing the risks and benefits of these different agencies agreeable to proceed with gemcitabine after chemotherapy education class.  I will refer him to radiation oncology for an evaluation and anticipate the start of chemotherapy the first week of radiation.  2. IV access: Risks and benefits of a Port-A-Cath insertion was  discussed. Complications such as bleeding, thrombosis, infection was discussed and he is agreeable to proceed.  3. Nausea prophylaxis: Prescription for Zofran was given to the patient with instructions how to use it.  4. Diabetes mellitus: The implication of chemotherapy with diabetes mellitus was discussed. Complications include worsening neuropathy as well as hyperglycemia needs to be monitored.  5. Congestive heart failure: He has already peripheral edema which could be exacerbated by systemic chemotherapy and would needs to monitor him closely.  6. Cardiac arrhythmia: Chemotherapy for his cancer can affect electrolytes and certainly can make him predispose to cardiac arrhythmia. We need to monitor this closely with every treatment.  7. Follow-up: I anticipate the start of radiation therapy after the new year and will schedule follow-up at that time.

## 2014-05-31 ENCOUNTER — Encounter: Payer: Self-pay | Admitting: Radiation Oncology

## 2014-05-31 ENCOUNTER — Other Ambulatory Visit: Payer: Self-pay

## 2014-05-31 ENCOUNTER — Other Ambulatory Visit (HOSPITAL_COMMUNITY): Payer: Self-pay

## 2014-05-31 ENCOUNTER — Encounter (HOSPITAL_COMMUNITY): Payer: Self-pay

## 2014-05-31 ENCOUNTER — Inpatient Hospital Stay (HOSPITAL_COMMUNITY)
Admission: EM | Admit: 2014-05-31 | Discharge: 2014-06-14 | DRG: 871 | Disposition: A | Discharge status: Skilled Nursing Facility | Payer: Medicare Other | Attending: Internal Medicine | Admitting: Internal Medicine

## 2014-05-31 ENCOUNTER — Inpatient Hospital Stay (HOSPITAL_COMMUNITY): Payer: Medicare Other

## 2014-05-31 ENCOUNTER — Emergency Department (HOSPITAL_COMMUNITY): Payer: Medicare Other

## 2014-05-31 ENCOUNTER — Telehealth: Payer: Self-pay | Admitting: Medical Oncology

## 2014-05-31 DIAGNOSIS — C259 Malignant neoplasm of pancreas, unspecified: Secondary | ICD-10-CM | POA: Insufficient documentation

## 2014-05-31 DIAGNOSIS — D62 Acute posthemorrhagic anemia: Secondary | ICD-10-CM | POA: Diagnosis present

## 2014-05-31 DIAGNOSIS — E1169 Type 2 diabetes mellitus with other specified complication: Secondary | ICD-10-CM | POA: Diagnosis present

## 2014-05-31 DIAGNOSIS — I2510 Atherosclerotic heart disease of native coronary artery without angina pectoris: Secondary | ICD-10-CM | POA: Diagnosis present

## 2014-05-31 DIAGNOSIS — A419 Sepsis, unspecified organism: Secondary | ICD-10-CM | POA: Diagnosis present

## 2014-05-31 DIAGNOSIS — Z9861 Coronary angioplasty status: Secondary | ICD-10-CM

## 2014-05-31 DIAGNOSIS — Z96651 Presence of right artificial knee joint: Secondary | ICD-10-CM | POA: Diagnosis present

## 2014-05-31 DIAGNOSIS — Z7401 Bed confinement status: Secondary | ICD-10-CM | POA: Diagnosis not present

## 2014-05-31 DIAGNOSIS — M542 Cervicalgia: Secondary | ICD-10-CM | POA: Diagnosis present

## 2014-05-31 DIAGNOSIS — M25519 Pain in unspecified shoulder: Secondary | ICD-10-CM | POA: Diagnosis present

## 2014-05-31 DIAGNOSIS — Z9049 Acquired absence of other specified parts of digestive tract: Secondary | ICD-10-CM | POA: Diagnosis present

## 2014-05-31 DIAGNOSIS — K830 Cholangitis: Secondary | ICD-10-CM | POA: Diagnosis present

## 2014-05-31 DIAGNOSIS — K315 Obstruction of duodenum: Secondary | ICD-10-CM | POA: Diagnosis present

## 2014-05-31 DIAGNOSIS — K8309 Other cholangitis: Secondary | ICD-10-CM | POA: Insufficient documentation

## 2014-05-31 DIAGNOSIS — E785 Hyperlipidemia, unspecified: Secondary | ICD-10-CM | POA: Diagnosis present

## 2014-05-31 DIAGNOSIS — Z794 Long term (current) use of insulin: Secondary | ICD-10-CM

## 2014-05-31 DIAGNOSIS — N179 Acute kidney failure, unspecified: Secondary | ICD-10-CM | POA: Diagnosis not present

## 2014-05-31 DIAGNOSIS — K219 Gastro-esophageal reflux disease without esophagitis: Secondary | ICD-10-CM | POA: Diagnosis present

## 2014-05-31 DIAGNOSIS — E440 Moderate protein-calorie malnutrition: Secondary | ICD-10-CM | POA: Diagnosis present

## 2014-05-31 DIAGNOSIS — K831 Obstruction of bile duct: Secondary | ICD-10-CM

## 2014-05-31 DIAGNOSIS — K121 Other forms of stomatitis: Secondary | ICD-10-CM | POA: Diagnosis not present

## 2014-05-31 DIAGNOSIS — R7989 Other specified abnormal findings of blood chemistry: Secondary | ICD-10-CM

## 2014-05-31 DIAGNOSIS — Z7982 Long term (current) use of aspirin: Secondary | ICD-10-CM | POA: Diagnosis not present

## 2014-05-31 DIAGNOSIS — F329 Major depressive disorder, single episode, unspecified: Secondary | ICD-10-CM | POA: Diagnosis present

## 2014-05-31 DIAGNOSIS — D630 Anemia in neoplastic disease: Secondary | ICD-10-CM | POA: Diagnosis present

## 2014-05-31 DIAGNOSIS — I5031 Acute diastolic (congestive) heart failure: Secondary | ICD-10-CM

## 2014-05-31 DIAGNOSIS — I959 Hypotension, unspecified: Secondary | ICD-10-CM

## 2014-05-31 DIAGNOSIS — Z8249 Family history of ischemic heart disease and other diseases of the circulatory system: Secondary | ICD-10-CM | POA: Diagnosis not present

## 2014-05-31 DIAGNOSIS — Z87891 Personal history of nicotine dependence: Secondary | ICD-10-CM

## 2014-05-31 DIAGNOSIS — R627 Adult failure to thrive: Secondary | ICD-10-CM | POA: Diagnosis present

## 2014-05-31 DIAGNOSIS — G4733 Obstructive sleep apnea (adult) (pediatric): Secondary | ICD-10-CM | POA: Diagnosis present

## 2014-05-31 DIAGNOSIS — Z880 Allergy status to penicillin: Secondary | ICD-10-CM

## 2014-05-31 DIAGNOSIS — L03116 Cellulitis of left lower limb: Secondary | ICD-10-CM | POA: Diagnosis present

## 2014-05-31 DIAGNOSIS — D72829 Elevated white blood cell count, unspecified: Secondary | ICD-10-CM

## 2014-05-31 DIAGNOSIS — C250 Malignant neoplasm of head of pancreas: Secondary | ICD-10-CM | POA: Diagnosis present

## 2014-05-31 DIAGNOSIS — D649 Anemia, unspecified: Secondary | ICD-10-CM

## 2014-05-31 DIAGNOSIS — N183 Chronic kidney disease, stage 3 (moderate): Secondary | ICD-10-CM | POA: Diagnosis present

## 2014-05-31 DIAGNOSIS — D500 Iron deficiency anemia secondary to blood loss (chronic): Secondary | ICD-10-CM | POA: Diagnosis present

## 2014-05-31 DIAGNOSIS — A047 Enterocolitis due to Clostridium difficile: Secondary | ICD-10-CM | POA: Diagnosis present

## 2014-05-31 DIAGNOSIS — I129 Hypertensive chronic kidney disease with stage 1 through stage 4 chronic kidney disease, or unspecified chronic kidney disease: Secondary | ICD-10-CM | POA: Diagnosis present

## 2014-05-31 DIAGNOSIS — Z9181 History of falling: Secondary | ICD-10-CM | POA: Diagnosis not present

## 2014-05-31 DIAGNOSIS — I5033 Acute on chronic diastolic (congestive) heart failure: Secondary | ICD-10-CM | POA: Diagnosis present

## 2014-05-31 DIAGNOSIS — R945 Abnormal results of liver function studies: Secondary | ICD-10-CM

## 2014-05-31 DIAGNOSIS — Z452 Encounter for adjustment and management of vascular access device: Secondary | ICD-10-CM

## 2014-05-31 DIAGNOSIS — R109 Unspecified abdominal pain: Secondary | ICD-10-CM | POA: Insufficient documentation

## 2014-05-31 DIAGNOSIS — R1084 Generalized abdominal pain: Secondary | ICD-10-CM

## 2014-05-31 DIAGNOSIS — R0600 Dyspnea, unspecified: Secondary | ICD-10-CM

## 2014-05-31 HISTORY — DX: Malignant (primary) neoplasm, unspecified: C80.1

## 2014-05-31 LAB — URINALYSIS, ROUTINE W REFLEX MICROSCOPIC
BILIRUBIN URINE: NEGATIVE
Bilirubin Urine: NEGATIVE
GLUCOSE, UA: NEGATIVE mg/dL
Glucose, UA: NEGATIVE mg/dL
Hgb urine dipstick: NEGATIVE
Hgb urine dipstick: NEGATIVE
Ketones, ur: NEGATIVE mg/dL
Ketones, ur: NEGATIVE mg/dL
LEUKOCYTES UA: NEGATIVE
LEUKOCYTES UA: NEGATIVE
NITRITE: NEGATIVE
Nitrite: NEGATIVE
PH: 5 (ref 5.0–8.0)
PROTEIN: NEGATIVE mg/dL
Protein, ur: NEGATIVE mg/dL
SPECIFIC GRAVITY, URINE: 1.025 (ref 1.005–1.030)
Specific Gravity, Urine: 1.012 (ref 1.005–1.030)
Urobilinogen, UA: 0.2 mg/dL (ref 0.0–1.0)
Urobilinogen, UA: 0.2 mg/dL (ref 0.0–1.0)
pH: 5 (ref 5.0–8.0)

## 2014-05-31 LAB — COMPREHENSIVE METABOLIC PANEL WITH GFR
ALT: 18 U/L (ref 0–53)
AST: 21 U/L (ref 0–37)
Albumin: 1.5 g/dL — ABNORMAL LOW (ref 3.5–5.2)
Alkaline Phosphatase: 65 U/L (ref 39–117)
Anion gap: 14 (ref 5–15)
BUN: 30 mg/dL — ABNORMAL HIGH (ref 6–23)
CO2: 20 meq/L (ref 19–32)
Calcium: 8.4 mg/dL (ref 8.4–10.5)
Chloride: 96 meq/L (ref 96–112)
Creatinine, Ser: 1.33 mg/dL (ref 0.50–1.35)
GFR calc Af Amer: 60 mL/min — ABNORMAL LOW
GFR calc non Af Amer: 52 mL/min — ABNORMAL LOW
Glucose, Bld: 249 mg/dL — ABNORMAL HIGH (ref 70–99)
Potassium: 5.3 meq/L (ref 3.7–5.3)
Sodium: 130 meq/L — ABNORMAL LOW (ref 137–147)
Total Bilirubin: 0.6 mg/dL (ref 0.3–1.2)
Total Protein: 5.8 g/dL — ABNORMAL LOW (ref 6.0–8.3)

## 2014-05-31 LAB — I-STAT TROPONIN, ED: Troponin i, poc: 0.02 ng/mL (ref 0.00–0.08)

## 2014-05-31 LAB — CBC WITH DIFFERENTIAL/PLATELET
Basophils Absolute: 0 K/uL (ref 0.0–0.1)
Basophils Relative: 0 % (ref 0–1)
Eosinophils Absolute: 0 K/uL (ref 0.0–0.7)
Eosinophils Relative: 0 % (ref 0–5)
HCT: 24.5 % — ABNORMAL LOW (ref 39.0–52.0)
Hemoglobin: 7.9 g/dL — ABNORMAL LOW (ref 13.0–17.0)
Lymphocytes Relative: 7 % — ABNORMAL LOW (ref 12–46)
Lymphs Abs: 2.5 K/uL (ref 0.7–4.0)
MCH: 30.5 pg (ref 26.0–34.0)
MCHC: 32.2 g/dL (ref 30.0–36.0)
MCV: 94.6 fL (ref 78.0–100.0)
Monocytes Absolute: 2.9 K/uL — ABNORMAL HIGH (ref 0.1–1.0)
Monocytes Relative: 8 % (ref 3–12)
Neutro Abs: 30.6 K/uL — ABNORMAL HIGH (ref 1.7–7.7)
Neutrophils Relative %: 85 % — ABNORMAL HIGH (ref 43–77)
Platelets: 314 K/uL (ref 150–400)
RBC: 2.59 MIL/uL — ABNORMAL LOW (ref 4.22–5.81)
RDW: 15 % (ref 11.5–15.5)
WBC: 36 K/uL — ABNORMAL HIGH (ref 4.0–10.5)

## 2014-05-31 LAB — I-STAT CHEM 8, ED
BUN: 29 mg/dL — ABNORMAL HIGH (ref 6–23)
CALCIUM ION: 1.13 mmol/L (ref 1.13–1.30)
CREATININE: 1.3 mg/dL (ref 0.50–1.35)
Chloride: 102 mEq/L (ref 96–112)
Glucose, Bld: 245 mg/dL — ABNORMAL HIGH (ref 70–99)
HCT: 25 % — ABNORMAL LOW (ref 39.0–52.0)
Hemoglobin: 8.5 g/dL — ABNORMAL LOW (ref 13.0–17.0)
Potassium: 5.2 mEq/L (ref 3.7–5.3)
Sodium: 134 mEq/L — ABNORMAL LOW (ref 137–147)
TCO2: 19 mmol/L (ref 0–100)

## 2014-05-31 LAB — PROTIME-INR
INR: 1.24 (ref 0.00–1.49)
Prothrombin Time: 15.7 s — ABNORMAL HIGH (ref 11.6–15.2)

## 2014-05-31 LAB — SAMPLE TO BLOOD BANK

## 2014-05-31 LAB — GLUCOSE, CAPILLARY
GLUCOSE-CAPILLARY: 248 mg/dL — AB (ref 70–99)
Glucose-Capillary: 232 mg/dL — ABNORMAL HIGH (ref 70–99)

## 2014-05-31 LAB — PREPARE RBC (CROSSMATCH)

## 2014-05-31 LAB — MRSA PCR SCREENING: MRSA by PCR: NEGATIVE

## 2014-05-31 LAB — LIPASE, BLOOD: Lipase: 21 U/L (ref 11–59)

## 2014-05-31 LAB — CBG MONITORING, ED: GLUCOSE-CAPILLARY: 216 mg/dL — AB (ref 70–99)

## 2014-05-31 LAB — I-STAT CG4 LACTIC ACID, ED: Lactic Acid, Venous: 3.66 mmol/L — ABNORMAL HIGH (ref 0.5–2.2)

## 2014-05-31 MED ORDER — FUROSEMIDE 10 MG/ML IJ SOLN
20.0000 mg | Freq: Once | INTRAMUSCULAR | Status: AC
  Administered 2014-05-31: 20 mg via INTRAVENOUS
  Filled 2014-05-31: qty 2

## 2014-05-31 MED ORDER — ONDANSETRON HCL 4 MG PO TABS
4.0000 mg | ORAL_TABLET | Freq: Four times a day (QID) | ORAL | Status: DC | PRN
Start: 2014-05-31 — End: 2014-06-14

## 2014-05-31 MED ORDER — SODIUM CHLORIDE 0.9 % IJ SOLN
10.0000 mL | Freq: Two times a day (BID) | INTRAMUSCULAR | Status: DC
  Administered 2014-05-31 – 2014-06-01 (×2): 10 mL
  Administered 2014-06-01: 20 mL
  Administered 2014-06-02: 12 mL
  Administered 2014-06-02 – 2014-06-13 (×8): 10 mL

## 2014-05-31 MED ORDER — INSULIN ASPART PROT & ASPART (70-30 MIX) 100 UNIT/ML ~~LOC~~ SUSP
30.0000 [IU] | Freq: Two times a day (BID) | SUBCUTANEOUS | Status: DC
  Administered 2014-05-31 – 2014-06-01 (×2): 30 [IU] via SUBCUTANEOUS
  Filled 2014-05-31: qty 10

## 2014-05-31 MED ORDER — PANTOPRAZOLE SODIUM 40 MG PO TBEC
40.0000 mg | DELAYED_RELEASE_TABLET | Freq: Every day | ORAL | Status: DC
  Administered 2014-05-31 – 2014-06-14 (×13): 40 mg via ORAL
  Filled 2014-05-31 (×15): qty 1

## 2014-05-31 MED ORDER — ONDANSETRON HCL 4 MG/2ML IJ SOLN: 4.0000 mg | Freq: Four times a day (QID) | INTRAMUSCULAR | Status: DC | PRN

## 2014-05-31 MED ORDER — SODIUM CHLORIDE 0.9 % IV SOLN
INTRAVENOUS | Status: DC
  Administered 2014-05-31 – 2014-06-06 (×2): via INTRAVENOUS

## 2014-05-31 MED ORDER — FENTANYL CITRATE 0.05 MG/ML IJ SOLN
100.0000 ug | Freq: Once | INTRAMUSCULAR | Status: AC
  Administered 2014-05-31: 100 ug via INTRAVENOUS
  Filled 2014-05-31: qty 2

## 2014-05-31 MED ORDER — VANCOMYCIN HCL 10 G IV SOLR
2500.0000 mg | INTRAVENOUS | Status: DC
  Filled 2014-05-31: qty 2500

## 2014-05-31 MED ORDER — SODIUM CHLORIDE 0.9 % IV BOLUS (SEPSIS)
1000.0000 mL | Freq: Once | INTRAVENOUS | Status: AC
  Administered 2014-05-31: 1000 mL via INTRAVENOUS

## 2014-05-31 MED ORDER — SODIUM CHLORIDE 0.9 % IJ SOLN
10.0000 mL | INTRAMUSCULAR | Status: DC | PRN
  Administered 2014-06-07 (×2): 10 mL
  Filled 2014-05-31 (×2): qty 40

## 2014-05-31 MED ORDER — IOHEXOL 300 MG/ML  SOLN
100.0000 mL | Freq: Once | INTRAMUSCULAR | Status: AC | PRN
  Administered 2014-05-31: 100 mL via INTRAVENOUS

## 2014-05-31 MED ORDER — DOXAZOSIN MESYLATE 2 MG PO TABS
2.0000 mg | ORAL_TABLET | Freq: Every day | ORAL | Status: DC
  Administered 2014-06-01 – 2014-06-13 (×13): 2 mg via ORAL
  Filled 2014-05-31 (×16): qty 1

## 2014-05-31 MED ORDER — VANCOMYCIN HCL 10 G IV SOLR: 1750.0000 mg | INTRAVENOUS | Status: DC

## 2014-05-31 MED ORDER — INSULIN ASPART 100 UNIT/ML ~~LOC~~ SOLN
0.0000 [IU] | Freq: Three times a day (TID) | SUBCUTANEOUS | Status: DC
  Administered 2014-05-31: 3 [IU] via SUBCUTANEOUS
  Administered 2014-06-01: 5 [IU] via SUBCUTANEOUS

## 2014-05-31 MED ORDER — VANCOMYCIN 50 MG/ML ORAL SOLUTION
125.0000 mg | Freq: Four times a day (QID) | ORAL | Status: DC
  Administered 2014-05-31 – 2014-06-02 (×7): 125 mg via ORAL
  Filled 2014-05-31 (×11): qty 2.5

## 2014-05-31 MED ORDER — VANCOMYCIN HCL 10 G IV SOLR
2500.0000 mg | INTRAVENOUS | Status: AC
  Administered 2014-05-31: 2500 mg via INTRAVENOUS
  Filled 2014-05-31: qty 2500

## 2014-05-31 MED ORDER — MORPHINE SULFATE 2 MG/ML IJ SOLN
1.0000 mg | INTRAMUSCULAR | Status: DC | PRN
  Administered 2014-06-01: 1 mg via INTRAVENOUS
  Filled 2014-05-31 (×2): qty 1

## 2014-05-31 MED ORDER — IOHEXOL 300 MG/ML  SOLN
50.0000 mL | Freq: Once | INTRAMUSCULAR | Status: AC | PRN
  Administered 2014-05-31: 50 mL via ORAL

## 2014-05-31 MED ORDER — SODIUM CHLORIDE 0.9 % IV SOLN
INTRAVENOUS | Status: DC
  Administered 2014-05-31: 18:00:00 via INTRAVENOUS

## 2014-05-31 MED ORDER — DEXTROSE 5 % IV SOLN
1.0000 g | Freq: Three times a day (TID) | INTRAVENOUS | Status: DC
  Administered 2014-05-31 – 2014-06-04 (×13): 1 g via INTRAVENOUS
  Filled 2014-05-31 (×14): qty 1

## 2014-05-31 NOTE — ED Notes (Signed)
Patient transported to CT 

## 2014-05-31 NOTE — Progress Notes (Signed)
Clinical Social Work Department BRIEF PSYCHOSOCIAL ASSESSMENT 05/31/2014  Patient:  [name], [name]     Account Number:  [id number]     Admit date:  05/31/2014  Clinical Social Worker:  Jesusmanuel Erbes Inez Catalina  Date/Time:  05/31/2014 12:00 M  Referred by:  CSW  Date Referred:  05/31/2014 Referred for  SNF Placement   Other Referral:   Interview type:  Patient Other interview type:   pt son    PSYCHOSOCIAL DATA Living Status:  FAMILY Admitted from facility:   Level of care:   Primary support name:  Devin Going Primary support relationship to patient:  SPOUSE Degree of support available:   Pt lives at home with wife, strong support    CURRENT CONCERNS Current Concerns  Post-Acute Placement   Other Concerns:    SOCIAL WORK ASSESSMENT / PLAN CSW met iwht pt at bedside to complete psychosocial assessment. CSW met with pt at bedside along with pt son. Pt shares that he has been weaker, but working wiht physical therapy through home health services. CSW explained csw role and services. CSW offered information on skilled nursing if recommended by physcial therapy.   Assessment/plan status:  Psychosocial Support/Ongoing Assessment of Needs Other assessment/ plan:   Information/referral to community resources:   skilled nursing faiclity lsit    PATIENT'S/FAMILY'S RESPONSE TO PLAN OF CARE: Patient and pt son thanked csw for concern and support. Pt hopes to return home when meidcally stable to resume home health services. Pt and pt son open to speaking further about snf if needed.        Noreene Larsson [phone]  ED CSW 05/31/2014 1:58 PM

## 2014-05-31 NOTE — Telephone Encounter (Signed)
Patient spouse called office stating that patient "had a horrible day, weak and with excruciating pain." Spouse states that they spoke with pt's PCP and PCP suggesting Dr. Alen Blew direct admit pt for pain mgmt and eval. Review with MD, per MD informed spouse to take pt to ED for evaluation/assessment. Spouse confirms she will take pt to [facility] ED. Knows to call office/clinic with further questions/concerns.

## 2014-05-31 NOTE — ED Notes (Signed)
CBG 216. RN made aware.

## 2014-05-31 NOTE — H&P (Addendum)
[facility] History and Physical  [name] HBZ:[id number] DOB: [birth year] DOA: 05/31/2014  Referring physician: ED physician PCP: [name], MD   Chief Complaint: dyspnea, abd pain   HPI:  Pt is 73 yo very pleasant male with HTN, HLD, DM type II (on insulin and Metformin), pancreatic cancer (follows with Dr. Alen Blew), recently hospitalized for C. Diff and still on oral Vancomycin, now presented to [facility] ED with main concern of several days duration of progressively worsening abdominal pain, intermittent in nature and throbbing, worse on the left side and occasionally but not consistently radiating to the right side, 7/10 in severity when present, with no specific alleviating or aggravating factors. This has been associated with nausea and poor oral intake, several lbs weight loss, fatigue, dyspnea on exertion. He denies any diarrhea or constipation, no blood in stool, no specific urinary concerns, no blood in urine, no fevers, chills,no chest pain.   In ED, pt noted to be in mild distress due to pain. VS notable for BP 85/43 mmHg, HR 113 bpm, RR 22 bpm, T 99.43F. Blood work notable for WBC > 30K, Hg 7.9 (repeat Hg 8.5, prior to transfusion). CT abd with ? Duodenitis. TRH asked to admit for further evaluation and management of sepsis of unclear etiology. SDU requested. 2 U PRBC requested.   Assessment and Plan: Active Problems: Sepsis - criteria met on admission with tachycardia, leukocytosis, fever, RR 22, hypotension - possibly related to ? intra-abd source (? Duodenitis on CT abd) - admission to SDU - start with providing supportive care, broad spectrum ABX Vanc and Aztreonam  - pt has received 2 L NS since admission and since he is somewhat volume overloaded on exam will hold off on further IVF as BP is now stable 117/75 mmHg (at the time of the admission - will place order for repeat C. Diff (pt has no diarrhea but says he has not had it last time either and C. Diff was  positive) - place orders for blood and urine culture - if BP drops, can be given small boluses (if needed) - check lactic acid, repeat CBC in AM Acute on chronic blood loss anemia, of pancreatic cancer, DM - review of records indicate that Hg is ~9 at baseline - pt denies any blood in stool or urine - cancer is always possible source of bleed - will obtain FOBT - pt received one unit of blood and second one pending  - give one dose of Lasix in between PRBC - repeat CBC in AM - may need to call GI in AM (pt sees Dr Benson Norway), depends on stability of Hg and any signs of bleeding  Leukocytosis - certainly worrisome for recurrent C. Diff, PCR pending  - continuing oral Vanc DM, type II, with complications of anemia and CKD II - check A1C - continue low dose Lantus for now until oral intake improves - hold Metformin and place on SSI for now  HTN - holding home medical regimen Metoprolol and Lisinopril - restart once BP stable and antihypertensive regimen indicated  - please note K is on high end of normal, likely not ACEI induced as pt was taking potassium supplements at home  RUE edema > LUE - will ask for doppler, r/o blood clot  Mild LLE cellulitis  - from edema - current ABX should cover  HLD - on statin  CKD stage II - Cr is now stable and WNL Pancreatic cancer - I have notified Dr. 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 No evidence of obstruction.  Stable mild peripancreatic lymphadenopathy, consistent metastatic disease.  Increased hazy opacity surrounding the pancreatic head and duodenum and small amount of fluid within Morison's pouch. This may be related to pancreatitis or duodenitis.  Bilateral nonobstructive nephrolithiasis and diverticulosis incidentally noted.    Nm Pet Image Initial (pi) Skull Base To Thigh  05/29/2014 1. Mass along the C-loop of the duodenum adjacent to pancreatic head is intensely hypermetabolic consistent with carcinoma. 2. Adjacent small peripancreatic hypermetabolic lymph node. 3. No additional hypermetabolic adenopathy or metastatic disease.     Dg Abd Acute W/ches  05/31/2014  Unremarkable bowel gas pattern. No obstruction or free air. Left base atelectasis. No lung edema or consolidation.      Code Status: Full Family Communication: Pt and daughter at bedside Disposition Plan: Admit for further evaluation     Review of Systems:  Constitutional:  Negative for diaphoresis.  HENT: Negative for hearing loss, ear pain, nosebleeds, congestion, sore throat, neck pain, tinnitus and ear discharge.   Eyes: Negative for blurred vision, double vision, photophobia, pain, discharge and redness.  Respiratory: Negative for  wheezing and stridor.   Cardiovascular: Negative for chest pain, palpitations, orthopnea.  Gastrointestinal: Negative for heartburn, constipation, blood in stool and melena.  Genitourinary: Negative for dysuria, urgency, frequency, hematuria and flank pain.  Musculoskeletal: Negative for myalgias, back pain, joint pain and falls.  Skin: Negative for itching and rash.  Neurological: Negative for dizziness Endo/Heme/Allergies: Negative for environmental allergies and  polydipsia. Does not bruise/bleed easily.  Psychiatric/Behavioral: Negative for suicidal ideas. The patient is not nervous/anxious.      Past Medical History  Diagnosis Date  . Diabetes mellitus   . Hyperlipidemia   . Hypertension   . GERD (gastroesophageal reflux disease)   . Coronary artery disease     a. S/P prior PTCA in the 90's;  b. 04/2007 Cath: LM nl, LAD Ca2+ prox, LCX nl, RI nl, OM nl, RCA 40-50p, 40d, EF 60%;  b. 09/2012 Echo: EF 55-60%, mild LVH, nl wall motion, Gr 1 DD, mildly dil LA.  . Right ureteral stone   . SVT (supraventricular tachycardia)     a. s/p ablation per Dr. Lovena Le 10 -49yr ago  . OSA on CPAP     a. cpap setting of 16  . Arthritis   . Bilateral kidney stones   . Shortness of breath dyspnea   . CKD (chronic kidney disease), stage III   . Cancer     pancreatic    Past Surgical History  Procedure Laterality Date  . Right ureteroscopic stone extraction  09-26-2010  . Circumcision and fulgeration of condyloma  06-22-2003  .  Cardiac electrophysiology study and ablation  2002  . Transthoracic echocardiogram  03-03-2011    MODERATE LVH/ LVSF NORMAL / EF 60-65%/ MILDLY DILATED LEFT ATRIUMK  . Severeal ureteroscopic stone extractions    . Ureteroscopy  11/13/2011    Procedure: URETEROSCOPY;  Surgeon: Claybon Jabs, MD;  Location: [facility];  Service: Urology;  Laterality: Right;  RIGHT URETEROSCOPY WITH HOLMIUM LASER LIHTOTRIPSY DIGITAL URETEROSCOPE  . Coronary angioplasty  1994    [place] OF THE LAD  . Cardiac catheterization  04-27-2007    CAD WITH 30% NARROWING IN THE MID-LAD/ 50 % NARROWING PROXIMAL CIRCUMFLEX/ 40% NARROWING SECOND MARGINAL BRANCH/ 40-50% PROXIMAL RCA WITH DISTAL POSTERIOR NARROWING/ NORMAL LVF  . Cardiac catheterization  2000    NON-OBSTRUCTIVE CAD  . Laparoscopic cholecystectomy  03-26-2005  . Total knee arthroplasty  07/18/2012    Procedure: TOTAL KNEE ARTHROPLASTY;  Surgeon: Johnn Hai, MD;  Location: [facility];   Service: Orthopedics;  Laterality: Right;  . Esophagogastroduodenoscopy N/A 05/12/2014    Procedure: ESOPHAGOGASTRODUODENOSCOPY (EGD);  Surgeon: Beryle Beams, MD;  [facility] END[facility]COPY;  Service: Endoscopy;  Laterality: N/A;    Social History:  reports that he quit smoking about 27 years ago. His smoking use included Cigarettes. He has a 15 pack-year smoking history. He has never used smokeless tobacco. He reports that he drinks alcohol. He reports that he does not use illicit drugs.  Allergies  Allergen Reactions  . Clindamycin/Lincomycin Other (See Comments)    dysphagia  . Codeine Itching  . Penicillins Rash    Family History  Problem Relation Age of Onset  . Heart attack Mother   . Heart attack Brother   . Colon cancer Neg Hx   . Stomach cancer Neg Hx     Prior to Admission medications   Medication Sig Start Date End Date Taking? Authorizing Provider  aspirin EC 81 MG tablet Take 81 mg by mouth daily.   Yes Historical Provider, MD  atorvastatin (LIPITOR) 20 MG tablet Take 20 mg by mouth at bedtime. ON HOLD   Yes Historical Provider, MD  doxazosin (CARDURA) 2 MG tablet Take 2 mg by mouth daily before breakfast.    Yes Historical Provider, MD  furosemide (LASIX) 20 MG tablet Take 40 mg by mouth every morning.  01/09/14  Yes Burnell Blanks, MD  insulin NPH-regular Human (NOVOLIN 70/30) (70-30) 100 UNIT/ML injection Inject 75-85 Units into the skin 2 (two) times daily with a meal. 75 units in am with breakfast and 85 units in pm with supper   Yes Historical Provider, MD  lisinopril (PRINIVIL,ZESTRIL) 20 MG tablet Take 10 mg by mouth daily.    Yes Historical Provider, MD  metFORMIN (GLUCOPHAGE-XR) 500 MG 24 hr tablet Take 500 mg by mouth daily with supper.  05/01/14  Yes Historical Provider, MD  metoprolol (LOPRESSOR) 100 MG tablet Take 50 mg by mouth 2 (two) times daily. Takes half in am and half tablet in pm   Yes Historical Provider, MD  Multiple Vitamins-Minerals  (PRESERVISION AREDS PO) Take 1 tablet by mouth 2 (two) times daily.   Yes Historical Provider, MD  omeprazole (PRILOSEC) 20 MG capsule Take 20 mg by mouth 2 (two) times daily.    Yes Historical Provider, MD  ondansetron (ZOFRAN) 8 MG tablet Take 1 tablet (8 mg total) by mouth every 8 (eight) hours as needed for nausea or vomiting. 05/30/14  Yes Wyatt Portela, MD  potassium chloride SA (K-DUR,KLOR-CON) 20 MEQ tablet Take  1 tablet (20 mEq total) by mouth 2 (two) times daily. Patient taking differently: Take 20 mEq by mouth daily. Takes 20 meq daily 01/09/14  Yes Burnell Blanks, MD  vancomycin (VANCOCIN) 50 mg/mL oral solution Take 2.5 mLs (125 mg total) by mouth 4 (four) times daily. 05/19/14 06/01/14 Yes Marianne L York, PA-C  clindamycin (CLEOCIN) 300 MG capsule Take 300 mg by mouth 3 (three) times daily.    Historical Provider, MD  fluorouracil (EFUDEX) 5 % cream Apply 1 application topically 2 (two) times daily.    Historical Provider, MD  lidocaine-prilocaine (EMLA) cream Apply 1 application topically as needed. Patient taking differently: Apply 1 application topically as needed (port access).  05/30/14   Wyatt Portela, MD  LORazepam (ATIVAN) 0.5 MG tablet Take 0.5 mg by mouth every 6 (six) hours as needed for anxiety (nausea).  05/23/14   Historical Provider, MD  Water For Injection Sterile (STERILE WATER, PRESERVATIVE FREE,) injection Give 10 mLs by tube 4 (four) times daily.  05/18/14   Historical Provider, MD    Physical Exam: Filed Vitals:   05/31/14 1430 05/31/14 1434 05/31/14 1445 05/31/14 1451  BP: 116/46  115/44 111/49  Pulse: 74  73 73  Temp:  97.4 F (36.3 C)  98 F (36.7 C)  TempSrc:  Oral  Oral  Resp: _0 SpO2: 97%  92% 95%    Physical Exam  Constitutional: Appears well-developed and well-nourished. No distress.  HENT: Normocephalic. External right and left ear normal. Oropharynx is clear and moist.  Eyes: Conjunctivae and EOM are normal. PERRLA, no scleral  icterus.  Neck: Normal ROM. Neck supple. No JVD. No tracheal deviation. No thyromegaly.  CVS: RRR, S1/S2 +, no murmurs, no gallops, no carotid bruit.  Pulmonary: Effort and breath sounds normal, no stridor, rhonchi, wheezes, rales.  Abdominal: Soft. BS +,  Slight distension, tenderness, rebound or guarding.  Musculoskeletal: Normal range of motion. +1 bilateral LE edema, RUE > LUE edema, mild cellulitis in the left LE Lymphadenopathy: No lymphadenopathy noted, cervical, inguinal. Neuro: Alert. Normal reflexes, muscle tone coordination. No cranial nerve deficit. Skin: Skin is warm and dry. No rash noted. Not diaphoretic. No erythema. No pallor.  Psychiatric: Normal mood and affect. Behavior, judgment, thought content normal.   Labs on Admission:  Basic Metabolic Panel:  Recent Labs Lab 05/31/14 1116 05/31/14 1130  NA 130* 134*  K 5.3 5.2  CL 96 102  CO2 20  --   GLUCOSE 249* 245*  BUN 30* 29*  CREATININE 1.33 1.30  CALCIUM 8.4  --    Liver Function Tests:  Recent Labs Lab 05/31/14 1116  AST 21  ALT 18  ALKPHOS 65  BILITOT 0.6  PROT 5.8*  ALBUMIN 1.5*    Recent Labs Lab 05/31/14 1116  LIPASE 21   CBC:  Recent Labs Lab 05/31/14 1116 05/31/14 1130  WBC 36.0*  --   NEUTROABS 30.6*  --   HGB 7.9* 8.5*  HCT 24.5* 25.0*  MCV 94.6  --   PLT 314  --    CBG:  Recent Labs Lab 05/29/14 1418 05/31/14 1054  GLUCAP 181* 216*    EKG: Normal sinus rhythm, no ST/T wave changes  [name], MD  [facility] Pager [phone]  If 7PM-7AM, please contact night-coverage [email or web address] Password [email or web address] 05/31/2014, 2:53 PM

## 2014-05-31 NOTE — Progress Notes (Signed)
Peripherally Inserted Central Catheter/Midline Placement  The IV Nurse has discussed with the patient and/or persons authorized to consent for the patient, the purpose of this procedure and the potential benefits and risks involved with this procedure.  The benefits include less needle sticks, lab draws from the catheter and patient may be discharged home with the catheter.  Risks include, but not limited to, infection, bleeding, blood clot (thrombus formation), and puncture of an artery; nerve damage and irregular heat beat.  Alternatives to this procedure were also discussed.  PICC/Midline Placement Documentation  PICC / Midline Double Lumen 31/51/76 PICC Right Basilic 48 cm 0 cm (Active)  Indication for Insertion or Continuance of Line Prolonged intravenous therapies 05/31/2014 10:00 PM  Exposed Catheter (cm) 0 cm 05/31/2014 10:00 PM  Site Assessment Clean;Dry;Intact 05/31/2014 10:00 PM  Dressing Change Due 06/07/14 05/31/2014 10:00 PM       Jaquane Boughner, Maricela Bo 05/31/2014, 10:47 PM

## 2014-05-31 NOTE — Progress Notes (Signed)
ANTIBIOTIC CONSULT NOTE - INITIAL  Pharmacy Consult for Vancomycin / Aztreonam Indication: Sepsis  Allergies  Allergen Reactions  . Clindamycin/Lincomycin Other (See Comments)    dysphagia  . Codeine Itching  . Penicillins Rash    Patient Measurements:   Adjusted Body Weight:   Vital Signs: Temp: 98 F (36.7 C) (12/17 1451) Temp Source: Oral (12/17 1451) BP: 111/49 mmHg (12/17 1451) Pulse Rate: 73 (12/17 1451) Intake/Output from previous day:   Intake/Output from this shift:    Labs:  Recent Labs  05/31/14 1116 05/31/14 1130  WBC 36.0*  --   HGB 7.9* 8.5*  PLT 314  --   CREATININE 1.33 1.30   Estimated Creatinine Clearance: 78.9 mL/min (by C-G formula based on Cr of 1.3). No results for input(s): VANCOTROUGH, VANCOPEAK, VANCORANDOM, GENTTROUGH, GENTPEAK, GENTRANDOM, TOBRATROUGH, TOBRAPEAK, TOBRARND, AMIKACINPEAK, AMIKACINTROU, AMIKACIN in the last 72 hours.   Microbiology: Recent Results (from the past 720 hour(s))  Blood Culture (routine x 2)     Status: None   Collection Time: 05/09/14  9:10 AM  Result Value Ref Range Status   Specimen Description BLOOD LEFT FOREARM  Final   Special Requests BOTTLES DRAWN AEROBIC AND ANAEROBIC 5CC  Final   Culture  Setup Time   Final    05/09/2014 18:03 Performed at [facility]    Culture   Final    NO GROWTH 5 DAYS Performed at [facility]    Report Status 05/15/2014 FINAL  Final  Urine culture     Status: None   Collection Time: 05/09/14  9:55 AM  Result Value Ref Range Status   Specimen Description URINE, CLEAN CATCH  Final   Special Requests NONE  Final   Culture  Setup Time   Final    05/09/2014 18:23 Performed at [place] Performed at [facility]   Final   Culture NO GROWTH Performed at [facility]   Final   Report Status 05/10/2014 FINAL  Final  Blood culture (routine x 2)     Status: None   Collection Time: 05/09/14 11:40 AM   Result Value Ref Range Status   Specimen Description BLOOD LEFT HAND  Final   Special Requests BOTTLES DRAWN AEROBIC ONLY 3CCS  Final   Culture  Setup Time   Final    05/09/2014 18:03 Performed at [facility]    Culture   Final    NO GROWTH 5 DAYS Performed at [facility]    Report Status 05/15/2014 FINAL  Final  MRSA PCR Screening     Status: None   Collection Time: 05/11/14  6:48 AM  Result Value Ref Range Status   MRSA by PCR NEGATIVE NEGATIVE Final    Comment:        The GeneXpert MRSA Assay (FDA approved for NASAL specimens only), is one component of a comprehensive MRSA colonization surveillance program. It is not intended to diagnose MRSA infection nor to guide or monitor treatment for MRSA infections.   Culture, blood (routine x 2)     Status: None   Collection Time: 05/16/14  3:25 PM  Result Value Ref Range Status   Specimen Description BLOOD RIGHT HAND  Final   Special Requests BOTTLES DRAWN AEROBIC AND ANAEROBIC 10CCS  Final   Culture  Setup Time   Final    05/16/2014 21:17 Performed at [place]   Final    NO  GROWTH 5 DAYS Performed at [facility]    Report Status 05/22/2014 FINAL  Final  Culture, blood (routine x 2)     Status: None   Collection Time: 05/16/14  3:40 PM  Result Value Ref Range Status   Specimen Description BLOOD RIGHT FOREARM  Final   Special Requests BOTTLES DRAWN AEROBIC AND ANAEROBIC 10CC  Final   Culture  Setup Time   Final    05/16/2014 21:17 Performed at [facility]    Culture   Final    NO GROWTH 5 DAYS Performed at [facility]    Report Status 05/22/2014 FINAL  Final  Clostridium Difficile by PCR     Status: Abnormal   Collection Time: 05/17/14  5:41 PM  Result Value Ref Range Status   C difficile by pcr POSITIVE (A) NEGATIVE Final    Comment: CRITICAL RESULT CALLED TO, READ BACK BY AND VERIFIED WITH: S. HOESLER 11:00 05/18/14 ([email or web address]) S.HOESLER RN       Medical History: Past Medical History  Diagnosis Date  . Diabetes mellitus   . Hyperlipidemia   . Hypertension   . GERD (gastroesophageal reflux disease)   . Coronary artery disease     a. S/P prior PTCA in the 90's;  b. 04/2007 Cath: LM nl, LAD Ca2+ prox, LCX nl, RI nl, OM nl, RCA 40-50p, 40d, EF 60%;  b. 09/2012 Echo: EF 55-60%, mild LVH, nl wall motion, Gr 1 DD, mildly dil LA.  . Right ureteral stone   . SVT (supraventricular tachycardia)     a. s/p ablation per Dr. Lovena Le 10 -43yrs ago  . OSA on CPAP     a. cpap setting of 16  . Arthritis   . Bilateral kidney stones   . Shortness of breath dyspnea   . CKD (chronic kidney disease), stage III   . Pancreatic cancer 05/17/14    Pancreas  . Cancer dx Dec 2015    pancreatic  . Allergy     Assessment: 37 yoM recently hospitalized and diagnosed with pancreatic cancer with plans to start chemotherapy in January now presents with increased weakness, pain, and SOB and found to be hypotensive.  PMHx significant for CKD-III, DM, HLD, HTN, and CAD.    Pharmacy consulted to start vancomycin and aztreonam for sepsis. Pt also continued on outpt regimen of oral vancomycin for past C.Diff infection.  Note allergies to penicillins (rash) and clindamycin (dysphagia).   12/17 >> Vancomycin  >> 12/17 >> Aztreonam >>    Tmax: 99.1 WBCs: Elevated - 36K Renal: Hx CKD-III, SCr 1.30, CrCl ~79 ml/min (N 52)  12/17 blood: ordered 12/17 urine: ordered   Goal of Therapy:  Vancomycin trough level 15-20 mcg/ml Eradication of infection  Plan:  Aztreonam 1g IV q8h Vancomycin 2500mg  IV x 1 now, then start 1750mg  q24h F/u renal function, clinical course  Ralene Bathe, PharmD, BCPS 05/31/2014, 4:35 PM  Pager: [phone]

## 2014-05-31 NOTE — Progress Notes (Signed)
  CARE MANAGEMENT ED NOTE 05/31/2014  Patient:  [name], [name]   Account Number:  [id number]  Date Initiated:  05/31/2014  Documentation initiated by:  Livia Snellen  Subjective/Objective Assessment:   Patient presents to Ed with increased weakness and increased generalized pain, shortness of breath, decreased po iintake     Subjective/Objective Assessment Detail:   Patient with pmhx of DM, HTN, CKD,GERD, pancreatic cancer. Patient recently admitted to hospital  from 11/25 to 12/05, for hypotension, leukocytosis, palpitations, diagnosed with pancreatic cancer.  WBC 36.0 H&H 7.9 24.5     Action/Plan:   Action/Plan Detail:   Anticipated DC Date:       Status Recommendation to Physician:   Result of Recommendation:    Other ED Services  Consult Working [place]  CM consult  Other    Choice offered to / List presented to:           [place].    Status of service:  Completed, signed off  ED Comments:   ED Comments Detail:  [facility] noted patient active with [place] for home health services.  EDCM left voice mail for Erasmo Downer, transition home specialist for [facility] to inform her of patient's admission at 1542pm.

## 2014-05-31 NOTE — ED Notes (Addendum)
Pt c/o increased weakness x 2 weeks and  Intermittent pain in neck, upper back, and generalized abdominal pain x 3 weeks.  Currently, denies pain.  Denies n/v/d.  Reports he has not been eating well.  Pt was recently admitted for 11 days and diagnosed w/ pancreatic CA.  Sts he has not started any treatments.

## 2014-05-31 NOTE — Progress Notes (Signed)
GI Location of Tumor / Histology: Pancreas,mass around region of pancreatic head  Ophelia Charter presented  months ago with symptoms of: fatigue,syncope, and dizziness,   Stomach ache and back for about a month, Hospitalized,and develeped melena and hematochezia,endoscopy 05/12/14 =tumor impinging upon duodenum  Biopsies of  Revealed:Diagnosis 05/17/2014:  Lymph node, biopsy, Peri-pancreatic/duodenal lymph node- METASTATIC POORLY DIFFERENTIATED CARCINOMA IN 1 OF 1 LYMPH NODE (1/1).  Past/Anticipated interventions by surgeon, if any: 05/12/14 =tumor impinging upon duodenum, Dr. Rosanne Ashing  Past/Anticipated interventions by medical oncology, if any: Dr. Alen Blew 05/30/14, scheduled chemotherapy education for 06/05/14, appt with lab,Dr.Shdada and chemotherapy 06/20/2014  Weight changes, if any: weight loss and poor appetite   Bowel/Bladder complaints, if any:   Nausea / Vomiting, if any:   Pain issues, if any: occasional back pain   SAFETY ISSUES:  Prior radiation? NO  Pacemaker/ICD? NO  Is the patient on methotrexate? NO  Current Complaints / other details: Married, 5 children,  Retired Editor, commissioning, and [place], former cigarette smoker  1ppd 15  years,quit  06/15/1986 , alcohol occasionally,no illicit drug use Cardiac electrophysiology study and ablation Dr. Lovena Le,  2002, Cardiac Cathterization=2008, , CAD DM type II, OSA,on c-pap, Chronic kidney disease stage III   mother MI,Brother,MI,   Allergies: PCN=rash,Codeine=itching

## 2014-05-31 NOTE — ED Provider Notes (Signed)
CSN: [id number]     Arrival date & time 05/31/14  1031 History   First MD Initiated Contact with Patient 05/31/14 1050     Chief Complaint  Patient presents with  . Weakness     (Consider location/radiation/quality/duration/timing/severity/associated sxs/prior Treatment) Patient is a 73 y.o. male presenting with weakness. The history is provided by the patient.  Weakness This is a new problem. Associated symptoms include abdominal pain and shortness of breath. Pertinent negatives include no chest pain and no headaches.   patient has relatively recently been diagnosed with pancreatic cancer. He was inpatient in the hospital for around 11 days and was discharged home 2 weeks ago. He has been doing somewhat worse since. There is plan for chemotherapy and radiation. He has had decreased oral intake. No diarrhea. He had a fall week and a half ago and landed on his right side. States he has pain on his left abdomen, through the right side. States he's been more shortness of breath and fatigue with any exertion. He states it is more fatigued and shortness of breath. No chest pain but has been having abdominal pain. Transfused blood level he was inpatient.  Past Medical History  Diagnosis Date  . Diabetes mellitus   . Hyperlipidemia   . Hypertension   . GERD (gastroesophageal reflux disease)   . Coronary artery disease     a. S/P prior PTCA in the 90's;  b. 04/2007 Cath: LM nl, LAD Ca2+ prox, LCX nl, RI nl, OM nl, RCA 40-50p, 40d, EF 60%;  b. 09/2012 Echo: EF 55-60%, mild LVH, nl wall motion, Gr 1 DD, mildly dil LA.  . Right ureteral stone   . SVT (supraventricular tachycardia)     a. s/p ablation per Dr. Lovena Le 10 -66yrs ago  . OSA on CPAP     a. cpap setting of 16  . Arthritis   . Bilateral kidney stones   . Shortness of breath dyspnea   . CKD (chronic kidney disease), stage III   . Cancer     pancreatic   Past Surgical History  Procedure Laterality Date  . Right ureteroscopic stone  extraction  09-26-2010  . Circumcision and fulgeration of condyloma  06-22-2003  . Cardiac electrophysiology study and ablation  2002  . Transthoracic echocardiogram  03-03-2011    MODERATE LVH/ LVSF NORMAL / EF 60-65%/ MILDLY DILATED LEFT ATRIUMK  . Severeal ureteroscopic stone extractions    . Ureteroscopy  11/13/2011    Procedure: URETEROSCOPY;  Surgeon: Claybon Jabs, MD;  Location: [facility];  Service: Urology;  Laterality: Right;  RIGHT URETEROSCOPY WITH HOLMIUM LASER LIHTOTRIPSY DIGITAL URETEROSCOPE  . Coronary angioplasty  1994    Mims OF THE LAD  . Cardiac catheterization  04-27-2007    CAD WITH 30% NARROWING IN THE MID-LAD/ 50 % NARROWING PROXIMAL CIRCUMFLEX/ 40% NARROWING SECOND MARGINAL BRANCH/ 40-50% PROXIMAL RCA WITH DISTAL POSTERIOR NARROWING/ NORMAL LVF  . Cardiac catheterization  2000    NON-OBSTRUCTIVE CAD  . Laparoscopic cholecystectomy  03-26-2005  . Total knee arthroplasty  07/18/2012    Procedure: TOTAL KNEE ARTHROPLASTY;  Surgeon: Johnn Hai, MD;  Location: [facility];  Service: Orthopedics;  Laterality: Right;  . Esophagogastroduodenoscopy N/A 05/12/2014    Procedure: ESOPHAGOGASTRODUODENOSCOPY (EGD);  Surgeon: Beryle Beams, MD;  Location: [facility]COPY;  Service: Endoscopy;  Laterality: N/A;   Family History  Problem Relation Age of Onset  . Heart attack Mother   . Heart attack Brother   . Colon  cancer Neg Hx   . Stomach cancer Neg Hx    History  Substance Use Topics  . Smoking status: Former Smoker -- 1.00 packs/day for 15 years    Types: Cigarettes    Quit date: 06/15/1986  . Smokeless tobacco: Never Used  . Alcohol Use: Yes     Comment: occasional    Review of Systems  Constitutional: Positive for appetite change and fatigue. Negative for fever, chills and activity change.  Eyes: Negative for pain.  Respiratory: Positive for shortness of breath.   Cardiovascular: Negative for chest pain.  Gastrointestinal: Positive for nausea and  abdominal pain. Negative for vomiting.  Endocrine: Negative for polydipsia and polyphagia.  Genitourinary: Negative for flank pain.  Musculoskeletal: Negative for back pain.  Skin: Positive for pallor.  Neurological: Positive for weakness and light-headedness. Negative for headaches.      Allergies  Clindamycin/lincomycin; Codeine; and Penicillins  Home Medications   Prior to Admission medications   Medication Sig Start Date End Date Taking? Authorizing Provider  aspirin EC 81 MG tablet Take 81 mg by mouth daily.   Yes Historical Provider, MD  atorvastatin (LIPITOR) 20 MG tablet Take 20 mg by mouth at bedtime. ON HOLD   Yes Historical Provider, MD  doxazosin (CARDURA) 2 MG tablet Take 2 mg by mouth daily before breakfast.    Yes Historical Provider, MD  furosemide (LASIX) 20 MG tablet Take 40 mg by mouth every morning.  01/09/14  Yes Burnell Blanks, MD  insulin NPH-regular Human (NOVOLIN 70/30) (70-30) 100 UNIT/ML injection Inject 75-85 Units into the skin 2 (two) times daily with a meal. 75 units in am with breakfast and 85 units in pm with supper   Yes Historical Provider, MD  lisinopril (PRINIVIL,ZESTRIL) 20 MG tablet Take 10 mg by mouth daily.    Yes Historical Provider, MD  metFORMIN (GLUCOPHAGE-XR) 500 MG 24 hr tablet Take 500 mg by mouth daily with supper.  05/01/14  Yes Historical Provider, MD  metoprolol (LOPRESSOR) 100 MG tablet Take 50 mg by mouth 2 (two) times daily. Takes half in am and half tablet in pm   Yes Historical Provider, MD  Multiple Vitamins-Minerals (PRESERVISION AREDS PO) Take 1 tablet by mouth 2 (two) times daily.   Yes Historical Provider, MD  omeprazole (PRILOSEC) 20 MG capsule Take 20 mg by mouth 2 (two) times daily.    Yes Historical Provider, MD  ondansetron (ZOFRAN) 8 MG tablet Take 1 tablet (8 mg total) by mouth every 8 (eight) hours as needed for nausea or vomiting. 05/30/14  Yes Wyatt Portela, MD  potassium chloride SA (K-DUR,KLOR-CON) 20 MEQ  tablet Take 1 tablet (20 mEq total) by mouth 2 (two) times daily. Patient taking differently: Take 20 mEq by mouth daily. Takes 20 meq daily 01/09/14  Yes Burnell Blanks, MD  vancomycin (VANCOCIN) 50 mg/mL oral solution Take 2.5 mLs (125 mg total) by mouth 4 (four) times daily. 05/19/14 06/01/14 Yes Marianne L York, PA-C  clindamycin (CLEOCIN) 300 MG capsule Take 300 mg by mouth 3 (three) times daily.    Historical Provider, MD  fluorouracil (EFUDEX) 5 % cream Apply 1 application topically 2 (two) times daily.    Historical Provider, MD  lidocaine-prilocaine (EMLA) cream Apply 1 application topically as needed. Patient taking differently: Apply 1 application topically as needed (port access).  05/30/14   Wyatt Portela, MD  LORazepam (ATIVAN) 0.5 MG tablet Take 0.5 mg by mouth every 6 (six) hours as needed for anxiety (  nausea).  05/23/14   Historical Provider, MD  Water For Injection Sterile (STERILE WATER, PRESERVATIVE FREE,) injection Give 10 mLs by tube 4 (four) times daily.  05/18/14   Historical Provider, MD   BP 111/49 mmHg  Pulse 73  Temp(Src) 98 F (36.7 C) (Oral)  Resp 18  SpO2 95% Physical Exam  Constitutional: He is oriented to person, place, and time. He appears well-developed.  HENT:  Head: Normocephalic.  Neck: Neck supple.  Cardiovascular: Normal rate.   Hypotension  Pulmonary/Chest: Effort normal.  Tachypnea  Abdominal: There is tenderness. There is no rebound.  Tenderness to epigastric to right upper quadrant.  Musculoskeletal: He exhibits no edema.  Neurological: He is alert and oriented to person, place, and time.  Skin: There is pallor.    ED Course  Procedures (including critical care time) Labs Review Labs Reviewed  CBC WITH DIFFERENTIAL - Abnormal; Notable for the following:    WBC 36.0 (*)    RBC 2.59 (*)    Hemoglobin 7.9 (*)    HCT 24.5 (*)    Neutrophils Relative % 85 (*)    Lymphocytes Relative 7 (*)    Neutro Abs 30.6 (*)    Monocytes  Absolute 2.9 (*)    All other components within normal limits  PROTIME-INR - Abnormal; Notable for the following:    Prothrombin Time 15.7 (*)    All other components within normal limits  COMPREHENSIVE METABOLIC PANEL - Abnormal; Notable for the following:    Sodium 130 (*)    Glucose, Bld 249 (*)    BUN 30 (*)    Total Protein 5.8 (*)    Albumin 1.5 (*)    GFR calc non Af Amer 52 (*)    GFR calc Af Amer 60 (*)    All other components within normal limits  CBG MONITORING, ED - Abnormal; Notable for the following:    Glucose-Capillary 216 (*)    All other components within normal limits  I-STAT CHEM 8, ED - Abnormal; Notable for the following:    Sodium 134 (*)    BUN 29 (*)    Glucose, Bld 245 (*)    Hemoglobin 8.5 (*)    HCT 25.0 (*)    All other components within normal limits  I-STAT CG4 LACTIC ACID, ED - Abnormal; Notable for the following:    Lactic Acid, Venous 3.66 (*)    All other components within normal limits  LIPASE, BLOOD  URINALYSIS, ROUTINE W REFLEX MICROSCOPIC  I-STAT TROPOININ, ED  SAMPLE TO BLOOD BANK  PREPARE RBC (CROSSMATCH)  TYPE AND SCREEN    Imaging Review Ct Abdomen Pelvis W Contrast  05/31/2014   CLINICAL DATA:  Generalized abdominal pain, worsening over past 2 weeks. Newly diagnosed pancreatic carcinoma.  EXAM: CT ABDOMEN AND PELVIS WITH CONTRAST  TECHNIQUE: Multidetector CT imaging of the abdomen and pelvis was performed using the standard protocol following bolus administration of intravenous contrast.  CONTRAST:  150mL OMNIPAQUE IOHEXOL 300 MG/ML SOLN, 30mL OMNIPAQUE IOHEXOL 300 MG/ML SOLN  COMPARISON:  05/12/2014  FINDINGS: Lower Chest: Tiny bilateral pleural effusions and dependent atelectasis.  Hepatobiliary: No masses or other significant abnormality identified. Prior cholecystectomy noted.  Pancreas: 6.4 cm mass involving the pancreatic head and descending duodenum. This is not significant changed in size compared to previous study. Increased  hazy opacity is seen surrounding the duodenum and pancreatic head with small amount of fluid seen in Morison's pouch. This may be due to duodenitis or pancreatitis.  Spleen:  Within normal limits in size and appearance.  Adrenal Glands:  No mass identified.  Kidneys/Urinary Tract: Tiny less than 1 cm bilateral renal calculi are again noted. No evidence of ureteral calculi or hydronephrosis. Bilateral perinephric soft tissue stranding and small amount of fluid are stable in appearance and nonspecific.  Stomach/Bowel/Peritoneum: No evidence of wall thickening, mass, or obstruction. Colonic diverticulosis noted. No evidence of diverticulitis.  Vascular/Lymphatic: Peripancreatic lymphadenopathy noted, with largest lymph node measuring 1.8 cm on image 40, which is unchanged.  Reproductive:  No mass or other significant abnormality identified.  Other:  None.  Musculoskeletal:  No suspicious bone lesions identified.  IMPRESSION: Stable 6 cm mass involving the pancreatic head and duodenum, consistent with known pancreatic carcinoma. No evidence of obstruction.  Stable mild peripancreatic lymphadenopathy, consistent metastatic disease.  Increased hazy opacity surrounding the pancreatic head and duodenum and small amount of fluid within Morison's pouch. This may be related to pancreatitis or duodenitis.  Bilateral nonobstructive nephrolithiasis and diverticulosis incidentally noted.   Electronically Signed   By: Earle Gell M.D.   On: 05/31/2014 14:36   Nm Pet Image Initial (pi) Skull Base To Thigh  05/29/2014   CLINICAL DATA:  Initial treatment strategy for poorly differentiated carcinoma of unclear primary.  EXAM: NUCLEAR MEDICINE PET SKULL BASE TO THIGH  TECHNIQUE: 15.9 mCi F-18 FDG was injected intravenously. Full-ring PET imaging was performed from the skull base to thigh after the radiotracer. CT data was obtained and used for attenuation correction and anatomic localization.  FASTING BLOOD GLUCOSE:  Value: 181  mg/dl  COMPARISON:  CT 05/10/2014  FINDINGS: NECK  No hypermetabolic lymph nodes in the neck.  CHEST  No hypermetabolic mediastinal or hilar nodes. No suspicious pulmonary nodules on the CT scan.  ABDOMEN/PELVIS  There is again demonstrated a mass along the second portion duodenum adjacent to pancreatic head measuring approximately 6.7 x 5.7 cm not changed in volume compared to prior. This mass is intensely hypermetabolic with SUV equal 14.4. Posterior to the mass there is a small hypermetabolic lymph node measuring 18 mm on image 143.  No evidence of hepatic metastasis. No additional metabolic lesions in the abdomen or pelvis. There is stranding around the left and right kidney.  SKELETON  No focal hypermetabolic activity to suggest skeletal metastasis.  IMPRESSION: 1. Mass along the C-loop of the duodenum adjacent to pancreatic head is intensely hypermetabolic consistent with carcinoma. 2. Adjacent small peripancreatic hypermetabolic lymph node. 3. No additional hypermetabolic adenopathy or metastatic disease.   Electronically Signed   By: Suzy Bouchard M.D.   On: 05/29/2014 16:17   Dg Abd Acute W/chest  05/31/2014   CLINICAL DATA:  Abdominal pain with nausea.  Pancreatic carcinoma.  EXAM: ACUTE ABDOMEN SERIES (ABDOMEN 2 VIEW & CHEST 1 VIEW)  COMPARISON:  PET-CT May 29, 2014 ; chest radiograph May 17, 2014  FINDINGS: PA chest: There is atelectatic change in the left base. Elsewhere lungs are clear. Heart is borderline enlarged with pulmonary vascularity within normal limits. No adenopathy.  Supine and left lateral decubitus abdomen: There is moderate stool throughout the colon. There is no bowel dilatation or air-fluid level suggesting obstruction. No free air. Surgical clips are present in the right upper quadrant. There is a phlebolith in left pelvis.  IMPRESSION: Unremarkable bowel gas pattern. No obstruction or free air. Left base atelectasis. No lung edema or consolidation.   Electronically  Signed   By: Lowella Grip M.D.   On: 05/31/2014 12:22  EKG Interpretation None      MDM   Final diagnoses:  Abdominal pain  Malignant neoplasm of pancreas, unspecified location of malignancy  Anemia, unspecified anemia type  Hypotension, unspecified hypotension type  Leukocytosis    Patient presented with abdominal pain decreased oral intake. Found to be hypotensive. Has metastatic pancreatic cancer. Recent admission for anemia. Has not started chemotherapy yet. Blood pressure has improved after IV fluids and will transfuse 2 units of blood. Chest x-ray reassuring. CT scan of the abdomen done due to pain and shows only increasing fluid. Discuss with radiology believes this is likely not an infection. Had been previously diagnosed with C. difficile. Patient has had no diarrhea. Blood pressures improved. No rectal temperature. No antibiotics started by me is no clear source of infection and no fever. Will admit to step down bed.  CRITICAL CARE Performed by: Mackie Pai Total critical care time: 30 Critical care time was exclusive of separately billable procedures and treating other patients. Critical care was necessary to treat or prevent imminent or life-threatening deterioration. Critical care was time spent personally by me on the following activities: development of treatment plan with patient and/or surrogate as well as nursing, discussions with consultants, evaluation of patient's response to treatment, examination of patient, obtaining history from patient or surrogate, ordering and performing treatments and interventions, ordering and review of laboratory studies, ordering and review of radiographic studies, pulse oximetry and re-evaluation of patient's condition.     Jasper Riling. Alvino Chapel, MD 05/31/14 1510

## 2014-06-01 ENCOUNTER — Ambulatory Visit
Admit: 2014-06-01 | Discharge: 2014-06-01 | Disposition: A | Discharge status: Home / Self Care | Payer: Medicare Other | Attending: Radiation Oncology | Admitting: Radiation Oncology

## 2014-06-01 ENCOUNTER — Ambulatory Visit: Payer: Medicare Other

## 2014-06-01 ENCOUNTER — Other Ambulatory Visit: Payer: Self-pay | Admitting: Oncology

## 2014-06-01 ENCOUNTER — Ambulatory Visit
Admission: RE | Admit: 2014-06-01 | Discharge: 2014-06-01 | Disposition: A | Discharge status: Home / Self Care | Payer: Medicare Other | Source: Ambulatory Visit | Attending: Radiation Oncology | Admitting: Radiation Oncology

## 2014-06-01 DIAGNOSIS — Z510 Encounter for antineoplastic radiation therapy: Secondary | ICD-10-CM | POA: Insufficient documentation

## 2014-06-01 DIAGNOSIS — C250 Malignant neoplasm of head of pancreas: Secondary | ICD-10-CM

## 2014-06-01 DIAGNOSIS — R609 Edema, unspecified: Secondary | ICD-10-CM

## 2014-06-01 HISTORY — DX: Malignant neoplasm of pancreas, unspecified: C25.9

## 2014-06-01 HISTORY — DX: Allergy, unspecified, initial encounter: T78.40XA

## 2014-06-01 LAB — GLUCOSE, CAPILLARY
GLUCOSE-CAPILLARY: 288 mg/dL — AB (ref 70–99)
GLUCOSE-CAPILLARY: 207 mg/dL — AB (ref 70–99)
Glucose-Capillary: 298 mg/dL — ABNORMAL HIGH (ref 70–99)
Glucose-Capillary: 268 mg/dL — ABNORMAL HIGH (ref 70–99)

## 2014-06-01 LAB — BASIC METABOLIC PANEL
ANION GAP: 14 (ref 5–15)
BUN: 27 mg/dL — ABNORMAL HIGH (ref 6–23)
CHLORIDE: 99 meq/L (ref 96–112)
CO2: 20 meq/L (ref 19–32)
Calcium: 7.9 mg/dL — ABNORMAL LOW (ref 8.4–10.5)
Creatinine, Ser: 1.05 mg/dL (ref 0.50–1.35)
GFR calc Af Amer: 80 mL/min — ABNORMAL LOW (ref >90)
GFR calc non Af Amer: 69 mL/min — ABNORMAL LOW (ref >90)
Glucose, Bld: 321 mg/dL — ABNORMAL HIGH (ref 70–99)
Potassium: 4.8 mEq/L (ref 3.7–5.3)
SODIUM: 133 meq/L — AB (ref 137–147)

## 2014-06-01 LAB — URINE CULTURE

## 2014-06-01 LAB — CLOSTRIDIUM DIFFICILE BY PCR: CDIFFPCR: NEGATIVE

## 2014-06-01 LAB — CBC
HCT: 19.4 % — ABNORMAL LOW (ref 39.0–52.0)
HEMATOCRIT: 26.6 % — AB (ref 39.0–52.0)
HEMOGLOBIN: 8.6 g/dL — AB (ref 13.0–17.0)
Hemoglobin: 6.3 g/dL — CL (ref 13.0–17.0)
MCH: 30.4 pg (ref 26.0–34.0)
MCH: 29.5 pg (ref 26.0–34.0)
MCHC: 32.5 g/dL (ref 30.0–36.0)
MCHC: 32.3 g/dL (ref 30.0–36.0)
MCV: 93.7 fL (ref 78.0–100.0)
MCV: 91.1 fL (ref 78.0–100.0)
Platelets: ADEQUATE 10*3/uL (ref 150–400)
Platelets: 238 10*3/uL (ref 150–400)
RBC: 2.07 MIL/uL — ABNORMAL LOW (ref 4.22–5.81)
RBC: 2.92 MIL/uL — AB (ref 4.22–5.81)
RDW: 14.7 % (ref 11.5–15.5)
RDW: 15.9 % — ABNORMAL HIGH (ref 11.5–15.5)
WBC: 17.9 10*3/uL — ABNORMAL HIGH (ref 4.0–10.5)
WBC: 19.8 10*3/uL — AB (ref 4.0–10.5)

## 2014-06-01 LAB — LACTIC ACID, PLASMA: Lactic Acid, Venous: 2.3 mmol/L — ABNORMAL HIGH (ref 0.5–2.2)

## 2014-06-01 LAB — OCCULT BLOOD X 1 CARD TO LAB, STOOL: Fecal Occult Bld: NEGATIVE

## 2014-06-01 LAB — PROCALCITONIN
Procalcitonin: 0.26 ng/mL
Procalcitonin: 0.22 ng/mL

## 2014-06-01 LAB — HEMOGLOBIN A1C
Hgb A1c MFr Bld: 8.8 % — ABNORMAL HIGH (ref <5.7)
MEAN PLASMA GLUCOSE: 206 mg/dL — AB (ref <117)

## 2014-06-01 MED ORDER — ACETAMINOPHEN 325 MG PO TABS
650.0000 mg | ORAL_TABLET | Freq: Four times a day (QID) | ORAL | Status: DC | PRN
  Administered 2014-06-01 – 2014-06-14 (×11): 650 mg via ORAL
  Filled 2014-06-01 (×11): qty 2

## 2014-06-01 MED ORDER — INSULIN ASPART 100 UNIT/ML ~~LOC~~ SOLN
0.0000 [IU] | Freq: Three times a day (TID) | SUBCUTANEOUS | Status: DC
  Administered 2014-06-01: 3 [IU] via SUBCUTANEOUS
  Administered 2014-06-01 – 2014-06-02 (×2): 8 [IU] via SUBCUTANEOUS
  Administered 2014-06-02: 5 [IU] via SUBCUTANEOUS
  Administered 2014-06-02: 8 [IU] via SUBCUTANEOUS
  Administered 2014-06-03 (×2): 3 [IU] via SUBCUTANEOUS
  Administered 2014-06-03: 11 [IU] via SUBCUTANEOUS
  Administered 2014-06-04: 5 [IU] via SUBCUTANEOUS
  Administered 2014-06-04: 8 [IU] via SUBCUTANEOUS
  Administered 2014-06-04: 3 [IU] via SUBCUTANEOUS
  Administered 2014-06-05: 11 [IU] via SUBCUTANEOUS
  Administered 2014-06-05 (×2): 5 [IU] via SUBCUTANEOUS
  Administered 2014-06-06: 2 [IU] via SUBCUTANEOUS
  Administered 2014-06-06: 5 [IU] via SUBCUTANEOUS
  Administered 2014-06-06: 8 [IU] via SUBCUTANEOUS
  Administered 2014-06-07: 3 [IU] via SUBCUTANEOUS
  Administered 2014-06-07 – 2014-06-09 (×3): 8 [IU] via SUBCUTANEOUS
  Administered 2014-06-09: 5 [IU] via SUBCUTANEOUS
  Administered 2014-06-09: 11 [IU] via SUBCUTANEOUS
  Administered 2014-06-10: 8 [IU] via SUBCUTANEOUS
  Administered 2014-06-10 (×2): 11 [IU] via SUBCUTANEOUS

## 2014-06-01 MED ORDER — FLUCONAZOLE 100MG IVPB
100.0000 mg | INTRAVENOUS | Status: DC
  Administered 2014-06-01 – 2014-06-04 (×4): 100 mg via INTRAVENOUS
  Filled 2014-06-01 (×4): qty 50

## 2014-06-01 MED ORDER — GLUCERNA SHAKE PO LIQD
237.0000 mL | Freq: Two times a day (BID) | ORAL | Status: DC
  Administered 2014-06-02 – 2014-06-14 (×17): 237 mL via ORAL
  Filled 2014-06-01 (×26): qty 237

## 2014-06-01 MED ORDER — VANCOMYCIN HCL IN DEXTROSE 1-5 GM/200ML-% IV SOLN
1000.0000 mg | Freq: Two times a day (BID) | INTRAVENOUS | Status: DC
  Administered 2014-06-01 – 2014-06-04 (×6): 1000 mg via INTRAVENOUS
  Filled 2014-06-01 (×9): qty 200

## 2014-06-01 MED ORDER — FUROSEMIDE 10 MG/ML IJ SOLN
20.0000 mg | Freq: Once | INTRAMUSCULAR | Status: AC
  Administered 2014-06-01: 20 mg via INTRAVENOUS

## 2014-06-01 MED ORDER — INSULIN ASPART PROT & ASPART (70-30 MIX) 100 UNIT/ML ~~LOC~~ SUSP
55.0000 [IU] | Freq: Two times a day (BID) | SUBCUTANEOUS | Status: DC
  Administered 2014-06-01 – 2014-06-14 (×25): 55 [IU] via SUBCUTANEOUS
  Filled 2014-06-01 (×3): qty 10

## 2014-06-01 MED ORDER — FUROSEMIDE 10 MG/ML IJ SOLN
INTRAMUSCULAR | Status: AC
  Filled 2014-06-01: qty 2

## 2014-06-01 NOTE — Consult Note (Signed)
[phone] ________________________________  Name: [name] MRN: [id number]  Date: 05/31/2014  DOB: 06-25-1940    Inpatient    DIAGNOSIS: The primary encounter diagnosis was Malignant neoplasm of pancreas, unspecified location of malignancy. Diagnoses of Abdominal pain, Generalized abdominal pain, Anemia, unspecified anemia type, Hypotension, unspecified hypotension type, Leukocytosis, and Neck pain were also pertinent to this visit.   HISTORY OF PRESENT ILLNESS::[name] is a 73 y.o. male who is seen for an initial consultation visit regarding the patient's new diagnosis of pancreatic cancer.  The patient presented with fatigue and not feeling like himself. He also had some syncope and dizziness and underwent workup including cardiac evaluation. The patient proceeded with an upper endoscopy which showed tumor impinging into the duodenum. A CT scan of the chest abdomen and pelvis showed a mass within the region of the pancreatic head associated with local lymphadenopathy. A CT-guided biopsy was completed and this showed a poorly differentiated carcinoma favoring pancreatic or biliary origin.  The patient proceeded to undergo a PET scan on 05/29/2014. This showed a mass along the C-loop of the duodenum adjacent to the pancreatic head. This was intensely hypermetabolic consistent with carcinoma. Adjacent small peripancreatic hypermetabolic lymph node was seen with no additional hypermetabolic adenopathy or metastatic disease. The mass measured 6.7 x 5.7 cm.  The patient has been seen by medical oncology. He has been diagnosed with what appears to be a locally advanced pancreatic cancer.  I have been asked to see the patient today and was scheduled to see him earlier today as an outpatient but the patient has been admitted for worsening abdominal pain in addition to nausea and poor by mouth intake.    PREVIOUS RADIATION THERAPY: No   PAST MEDICAL HISTORY:  has a past medical  history of Diabetes mellitus; Hyperlipidemia; Hypertension; GERD (gastroesophageal reflux disease); Coronary artery disease; Right ureteral stone; SVT (supraventricular tachycardia); OSA on CPAP; Arthritis; Bilateral kidney stones; Shortness of breath dyspnea; CKD (chronic kidney disease), stage III; Pancreatic cancer (05/17/14); Cancer (dx Dec 2015); and Allergy.     PAST SURGICAL HISTORY: Past Surgical History  Procedure Laterality Date  . Right ureteroscopic stone extraction  09-26-2010  . Circumcision and fulgeration of condyloma  06-22-2003  . Cardiac electrophysiology study and ablation  2002  . Transthoracic echocardiogram  03-03-2011    MODERATE LVH/ LVSF NORMAL / EF 60-65%/ MILDLY DILATED LEFT ATRIUMK  . Severeal ureteroscopic stone extractions    . Ureteroscopy  11/13/2011    Procedure: URETEROSCOPY;  Surgeon: Claybon Jabs, MD;  Location: [facility];  Service: Urology;  Laterality: Right;  RIGHT URETEROSCOPY WITH HOLMIUM LASER LIHTOTRIPSY DIGITAL URETEROSCOPE  . Coronary angioplasty  1994    [place] OF THE LAD  . Cardiac catheterization  04-27-2007    CAD WITH 30% NARROWING IN THE MID-LAD/ 50 % NARROWING PROXIMAL CIRCUMFLEX/ 40% NARROWING SECOND MARGINAL BRANCH/ 40-50% PROXIMAL RCA WITH DISTAL POSTERIOR NARROWING/ NORMAL LVF  . Cardiac catheterization  2000    NON-OBSTRUCTIVE CAD  . Laparoscopic cholecystectomy  03-26-2005  . Total knee arthroplasty  07/18/2012    Procedure: TOTAL KNEE ARTHROPLASTY;  Surgeon: Johnn Hai, MD;  Location: [facility];  Service: Orthopedics;  Laterality: Right;  . Esophagogastroduodenoscopy N/A 05/12/2014    Procedure: ESOPHAGOGASTRODUODENOSCOPY (EGD);  Surgeon: Beryle Beams, MD;  Location: [facility];  Service: Endoscopy;  Laterality: N/A;     FAMILY HISTORY: family history includes Heart attack in his brother and  mother. There is no history of Colon cancer or Stomach cancer.   SOCIAL HISTORY:  reports that he quit smoking about  27 years ago. His smoking use included Cigarettes. He has a 15 pack-year smoking history. He has never used smokeless tobacco. He reports that he drinks alcohol. He reports that he does not use illicit drugs.   ALLERGIES: Clindamycin/lincomycin; Codeine; and Penicillins   MEDICATIONS:  Current Facility-Administered Medications  Medication Dose Route Frequency Provider Last Rate Last Dose  . 0.9 %  sodium chloride infusion   Intravenous Q72H Theodis Blaze, MD 10 mL/hr at 05/31/14 2200    . aztreonam (AZACTAM) 1 g in dextrose 5 % 50 mL IVPB  1 g Intravenous 3 times per day Theodis Blaze, MD   1 g at 06/01/14 1443  . doxazosin (CARDURA) tablet 2 mg  2 mg Oral QAC breakfast Theodis Blaze, MD   2 mg at 06/01/14 7169  . [START ON 06/02/2014] feeding supplement (GLUCERNA SHAKE) (GLUCERNA SHAKE) liquid 237 mL  237 mL Oral BID BM Hazle Coca, RD      . fluconazole (DIFLUCAN) IVPB 100 mg  100 mg Intravenous Q24H Robbie Lis, MD   100 mg at 06/01/14 1530  . insulin aspart (novoLOG) injection 0-15 Units  0-15 Units Subcutaneous TID WC Robbie Lis, MD   3 Units at 06/01/14 1204  . insulin aspart protamine- aspart (NOVOLOG MIX 70/30) injection 55 Units  55 Units Subcutaneous BID WC Robbie Lis, MD      . morphine 2 MG/ML injection 1 mg  1 mg Intravenous Q2H PRN Theodis Blaze, MD   Stopped at 05/31/14 2144  . ondansetron (ZOFRAN) tablet 4 mg  4 mg Oral Q6H PRN Theodis Blaze, MD       Or  . ondansetron [facility]) injection 4 mg  4 mg Intravenous Q6H PRN Theodis Blaze, MD      . pantoprazole (PROTONIX) EC tablet 40 mg  40 mg Oral Daily Theodis Blaze, MD   40 mg at 06/01/14 1021  . sodium chloride 0.9 % injection 10-40 mL  10-40 mL Intracatheter Q12H Theodis Blaze, MD   20 mL at 06/01/14 1020  . sodium chloride 0.9 % injection 10-40 mL  10-40 mL Intracatheter PRN Theodis Blaze, MD      . vancomycin (VANCOCIN) 50 mg/mL oral solution 125 mg  125 mg Oral QID Theodis Blaze, MD   125 mg at 06/01/14 1443    . vancomycin (VANCOCIN) IVPB 1000 mg/200 mL premix  1,000 mg Intravenous Q12H Randall K Absher, RPH         REVIEW OF SYSTEMS:  A 15 point review of systems is documented in the electronic medical record. This was obtained by the nursing staff. However, I reviewed this with the patient to discuss relevant findings and make appropriate changes.  Pertinent items are noted in HPI.    PHYSICAL EXAM:  height is 6\' 6"  (1.981 m) and weight is 306 lb 14.1 oz (139.2 kg). His axillary temperature is 100.4 F (38 C). His blood pressure is 163/57 and his pulse is 113. His respiration is 23 and oxygen saturation is 100%.   ECOG = 1  0 - Asymptomatic (Fully active, able to carry on all predisease activities without restriction)  1 - Symptomatic but completely ambulatory (Restricted in physically strenuous activity but ambulatory and able to carry out work of a light or sedentary nature. For example,  light housework, office work)  2 - Symptomatic, <50% in bed during the day (Ambulatory and capable of all self care but unable to carry out any work activities. Up and about more than 50% of waking hours)  3 - Symptomatic, >50% in bed, but not bedbound (Capable of only limited self-care, confined to bed or chair 50% or more of waking hours)  4 - Bedbound (Completely disabled. Cannot carry on any self-care. Totally confined to bed or chair)  5 - Death   Eustace Pen MM, Creech RH, Tormey DC, et al. [phone]). "Toxicity and response criteria of the [facility] [employer]". [place] Oncol. 5 (6): 649-55    LABORATORY DATA:  Lab Results  Component Value Date   WBC 19.8* 06/01/2014   HGB 8.6* 06/01/2014   HCT 26.6* 06/01/2014   MCV 91.1 06/01/2014   PLT 238 06/01/2014   Lab Results  Component Value Date   NA 133* 06/01/2014   K 4.8 06/01/2014   CL 99 06/01/2014   CO2 20 06/01/2014   Lab Results  Component Value Date   ALT 18 05/31/2014   AST 21 05/31/2014   ALKPHOS 65 05/31/2014    BILITOT 0.6 05/31/2014      RADIOGRAPHY: Dg Chest 2 View  05/17/2014   CLINICAL DATA:  Shortness of breath and fever. History of hypertension and diabetes.  EXAM: CHEST  2 VIEW  COMPARISON:  05/13/2014; 05/09/2014; chest CT - 05/10/2014  FINDINGS: Grossly unchanged enlarged cardiac silhouette and mediastinal contours with minimal atherosclerotic plaque within the thoracic aorta. The lungs remain hyperexpanded. There is blunting of the bilateral costophrenic angles suggestive of trace bilateral pleural effusions. Unchanged bibasilar heterogeneous opacities, left greater than right, likely atelectasis. No new focal airspace opacities. No evidence of edema. No pneumothorax. Unchanged bones.  IMPRESSION: 1. Cardiomegaly and suspected trace bilateral effusions without evidence of edema. 2. Unchanged bibasilar opacities, left greater than right, favored to represent atelectasis. No discrete focal airspace opacities to suggest pneumonia.   Electronically Signed   By: Sandi Mariscal M.D.   On: 05/17/2014 09:41   Dg Cervical Spine Complete  05/31/2014   CLINICAL DATA:  Weakness for 2 weeks. Intermittent pain in neck and upper back pain.  EXAM: CERVICAL SPINE  4+ VIEWS  COMPARISON:  None.  FINDINGS: Straightening of normal cervical lordosis. There is multi level disc space narrowing and ventral endplate spurring compatible with degenerative disc disease. The prevertebral soft tissue space is normal. Bilateral neuroforaminal narrowing due to posterior spur formation and facet joint hypertrophy is noted.  IMPRESSION: 1. Cervical degenerative disc disease 2. No acute findings.   Electronically Signed   By: Kerby Moors M.D.   On: 05/31/2014 15:54   Dg Shoulder Right  05/31/2014   CLINICAL DATA:  Shoulder pain  EXAM: RIGHT SHOULDER - 2+ VIEW  COMPARISON:  None.  FINDINGS: Normal alignment no fracture. Mild degenerative change in the [facility] joint.  IMPRESSION: Mild AC degenerative change.  No acute abnormality.    Electronically Signed   By: Franchot Gallo M.D.   On: 05/31/2014 16:01   Ct Angio Chest Pe W/cm &/or Wo Cm  05/10/2014   CLINICAL DATA:  Leukocytosis, fever of unknown origin, short of breath, chest pain and dizziness.  EXAM: CT ANGIOGRAPHY CHEST  CT ABDOMEN AND PELVIS WITH CONTRAST  TECHNIQUE: Multidetector CT imaging of the chest was performed using the standard protocol during bolus administration of intravenous contrast. Multiplanar CT image reconstructions and MIPs were obtained to evaluate  the vascular anatomy. Multidetector CT imaging of the abdomen and pelvis was performed using the standard protocol during bolus administration of intravenous contrast.  CONTRAST:  7mL OMNIPAQUE IOHEXOL 350 MG/ML SOLN  COMPARISON:  80 mL Omnipaque 350  FINDINGS: CTA CHEST FINDINGS  No filling defects within the pulmonary arteries to suggest acute pulmonary embolism. No acute findings aorta great vessels. No pericardial fluid. Esophagus is normal.  Review of the lung parenchyma demonstrates no suspicious pulmonary nodules. No airspace disease. No pleural fluid.  CT ABDOMEN and PELVIS FINDINGS  Abdominal portion was scanned at a delayed time . Therefore it is essentially a noncontrast exam.  Hepatobiliary: No focal hepatic lesion identified on this noncontrast exam. Post cholecystectomy.  Pancreas: There is an mass at the level of the pancreatic head measuring 5.2 x 3.1 x 6.1 cm. This mass is along the medial border of the second portion the duodenum and compressive the lumen of the duodenum duodenum. There is enlarged lymph node adjacent to the mass measuring 19 mm. No pancreatic duct dilatation. There is no biliary duct dilatation.  Spleen: Normal spleen appear  Adrenals/Urinary Tract: Adrenal glands are normal. Delayed imaging of the kidneys demonstrate no obstruction. Ureters and bladder normal.  Stomach/Bowel: Stomach, small bowel and appendix are normal. The colon rectosigmoid colon are normal. There are diverticula  of the sigmoid colon acute inflammation.  Mass in the C-loop of the duodenum described in the pancreatic section.  Vascular/Lymphatic: There is scattered calcification of the abdominal aorta. No retroperitoneal periportal lymphadenopathy. No mesenteric or peritoneal metastasis.  Reproductive: Prostate gland and bladder normal. No pelvic lymphadenopathy.  Other: No free fluid in the abdomen pelvis.  No free air  Musculoskeletal: No aggressive osseous lesion.  Review of the MIP images confirms the above findings.  IMPRESSION: Chest Impression:  No evidence of acute pulmonary embolism.  Abdomen / Pelvis Impression:  1. Mass adjacent to pancreatic head and along the C-loop of the duodenum measuring 6 cm. No pancreatic duct dilatation or biliary duct dilatation. Differential includes pancreatic neoplasm, gastrointestinal stromal tumor, or lymphoma. Recommend endoscopic ultrasound with biopsy of this lesion.  2. Local lymphadenopathy with enlarged peripancreatic lymph node.  3. Liver parenchyma is poorly evaluated on this essentially noncontrast exam.  Findings conveyed toDAWOOD ELGERGAWY on 05/10/2014  at19:17.   Electronically Signed   By: Suzy Bouchard M.D.   On: 05/10/2014 19:18   Ct Abdomen Pelvis W Contrast  05/31/2014   CLINICAL DATA:  Generalized abdominal pain, worsening over past 2 weeks. Newly diagnosed pancreatic carcinoma.  EXAM: CT ABDOMEN AND PELVIS WITH CONTRAST  TECHNIQUE: Multidetector CT imaging of the abdomen and pelvis was performed using the standard protocol following bolus administration of intravenous contrast.  CONTRAST:  171mL OMNIPAQUE IOHEXOL 300 MG/ML SOLN, 46mL OMNIPAQUE IOHEXOL 300 MG/ML SOLN  COMPARISON:  05/12/2014  FINDINGS: Lower Chest: Tiny bilateral pleural effusions and dependent atelectasis.  Hepatobiliary: No masses or other significant abnormality identified. Prior cholecystectomy noted.  Pancreas: 6.4 cm mass involving the pancreatic head and descending duodenum. This is  not significant changed in size compared to previous study. Increased hazy opacity is seen surrounding the duodenum and pancreatic head with small amount of fluid seen in Morison's pouch. This may be due to duodenitis or pancreatitis.  Spleen:  Within normal limits in size and appearance.  Adrenal Glands:  No mass identified.  Kidneys/Urinary Tract: Tiny less than 1 cm bilateral renal calculi are again noted. No evidence of ureteral calculi or hydronephrosis. Bilateral perinephric soft  tissue stranding and small amount of fluid are stable in appearance and nonspecific.  Stomach/Bowel/Peritoneum: No evidence of wall thickening, mass, or obstruction. Colonic diverticulosis noted. No evidence of diverticulitis.  Vascular/Lymphatic: Peripancreatic lymphadenopathy noted, with largest lymph node measuring 1.8 cm on image 40, which is unchanged.  Reproductive:  No mass or other significant abnormality identified.  Other:  None.  Musculoskeletal:  No suspicious bone lesions identified.  IMPRESSION: Stable 6 cm mass involving the pancreatic head and duodenum, consistent with known pancreatic carcinoma. No evidence of obstruction.  Stable mild peripancreatic lymphadenopathy, consistent metastatic disease.  Increased hazy opacity surrounding the pancreatic head and duodenum and small amount of fluid within Morison's pouch. This may be related to pancreatitis or duodenitis.  Bilateral nonobstructive nephrolithiasis and diverticulosis incidentally noted.   Electronically Signed   By: Earle Gell M.D.   On: 05/31/2014 14:36   Ct Abdomen Pelvis W Contrast  05/10/2014   CLINICAL DATA:  Leukocytosis, fever of unknown origin, short of breath, chest pain and dizziness.  EXAM: CT ANGIOGRAPHY CHEST  CT ABDOMEN AND PELVIS WITH CONTRAST  TECHNIQUE: Multidetector CT imaging of the chest was performed using the standard protocol during bolus administration of intravenous contrast. Multiplanar CT image reconstructions and MIPs were  obtained to evaluate the vascular anatomy. Multidetector CT imaging of the abdomen and pelvis was performed using the standard protocol during bolus administration of intravenous contrast.  CONTRAST:  101mL OMNIPAQUE IOHEXOL 350 MG/ML SOLN  COMPARISON:  80 mL Omnipaque 350  FINDINGS: CTA CHEST FINDINGS  No filling defects within the pulmonary arteries to suggest acute pulmonary embolism. No acute findings aorta great vessels. No pericardial fluid. Esophagus is normal.  Review of the lung parenchyma demonstrates no suspicious pulmonary nodules. No airspace disease. No pleural fluid.  CT ABDOMEN and PELVIS FINDINGS  Abdominal portion was scanned at a delayed time . Therefore it is essentially a noncontrast exam.  Hepatobiliary: No focal hepatic lesion identified on this noncontrast exam. Post cholecystectomy.  Pancreas: There is an mass at the level of the pancreatic head measuring 5.2 x 3.1 x 6.1 cm. This mass is along the medial border of the second portion the duodenum and compressive the lumen of the duodenum duodenum. There is enlarged lymph node adjacent to the mass measuring 19 mm. No pancreatic duct dilatation. There is no biliary duct dilatation.  Spleen: Normal spleen appear  Adrenals/Urinary Tract: Adrenal glands are normal. Delayed imaging of the kidneys demonstrate no obstruction. Ureters and bladder normal.  Stomach/Bowel: Stomach, small bowel and appendix are normal. The colon rectosigmoid colon are normal. There are diverticula of the sigmoid colon acute inflammation.  Mass in the C-loop of the duodenum described in the pancreatic section.  Vascular/Lymphatic: There is scattered calcification of the abdominal aorta. No retroperitoneal periportal lymphadenopathy. No mesenteric or peritoneal metastasis.  Reproductive: Prostate gland and bladder normal. No pelvic lymphadenopathy.  Other: No free fluid in the abdomen pelvis.  No free air  Musculoskeletal: No aggressive osseous lesion.  Review of the MIP  images confirms the above findings.  IMPRESSION: Chest Impression:  No evidence of acute pulmonary embolism.  Abdomen / Pelvis Impression:  1. Mass adjacent to pancreatic head and along the C-loop of the duodenum measuring 6 cm. No pancreatic duct dilatation or biliary duct dilatation. Differential includes pancreatic neoplasm, gastrointestinal stromal tumor, or lymphoma. Recommend endoscopic ultrasound with biopsy of this lesion.  2. Local lymphadenopathy with enlarged peripancreatic lymph node.  3. Liver parenchyma is poorly evaluated on this essentially  noncontrast exam.  Findings conveyed toDAWOOD ELGERGAWY on 05/10/2014  at19:17.   Electronically Signed   By: Suzy Bouchard M.D.   On: 05/10/2014 19:18   Nm Pet Image Initial (pi) Skull Base To Thigh  05/29/2014   CLINICAL DATA:  Initial treatment strategy for poorly differentiated carcinoma of unclear primary.  EXAM: NUCLEAR MEDICINE PET SKULL BASE TO THIGH  TECHNIQUE: 15.9 mCi F-18 FDG was injected intravenously. Full-ring PET imaging was performed from the skull base to thigh after the radiotracer. CT data was obtained and used for attenuation correction and anatomic localization.  FASTING BLOOD GLUCOSE:  Value: 181 mg/dl  COMPARISON:  CT 05/10/2014  FINDINGS: NECK  No hypermetabolic lymph nodes in the neck.  CHEST  No hypermetabolic mediastinal or hilar nodes. No suspicious pulmonary nodules on the CT scan.  ABDOMEN/PELVIS  There is again demonstrated a mass along the second portion duodenum adjacent to pancreatic head measuring approximately 6.7 x 5.7 cm not changed in volume compared to prior. This mass is intensely hypermetabolic with SUV equal 94.8. Posterior to the mass there is a small hypermetabolic lymph node measuring 18 mm on image 143.  No evidence of hepatic metastasis. No additional metabolic lesions in the abdomen or pelvis. There is stranding around the left and right kidney.  SKELETON  No focal hypermetabolic activity to suggest skeletal  metastasis.  IMPRESSION: 1. Mass along the C-loop of the duodenum adjacent to pancreatic head is intensely hypermetabolic consistent with carcinoma. 2. Adjacent small peripancreatic hypermetabolic lymph node. 3. No additional hypermetabolic adenopathy or metastatic disease.   Electronically Signed   By: Suzy Bouchard M.D.   On: 05/29/2014 16:17   Ct Biopsy  05/17/2014   INDICATION: History of duodenal mass. Patient underwent nondiagnostic endoscopic biopsy and presents now for CT-guided biopsy of a peripancreatic/duodenal lymph node for tissue diagnostic purposes  EXAM: CT GUIDED BIOPSY OF PERIPANCREATIC/DUODENAL LYMPH NODE  COMPARISON:  CT the abdomen pelvis - 05/10/2014  MEDICATIONS: Fentanyl 50 mcg IV; Versed 1.5 mg IV  ANESTHESIA/SEDATION: Sedation time  20 minutes  CONTRAST:  None  COMPLICATIONS: None immediate  PROCEDURE: Informed consent was obtained from the patient following an explanation of the procedure, risks, benefits and alternatives. A time out was performed prior to the initiation of the procedure.  The patient was positioned right lateral decubitus on the CT table and a limited CT was performed for procedural planning demonstrating unchanged size and appearance of previously identified approximately 2.2 x 1.9 cm enlarged peripancreatic/duodenal lymph node immediately adjacent to the large intraluminal duodenal mass (image 40, series 2). The procedure was planned. The operative site was prepped and draped in the usual sterile fashion. Appropriate trajectory was confirmed with a 22 gauge spinal needle after the adjacent tissues were anesthetized with 1% Lidocaine with epinephrine.  Under intermittent CT guidance, a 17 gauge coaxial needle was advanced adjacent to the peripheral aspect of the lymph node (note, given patient body habitus, the coaxial needle was still approximately 2.3 cm too short for purchase directly within in the enlarged lymph node (representative image 2, series 6).  4 core  needle samples were obtained with an 18 gauge core needle biopsy device while slightly advancing the biopsy device system. The co-axial needle was removed and hemostasis was achieved with manual compression.  A limited postprocedural CT was negative for hemorrhage or additional complication. A dressing was placed. The patient tolerated the procedure well without immediate postprocedural complication.  IMPRESSION: Technically successful CT guided core needle biopsy of dominant  peripancreatic/duodenal lymph node.   Electronically Signed   By: Sandi Mariscal M.D.   On: 05/17/2014 10:55   Dg Chest Port 1 View  05/13/2014   CLINICAL DATA:  Fever, diabetes, hypertension  EXAM: PORTABLE CHEST - 1 VIEW  COMPARISON:  05/09/2014  FINDINGS: Stable cardiomegaly.  Atheromatous aorta.  Lungs are clear. Small left pleural effusion as before. Left arm PICC line to the cavoatrial junction. Regional bones unremarkable.  IMPRESSION: 1. Stable cardiomegaly and small left pleural effusion. 2. Left PICC line to cavoatrial junction.   Electronically Signed   By: Arne Cleveland M.D.   On: 05/13/2014 15:08   Dg Chest Port 1 View  05/09/2014   CLINICAL DATA:  Chest pain  EXAM: PORTABLE CHEST - 1 VIEW  COMPARISON:  05/01/2014  FINDINGS: Cardiac shadow remains enlarged. The lungs are well aerated bilaterally without focal infiltrate or sizable effusion. Mild elevation of the right hemidiaphragm is again seen. No gross soft tissue abnormality is noted.  IMPRESSION: No acute abnormality seen.   Electronically Signed   By: Inez Catalina M.D.   On: 05/09/2014 08:23   Dg Shoulder Left  05/31/2014   CLINICAL DATA:  Pain and weakness  EXAM: LEFT SHOULDER - 2+ VIEW  COMPARISON:  None.  FINDINGS: Frontal, Y scapular, and axillary images were obtained. There is no demonstrable fracture or dislocation. There is osteoarthritic change in the glenohumeral and acromioclavicular joints. No erosive change or intra-articular calcification.  IMPRESSION:  Osteoarthritic change.  No fracture or dislocation.   Electronically Signed   By: Lowella Grip M.D.   On: 05/31/2014 15:53   Dg Abd Acute W/chest  05/31/2014   CLINICAL DATA:  Abdominal pain with nausea.  Pancreatic carcinoma.  EXAM: ACUTE ABDOMEN SERIES (ABDOMEN 2 VIEW & CHEST 1 VIEW)  COMPARISON:  PET-CT May 29, 2014 ; chest radiograph May 17, 2014  FINDINGS: PA chest: There is atelectatic change in the left base. Elsewhere lungs are clear. Heart is borderline enlarged with pulmonary vascularity within normal limits. No adenopathy.  Supine and left lateral decubitus abdomen: There is moderate stool throughout the colon. There is no bowel dilatation or air-fluid level suggesting obstruction. No free air. Surgical clips are present in the right upper quadrant. There is a phlebolith in left pelvis.  IMPRESSION: Unremarkable bowel gas pattern. No obstruction or free air. Left base atelectasis. No lung edema or consolidation.   Electronically Signed   By: Lowella Grip M.D.   On: 05/31/2014 12:22       IMPRESSION:  The patient has a new diagnosis of pancreatic cancer. The patient's presentation and biopsy demonstrated either pancreatic or biliary origin and the patient's presentation has been felt to be most consistent with a pancreatic cancer. This likely represents a T4 N1 M0 tumor. He is seen medical oncology who feels that initial chemoradiation treatment may be appropriate. He is not felt to be a good surgical candidate at this time. If he has a dramatic response to treatment and his performance status improves, then this could of course be reconsidered.  I therefore discussed with the patient a potential course of radiation treatment, likely consisting of 5-1/2 weeks with concurrent chemotherapy. I discussed with the patient the possible benefits as well as possible side effects and risks of this treatment. All of his questions were answered today.   PLAN: The patient is currently  in the ICU as an inpatient, treated for the above symptoms as well as possible sepsis. We will attempt to  have the patient undergo simulation on Monday such that we can proceed with treatment planning.    I spent 30 minutes face to face with the patient and more than 50% of that time was spent in counseling and/or coordination of care.    ________________________________   Jodelle Gross, MD, PhD   **Disclaimer: This note was dictated with voice recognition software. Similar sounding words can inadvertently be transcribed and this note may contain transcription errors which may not have been corrected upon publication of note.** I here

## 2014-06-01 NOTE — Progress Notes (Signed)
Inpatient Diabetes Program Recommendations  AACE/ADA: New Consensus Statement on Inpatient Glycemic Control (2013)  Target Ranges:  Prepandial:   less than 140 mg/dL      Peak postprandial:   less than 180 mg/dL (1-2 hours)      Critically ill patients:  140 - 180 mg/dL     Results for ZAYQUAN, BOGARD (MRN [id number]) as of 06/01/2014 08:30  Ref. Range 05/31/2014 10:54 05/31/2014 17:08 05/31/2014 21:22  Glucose-Capillary Latest Range: 70-99 mg/dL 216 (H) 232 (H) 248 (H)    Results for JAYSTEN, ESSNER (MRN [id number]) as of 06/01/2014 08:30  Ref. Range 06/01/2014 07:30  Glucose Latest Range: 70-99 mg/dL 321 (H)    Results for JAQUIS, PICKLESIMER (MRN [id number]) as of 06/01/2014 08:30  Ref. Range 05/12/2014 02:23 05/31/2014 22:59  Hgb A1c MFr Bld Latest Range: <5.7 % 9.8 (H) 8.8 (H)     Admitted with Sepsis, Abd pain.  History of DM, HTN, Pancreatic cancer, CKD3, CHF.   Home DM Meds: 70/30 insulin- 75 units AM/ 85 units PM       Metformin 500 mg QPM   Current Insulin Orders: 70/30 insulin- 30 units bidwc      Novolog Sensitive SSI    **Doubt accuracy of A1c level given patient's low Hemoglobin level on admission  **Note 70/30 insulin was started last PM with supper.  Glucose elevated to 321 mg/dl this morning    MD- If patient is able to eat adequately at meal times, may want to go ahead and increase 70/30 insulin to 75% of his home dose  70/30- 55 units in the AM with breakfast 70/30- 60 units in the PM with supper  Would also go ahead and increase Novolog SSI to Moderate scale tid ac + HS     Will follow Wyn Quaker RN, MSN, CDE Diabetes Coordinator Inpatient Diabetes Program Team Pager: [phone] (8a-10p)

## 2014-06-01 NOTE — Progress Notes (Signed)
Patient ID: [name], male   DOB: Nov 18, 1940, 73 y.o.   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 Alen Blew), recently hospitalized for C. Diff and still on oral Vancomycin, now presented to [facility] ED with main concern of several days duration of progressively worsening abdominal pain, intermittent in nature and throbbing, worse on the left side and occasionally but not consistently radiating to the right side, 7/10 in severity when present, with no specific alleviating or aggravating factors. This has been associated with nausea and poor oral intake, several lbs weight loss, fatigue, dyspnea on exertion. He denies any diarrhea or constipation, no blood in stool, no specific urinary concerns, no blood in urine, no fevers, chills,no chest pain.   In ED, pt noted to be in mild distress due to pain. VS notable for BP 85/43 mmHg, HR 113 bpm, RR 22 bpm, T 99.69F. Blood work notable for WBC > 30K, Hg 7.9 (repeat Hg 8.5, prior to transfusion). CT abd with ? Duodenitis. TRH asked to admit for further evaluation and management of sepsis of unclear etiology. SDU requested. 2 U PRBC requested.   Assessment and Plan: Active Problems: Sepsis - criteria met on admission with tachycardia, leukocytosis, fever, RR 22, hypotension - possibly related to ? intra-abd source (? Duodenitis on CT abd) - pt is clinically improving, still tachycardic with HR 113 bmp but WBC is trending down significantly and BP is stable - lactic acid is also trending down  - continue broad spectrum ABX Vanc and Aztreonam day #2 - pt is still somewhat volume overloaded on exam so continue to hold IVF - C. Diff by PCR pending, urine and blood cultures pending as well   Acute on chronic blood loss anemia, of  pancreatic cancer, DM - review of records indicate that Hg is ~9 at baseline - pt denies any blood in stool or urine, FOBT is negative  - cancer is always possible source of bleed - repeat HG 12/17 was as low as 6.3, pt has received 2 U PRBC 12/17 and with appropriate increase in Hg 8.6 - gave one dose of Lasix in between PRBC - repeat CBC in AM - may need to call GI in AM (pt sees Dr Benson Norway), depends on stability of Hg and any signs of bleeding  Leukocytosis - certainly worrisome for recurrent C. Diff, PCR pending  - WBC trending down - continuing oral Vanc DM, type II, with complications of anemia and CKD II - A1C 8.8  - continue low dose Lantus for now until oral intake improves - hold Metformin and place on SSI for now  HTN - holding home medical regimen Metoprolol and Lisinopril - please note K is on high end of normal, likely not ACEI induced as pt was taking potassium supplements at home  RUE edema > LUE - awaiting for doppler, r/o blood clot  Mild LLE cellulitis  - from edema - current ABX should cover  HLD - on statin  CKD stage II - Cr is now stable and WNL Pancreatic cancer - notified Dr. 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  Family Communication:  plan of care discussed with the patient Disposition Plan: Home when stable.   IV access:    Peripheral IV Procedures and diagnostic studies:     Ct Abdomen Pelvis W Contrast 05/31/2014 Stable 6 cm mass involving the pancreatic head and duodenum, consistent with known pancreatic carcinoma. No evidence of obstruction. Stable mild peripancreatic lymphadenopathy, consistent metastatic disease. Increased hazy opacity surrounding the pancreatic head and duodenum and small amount of fluid within Morison's pouch. This may be related to pancreatitis or duodenitis. Bilateral nonobstructive nephrolithiasis and diverticulosis incidentally noted.   Nm Pet Image Initial (pi) Skull Base To Thigh 05/29/2014 1. Mass along the C-loop of the duodenum adjacent to pancreatic head is intensely hypermetabolic consistent with carcinoma. 2. Adjacent small peripancreatic hypermetabolic lymph node. 3. No additional hypermetabolic adenopathy or metastatic disease.   Dg Abd Acute W/ches 05/31/2014 Unremarkable bowel gas pattern. No obstruction or free air. Left base atelectasis. No lung edema or consolidation.  Medical Consultants:   None  Other Consultants:   None  IAnti-Infectives:    Vancomycin 12/17 -->  Aztreonam 12/17 -->   Leisa Lenz, MD  [facility] Pager [phone]  If 7PM-7AM, please contact night-coverage [email or web address] Password [facility] 06/01/2014, 9:54 AM   LOS: 1 day    HPI/Subjective: No acute overnight events.  Objective: Filed Vitals:   06/01/14 0500 06/01/14 0600 06/01/14 0800 06/01/14 0832  BP: 132/54 145/62  134/57  Pulse:      Temp:   98.5 F (36.9 C)   TempSrc:   Oral   Resp: 23 23    Height:      Weight:      SpO2: 100% 100%      Intake/Output Summary (Last 24 hours) at 06/01/14 0954 Last data filed at 06/01/14 0600  Gross per 24 hour  Intake   1965 ml  Output   1375 ml  Net    590 ml    Exam:   General:  Pt is alert, follows commands appropriately, not in acute distress  Cardiovascular: Regular rhythm, tachycardic, S1/S2, no  murmurs  Respiratory: Clear to auscultation bilaterally, no wheezing, no crackles, no rhonchi  Abdomen: Soft, non tender, slightly distended, bowel sounds present  Extremities: +1 bilateral LE pitting edema, RUE > LUE edema, pulses DP and PT palpable bilaterally  Neuro: Grossly nonfocal  Data Reviewed: Basic Metabolic Panel:  Recent Labs Lab 05/31/14 1116 05/31/14 1130 06/01/14 0730  NA 130* 134* 133*  K 5.3 5.2 4.8  CL 96 102 99  CO2 20  --  20  GLUCOSE 249* 245* 321*  BUN 30* 29* 27*  CREATININE 1.33 1.30 1.05  CALCIUM 8.4  --  7.9*   Liver Function Tests:  Recent Labs Lab 05/31/14 1116  AST 21  ALT 18  ALKPHOS 65  BILITOT 0.6  PROT 5.8*  ALBUMIN 1.5*    Recent Labs Lab 05/31/14 1116  LIPASE 21   CBC:  Recent Labs Lab 05/31/14 1116 05/31/14 1130  05/31/14 2328 06/01/14 0730  WBC 36.0*  --  17.9* 19.8*  NEUTROABS 30.6*  --   --   --   HGB 7.9* 8.5* 6.3* 8.6*  HCT 24.5* 25.0* 19.4* 26.6*  MCV 94.6  --  93.7 91.1  PLT 314  --  PLATELET CLUMPS NOTED ON SMEAR, COUNT APPEARS ADEQUATE 238   CBG:  Recent Labs Lab 05/29/14 1418 05/31/14 1054 05/31/14 1708 05/31/14 2122 06/01/14 0733  GLUCAP 181* 216* 232* 248* 298*    Recent Results (from the past 240 hour(s))  MRSA PCR Screening     Status: None   Collection Time: 05/31/14  4:25 PM  Result Value Ref Range Status   MRSA by PCR NEGATIVE NEGATIVE Final    Comment:        The GeneXpert MRSA Assay (FDA approved for NASAL specimens only), is one component of a comprehensive MRSA colonization surveillance program. It is not intended to diagnose MRSA infection nor to guide or monitor treatment for MRSA infections.      Scheduled Meds: . aztreonam  1 g Intravenous 3 times per day  . doxazosin  2 mg Oral QAC breakfast  . insulin aspart  0-9 Units Subcutaneous TID WC  . insulin aspart protamine- aspart  30 Units Subcutaneous BID WC  . pantoprazole  40 mg Oral Daily  . vancomycin  1,750 mg  Intravenous Q24H  . vancomycin  125 mg Oral QID   Continuous Infusions:

## 2014-06-01 NOTE — Progress Notes (Signed)
INITIAL NUTRITION ASSESSMENT  DOCUMENTATION CODES Per approved criteria  -Morbid Obesity   INTERVENTION: -Recommend Glucerna Shake po BID, each supplement provides 220 kcal and 10 grams of protein -RD to continue to monitor   NUTRITION DIAGNOSIS: Inadequate oral intake related to abd pain/loose stools as evidenced by PO intake < 75%  Goal: Pt to meet >/= 90% of their estimated nutrition needs    Monitor:  Total protein/energy intake, labs, weights, GI profile  Reason for Assessment: MST  73 y.o. male  Admitting Dx: <principal problem not specified>  ASSESSMENT: Pt is 73 yo very pleasant male with HTN, HLD, DM type II (on insulin and Metformin), pancreatic cancer (follows with Dr. Alen Blew), recently hospitalized for C. Diff and still on oral Vancomycin, now presented to [facility] ED with main concern of several days duration of progressively worsening abdominal pain,  -Pt and wife resting during time of admit; obtained information from RD assessment completed on 12/01 -Pt was eating well during previous admit, > 75% of meals. Was counseled on DM diet and provided with Glucerna shakes BID to assist with meeting increased nutrient needs in chronic illness (pancreatic cancer) -Pt currently with poor PO intake, 0% meal completion charted by RN. Will order Glucerna shakes BID -Has had loose stools; C.diff pending -Pt reported 20 lb wt loss in one month; however previous medical records indicate +8 lb in past one month   Height: Ht Readings from Last 1 Encounters:  05/31/14 6\' 6"  (1.981 m)    Weight: Wt Readings from Last 1 Encounters:  05/31/14 306 lb 14.1 oz (139.2 kg)    Ideal Body Weight: 214 lb  % Ideal Body Weight: 142%  Wt Readings from Last 10 Encounters:  05/31/14 306 lb 14.1 oz (139.2 kg)  05/30/14 303 lb 14.4 oz (137.848 kg)  05/10/14 298 lb (135.172 kg)  01/22/14 311 lb (141.069 kg)  01/13/14 306 lb 6.4 oz (138.982 kg)  01/09/14 312 lb (141.522 kg)  06/27/13 307  lb (139.254 kg)  11/28/12 296 lb (134.265 kg)  10/13/12 292 lb (132.45 kg)  10/05/12 288 lb 12.8 oz (130.999 kg)    Usual Body Weight: 318 lb per previous RD note  % Usual Body Weight: 96%  BMI:  Body mass index is 35.47 kg/(m^2).  Estimated Nutritional Needs: Kcal: 2400-2600 Protein: 115-130 gram Fluid: >/= 2400 ml daily  Skin: WDL  Diet Order: Diet heart healthy/carb modified  EDUCATION NEEDS: -No education needs identified at this time   Intake/Output Summary (Last 24 hours) at 06/01/14 1607 Last data filed at 06/01/14 1155  Gross per 24 hour  Intake   1865 ml  Output   1675 ml  Net    190 ml    Last BM: 12/17; loose   Labs:   Recent Labs Lab 05/31/14 1116 05/31/14 1130 06/01/14 0730  NA 130* 134* 133*  K 5.3 5.2 4.8  CL 96 102 99  CO2 20  --  20  BUN 30* 29* 27*  CREATININE 1.33 1.30 1.05  CALCIUM 8.4  --  7.9*  GLUCOSE 249* 245* 321*    CBG (last 3)   Recent Labs  05/31/14 1708 05/31/14 2122 06/01/14 0733  GLUCAP 232* 248* 298*    Scheduled Meds: . sodium chloride   Intravenous Q72H  . aztreonam  1 g Intravenous 3 times per day  . doxazosin  2 mg Oral QAC breakfast  . fluconazole (DIFLUCAN) IV  100 mg Intravenous Q24H  . insulin aspart  0-15 Units  Subcutaneous TID WC  . insulin aspart protamine- aspart  55 Units Subcutaneous BID WC  . pantoprazole  40 mg Oral Daily  . sodium chloride  10-40 mL Intracatheter Q12H  . vancomycin  125 mg Oral QID  . vancomycin  1,000 mg Intravenous Q12H    Continuous Infusions:   Past Medical History  Diagnosis Date  . Diabetes mellitus   . Hyperlipidemia   . Hypertension   . GERD (gastroesophageal reflux disease)   . Coronary artery disease     a. S/P prior PTCA in the 90's;  b. 04/2007 Cath: LM nl, LAD Ca2+ prox, LCX nl, RI nl, OM nl, RCA 40-50p, 40d, EF 60%;  b. 09/2012 Echo: EF 55-60%, mild LVH, nl wall motion, Gr 1 DD, mildly dil LA.  . Right ureteral stone   . SVT (supraventricular  tachycardia)     a. s/p ablation per Dr. Lovena Le 10 -64yrs ago  . OSA on CPAP     a. cpap setting of 16  . Arthritis   . Bilateral kidney stones   . Shortness of breath dyspnea   . CKD (chronic kidney disease), stage III   . Pancreatic cancer 05/17/14    Pancreas  . Cancer dx Dec 2015    pancreatic  . Allergy     Past Surgical History  Procedure Laterality Date  . Right ureteroscopic stone extraction  09-26-2010  . Circumcision and fulgeration of condyloma  06-22-2003  . Cardiac electrophysiology study and ablation  2002  . Transthoracic echocardiogram  03-03-2011    MODERATE LVH/ LVSF NORMAL / EF 60-65%/ MILDLY DILATED LEFT ATRIUMK  . Severeal ureteroscopic stone extractions    . Ureteroscopy  11/13/2011    Procedure: URETEROSCOPY;  Surgeon: Claybon Jabs, MD;  Location: [facility];  Service: Urology;  Laterality: Right;  RIGHT URETEROSCOPY WITH HOLMIUM LASER LIHTOTRIPSY DIGITAL URETEROSCOPE  . Coronary angioplasty  1994    [place] OF THE LAD  . Cardiac catheterization  04-27-2007    CAD WITH 30% NARROWING IN THE MID-LAD/ 50 % NARROWING PROXIMAL CIRCUMFLEX/ 40% NARROWING SECOND MARGINAL BRANCH/ 40-50% PROXIMAL RCA WITH DISTAL POSTERIOR NARROWING/ NORMAL LVF  . Cardiac catheterization  2000    NON-OBSTRUCTIVE CAD  . Laparoscopic cholecystectomy  03-26-2005  . Total knee arthroplasty  07/18/2012    Procedure: TOTAL KNEE ARTHROPLASTY;  Surgeon: Johnn Hai, MD;  Location: [facility];  Service: Orthopedics;  Laterality: Right;  . Esophagogastroduodenoscopy N/A 05/12/2014    Procedure: ESOPHAGOGASTRODUODENOSCOPY (EGD);  Surgeon: Beryle Beams, MD;  Location: [facility] ENDOSCOPY;  Service: Endoscopy;  Laterality: N/A;    Atlee Abide MS RD LDN Clinical Dietitian [phone]

## 2014-06-01 NOTE — Progress Notes (Signed)
Protection for Vancomycin / Aztreonam Indication: R/O Sepsis  Allergies  Allergen Reactions  . Clindamycin/Lincomycin Other (See Comments)    dysphagia  . Codeine Itching  . Penicillins Rash    Patient Measurements: Height: 6\' 6"  (198.1 cm) Weight: (!) 306 lb 14.1 oz (139.2 kg) IBW/kg (Calculated) : 91.4   Vital Signs: Temp: 97.6 F (36.4 C) (12/18 1200) Temp Source: Oral (12/18 1200) BP: 134/57 mmHg (12/18 0832) Intake/Output from previous day: 12/17 0701 - 12/18 0700 In: 1965 [P.O.:560; I.V.:195; Blood:560; IV Piggyback:650] Out: 1375 [WUJWJ:1914] Intake/Output from this shift: Total I/O In: -  Out: 300 [Urine:300]  Labs:  Recent Labs  05/31/14 1116 05/31/14 1130 05/31/14 2328 06/01/14 0730  WBC 36.0*  --  17.9* 19.8*  HGB 7.9* 8.5* 6.3* 8.6*  PLT 314  --  PLATELET CLUMPS NOTED ON SMEAR, COUNT APPEARS ADEQUATE 238  CREATININE 1.33 1.30  --  1.05   Estimated Creatinine Clearance: 99.4 mL/min (by C-G formula based on Cr of 1.05). No results for input(s): VANCOTROUGH, VANCOPEAK, VANCORANDOM, GENTTROUGH, GENTPEAK, GENTRANDOM, TOBRATROUGH, TOBRAPEAK, TOBRARND, AMIKACINPEAK, AMIKACINTROU, AMIKACIN in the last 72 hours.   Procalcitonin values: 12/17:  0.26 12/18:  0.22    Medical History: Past Medical History  Diagnosis Date  . Diabetes mellitus   . Hyperlipidemia   . Hypertension   . GERD (gastroesophageal reflux disease)   . Coronary artery disease     a. S/P prior PTCA in the 90's;  b. 04/2007 Cath: LM nl, LAD Ca2+ prox, LCX nl, RI nl, OM nl, RCA 40-50p, 40d, EF 60%;  b. 09/2012 Echo: EF 55-60%, mild LVH, nl wall motion, Gr 1 DD, mildly dil LA.  . Right ureteral stone   . SVT (supraventricular tachycardia)     a. s/p ablation per Dr. Lovena Le 10 -25yrs ago  . OSA on CPAP     a. cpap setting of 16  . Arthritis   . Bilateral kidney stones   . Shortness of breath dyspnea   . CKD (chronic kidney disease), stage III   .  Pancreatic cancer 05/17/14    Pancreas  . Cancer dx Dec 2015    pancreatic  . Allergy    Microbiology: 12/17 blood x2: collected 12/17 urine: collected   Anti-infectives: 12/17 >> Vancomycin  >> 12/17 >> Aztreonam >>   12/17 >> Oral vancomycin as PTA (for previous CDAD)>>   Assessment: 8 yoM recently hospitalized and diagnosed with pancreatic cancer with plans to start chemotherapy in January presented 12/17 with increased weakness, pain, and SOB and found to be hypotensive.  PMHx significant for CKD-III, DM, HLD, HTN, and CAD.    Pharmacy consulted to start vancomycin and aztreonam for sepsis. Pt also continued on outpt regimen of oral vancomycin for past C.Diff infection.  Note allergies to penicillins (rash) and clindamycin (dysphagia).   Goal of Therapy:  Vancomycin trough level 15-20 mcg/ml  Appropriate antibiotic dosing for indication and renal function; eradication of infection.    Today, 12/18: D#2 aztreonam 1 gram IV q8h D#2 vancomycin 2.5 grams IV x 1, then 1750 mg IV q24h Afebrile WBC improved Blood and urine cultures pending. SCr improved, estimated normalized CrCl now ~ 64 mL/min/72kg Procalcitonin <0.25 - value not typically suggestive of active systemic infection   Plan:  1. Change vancomycin maintenance dosage to 1000 mg IV q12h based upon improved renal function and revised nomogram that became effective today. 2. Continue present aztreonam dosage. 3. Follow serum creatinine, culture results,  clinical course.   Consider possible de-escalation or discontinuation of antibiotic regimen when appropriate.  Clayburn Pert, PharmD, BCPS Pager: [phone] 06/01/2014  1:09 PM

## 2014-06-01 NOTE — Progress Notes (Addendum)
*  PRELIMINARY RESULTS* Vascular Ultrasound Left upper extremity venous duplex has been completed.  Preliminary findings: No evidence of DVT in the left upper extremity. Superficial thrombosis noted in the left basilic and cephalic veins.  Appears somewhat improved from venous study on 05/16/14.   Landry Mellow, RDMS, RVT  06/01/2014, 10:06 AM

## 2014-06-01 NOTE — Progress Notes (Signed)
Utilization review completed.  

## 2014-06-02 ENCOUNTER — Inpatient Hospital Stay (HOSPITAL_COMMUNITY): Payer: Medicare Other

## 2014-06-02 DIAGNOSIS — A419 Sepsis, unspecified organism: Principal | ICD-10-CM

## 2014-06-02 LAB — GLUCOSE, CAPILLARY
GLUCOSE-CAPILLARY: 216 mg/dL — AB (ref 70–99)
GLUCOSE-CAPILLARY: 258 mg/dL — AB (ref 70–99)
Glucose-Capillary: 272 mg/dL — ABNORMAL HIGH (ref 70–99)
Glucose-Capillary: 162 mg/dL — ABNORMAL HIGH (ref 70–99)

## 2014-06-02 LAB — BASIC METABOLIC PANEL
Anion gap: 13 (ref 5–15)
BUN: 22 mg/dL (ref 6–23)
CO2: 21 meq/L (ref 19–32)
Calcium: 8.2 mg/dL — ABNORMAL LOW (ref 8.4–10.5)
Chloride: 100 mEq/L (ref 96–112)
Creatinine, Ser: 1.03 mg/dL (ref 0.50–1.35)
GFR calc Af Amer: 82 mL/min — ABNORMAL LOW (ref >90)
GFR, EST NON AFRICAN AMERICAN: 71 mL/min — AB (ref >90)
GLUCOSE: 187 mg/dL — AB (ref 70–99)
POTASSIUM: 4.4 meq/L (ref 3.7–5.3)
Sodium: 134 mEq/L — ABNORMAL LOW (ref 137–147)

## 2014-06-02 LAB — CBC
HEMATOCRIT: 27.1 % — AB (ref 39.0–52.0)
HEMOGLOBIN: 9.1 g/dL — AB (ref 13.0–17.0)
MCH: 30.8 pg (ref 26.0–34.0)
MCHC: 33.6 g/dL (ref 30.0–36.0)
MCV: 91.9 fL (ref 78.0–100.0)
Platelets: 257 10*3/uL (ref 150–400)
RBC: 2.95 MIL/uL — ABNORMAL LOW (ref 4.22–5.81)
RDW: 15.9 % — ABNORMAL HIGH (ref 11.5–15.5)
WBC: 28.5 10*3/uL — ABNORMAL HIGH (ref 4.0–10.5)

## 2014-06-02 LAB — PROCALCITONIN: PROCALCITONIN: 0.37 ng/mL

## 2014-06-02 MED ORDER — MAGIC MOUTHWASH W/LIDOCAINE
10.0000 mL | Freq: Four times a day (QID) | ORAL | Status: DC
  Administered 2014-06-02 (×3): 10 mL via ORAL
  Filled 2014-06-02 (×9): qty 10

## 2014-06-02 MED ORDER — FLUCONAZOLE 100MG IVPB: 100.0000 mg | INTRAVENOUS | Status: DC

## 2014-06-02 MED ORDER — FUROSEMIDE 10 MG/ML IJ SOLN
40.0000 mg | Freq: Two times a day (BID) | INTRAMUSCULAR | Status: DC
  Administered 2014-06-02: 40 mg via INTRAVENOUS
  Filled 2014-06-02 (×3): qty 4

## 2014-06-02 MED ORDER — LORAZEPAM 2 MG/ML IJ SOLN
1.0000 mg | Freq: Every evening | INTRAMUSCULAR | Status: DC | PRN
  Administered 2014-06-02: 1 mg via INTRAVENOUS
  Filled 2014-06-02: qty 1

## 2014-06-02 MED ORDER — FUROSEMIDE 10 MG/ML IJ SOLN
40.0000 mg | Freq: Once | INTRAMUSCULAR | Status: AC
  Administered 2014-06-02: 40 mg via INTRAVENOUS
  Filled 2014-06-02: qty 4

## 2014-06-02 MED ORDER — SODIUM CHLORIDE 0.9 % IJ SOLN
10.0000 mL | INTRAMUSCULAR | Status: DC | PRN
  Administered 2014-06-04 – 2014-06-11 (×6): 10 mL
  Filled 2014-06-02 (×6): qty 40

## 2014-06-02 NOTE — Progress Notes (Signed)
Patient ID: [name], male   DOB: [birth year], 73 y.o.   MRN: [id number]  [facility] PROGRESS NOTE  AMIER HOYT NHA:[id number] DOB: [birth year] DOA: 05/31/2014 PCP: [name], MD  Brief narrative:   Pt is 73 yo very pleasant male with HTN, HLD, DM type II (on insulin and Metformin), pancreatic cancer (follows with Dr. Alen Blew), recently hospitalized for C. Diff and still on oral Vancomycin, now presented to [facility] ED with main concern of several days duration of progressively worsening abdominal pain, intermittent in nature and throbbing, worse on the left side and occasionally but not consistently radiating to the right side, 7/10 in severity when present, with no specific alleviating or aggravating factors. This has been associated with nausea and poor oral intake, several lbs weight loss, fatigue, dyspnea on exertion. He denies any diarrhea or constipation, no blood in stool, no specific urinary concerns, no blood in urine, no fevers, chills,no chest pain.   In ED, pt noted to be in mild distress due to pain. VS notable for BP 85/43 mmHg, HR 113 bpm, RR 22 bpm, T 99.61F. Blood work notable for WBC > 30K, Hg 7.9 (repeat Hg 8.5, prior to transfusion). CT abd with ? Duodenitis. TRH asked to admit for further evaluation and management of sepsis of unclear etiology. SDU requested. 2 U PRBC requested.   Assessment and Plan: Active Problems: Sepsis - criteria met on admission with tachycardia, leukocytosis, fever, RR 22, hypotension - possibly related to ? intra-abd source (? Duodenitis on CT abd) - pt is clinically improving, still tachycardic with HR 113 bmp but WBC down since admission, BP is stable - lactic acid is also trending down  - continue broad spectrum ABX Vanc and Aztreonam day #3 - pt is still volume overloaded on exam so continue to hold IVF and will provide Lasix 40 mg IV x 1 dose now 12/19 - C. Diff by PCR negative   Acute on chronic blood loss anemia, of pancreatic  cancer, DM - review of records indicate that Hg is ~9 at baseline - pt denies any blood in stool or urine, FOBT is negative  - cancer is always possible source of bleed - repeat HG 12/17 was as low as 6.3, pt has received 2 U PRBC 12/17 and with appropriate increase in Hg 8.6 - gave one dose of Lasix in between PRBC 20 mg IV dose (12/17) - repeat CBC in AM - may need to call GI (pt sees Dr Benson Norway), depends on stability of Hg and any signs of bleeding  Leukocytosis - unclear etiology and has been somewhat of a challenge since last admission - WBC trending up and down, very unpredictable  - today is the last day for oral Vanc for C. Diff (pt started on Dec 4th, 2015) DM, type II, with complications of anemia and CKD II - A1C 8.8  - continue low dose Lantus for now until oral intake improves - hold Metformin and place on SSI for now  HTN - holding home medical regimen Metoprolol and Lisinopril - will place on Lasix for now and will add home regimen if BP remains stable  RUE edema > LUE - doppler scan negative for DVT  Mild LLE cellulitis  - from edema HLD - on statin  CKD stage II - Cr is now stable and WNL Pancreatic cancer - notified Dr. 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  Family Communication: plan of care discussed with  the patient Disposition Plan: Transfer to telemetry bed   IV access:   Peripheral IV Procedures and diagnostic studies:    Ct Abdomen Pelvis W Contrast 05/31/2014 Stable 6 cm mass involving the pancreatic head and duodenum, consistent with known pancreatic carcinoma. No evidence of obstruction. Stable mild peripancreatic lymphadenopathy, consistent metastatic disease. Increased hazy opacity surrounding the pancreatic head and duodenum and small amount of fluid within Morison's pouch. This may be related to pancreatitis or duodenitis. Bilateral nonobstructive nephrolithiasis and diverticulosis incidentally noted.   Nm Pet Image Initial (pi) Skull Base To Thigh 05/29/2014 1. Mass along the C-loop of the duodenum adjacent to pancreatic head is intensely hypermetabolic consistent with carcinoma. 2. Adjacent small peripancreatic hypermetabolic lymph node. 3. No additional hypermetabolic adenopathy or metastatic disease.   Dg Abd Acute W/ches 05/31/2014 Unremarkable bowel gas pattern. No obstruction or free air. Left base atelectasis. No lung edema or consolidation.  Medical Consultants:   None  Other Consultants:   None  IAnti-Infectives:    Vancomycin 12/17 -->  Aztreonam 12/17 -->   [name], MD  [facility] Pager [phone]  If 7PM-7AM, please contact night-coverage [email or web address] Password [email or web address] 06/02/2014, 11:45 AM   LOS: 2 days   HPI/Subjective: No events overnight.   Objective: Filed Vitals:   06/02/14 0408 06/02/14 0745 06/02/14 0800 06/02/14 1100  BP:   147/49   Pulse:      Temp:  98.3 F (36.8 C)    TempSrc:  Oral    Resp:   20 23  Height:      Weight: 141.2 kg (311 lb 4.6 oz)     SpO2:   100% 94%    Intake/Output Summary (Last 24 hours) at 06/02/14 1145 Last data filed at 06/02/14 6144  Gross per 24 hour  Intake   1270 ml  Output   1625 ml  Net   -355 ml    Exam:   General:  Pt is alert, follows commands appropriately, not  in acute distress  Cardiovascular: Regular rhythm, tachycardic, S1/S2, no murmurs, no rubs, no gallops  Respiratory: Clear to auscultation bilaterally, bibasilar crackles   Abdomen: Soft, non tender,slighlty distended, bowel sounds present, no guarding  Extremities: +2 bilateral LE pitting edema, UE edema, pulses DP and PT palpable bilaterally  Neuro: Grossly nonfocal  Data Reviewed: Basic Metabolic Panel:  Recent Labs Lab 05/31/14 1116 05/31/14 1130 06/01/14 0730 06/02/14 0500  NA 130* 134* 133* 134*  K 5.3 5.2 4.8 4.4  CL 96 102 99 100  CO2 20  --  20 21  GLUCOSE 249* 245* 321* 187*  BUN 30* 29* 27* 22  CREATININE 1.33 1.30 1.05 1.03  CALCIUM 8.4  --  7.9* 8.2*   Liver Function Tests:  Recent Labs Lab 05/31/14 1116  AST 21  ALT 18  ALKPHOS 65  BILITOT 0.6  PROT 5.8*  ALBUMIN 1.5*    Recent Labs Lab 05/31/14 1116  LIPASE 21   CBC:  Recent Labs Lab 05/31/14 1116 05/31/14 1130 05/31/14 2328 06/01/14 0730 06/02/14 0500  WBC 36.0*  --  17.9* 19.8* 28.5*  NEUTROABS 30.6*  --   --   --   --   HGB 7.9* 8.5* 6.3* 8.6* 9.1*  HCT 24.5* 25.0* 19.4* 26.6* 27.1*  MCV 94.6  --  93.7 91.1 91.9  PLT 314  --  PLATELET CLUMPS NOTED ON SMEAR, COUNT APPEARS ADEQUATE 238 257   Cardiac Enzymes: No results for input(s): CKTOTAL, CKMB, CKMBINDEX, TROPONINI in the last 168 hours. BNP: Invalid input(s): POCBNP CBG:  Recent Labs Lab 06/01/14 0733 06/01/14 1153 06/01/14 1637 06/01/14 2132 06/02/14 0813  GLUCAP 298* 268* 288* 207* 216*    Recent Results (from the past 240 hour(s))  MRSA PCR Screening     Status: None   Collection Time: 05/31/14  4:25 PM  Result Value Ref Range Status   MRSA by PCR NEGATIVE NEGATIVE Final    Comment:        The GeneXpert MRSA Assay (FDA approved for NASAL specimens only), is one component of a comprehensive MRSA colonization surveillance program. It is not intended to diagnose MRSA infection nor to guide or monitor  treatment for MRSA infections.   Urine culture     Status: None   Collection Time: 05/31/14  9:15 PM  Result Value Ref Range Status   Specimen Description URINE, RANDOM  Final   Special Requests NONE  Final   Culture  Setup Time   Final    06/01/2014 01:24 Performed at [place]   Final    4,000 COLONIES/ML Performed at [facility]    Culture   Final    INSIGNIFICANT GROWTH Performed at [facility]    Report Status 06/01/2014 FINAL  Final  Clostridium Difficile by PCR     Status: None   Collection Time: 06/01/14  6:20 AM  Result Value Ref Range Status   C difficile by pcr NEGATIVE NEGATIVE Final    Comment: Performed at [facility]     Scheduled Meds: . sodium chloride   Intravenous Q72H  . aztreonam  1 g Intravenous 3 times per day  . doxazosin  2 mg Oral QAC breakfast  . feeding supplement (GLUCERNA SHAKE)  237 mL Oral BID BM  . fluconazole (DIFLUCAN) IV  100 mg Intravenous Q24H  . furosemide  40 mg Intravenous Once  . insulin aspart  0-15 Units Subcutaneous TID WC  . insulin aspart protamine- aspart  55 Units Subcutaneous BID WC  . magic mouthwash w/lidocaine  10 mL Oral QID  . pantoprazole  40 mg Oral Daily  . sodium chloride  10-40 mL Intracatheter Q12H  . vancomycin  125 mg Oral QID  . vancomycin  1,000 mg Intravenous Q12H   Continuous Infusions:

## 2014-06-02 NOTE — Progress Notes (Signed)
Pt is using home CPAP machine, mask and tubing. Pt states he does not need any assistance with machine or mask application. Per wife, sterile water has already been added for humidification. Pt was encouraged to contact RT for assistance if needed. RT will continue to monitor as needed.

## 2014-06-02 NOTE — Progress Notes (Signed)
Pt on CPAP QHS.  Pt stable and comfortable.  Pt is using his home machine with his home tubing and full face mask.  Machine plugged into red outlet.  [employer] (service response) contacted to check out Pt's machine.

## 2014-06-03 DIAGNOSIS — C250 Malignant neoplasm of head of pancreas: Secondary | ICD-10-CM

## 2014-06-03 LAB — BASIC METABOLIC PANEL
Anion gap: 14 (ref 5–15)
BUN: 20 mg/dL (ref 6–23)
CHLORIDE: 96 meq/L (ref 96–112)
CO2: 22 meq/L (ref 19–32)
Calcium: 8.3 mg/dL — ABNORMAL LOW (ref 8.4–10.5)
Creatinine, Ser: 1.08 mg/dL (ref 0.50–1.35)
GFR calc Af Amer: 77 mL/min — ABNORMAL LOW (ref >90)
GFR calc non Af Amer: 67 mL/min — ABNORMAL LOW (ref >90)
GLUCOSE: 192 mg/dL — AB (ref 70–99)
POTASSIUM: 4.4 meq/L (ref 3.7–5.3)
SODIUM: 132 meq/L — AB (ref 137–147)

## 2014-06-03 LAB — CBC
HCT: 27.3 % — ABNORMAL LOW (ref 39.0–52.0)
HEMOGLOBIN: 8.8 g/dL — AB (ref 13.0–17.0)
MCH: 29.4 pg (ref 26.0–34.0)
MCHC: 32.2 g/dL (ref 30.0–36.0)
MCV: 91.3 fL (ref 78.0–100.0)
PLATELETS: 267 10*3/uL (ref 150–400)
RBC: 2.99 MIL/uL — AB (ref 4.22–5.81)
RDW: 15.9 % — ABNORMAL HIGH (ref 11.5–15.5)
WBC: 29.5 10*3/uL — ABNORMAL HIGH (ref 4.0–10.5)

## 2014-06-03 LAB — GLUCOSE, CAPILLARY
Glucose-Capillary: 192 mg/dL — ABNORMAL HIGH (ref 70–99)
Glucose-Capillary: 197 mg/dL — ABNORMAL HIGH (ref 70–99)
Glucose-Capillary: 304 mg/dL — ABNORMAL HIGH (ref 70–99)
Glucose-Capillary: 212 mg/dL — ABNORMAL HIGH (ref 70–99)

## 2014-06-03 LAB — VANCOMYCIN, TROUGH: Vancomycin Tr: 17.4 ug/mL (ref 10.0–20.0)

## 2014-06-03 LAB — MAGNESIUM: MAGNESIUM: 1.6 mg/dL (ref 1.5–2.5)

## 2014-06-03 MED ORDER — FUROSEMIDE 10 MG/ML IJ SOLN
40.0000 mg | Freq: Every day | INTRAMUSCULAR | Status: DC
  Administered 2014-06-04: 40 mg via INTRAVENOUS
  Filled 2014-06-03: qty 4

## 2014-06-03 MED ORDER — MAGIC MOUTHWASH
10.0000 mL | Freq: Four times a day (QID) | ORAL | Status: DC | PRN
  Filled 2014-06-03: qty 10

## 2014-06-03 NOTE — Progress Notes (Signed)
Pt restless and complaining of SOB while wearing home CPAP (16cmH2O). RT bled in 2L of oxygen into CPAP machine. SpO2 96%, HR 107 & breath sounds clear/diminished throughout. Pt is now resting comfortably on CPAP. RT will continue to monitor as needed.

## 2014-06-03 NOTE — Progress Notes (Signed)
Patient ID: [name], male   DOB: 20-Oct-1940, 73 y.o.   MRN: [id number]  [facility] PROGRESS NOTE  [name] GUR:[id number] DOB: 1940/07/23 DOA: 05/31/2014 PCP: [name], MD  Brief narrative:   Pt is 73 yo very pleasant male with HTN, HLD, DM type II (on insulin and Metformin), pancreatic cancer (follows with Dr. Alen Blew), recently hospitalized for C. Diff and still on oral Vancomycin, now presented to [facility] ED with main concern of several days duration of progressively worsening abdominal pain, intermittent in nature and throbbing, worse on the left side and occasionally but not consistently radiating to the right side, 7/10 in severity when present, with no specific alleviating or aggravating factors. This has been associated with nausea and poor oral intake, several lbs weight loss, fatigue, dyspnea on exertion. He denies any diarrhea or constipation, no blood in stool, no specific urinary concerns, no blood in urine, no fevers, chills,no chest pain.   In ED, pt noted to be in mild distress due to pain. VS notable for BP 85/43 mmHg, HR 113 bpm, RR 22 bpm, T 99.65F. Blood work notable for WBC > 30K, Hg 7.9 (repeat Hg 8.5, prior to transfusion). CT abd with ? Duodenitis. TRH asked to admit for further evaluation and management of sepsis of unclear etiology. SDU requested. 2 U PRBC requested.   Assessment and Plan: Active Problems: Sepsis - criteria met on admission with tachycardia, leukocytosis, fever, RR 22, hypotension - possibly related to ? intra-abd source (? Duodenitis on CT abd) - continue broad spectrum ABX Vanc and Aztreonam day #4 - pt is still volume overloaded on exam so continue to hold IVF, started Lasix 40 mg IV QD - C. Diff by PCR negative  Acute on chronic blood loss anemia, of pancreatic cancer, DM - review of records indicate that Hg is ~9 at baseline - pt denies any blood in stool or urine, FOBT is negative  - cancer is possible source of bleed - repeat  Hg 12/17 was as low as 6.3, pt has received 2 U PRBC 12/17 and with appropriate increase in Hg 8.6 - gave one dose of Lasix in between PRBC 20 mg IV dose (12/17) - repeat CBC in AM - may need to call GI (pt sees Dr Benson [place]), depends on stability of Hg and any signs of bleeding  Leukocytosis - unclear etiology and has been somewhat of a challenge since last admission - WBC trending up and down, very unpredictable  - oral Vanc for C. 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  Family Communication: plan of care discussed with the patient Disposition Plan: Remains inpatient   IV access:   Peripheral IV Procedures and diagnostic  studies:    Ct Abdomen Pelvis W Contrast 05/31/2014 Stable 6 cm mass involving the pancreatic head and duodenum, consistent with known pancreatic carcinoma. No evidence of obstruction. Stable mild peripancreatic lymphadenopathy, consistent metastatic disease. Increased hazy opacity surrounding the pancreatic head and duodenum and small amount of fluid within Morison's pouch. This may be related to pancreatitis or duodenitis. Bilateral nonobstructive nephrolithiasis and diverticulosis incidentally noted.   Nm Pet Image Initial (pi) Skull Base To Thigh 05/29/2014 1. Mass along the C-loop of the duodenum adjacent to pancreatic head is intensely hypermetabolic consistent with carcinoma. 2. Adjacent small peripancreatic hypermetabolic lymph node. 3. No additional hypermetabolic adenopathy or metastatic disease.   Dg Abd Acute W/ches 05/31/2014 Unremarkable bowel gas pattern. No obstruction or free air. Left base atelectasis. No lung edema or consolidation.  Medical Consultants:   None  Other Consultants:   None  IAnti-Infectives:    Vancomycin 12/17 -->  Aztreonam 12/17 -->   [name], MD  [facility] Pager [phone]  If 7PM-7AM, please contact night-coverage [email or web address] Password [email or web address] 06/03/2014, 1:43 PM   LOS: 3 days   HPI/Subjective: No events overnight.   Objective: Filed Vitals:   06/03/14 0023 06/03/14 0150 06/03/14 0602 06/03/14 0700  BP:  124/58 134/69   Pulse:   95 84  Temp: 99.7 F (37.6 C) 98.7 F (37.1 C)    TempSrc:  Oral    Resp:   20   Height:      Weight:      SpO2:   100% 97%    Intake/Output Summary (Last 24 hours) at 06/03/14 1343 Last data filed at 06/03/14 1100  Gross per 24 hour  Intake    280 ml  Output    925 ml  Net   -645 ml    Exam:   General:  Pt is alert, follows commands appropriately, not in acute distress  Cardiovascular: Regular rate and rhythm, S1/S2, no rubs, no gallops  Respiratory: Clear to  auscultation bilaterally, no wheezing, diminished breath sounds at bases   Abdomen: Soft, non tender, slightly distended, bowel sounds present, no guarding  Extremities: +2 bilateral LE pitting edema, pulses DP and PT palpable bilaterally  Neuro: Grossly nonfocal  Data Reviewed: Basic Metabolic Panel:  Recent Labs Lab 05/31/14 1116 05/31/14 1130 06/01/14 0730 06/02/14 0500 06/03/14 0156 06/03/14 0157  NA 130* 134* 133* 134* 132*  --   K 5.3 5.2 4.8 4.4 4.4  --   CL 96 102 99 100 96  --   CO2 20  --  $R'20 21 22  'fe$ --   GLUCOSE 249* 245* 321* 187* 192*  --   BUN 30* 29* 27* 22 20  --   CREATININE 1.33 1.30 1.05 1.03 1.08  --   CALCIUM 8.4  --  7.9* 8.2* 8.3*  --   MG  --   --   --   --   --  1.6   Liver Function Tests:  Recent Labs Lab 05/31/14 1116  AST 21  ALT 18  ALKPHOS 65  BILITOT 0.6  PROT 5.8*  ALBUMIN 1.5*    Recent Labs Lab 05/31/14 1116  LIPASE 21   CBC:  Recent Labs Lab 05/31/14 1116 05/31/14 1130 05/31/14 2328 06/01/14 0730 06/02/14 0500 06/03/14 0156  WBC 36.0*  --  17.9* 19.8* 28.5* 29.5*  NEUTROABS 30.6*  --   --   --   --   --   HGB 7.9* 8.5* 6.3* 8.6* 9.1* 8.8*  HCT 24.5* 25.0* 19.4* 26.6* 27.1* 27.3*  MCV 94.6  --  93.7 91.1 91.9 91.3  PLT 314  --  PLATELET CLUMPS NOTED ON SMEAR, COUNT APPEARS ADEQUATE 238 257 267   CBG:  Recent Labs Lab 06/02/14 1222 06/02/14 1704 06/02/14 2143 06/03/14 0726 06/03/14 1140  GLUCAP 272* 258* 162* 192* 197*    Recent Results (from the past 240 hour(s))  MRSA PCR Screening     Status: None   Collection Time: 05/31/14  4:25 PM  Result Value Ref Range Status   MRSA by PCR NEGATIVE NEGATIVE Final    Comment:        The GeneXpert MRSA Assay (FDA approved for NASAL specimens only), is one component of a comprehensive MRSA colonization surveillance program. It is not intended to diagnose MRSA infection nor to guide or monitor treatment for MRSA infections.   Urine culture     Status: None    Collection Time: 05/31/14  9:15 PM  Result Value Ref Range Status   Specimen Description URINE, RANDOM  Final   Special Requests NONE  Final   Culture  Setup Time   Final    06/01/2014 01:24 Performed at [place]   Final    4,000 COLONIES/ML Performed at [facility]    Culture   Final    INSIGNIFICANT GROWTH Performed at [facility]    Report Status 06/01/2014 FINAL  Final  Culture, blood (routine x 2)     Status: None (Preliminary result)   Collection Time: 05/31/14 10:59 PM  Result Value Ref Range Status   Specimen Description BLOOD PICC  Final   Special Requests BOTTLES DRAWN AEROBIC ONLY 3ML  Final   Culture  Setup Time   Final    06/01/2014 04:41 Performed at [facility]    Culture   Final           BLOOD CULTURE RECEIVED NO GROWTH TO DATE CULTURE WILL BE HELD FOR 5 DAYS BEFORE ISSUING A FINAL NEGATIVE REPORT Performed at [facility]    Report Status PENDING  Incomplete  Clostridium Difficile by PCR     Status: None   Collection Time: 06/01/14  6:20 AM  Result Value Ref Range Status   C difficile by pcr NEGATIVE NEGATIVE Final    Comment: Performed at [facility]     Scheduled Meds: . sodium chloride   Intravenous Q72H  . aztreonam  1 g Intravenous 3 times per day  . doxazosin  2 mg Oral QAC breakfast  . feeding supplement (GLUCERNA SHAKE)  237 mL Oral BID BM  . fluconazole (DIFLUCAN) IV  100 mg Intravenous Q24H  . [START ON 06/04/2014] furosemide  40 mg Intravenous Daily  . insulin aspart  0-15 Units Subcutaneous TID WC  . insulin aspart protamine- aspart  55 Units Subcutaneous BID WC  . pantoprazole  40 mg  Oral Daily  . sodium chloride  10-40 mL Intracatheter Q12H  . vancomycin  1,000 mg Intravenous Q12H   Continuous Infusions:

## 2014-06-03 NOTE — Progress Notes (Signed)
Family wound like pt to have PAC prior to d/c from hospital; for treatment to start after holidays.

## 2014-06-03 NOTE — Progress Notes (Signed)
Pt is using home CPAP machine set at 16cmH2O, tubing and full face mask. Pt's wife states she will place her husband on CPAP tonight and add sterile water. RT will continue to monitor as needed.

## 2014-06-03 NOTE — Evaluation (Signed)
Physical Therapy Evaluation Patient Details Name: [name] MRN: [id number] DOB: [birth year] Today's Date: 06/03/2014   History of Present Illness  Pt is 73 yo  male adm 05/31/14 with sepsis of unclear etiology; PMHx:  HTN, HLD, DM, pancreatic cancer  Clinical Impression  Pt admitted with above diagnosis. Pt currently with functional limitations due to the deficits listed below (see PT Problem List).  Pt will benefit from skilled PT to increase their independence and safety with mobility to allow discharge to the venue listed below.  Pt will need HHPT; has DME     Follow Up Recommendations Home health PT;Supervision for mobility/OOB    Equipment Recommendations  None recommended by PT    Recommendations for Other Services       Precautions / Restrictions Precautions Precautions: Fall      Mobility  Bed Mobility Overal bed mobility: Needs Assistance Bed Mobility: Supine to Sit     Supine to sit: Supervision     General bed mobility comments: cues for  self assist  Transfers Overall transfer level: Needs assistance Equipment used: Rolling walker (2 wheeled) Transfers: Sit to/from Stand Sit to Stand: Min assist         General transfer comment: assist for safety, mildly unsteady on his feet   Ambulation/Gait Ambulation/Gait assistance: Min assist Ambulation Distance (Feet): 70 Feet Assistive device: Rolling walker (2 wheeled) Gait Pattern/deviations: Step-through pattern;Wide base of support;Decreased stride length     General Gait Details: cues for RW safety, slow slightly  Stairs            Wheelchair Mobility    Modified Rankin (Stroke Patients Only)       Balance Overall balance assessment: Needs assistance     Sitting balance - Comments: Pt able to sit EOB without support but requires single limb support to weight shift.    Standing balance support: Bilateral upper extremity supported Standing balance-Leahy Scale: Poor Standing  balance comment: pt requires UE support to maintain balance                             Pertinent Vitals/Pain Pain Assessment: No/denies pain    Home Living Family/patient expects to be discharged to:: Private residence Living Arrangements: Spouse/significant other Available Help at Discharge: Family;Available PRN/intermittently Type of Home: House Home Access: Level entry     Home Layout: One level Home Equipment: Walker - 2 wheels;Cane - single point;Wheelchair - manual      Prior Function Level of Independence: Independent;Independent with assistive device(s)         Comments: using RW since Thanksgiving, not prior     Hand Dominance        Extremity/Trunk Assessment   Upper Extremity Assessment: Defer to OT evaluation           Lower Extremity Assessment:  (fatigues quickly)         Communication   Communication: No difficulties  Cognition Arousal/Alertness: Awake/alert Behavior During Therapy: WFL for tasks assessed/performed Overall Cognitive Status: Within Functional Limits for tasks assessed                      General Comments      Exercises        Assessment/Plan    PT Assessment Patient needs continued PT services  PT Diagnosis Abnormality of gait   PT Problem List Decreased activity tolerance;Decreased balance;Decreased mobility;Decreased knowledge of use of DME;Cardiopulmonary status limiting activity  PT Treatment Interventions DME instruction;Gait training;Functional mobility training;Therapeutic activities;Balance training;Therapeutic exercise;Neuromuscular re-education;Patient/family education   PT Goals (Current goals can be found in the Care Plan section) Acute Rehab PT Goals Patient Stated Goal: go home, feel better PT Goal Formulation: With patient/family Time For Goal Achievement: 06/10/14 Potential to Achieve Goals: Good    Frequency Min 3X/week   Barriers to discharge        Co-evaluation                End of Session Equipment Utilized During Treatment: Gait belt Activity Tolerance: Patient tolerated treatment well Patient left: in chair;with call bell/phone within reach;with family/visitor present           Time: 1435-1451 PT Time Calculation (min) (ACUTE ONLY): 16 min   Charges:   PT Evaluation $Initial PT Evaluation Tier I: 1 Procedure PT Treatments $Gait Training: 8-22 mins   PT G Codes:          Maryrose Colvin 06/29/2014, 4:02 PM

## 2014-06-03 NOTE — Progress Notes (Signed)
[place] for Vancomycin / Aztreonam Indication: R/O Sepsis  Allergies  Allergen Reactions  . Clindamycin/Lincomycin Other (See Comments)    dysphagia  . Codeine Itching  . Penicillins Rash    Patient Measurements: Height: 6\' 6"  (198.1 cm) Weight: (!) 311 lb 4.6 oz (141.2 kg) IBW/kg (Calculated) : 91.4  Labs:  Recent Labs  06/01/14 0730 06/02/14 0500 06/03/14 0156  WBC 19.8* 28.5* 29.5*  HGB 8.6* 9.1* 8.8*  PLT 238 257 267  CREATININE 1.05 1.03 1.08   Estimated Creatinine Clearance: 97.3 mL/min (by C-G formula based on Cr of 1.08).  Recent Labs  06/03/14 0650  VANCOTROUGH 17.4     Procalcitonin values: 12/17:  0.26 12/18:  0.22 12/19:  0.37  Microbiology: 12/17 blood x2: NGtd 12/17 urine: insignificant growth 12/18 stool Cdiff: negative   Anti-infectives: 12/4 (PTA) >> Oral vancomycin (for previous CDAD)>>12/19 12/17 >> Vancomycin  >> 12/17 >> Aztreonam >>  12/18 >> fluconazole (MD) >>   Assessment: 7 yoM recently hospitalized and diagnosed with pancreatic cancer with plans to start chemotherapy in January presented 12/17 with increased weakness, pain, and SOB and found to be hypotensive.  PMHx significant for CKD-III, DM, HLD, HTN, and CAD.    Pharmacy consulted to start vancomycin and aztreonam for sepsis. Pt also continued on outpt regimen of oral vancomycin for past C.Diff infection.  Note allergies to penicillins (rash) and clindamycin (dysphagia).   Goal of Therapy:  Vancomycin trough level 15-20 mcg/ml  Appropriate antibiotic dosing for indication and renal function; eradication of infection.    Today, 12/20: D#4 aztreonam D#4 vancomycin D#2 fluconazole (MD) Tmax: 100.8 WBC: elevated and increasing SCr improved- stable, estimated normalized CrCl now ~ 63 mL/min/72kg   Plan:  1. Continue vancomycin 1000 mg IV q12h 2. Continue present aztreonam dosage. 3. Follow serum creatinine, culture results,  clinical course.   Consider possible de-escalation or discontinuation of antibiotic regimen when appropriate.  Thank you for the consult.  Currie Paris, PharmD, BCPS Pager: [phone] Pharmacy: [phone] 06/03/2014 7:47 AM

## 2014-06-04 ENCOUNTER — Ambulatory Visit
Admit: 2014-06-04 | Discharge: 2014-06-04 | Disposition: A | Discharge status: Home / Self Care | Payer: Medicare Other | Attending: Radiation Oncology | Admitting: Radiation Oncology

## 2014-06-04 ENCOUNTER — Telehealth: Payer: Self-pay | Admitting: *Deleted

## 2014-06-04 ENCOUNTER — Other Ambulatory Visit: Payer: Medicare Other

## 2014-06-04 DIAGNOSIS — C250 Malignant neoplasm of head of pancreas: Secondary | ICD-10-CM

## 2014-06-04 LAB — CBC WITH DIFFERENTIAL/PLATELET
BASOS ABS: 0.1 10*3/uL (ref 0.0–0.1)
BASOS PCT: 0 % (ref 0–1)
EOS PCT: 0 % (ref 0–5)
Eosinophils Absolute: 0.1 10*3/uL (ref 0.0–0.7)
HEMATOCRIT: 26.1 % — AB (ref 39.0–52.0)
Hemoglobin: 8.3 g/dL — ABNORMAL LOW (ref 13.0–17.0)
Lymphocytes Relative: 9 % — ABNORMAL LOW (ref 12–46)
Lymphs Abs: 2.3 10*3/uL (ref 0.7–4.0)
MCH: 29.5 pg (ref 26.0–34.0)
MCHC: 31.8 g/dL (ref 30.0–36.0)
MCV: 92.9 fL (ref 78.0–100.0)
MONO ABS: 2.3 10*3/uL — AB (ref 0.1–1.0)
MONOS PCT: 9 % (ref 3–12)
Neutro Abs: 20.3 10*3/uL — ABNORMAL HIGH (ref 1.7–7.7)
Neutrophils Relative %: 82 % — ABNORMAL HIGH (ref 43–77)
Platelets: 286 10*3/uL (ref 150–400)
RBC: 2.81 MIL/uL — ABNORMAL LOW (ref 4.22–5.81)
RDW: 16.2 % — ABNORMAL HIGH (ref 11.5–15.5)
WBC: 25 10*3/uL — ABNORMAL HIGH (ref 4.0–10.5)

## 2014-06-04 LAB — BASIC METABOLIC PANEL
ANION GAP: 12 (ref 5–15)
BUN: 22 mg/dL (ref 6–23)
CALCIUM: 8.5 mg/dL (ref 8.4–10.5)
CO2: 23 meq/L (ref 19–32)
CREATININE: 1.07 mg/dL (ref 0.50–1.35)
Chloride: 96 mEq/L (ref 96–112)
GFR calc Af Amer: 78 mL/min — ABNORMAL LOW (ref >90)
GFR calc non Af Amer: 67 mL/min — ABNORMAL LOW (ref >90)
Glucose, Bld: 187 mg/dL — ABNORMAL HIGH (ref 70–99)
Potassium: 4.7 mEq/L (ref 3.7–5.3)
Sodium: 131 mEq/L — ABNORMAL LOW (ref 137–147)

## 2014-06-04 LAB — TYPE AND SCREEN
ABO/RH(D): O POS
Antibody Screen: NEGATIVE
Unit division: 0
Unit division: 0

## 2014-06-04 LAB — GLUCOSE, CAPILLARY
GLUCOSE-CAPILLARY: 197 mg/dL — AB (ref 70–99)
GLUCOSE-CAPILLARY: 224 mg/dL — AB (ref 70–99)
Glucose-Capillary: 255 mg/dL — ABNORMAL HIGH (ref 70–99)
Glucose-Capillary: 248 mg/dL — ABNORMAL HIGH (ref 70–99)

## 2014-06-04 MED ORDER — CIPROFLOXACIN HCL 500 MG PO TABS
500.0000 mg | ORAL_TABLET | Freq: Two times a day (BID) | ORAL | Status: DC
  Administered 2014-06-04 – 2014-06-07 (×6): 500 mg via ORAL
  Filled 2014-06-04 (×8): qty 1

## 2014-06-04 MED ORDER — FUROSEMIDE 10 MG/ML IJ SOLN
40.0000 mg | Freq: Two times a day (BID) | INTRAMUSCULAR | Status: DC
  Administered 2014-06-04 – 2014-06-07 (×6): 40 mg via INTRAVENOUS
  Filled 2014-06-04 (×8): qty 4

## 2014-06-04 NOTE — Evaluation (Signed)
Occupational Therapy Evaluation Patient Details Name: [name] MRN: [id number] DOB: 09-29-1940 Today's Date: 06/04/2014    History of Present Illness Pt is 73 yo  male adm 05/31/14 with sepsis of unclear etiology; PMHx:  HTN, HLD, DM, pancreatic cancer   Clinical Impression   Prior to admission to recent hospital admission, pt was independent in ADL.  He was discharged and remained home approximately one week and then was readmitted.  HHOT had commenced at home.  Pt presents with generalized weakness and impaired balance interfering with ability to perform ADL and ADL transfers.  Will follow acutely.      Follow Up Recommendations  Home health OT;Supervision/Assistance - 24 hour    Equipment Recommendations  None recommended by OT    Recommendations for Other Services       Precautions / Restrictions Precautions Precautions: Fall Restrictions Weight Bearing Restrictions: No      Mobility Bed Mobility               General bed mobility comments: pt received up in chair  Transfers Overall transfer level: Needs assistance Equipment used: Rolling walker (2 wheeled) Transfers: Sit to/from Stand Sit to Stand: Min assist         General transfer comment: assist to power up, pt uses momentum, required 3 attempts from recliner and 2 from [facility], verbal cues for hand placement    Balance                                            ADL Overall ADL's : Needs assistance/impaired Eating/Feeding: Independent;Sitting   Grooming: Wash/dry hands;Wash/dry face;Set up;Sitting   Upper Body Bathing: Set up;Sitting   Lower Body Bathing: Minimal assistance;Sit to/from stand   Upper Body Dressing : Set up;Sitting   Lower Body Dressing: Sit to/from stand;Minimal assistance   Toilet Transfer: Minimal assistance;Ambulation;RW;BSC   Toileting- Clothing Manipulation and Hygiene: Minimal assistance;Sit to/from stand         General ADL Comments: Pt is  able to cross his foot over opposite knee to donn socks.  Pt uses momentum for sit to stand. Gave pt energy conservation handout.     Vision                     Perception     Praxis      Pertinent Vitals/Pain Pain Assessment: No/denies pain     Hand Dominance Right   Extremity/Trunk Assessment Upper Extremity Assessment Upper Extremity Assessment: Generalized weakness (edematous UEs B)   Lower Extremity Assessment Lower Extremity Assessment: Defer to PT evaluation   Cervical / Trunk Assessment Cervical / Trunk Assessment: Normal   Communication Communication Communication: No difficulties   Cognition Arousal/Alertness: Awake/alert Behavior During Therapy: WFL for tasks assessed/performed Overall Cognitive Status: Within Functional Limits for tasks assessed                     General Comments       Exercises       Shoulder Instructions      Home Living Family/patient expects to be discharged to:: Private residence Living Arrangements: Spouse/significant other Available Help at Discharge: Family;Available 24 hours/day (for next 2 weeks) Type of Home: House Home Access: Level entry     Home Layout: One level     Bathroom Shower/Tub: Teacher, early years/pre: Standard  Home Equipment: [place] - 2 wheels;Cane - single point;Wheelchair - [employer];Shower seat          Prior Functioning/Environment Level of Independence: Independent;Independent with assistive device(s)        Comments: using RW since Thanksgiving, not prior    OT Diagnosis: Generalized weakness   OT Problem List: Decreased strength;Decreased activity tolerance;Impaired balance (sitting and/or standing);Decreased knowledge of use of DME or AE;Obesity;Increased edema   OT Treatment/Interventions: Self-care/ADL training;Energy conservation;Therapeutic activities;Patient/family education;Balance training;DME and/or AE instruction;Therapeutic  exercise    OT Goals(Current goals can be found in the care plan section) Acute Rehab OT Goals Patient Stated Goal: go home, feel better OT Goal Formulation: With patient Time For Goal Achievement: 06/18/14 Potential to Achieve Goals: Good  OT Frequency: Min 2X/week   Barriers to D/C:            Co-evaluation              End of Session Equipment Utilized During Treatment: Rolling walker  Activity Tolerance: Patient limited by fatigue Patient left: in chair;with call bell/phone within reach;with family/visitor present   Time: 4765-4650 OT Time Calculation (min): 28 min Charges:  OT General Charges $OT Visit: 1 Procedure OT Evaluation $Initial OT Evaluation Tier I: 1 Procedure OT Treatments $Self Care/Home Management : 8-22 mins G-Codes:    Malka So 06/04/2014, 10:43 AM [phone]

## 2014-06-04 NOTE — Telephone Encounter (Signed)
Called the floor for patient in [phone],spoke with RN gwen, patient is scheduled for Ct Simulation  At 2pm today, and will be here on flat hard table approximately 1 hour, please medicate for pain if needed. Rn gave verbal understanding and will call me at [phone] if any changes 10:11 AM

## 2014-06-04 NOTE — Progress Notes (Signed)
Patient ID: NAMEER SUMMER, male   DOB: May 13, 1941, 73 y.o.   MRN: [id number]  [facility] PROGRESS NOTE  BHARAT ANTILLON INO:[id number] DOB: 1940-12-15 DOA: 05/31/2014 PCP: [name], MD  Brief narrative:   Pt is 73 yo very pleasant male with HTN, HLD, DM type II (on insulin and Metformin), pancreatic cancer (follows with Dr. Alen Blew), recently hospitalized for C. Diff and still on oral Vancomycin, now presented to [facility] ED with main concern of several days duration of progressively worsening abdominal pain, intermittent in nature and throbbing, worse on the left side and occasionally but not consistently radiating to the right side, 7/10 in severity when present, with no specific alleviating or aggravating factors. This has been associated with nausea and poor oral intake, several lbs weight loss, fatigue, dyspnea on exertion. He denies any diarrhea or constipation, no blood in stool, no specific urinary concerns, no blood in urine, no fevers, chills,no chest pain.   In ED, pt noted to be in mild distress due to pain. VS notable for BP 85/43 mmHg, HR 113 bpm, RR 22 bpm, T 99.19F. Blood work notable for WBC > 30K, Hg 7.9 (repeat Hg 8.5, prior to transfusion). CT abd with ? Duodenitis. TRH asked to admit for further evaluation and management of sepsis of unclear etiology. SDU requested. 2 U PRBC requested.   Assessment and Plan: Active Problems: Sepsis - criteria met on admission with tachycardia, leukocytosis, fever, RR 22, hypotension - possibly related to ? intra-abd source (? Duodenitis on CT abd) - continue broad spectrum ABX Vanc and Aztreonam day #5, will transition to oral ABX this afternoon  - pt is still volume overloaded on exam so continue to hold IVF, started Lasix 40 mg IV QD but no significant diuresis so will change to BID - C. Diff by PCR negative  Acute on chronic blood loss anemia, of pancreatic cancer, DM - review of records indicate that Hg is ~9 at baseline - pt denies  any blood in stool or urine, FOBT is negative  - cancer is possible source of bleed - repeat Hg 12/17 was as low as 6.3, pt has received 2 U PRBC 12/17 and with appropriate increase in Hg 8.6 - gave one dose of Lasix in between PRBC 20 mg IV dose (12/17) - repeat CBC in AM - IR consulted for port-a-cath placement for chemo in future  Leukocytosis - unclear etiology and has been somewhat of a challenge since last admission - WBC trending up and down, very unpredictable  - oral Vanc for C. Diff (pt started on Dec 4th, 2015), completed Dec 19th, 2015 DM, type II, with complications of anemia and CKD II - A1C 8.8  - continue low dose Lantus for now until oral intake improves - hold Metformin and place on SSI for now  HTN - holding home medical regimen Metoprolol and Lisinopril - will place on Lasix for now and will add home regimen if BP remains stable  RUE edema > LUE, LE edema bilateral  - doppler scan negative for DVT  - continue Lasix as noted above  Mild LLE cellulitis  - from edema HLD - on statin  CKD stage II - Cr is now stable and WNL Pancreatic cancer - notified Dr. 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  Family  Communication: plan of care discussed with the patient Disposition Plan: Remains inpatient   IV access:   Peripheral IV Procedures and diagnostic studies:    Ct Abdomen Pelvis W Contrast 05/31/2014 Stable 6 cm mass involving the pancreatic head and duodenum, consistent with known pancreatic carcinoma. No evidence of obstruction. Stable mild peripancreatic lymphadenopathy, consistent metastatic disease. Increased hazy opacity surrounding the pancreatic head and duodenum and small amount of fluid within Morison's pouch. This may be related to pancreatitis or duodenitis. Bilateral nonobstructive nephrolithiasis and diverticulosis incidentally noted.   Nm Pet Image Initial (pi) Skull Base To Thigh 05/29/2014 1. Mass along the C-loop of the duodenum adjacent to pancreatic head is intensely hypermetabolic consistent with carcinoma. 2. Adjacent small peripancreatic hypermetabolic lymph node. 3. No additional hypermetabolic adenopathy or metastatic disease.   Dg Abd Acute W/ches 05/31/2014 Unremarkable bowel gas pattern. No obstruction or free air. Left base atelectasis. No lung edema or consolidation.  Medical Consultants:   None  Other Consultants:   None  IAnti-Infectives:    Vancomycin 12/17 -->  Aztreonam 12/17 -->  [name], MD  [facility] Pager [phone]  If 7PM-7AM, please contact night-coverage [email or web address] Password [facility] 06/04/2014, 4:43 PM   LOS: 4 days   HPI/Subjective: No events overnight.   Objective: Filed Vitals:   06/03/14 0700 06/03/14 1436 06/03/14 2115 06/04/14 0534  BP:  137/47 124/58 133/51  Pulse: 84 92 95 91  Temp:  99.5 F (37.5 C) 99 F (37.2 C) 98.2 F (36.8 C)  TempSrc:  Oral Oral Oral  Resp:  $Remo'18 18 18  'HGnJN$ Height:      Weight:      SpO2: 97% 96% 99% 96%    Intake/Output Summary (Last 24 hours) at 06/04/14 1643 Last data filed at 06/04/14 1526  Gross per 24 hour  Intake    240 ml  Output    400 ml   Net   -160 ml    Exam:   General:  Pt is alert, follows commands appropriately, not in acute distress  Cardiovascular: Regular rate and rhythm, S1/S2, no rubs, no gallops  Respiratory: Clear to auscultation bilaterally, no wheezing, no crackles, no rhonchi  Abdomen: Soft, non tender, slightly distended, bowel sounds present, no guarding  Extremities: +2 lower and upper extremity pitting edema, pulses DP and PT palpable bilaterally  Neuro: Grossly nonfocal  Data Reviewed: Basic Metabolic Panel:  Recent Labs Lab 05/31/14 1116 05/31/14 1130 06/01/14 0730 06/02/14 0500 06/03/14 0156 06/03/14 0157 06/04/14 0417  NA 130* 134* 133* 134* 132*  --  131*  K 5.3 5.2 4.8 4.4 4.4  --  4.7  CL 96 102 99 100 96  --  96  CO2 20  --  $R'20 21 22  'jV$ --  23  GLUCOSE 249* 245* 321* 187* 192*  --  187*  BUN 30* 29* 27* 22 20  --  22  CREATININE 1.33 1.30 1.05 1.03 1.08  --  1.07  CALCIUM 8.4  --  7.9* 8.2* 8.3*  --  8.5  MG  --   --   --   --   --  1.6  --    Liver Function Tests:  Recent Labs Lab 05/31/14 1116  AST 21  ALT 18  ALKPHOS 65  BILITOT 0.6  PROT 5.8*  ALBUMIN 1.5*    Recent Labs Lab 05/31/14 1116  LIPASE 21   CBC:  Recent Labs Lab 05/31/14 1116  05/31/14 2328 06/01/14 0730 06/02/14 0500 06/03/14 0156 06/04/14 0417  WBC 36.0*  --  17.9* 19.8* 28.5* 29.5* 25.0*  NEUTROABS 30.6*  --   --   --   --   --  20.3*  HGB 7.9*  < > 6.3* 8.6* 9.1* 8.8* 8.3*  HCT 24.5*  < > 19.4* 26.6* 27.1* 27.3* 26.1*  MCV 94.6  --  93.7 91.1 91.9 91.3 92.9  PLT 314  --  PLATELET CLUMPS NOTED ON SMEAR, COUNT APPEARS ADEQUATE 238 257 267 286  < > = values in this interval not displayed. Cardiac Enzymes: No results for input(s): CKTOTAL, CKMB, CKMBINDEX, TROPONINI in the last 168 hours. BNP: Invalid input(s): POCBNP CBG:  Recent Labs Lab 06/03/14 1140 06/03/14 1620 06/03/14 2114 06/04/14 0731 06/04/14 1202  GLUCAP 197* 304* 212* 197* 255*    Recent Results (from the  past 240 hour(s))  MRSA PCR Screening     Status: None   Collection Time: 05/31/14  4:25 PM  Result Value Ref Range Status   MRSA by PCR NEGATIVE NEGATIVE Final    Comment:        The GeneXpert MRSA Assay (FDA approved for NASAL specimens only), is one component of a comprehensive MRSA colonization surveillance program. It is not intended to diagnose MRSA infection nor to guide or monitor treatment for MRSA infections.   Urine culture     Status: None   Collection Time: 05/31/14  9:15 PM  Result Value Ref Range Status   Specimen Description URINE, RANDOM  Final   Special Requests NONE  Final   Culture  Setup Time   Final    06/01/2014 01:24 Performed at [place]   Final    4,000 COLONIES/ML Performed at [facility]    Culture   Final    INSIGNIFICANT GROWTH Performed at [facility]    Report Status 06/01/2014 FINAL  Final  Culture, blood (routine x 2)     Status: None (Preliminary result)   Collection Time: 05/31/14 10:59 PM  Result Value Ref Range Status   Specimen Description BLOOD PICC  Final   Special Requests BOTTLES DRAWN AEROBIC ONLY 3ML  Final   Culture  Setup Time   Final    06/01/2014 04:41 Performed at [facility]    Culture   Final           BLOOD CULTURE RECEIVED NO GROWTH TO DATE CULTURE WILL BE HELD FOR 5 DAYS BEFORE ISSUING A FINAL NEGATIVE REPORT Performed at [facility]    Report Status PENDING  Incomplete  Clostridium Difficile by PCR     Status: None   Collection Time: 06/01/14  6:20 AM  Result Value Ref Range Status   C difficile by pcr NEGATIVE NEGATIVE Final    Comment: Performed at [facility]     Scheduled Meds: . sodium chloride  Intravenous Q72H  . aztreonam  1 g Intravenous 3 times per day  . doxazosin  2 mg Oral QAC breakfast  . feeding supplement (GLUCERNA SHAKE)  237 mL Oral BID BM  . fluconazole (DIFLUCAN) IV  100 mg Intravenous Q24H  . furosemide  40  mg Intravenous BID  . insulin aspart  0-15 Units Subcutaneous TID WC  . insulin aspart protamine- aspart  55 Units Subcutaneous BID WC  . pantoprazole  40 mg Oral Daily  . sodium chloride  10-40 mL Intracatheter Q12H  . vancomycin  1,000 mg Intravenous Q12H   Continuous Infusions:

## 2014-06-04 NOTE — Progress Notes (Signed)
CARE MANAGEMENT NOTE 06/04/2014  Patient:  [name], [name]   Account Number:  [id number]  Date Initiated:  06/04/2014  Documentation initiated by:  Edwyna Shell  Subjective/Objective Assessment:   73 yo male admitted with sepsis and anemia from home with spouse     Action/Plan:   discharge planning   Anticipated DC Date:  06/06/2014   Anticipated DC Plan:  [place]  CM consult      Choice offered to / List presented to:             Status of service:  In process, will continue to follow Medicare Important Message given?   (If response is "NO", the following Medicare IM given date fields will be blank) Date Medicare IM given:   Medicare IM given by:   Date Additional Medicare IM given:   Additional Medicare IM given by:    Discharge Disposition:  [place]  Per UR Regulation:    If discussed at Long Length of Stay Meetings, dates discussed:    Comments:  06/04/14 Edwyna Shell RN BSN CM [phone] Patient and spouse stated that patient has been followed by [facility] PT with [facility]. They stated that he started using a walker prior to this admit, has a BSC and a wc for longer distances and appointments. Patient stated that he would like to continue with [facility] PT is appropriate at time of discharge.

## 2014-06-04 NOTE — Progress Notes (Signed)
Inpatient Diabetes Program Recommendations  AACE/ADA: New Consensus Statement on Inpatient Glycemic Control (2013)  Target Ranges:  Prepandial:   less than 140 mg/dL      Peak postprandial:   less than 180 mg/dL (1-2 hours)      Critically ill patients:  140 - 180 mg/dL   Reason for Visit: Hyperglycemia Results for [name], [name] (MRN [id number]) as of 06/04/2014 12:44  Ref. Range 06/03/2014 07:26 06/03/2014 11:40 06/03/2014 16:20 06/03/2014 21:14 06/04/2014 07:31 06/04/2014 12:02  Glucose-Capillary Latest Range: 70-99 mg/dL 192 (H) 197 (H) 304 (H) 212 (H) 197 (H) 255 (H)   May benefit from adjustments to 70/30 insulin. Recommendations: Increase 70/30 to 60 units bid.  Will continue to follow. Thank you. Lorenda Peck, RD, LDN, CDE Inpatient Diabetes Coordinator [phone]

## 2014-06-05 ENCOUNTER — Other Ambulatory Visit: Payer: Medicare Other

## 2014-06-05 DIAGNOSIS — R601 Generalized edema: Secondary | ICD-10-CM

## 2014-06-05 LAB — BASIC METABOLIC PANEL
Anion gap: 8 (ref 5–15)
BUN: 24 mg/dL — ABNORMAL HIGH (ref 6–23)
CALCIUM: 7.8 mg/dL — AB (ref 8.4–10.5)
CO2: 24 mmol/L (ref 19–32)
Chloride: 98 mEq/L (ref 96–112)
Creatinine, Ser: 1.07 mg/dL (ref 0.50–1.35)
GFR calc Af Amer: 78 mL/min — ABNORMAL LOW (ref >90)
GFR calc non Af Amer: 67 mL/min — ABNORMAL LOW (ref >90)
GLUCOSE: 222 mg/dL — AB (ref 70–99)
POTASSIUM: 4.6 mmol/L (ref 3.5–5.1)
Sodium: 130 mmol/L — ABNORMAL LOW (ref 135–145)

## 2014-06-05 LAB — GLUCOSE, CAPILLARY
GLUCOSE-CAPILLARY: 305 mg/dL — AB (ref 70–99)
Glucose-Capillary: 208 mg/dL — ABNORMAL HIGH (ref 70–99)
Glucose-Capillary: 250 mg/dL — ABNORMAL HIGH (ref 70–99)

## 2014-06-05 LAB — CBC
HCT: 25.2 % — ABNORMAL LOW (ref 39.0–52.0)
HEMOGLOBIN: 8.1 g/dL — AB (ref 13.0–17.0)
MCH: 29.5 pg (ref 26.0–34.0)
MCHC: 32.1 g/dL (ref 30.0–36.0)
MCV: 91.6 fL (ref 78.0–100.0)
PLATELETS: 266 10*3/uL (ref 150–400)
RBC: 2.75 MIL/uL — AB (ref 4.22–5.81)
RDW: 15.9 % — ABNORMAL HIGH (ref 11.5–15.5)
WBC: 17.9 10*3/uL — ABNORMAL HIGH (ref 4.0–10.5)

## 2014-06-05 MED ORDER — DIPHENHYDRAMINE HCL 50 MG PO CAPS
50.0000 mg | ORAL_CAPSULE | Freq: Once | ORAL | Status: AC
  Administered 2014-06-05: 50 mg via ORAL
  Filled 2014-06-05: qty 1

## 2014-06-05 MED ORDER — FLUCONAZOLE 100 MG PO TABS
100.0000 mg | ORAL_TABLET | Freq: Every day | ORAL | Status: DC
  Administered 2014-06-05 – 2014-06-07 (×3): 100 mg via ORAL
  Filled 2014-06-05 (×3): qty 1

## 2014-06-05 NOTE — Progress Notes (Signed)
Inpatient Diabetes Program Recommendations  AACE/ADA: New Consensus Statement on Inpatient Glycemic Control (2013)  Target Ranges:  Prepandial:   less than 140 mg/dL      Peak postprandial:   less than 180 mg/dL (1-2 hours)      Critically ill patients:  140 - 180 mg/dL   Reason for Visit: Hyperglycemia  Results for [name], [name] (MRN [id number]) as of 06/05/2014 12:35  Ref. Range 06/04/2014 12:02 06/04/2014 16:49 06/04/2014 22:05 06/05/2014 07:35 06/05/2014 12:21  Glucose-Capillary Latest Range: 70-99 mg/dL 255 (H) 224 (H) 248 (H) 208 (H) 305 (H)   Blood sugars continue in 200-300s. Need increase in 70/30 insulin.  Consider increasing 70/30 to 60 units bid. Increase Novolog to resistant tidwc and hs.  Will continue to follow. Thank you. Lorenda Peck, RD, LDN, CDE Inpatient Diabetes Coordinator [phone]

## 2014-06-05 NOTE — Progress Notes (Signed)
Physical Therapy Treatment Patient Details Name: [name] MRN: [id number] DOB: 1941-02-17 Today's Date: 06/05/2014    History of Present Illness Pt is 73 yo  male adm 05/31/14 with sepsis of unclear etiology; PMHx:  HTN, HLD, DM, pancreatic cancer    PT Comments    Assisted pt OOB to amb a short distance in hallway.  Limited activity tolerance due to weakness and max c/o fatigue.  Pt states his R knee gives way.  Follow Up Recommendations  Home health PT;Supervision for mobility/OOB     Equipment Recommendations  None recommended by PT    Recommendations for Other Services       Precautions / Restrictions Precautions Precautions: Fall Restrictions Weight Bearing Restrictions: No    Mobility  Bed Mobility Overal bed mobility: Needs Assistance Bed Mobility: Supine to Sit     Supine to sit: Min guard     General bed mobility comments: assist with supine to sit pt was unable to proper attempt log roll due to BMI  Transfers Overall transfer level: Needs assistance Equipment used: Rolling walker (2 wheeled) Transfers: Sit to/from Stand Sit to Stand: Min assist         General transfer comment: assist to power up as pt uses momentum vs pushing up from bed  Ambulation/Gait Ambulation/Gait assistance: Min assist Ambulation Distance (Feet): 75 Feet Assistive device: Rolling walker (2 wheeled) Judie Petit) Gait Pattern/deviations: Step-to pattern;Step-through pattern;Wide base of support Gait velocity: decreased   General Gait Details: cues for RW safety, slow slightly   Stairs            Wheelchair Mobility    Modified Rankin (Stroke Patients Only)       Balance                                    Cognition                            Exercises      General Comments        Pertinent Vitals/Pain Pain Assessment: 0-10 Pain Location: sore ABD area Pain Descriptors / Indicators: Sore;Tender Pain Intervention(s):  Monitored during session;Repositioned    Home Living                      Prior Function            PT Goals (current goals can now be found in the care plan section) Progress towards PT goals: Progressing toward goals    Frequency  Min 3X/week    PT Plan      Co-evaluation             End of Session Equipment Utilized During Treatment: Gait belt Activity Tolerance: Patient tolerated treatment well Patient left: in bed;with call bell/phone within reach;with family/visitor present     Time: 7893-8101 PT Time Calculation (min) (ACUTE ONLY): 14 min  Charges:  $Gait Training: 8-22 mins                    G Codes:      Rica Koyanagi  PTA [facility]  Acute  Rehab Pager      [phone]

## 2014-06-05 NOTE — Progress Notes (Signed)
Events noted. Patient known to me after my initial consultation with him on 05/30/2014. He was admitted for presumed sepsis, dizziness and failure to thrive. He appears to be improving at this time but certainly not quite ready for any treatment.  I agree with the current management with intravenous antibiotics and supportive measures. I agree with starting the Port-A-Cath while inpatient if possible once sepsis symptoms resolved.   We'll arrange follow-up for him upon his discharge.

## 2014-06-05 NOTE — Progress Notes (Signed)
Pt. Has already placed himself on his home CPAP with his FFM from home. Pt.'s CPAP is set on 16cm H2O. Pt. Is tolerating CPAP well at this time without any complications.

## 2014-06-05 NOTE — Progress Notes (Addendum)
Patient ID: [name], male   DOB: [birth year], 73 y.o.   MRN: [id number]  [facility] PROGRESS NOTE  [name] WIO:[id number] DOB: [birth year] DOA: 05/31/2014 PCP: [name], MD  Brief narrative:   Pt is 73 yo very pleasant male with HTN, HLD, DM type II (on insulin and Metformin), pancreatic cancer (follows with Dr. Alen Blew), recently hospitalized for C. Diff and still on oral Vancomycin, now presented to [facility] ED with main concern of several days duration of progressively worsening abdominal pain, intermittent in nature and throbbing, worse on the left side and occasionally but not consistently radiating to the right side, 7/10 in severity when present, with no specific alleviating or aggravating factors. This has been associated with nausea and poor oral intake, several lbs weight loss, fatigue, dyspnea on exertion. He denies any diarrhea or constipation, no blood in stool, no specific urinary concerns, no blood in urine, no fevers, chills,no chest pain.   In ED, pt noted to be in mild distress due to pain. VS notable for BP 85/43 mmHg, HR 113 bpm, RR 22 bpm, T 99.62F. Blood work notable for WBC > 30K, Hg 7.9 (repeat Hg 8.5, prior to transfusion). CT abd with ? Duodenitis. [facility] asked to admit for further evaluation and management of sepsis of unclear etiology. SDU requested. 2 U PRBC requested.   Assessment and Plan: Active Problems: Sepsis - criteria met on admission with tachycardia, leukocytosis, fever, RR 22, hypotension - possibly related to ? intra-abd source (? Duodenitis on CT abd) - continued broad spectrum ABX Vanc and Aztreonam for 5 days, transitioned to Cipro 12/21 and will continue  - pt is still volume overloaded on exam but improving, weight trending down from 311 --> 303 lbs this AM - started Lasix 40 mg IV QD but no significant diuresis accomplished so lasix changed to 40 mg BID 12/20 - C. Diff by PCR negative  Diastolic CHF, acute on chronic  - with  significant lower and upper extremity pitting edema - in the setting of hypoalbuminemia - gave one dose of Lasix 20 mg IV as noted above between PRBC (12/17) - weight on admission 306 lbs --> up to 311 lbs - placed on Lasix 40 mg IV QD 12/18 and increased frequency to BID on 12/20 to improve diuresis  - monitor weight, I's and O's - will ask cardiology for assistance  Acute on chronic blood loss anemia, of pancreatic cancer, DM - review of records indicate that Hg is ~9 at baseline - pt denies any blood in stool or urine, FOBT is negative  - cancer is possible source of bleed - repeat Hg 12/17 was as low as 6.3, pt has received 2 U PRBC 12/17 and with appropriate increase in Hg 8.6 - gave one dose of Lasix in between PRBC 20 mg IV dose (12/17) - Hg now remains stable with no signs of active bleeding  - IR consulted for port-a-cath placement but this will be on hold until infection resolves Leukocytosis - unclear etiology and has been somewhat of a challenge since last admission - WBC trending up and down, very unpredictable  - oral Vanc for C. Diff (pt started on Dec 4th, 2015), completed Dec 19th, 2015 - continue Cipro as noted above  DM, type II, with complications of anemia and CKD II - A1C 8.8  - continue low dose Lantus for now until oral intake improves - hold Metformin and place on SSI for now  HTN - holding home medical regimen  Metoprolol and Lisinopril - continuing Lasix in an attempt to improve diuresis  RUE edema > LUE, LE edema bilateral  - doppler scan negative for DVT  - continue Lasix as noted above  - will ask cardiology team for assistance as pt explains in the past when Lasix was increased, he had acute kidney failure  HLD - on statin  CKD stage II - Cr is now stable and WNL Pancreatic cancer - appreciate Dr. 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  Family Communication: plan of care discussed with the patient Disposition Plan: Remains inpatient   IV access:   Peripheral IV Procedures and diagnostic studies:    Ct Abdomen Pelvis W Contrast 05/31/2014 Stable 6 cm mass involving the pancreatic head and duodenum, consistent with known pancreatic carcinoma. No evidence of obstruction. Stable mild peripancreatic lymphadenopathy, consistent metastatic disease. Increased hazy opacity surrounding the pancreatic head and duodenum and small amount of fluid within Morison's pouch. This may be related to pancreatitis or duodenitis. Bilateral nonobstructive nephrolithiasis and diverticulosis incidentally noted.   Nm Pet Image Initial (pi) Skull Base To Thigh 05/29/2014 1. Mass along the C-loop of the duodenum adjacent to pancreatic head is intensely hypermetabolic consistent with carcinoma. 2. Adjacent small peripancreatic hypermetabolic lymph node. 3. No additional hypermetabolic adenopathy or metastatic disease.   Dg Abd Acute W/ches 05/31/2014 Unremarkable bowel gas pattern. No obstruction or free air. Left base atelectasis. No lung edema or consolidation.  Medical Consultants:   Oncology Dr. Alen Blew   IR for port-a-cath placement  Cardiology for management of anasarca  Other Consultants:   None  IAnti-Infectives:    Vancomycin 12/17 --> 12/21  Aztreonam 12/17 --> 12/21  Cipro 12/21 -->   [name], MD  [facility] Pager [phone]  If 7PM-7AM, please contact night-coverage [email or web address] Password [facility] 06/05/2014, 4:56 PM   LOS: 5 days   HPI/Subjective: No events overnight.   Objective: Filed Vitals:   06/04/14 2220 06/05/14 0548  06/05/14 0858 06/05/14 1449  BP:  128/62  163/55  Pulse: 93 86  92  Temp:  98.9 F (37.2 C)  98.7 F (37.1 C)  TempSrc:  Oral  Oral  Resp: $Remo'20 18  24  'pOeCm$ Height:      Weight:   137.485 kg (303 lb 1.6 oz)   SpO2: 94% 96%  97%    Intake/Output Summary (Last 24 hours) at 06/05/14 1656 Last data filed at 06/05/14 7169  Gross per 24 hour  Intake    480 ml  Output    100 ml  Net    380 ml    Exam:   General:  Pt is alert, follows commands appropriately, not in acute distress  Cardiovascular: Regular rate and rhythm,  no rubs, no gallops  Respiratory: Clear to auscultation bilaterally, no wheezing, no crackles, no rhonchi  Abdomen: Soft, non tender, non distended, bowel sounds present, no guarding  Extremities: upper and lower extremity pitting edema +2, pulses DP and PT palpable bilaterally  Neuro: Grossly nonfocal  Data Reviewed: Basic Metabolic Panel:  Recent Labs Lab 06/01/14 0730 06/02/14 0500 06/03/14 0156 06/03/14 0157 06/04/14 0417 06/05/14 0440  NA  133* 134* 132*  --  131* 130*  K 4.8 4.4 4.4  --  4.7 4.6  CL 99 100 96  --  96 98  CO2 $Re'20 21 22  'JMW$ --  23 24  GLUCOSE 321* 187* 192*  --  187* 222*  BUN 27* 22 20  --  22 24*  CREATININE 1.05 1.03 1.08  --  1.07 1.07  CALCIUM 7.9* 8.2* 8.3*  --  8.5 7.8*  MG  --   --   --  1.6  --   --    Liver Function Tests:  Recent Labs Lab 05/31/14 1116  AST 21  ALT 18  ALKPHOS 65  BILITOT 0.6  PROT 5.8*  ALBUMIN 1.5*    Recent Labs Lab 05/31/14 1116  LIPASE 21   CBC:  Recent Labs Lab 05/31/14 1116  06/01/14 0730 06/02/14 0500 06/03/14 0156 06/04/14 0417 06/05/14 0440  WBC 36.0*  < > 19.8* 28.5* 29.5* 25.0* 17.9*  NEUTROABS 30.6*  --   --   --   --  20.3*  --   HGB 7.9*  < > 8.6* 9.1* 8.8* 8.3* 8.1*  HCT 24.5*  < > 26.6* 27.1* 27.3* 26.1* 25.2*  MCV 94.6  < > 91.1 91.9 91.3 92.9 91.6  PLT 314  < > 238 257 267 286 266  < > = values in this interval not displayed.  CBG:  Recent Labs Lab  06/04/14 1649 06/04/14 2205 06/05/14 0735 06/05/14 1221 06/05/14 1650  GLUCAP 224* 248* 208* 305* 250*    Recent Results (from the past 240 hour(s))  MRSA PCR Screening     Status: None   Collection Time: 05/31/14  4:25 PM  Result Value Ref Range Status   MRSA by PCR NEGATIVE NEGATIVE Final    Comment:        The GeneXpert MRSA Assay (FDA approved for NASAL specimens only), is one component of a comprehensive MRSA colonization surveillance program. It is not intended to diagnose MRSA infection nor to guide or monitor treatment for MRSA infections.   Urine culture     Status: None   Collection Time: 05/31/14  9:15 PM  Result Value Ref Range Status   Specimen Description URINE, RANDOM  Final   Special Requests NONE  Final   Culture  Setup Time   Final    06/01/2014 01:24 Performed at [facility]   Final    4,000 COLONIES/ML Performed at [facility]    Culture   Final    INSIGNIFICANT GROWTH Performed at [facility]    Report Status 06/01/2014 FINAL  Final  Culture, blood (routine x 2)     Status: None (Preliminary result)   Collection Time: 05/31/14 10:59 PM  Result Value Ref Range Status   Specimen Description BLOOD PICC  Final   Special Requests BOTTLES DRAWN AEROBIC ONLY 3ML  Final   Culture  Setup Time   Final    06/01/2014 04:41 Performed at [facility]    Culture   Final           BLOOD CULTURE RECEIVED NO GROWTH TO DATE CULTURE WILL BE HELD FOR 5 DAYS BEFORE ISSUING A FINAL NEGATIVE REPORT Performed at [facility]    Report Status PENDING  Incomplete  Clostridium Difficile by PCR     Status: None   Collection Time: 06/01/14  6:20 AM  Result Value Ref Range Status   C difficile  by pcr NEGATIVE NEGATIVE Final    Comment: Performed at [facility]     Scheduled Meds: . sodium chloride   Intravenous Q72H  . ciprofloxacin  500 mg Oral BID  . doxazosin  2 mg Oral QAC breakfast  .  feeding supplement (GLUCERNA SHAKE)  237 mL Oral BID BM  . fluconazole  100 mg Oral Daily  . furosemide  40 mg Intravenous BID  . insulin aspart  0-15 Units Subcutaneous TID WC  . insulin aspart protamine- aspart  55 Units Subcutaneous BID WC  . pantoprazole  40 mg Oral Daily  . sodium chloride  10-40 mL Intracatheter Q12H   Continuous Infusions:

## 2014-06-05 NOTE — Progress Notes (Signed)
IR PA aware of request for port a catheter placement, patient admitted with possible sepsis. The patient will need to be afebrile, wbc wnl and no source of active infection prior to port placement.  Tsosie Billing PA-C Interventional Radiology  06/05/14  7:30 AM

## 2014-06-05 NOTE — Progress Notes (Signed)
CARE MANAGEMENT NOTE 06/05/2014  Patient:  [name], [name]   Account Number:  [id number]  Date Initiated:  06/05/2014  Documentation initiated by:  Edwyna Shell  Subjective/Objective Assessment:   73 yo male admitted with sepsis from home     Action/Plan:   discharge planning   Anticipated DC Date:  06/07/2014   Anticipated DC Plan:  [place]  CM consult      Choice offered to / List presented to:             Status of service:  In process, will continue to follow Medicare Important Message given?  YES (If response is "NO", the following Medicare IM given date fields will be blank) Date Medicare IM given:  06/05/2014 Medicare IM given by:  Edwyna Shell Date Additional Medicare IM given:   Additional Medicare IM given by:    Discharge Disposition:  [place]  Per UR Regulation:    If discussed at Long Length of Stay Meetings, dates discussed:    Comments:  06/05/14 Edwyna Shell RN BSN CM [phone] Patient lives at home with spouse and has a walker, wc, and BSC. He had HH PT with [facility] prior to this admission and patient stated that he would like to remain with [facility] for [facility] PT services upon dc. [facility] liason, Colletta [place] made aware of patient status and message left for MD for [facility] PT is appropriate upon dc. Will continue to follow.

## 2014-06-05 NOTE — Consult Note (Signed)
CARDIOLOGY CONSULT NOTE   Patient ID: [name] MRN: [id number], DOB/AGE: Dec 18, 1940   Admit date: 05/31/2014 Date of Consult: 06/05/2014   Primary Physician: [name], MD Primary Cardiologist: Dr. Angelena Form  Pt. Profile  73 year old obese male with history of hypertension, hyperlipidemia, diabetes, CAD status post remote PCI with rotational atherectomy of LAD, diastolic heart failure, SVT status post ablation by Dr. Lovena Le, and recently diagnosed pancreatic cancer since November 2015 present with weakness and diffuse swelling.   Problem List  Past Medical History  Diagnosis Date  . Diabetes mellitus   . Hyperlipidemia   . Hypertension   . GERD (gastroesophageal reflux disease)   . Coronary artery disease     a. S/P prior PTCA in the 90's;  b. 04/2007 Cath: LM nl, LAD Ca2+ prox, LCX nl, RI nl, OM nl, RCA 40-50p, 40d, EF 60%;  b. 09/2012 Echo: EF 55-60%, mild LVH, nl wall motion, Gr 1 DD, mildly dil LA.  . Right ureteral stone   . SVT (supraventricular tachycardia)     a. s/p ablation per Dr. Lovena Le 10 -35yrs ago  . OSA on CPAP     a. cpap setting of 16  . Arthritis   . Bilateral kidney stones   . Shortness of breath dyspnea   . CKD (chronic kidney disease), stage III   . Pancreatic cancer 05/17/14    Pancreas  . Cancer dx Dec 2015    pancreatic  . Allergy     Past Surgical History  Procedure Laterality Date  . Right ureteroscopic stone extraction  09-26-2010  . Circumcision and fulgeration of condyloma  06-22-2003  . Cardiac electrophysiology study and ablation  2002  . Transthoracic echocardiogram  03-03-2011    MODERATE LVH/ LVSF NORMAL / EF 60-65%/ MILDLY DILATED LEFT ATRIUMK  . Severeal ureteroscopic stone extractions    . Ureteroscopy  11/13/2011    Procedure: URETEROSCOPY;  Surgeon: Claybon Jabs, MD;  Location: [facility];  Service: Urology;  Laterality: Right;  RIGHT URETEROSCOPY WITH HOLMIUM LASER LIHTOTRIPSY DIGITAL URETEROSCOPE  .  Coronary angioplasty  1994    [place] OF THE LAD  . Cardiac catheterization  04-27-2007    CAD WITH 30% NARROWING IN THE MID-LAD/ 50 % NARROWING PROXIMAL CIRCUMFLEX/ 40% NARROWING SECOND MARGINAL BRANCH/ 40-50% PROXIMAL RCA WITH DISTAL POSTERIOR NARROWING/ NORMAL LVF  . Cardiac catheterization  2000    NON-OBSTRUCTIVE CAD  . Laparoscopic cholecystectomy  03-26-2005  . Total knee arthroplasty  07/18/2012    Procedure: TOTAL KNEE ARTHROPLASTY;  Surgeon: Johnn Hai, MD;  Location: [facility];  Service: Orthopedics;  Laterality: Right;  . Esophagogastroduodenoscopy N/A 05/12/2014    Procedure: ESOPHAGOGASTRODUODENOSCOPY (EGD);  Surgeon: Beryle Beams, MD;  Location: [facility]COPY;  Service: Endoscopy;  Laterality: N/A;     Allergies  Allergies  Allergen Reactions  . Clindamycin/Lincomycin Other (See Comments)    dysphagia  . Codeine Itching  . Penicillins Rash    HPI   The patient is a 73 year old obese male with history of hypertension, hyperlipidemia, diabetes, CAD status post remote PCI with rotational atherectomy of LAD, diastolic heart failure, SVT status post ablation by Dr. Lovena Le, and recently diagnosed pancreatic cancer since November 2015. His last cardiac cath was in 05/05/2007 which showed 30% mid LAD, 50% proximal left circumflex, 20% OM 2, 40-50% proximal RCA, EF 60%. His last cardiology follow up with Dr. Angelena Form was on 01/22/2014. It appears his Lasix was increased in July for volume excess, unfortunately he was  admitted the end of July for acute kidney injury when his creatinine increased to 2.15 after aggressive diuresis. His Lasix has since been scaled back. He was seen by endocrinologist for blood glucose of greater than 400. He also noted some significant weight loss. Per his wife, patient was admitted in November for palpitation and weakness with orthostatic hypertension. He was noted to be tachycardic at the time. It was felt his chest discomfort at the time was related to  tachycardia. Echocardiogram was obtained on 05/10/2014 which showed EF 60-65%, no regional wall motion abnormality. During the same admission, he was found to have C. Difficile infection placed on vancomycin and pancreatic mass. He underwent a CT-guided biopsy which showed poorly differentiated carcinoma favoring pancreatic or biliary in origin. He underwent a PET scan on 05/29/2014 which showed a mass along C-loop of duodenum adjacent to the pancreatic head consistent with carcinoma, mass measuring 6.7 x 5.7 cm.  Since his discharge, his wife has noted some increase in peripheral edema. According to his wife, the patient's primary care physician increased his Lasix to 80 mg in the morning and 60 mg in the afternoon for 2 days before returning back to 40 mg daily. His edema began slightly better however returned shortly after. According to the patient, he has been compliant with daily Lasix. He presented to [facility] on 05/31/2014 with weakness and failure to thrive. He also has some abdominal pain on arrival, however has since resolved. Per internal medicine, they suspected sepsis given fever, hypertension on arrival. Lactic acid was 2.3. Urine culture is currently pending. Urinalysis is negative for nitrite or leukocytes. Blood culture obtained during initial admission has been negative so far. He was also noted to be anemic on arrival with hemoglobin 6.3. He was given blood transfusion. He was noted to have diffuse anasarca, and is currently receiving IV Lasix. Cardiology has been consulted for acute on chronic diastolic heart failure.  Interestingly, his albumin on arrival was 1.5. And his upper extremity swelling is mainly on the right side. He has bilateral upper extremity venous duplex on December 2 which showed no DVT in right upper extremity or left upper extremity, however does have superficial thrombosis of left basilic and bilateral cephalic vein. Upper extremity venous duplex was repeated  on the left side on 12/18 which continued to show no DVT however does have superficial things thrombosis which appeared to be improved compared to previous study. Per patient, the swelling started before he received a right sided PICC line.  Inpatient Medications  . sodium chloride   Intravenous Q72H  . ciprofloxacin  500 mg Oral BID  . doxazosin  2 mg Oral QAC breakfast  . feeding supplement (GLUCERNA SHAKE)  237 mL Oral BID BM  . fluconazole  100 mg Oral Daily  . furosemide  40 mg Intravenous BID  . insulin aspart  0-15 Units Subcutaneous TID WC  . insulin aspart protamine- aspart  55 Units Subcutaneous BID WC  . pantoprazole  40 mg Oral Daily  . sodium chloride  10-40 mL Intracatheter Q12H    Family History Family History  Problem Relation Age of Onset  . Heart attack Mother   . Heart attack Brother   . Colon cancer Neg Hx   . Stomach cancer Neg Hx      Social History History   Social History  . Marital Status: Married    Spouse Name: N/A    Number of Children: 13  . Years of Education: N/A  Occupational History  . landscape     owner business   Social History Main Topics  . Smoking status: Former Smoker -- 1.00 packs/day for 15 years    Types: Cigarettes    Quit date: 06/15/1986  . Smokeless tobacco: Never Used  . Alcohol Use: Yes     Comment: occasional  . Drug Use: No  . Sexual Activity: Not on file   Other Topics Concern  . Not on file   Social History Narrative     Review of Systems  General:  No chills, fever, night sweats or weight changes.  Cardiovascular:  No chest pain, dyspnea on exertion, orthopnea, palpitations, paroxysmal nocturnal dyspnea. +edema, no recent orthopnea or PND Dermatological: No rash, lesions/masses Respiratory: No cough, dyspnea Urologic: No hematuria, dysuria Abdominal:   No nausea, vomiting, diarrhea, bright red blood per rectum, melena, or hematemesis Abdominal pain, currently resolved Neurologic:  No visual changes,  changes in mental status. wkns All other systems reviewed and are otherwise negative except as noted above.  Physical Exam  Blood pressure 163/55, pulse 92, temperature 98.7 F (37.1 C), temperature source Oral, resp. rate 24, height 6\' 6"  (1.981 m), weight 303 lb 1.6 oz (137.485 kg), SpO2 97 %.  General: Pleasant, NAD Psych: Normal affect. Neuro: Alert and oriented X 3. Moves all extremities spontaneously. HEENT: Normal  Neck: Supple without bruits or JVD. Lungs:  Resp regular and unlabored. mildly reduced breath sound, however no obvious rale Heart: RRR no s3, s4, or murmurs. Abdomen: Soft, non-tender, non-distended, BS + x 4.  Extremities: No clubbing, cyanosis. DP/PT/Radials 2+ and equal bilaterally. Diffuse anasarca with 2+ pitting edema in bilateral LE, upper extremity edema worse on R side (which appear to be the dependent side when he sleeps)  Labs  No results for input(s): CKTOTAL, CKMB, TROPONINI in the last 72 hours. Lab Results  Component Value Date   WBC 17.9* 06/05/2014   HGB 8.1* 06/05/2014   HCT 25.2* 06/05/2014   MCV 91.6 06/05/2014   PLT 266 06/05/2014    Recent Labs Lab 05/31/14 1116  06/05/14 0440  NA 130*  < > 130*  K 5.3  < > 4.6  CL 96  < > 98  CO2 20  < > 24  BUN 30*  < > 24*  CREATININE 1.33  < > 1.07  CALCIUM 8.4  < > 7.8*  PROT 5.8*  --   --   BILITOT 0.6  --   --   ALKPHOS 65  --   --   ALT 18  --   --   AST 21  --   --   GLUCOSE 249*  < > 222*  < > = values in this interval not displayed. Lab Results  Component Value Date   CHOL 141 12/08/2007   HDL 32.3* 12/08/2007   LDLCALC 34 05/25/2007   TRIG 332* 12/08/2007   No results found for: DDIMER  Radiology/Studies  Dg Chest 2 View  05/17/2014   CLINICAL DATA:  Shortness of breath and fever. History of hypertension and diabetes.  EXAM: CHEST  2 VIEW  COMPARISON:  05/13/2014; 05/09/2014; chest CT - 05/10/2014  FINDINGS: Grossly unchanged enlarged cardiac silhouette and mediastinal  contours with minimal atherosclerotic plaque within the thoracic aorta. The lungs remain hyperexpanded. There is blunting of the bilateral costophrenic angles suggestive of trace bilateral pleural effusions. Unchanged bibasilar heterogeneous opacities, left greater than right, likely atelectasis. No new focal airspace opacities. No evidence of edema. No pneumothorax.  Unchanged bones.  IMPRESSION: 1. Cardiomegaly and suspected trace bilateral effusions without evidence of edema. 2. Unchanged bibasilar opacities, left greater than right, favored to represent atelectasis. No discrete focal airspace opacities to suggest pneumonia.   Electronically Signed   By: Sandi Mariscal M.D.   On: 05/17/2014 09:41   Dg Cervical Spine Complete  05/31/2014   CLINICAL DATA:  Weakness for 2 weeks. Intermittent pain in neck and upper back pain.  EXAM: CERVICAL SPINE  4+ VIEWS  COMPARISON:  None.  FINDINGS: Straightening of normal cervical lordosis. There is multi level disc space narrowing and ventral endplate spurring compatible with degenerative disc disease. The prevertebral soft tissue space is normal. Bilateral neuroforaminal narrowing due to posterior spur formation and facet joint hypertrophy is noted.  IMPRESSION: 1. Cervical degenerative disc disease 2. No acute findings.   Electronically Signed   By: Kerby Moors M.D.   On: 05/31/2014 15:54   Dg Shoulder Right  05/31/2014   CLINICAL DATA:  Shoulder pain  EXAM: RIGHT SHOULDER - 2+ VIEW  COMPARISON:  None.  FINDINGS: Normal alignment no fracture. Mild degenerative change in the [facility] joint.  IMPRESSION: Mild AC degenerative change.  No acute abnormality.   Electronically Signed   By: Franchot Gallo M.D.   On: 05/31/2014 16:01   Ct Angio Chest Pe W/cm &/or Wo Cm  05/10/2014   CLINICAL DATA:  Leukocytosis, fever of unknown origin, short of breath, chest pain and dizziness.  EXAM: CT ANGIOGRAPHY CHEST  CT ABDOMEN AND PELVIS WITH CONTRAST  TECHNIQUE: Multidetector CT imaging  of the chest was performed using the standard protocol during bolus administration of intravenous contrast. Multiplanar CT image reconstructions and MIPs were obtained to evaluate the vascular anatomy. Multidetector CT imaging of the abdomen and pelvis was performed using the standard protocol during bolus administration of intravenous contrast.  CONTRAST:  37mL OMNIPAQUE IOHEXOL 350 MG/ML SOLN  COMPARISON:  80 mL Omnipaque 350  FINDINGS: CTA CHEST FINDINGS  No filling defects within the pulmonary arteries to suggest acute pulmonary embolism. No acute findings aorta great vessels. No pericardial fluid. Esophagus is normal.  Review of the lung parenchyma demonstrates no suspicious pulmonary nodules. No airspace disease. No pleural fluid.  CT ABDOMEN and PELVIS FINDINGS  Abdominal portion was scanned at a delayed time . Therefore it is essentially a noncontrast exam.  Hepatobiliary: No focal hepatic lesion identified on this noncontrast exam. Post cholecystectomy.  Pancreas: There is an mass at the level of the pancreatic head measuring 5.2 x 3.1 x 6.1 cm. This mass is along the medial border of the second portion the duodenum and compressive the lumen of the duodenum duodenum. There is enlarged lymph node adjacent to the mass measuring 19 mm. No pancreatic duct dilatation. There is no biliary duct dilatation.  Spleen: Normal spleen appear  Adrenals/Urinary Tract: Adrenal glands are normal. Delayed imaging of the kidneys demonstrate no obstruction. Ureters and bladder normal.  Stomach/Bowel: Stomach, small bowel and appendix are normal. The colon rectosigmoid colon are normal. There are diverticula of the sigmoid colon acute inflammation.  Mass in the C-loop of the duodenum described in the pancreatic section.  Vascular/Lymphatic: There is scattered calcification of the abdominal aorta. No retroperitoneal periportal lymphadenopathy. No mesenteric or peritoneal metastasis.  Reproductive: Prostate gland and bladder  normal. No pelvic lymphadenopathy.  Other: No free fluid in the abdomen pelvis.  No free air  Musculoskeletal: No aggressive osseous lesion.  Review of the MIP images confirms the above findings.  IMPRESSION: Chest Impression:  No evidence of acute pulmonary embolism.  Abdomen / Pelvis Impression:  1. Mass adjacent to pancreatic head and along the C-loop of the duodenum measuring 6 cm. No pancreatic duct dilatation or biliary duct dilatation. Differential includes pancreatic neoplasm, gastrointestinal stromal tumor, or lymphoma. Recommend endoscopic ultrasound with biopsy of this lesion.  2. Local lymphadenopathy with enlarged peripancreatic lymph node.  3. Liver parenchyma is poorly evaluated on this essentially noncontrast exam.  Findings conveyed toDAWOOD ELGERGAWY on 05/10/2014  at19:17.   Electronically Signed   By: Suzy Bouchard M.D.   On: 05/10/2014 19:18   Ct Abdomen Pelvis W Contrast  05/31/2014   CLINICAL DATA:  Generalized abdominal pain, worsening over past 2 weeks. Newly diagnosed pancreatic carcinoma.  EXAM: CT ABDOMEN AND PELVIS WITH CONTRAST  TECHNIQUE: Multidetector CT imaging of the abdomen and pelvis was performed using the standard protocol following bolus administration of intravenous contrast.  CONTRAST:  117mL OMNIPAQUE IOHEXOL 300 MG/ML SOLN, 68mL OMNIPAQUE IOHEXOL 300 MG/ML SOLN  COMPARISON:  05/12/2014  FINDINGS: Lower Chest: Tiny bilateral pleural effusions and dependent atelectasis.  Hepatobiliary: No masses or other significant abnormality identified. Prior cholecystectomy noted.  Pancreas: 6.4 cm mass involving the pancreatic head and descending duodenum. This is not significant changed in size compared to previous study. Increased hazy opacity is seen surrounding the duodenum and pancreatic head with small amount of fluid seen in Morison's pouch. This may be due to duodenitis or pancreatitis.  Spleen:  Within normal limits in size and appearance.  Adrenal Glands:  No mass  identified.  Kidneys/Urinary Tract: Tiny less than 1 cm bilateral renal calculi are again noted. No evidence of ureteral calculi or hydronephrosis. Bilateral perinephric soft tissue stranding and small amount of fluid are stable in appearance and nonspecific.  Stomach/Bowel/Peritoneum: No evidence of wall thickening, mass, or obstruction. Colonic diverticulosis noted. No evidence of diverticulitis.  Vascular/Lymphatic: Peripancreatic lymphadenopathy noted, with largest lymph node measuring 1.8 cm on image 40, which is unchanged.  Reproductive:  No mass or other significant abnormality identified.  Other:  None.  Musculoskeletal:  No suspicious bone lesions identified.  IMPRESSION: Stable 6 cm mass involving the pancreatic head and duodenum, consistent with known pancreatic carcinoma. No evidence of obstruction.  Stable mild peripancreatic lymphadenopathy, consistent metastatic disease.  Increased hazy opacity surrounding the pancreatic head and duodenum and small amount of fluid within Morison's pouch. This may be related to pancreatitis or duodenitis.  Bilateral nonobstructive nephrolithiasis and diverticulosis incidentally noted.   Electronically Signed   By: Earle Gell M.D.   On: 05/31/2014 14:36   Ct Abdomen Pelvis W Contrast  05/10/2014   CLINICAL DATA:  Leukocytosis, fever of unknown origin, short of breath, chest pain and dizziness.  EXAM: CT ANGIOGRAPHY CHEST  CT ABDOMEN AND PELVIS WITH CONTRAST  TECHNIQUE: Multidetector CT imaging of the chest was performed using the standard protocol during bolus administration of intravenous contrast. Multiplanar CT image reconstructions and MIPs were obtained to evaluate the vascular anatomy. Multidetector CT imaging of the abdomen and pelvis was performed using the standard protocol during bolus administration of intravenous contrast.  CONTRAST:  89mL OMNIPAQUE IOHEXOL 350 MG/ML SOLN  COMPARISON:  80 mL Omnipaque 350  FINDINGS: CTA CHEST FINDINGS  No filling  defects within the pulmonary arteries to suggest acute pulmonary embolism. No acute findings aorta great vessels. No pericardial fluid. Esophagus is normal.  Review of the lung parenchyma demonstrates no suspicious pulmonary nodules. No airspace disease. No pleural fluid.  CT  ABDOMEN and PELVIS FINDINGS  Abdominal portion was scanned at a delayed time . Therefore it is essentially a noncontrast exam.  Hepatobiliary: No focal hepatic lesion identified on this noncontrast exam. Post cholecystectomy.  Pancreas: There is an mass at the level of the pancreatic head measuring 5.2 x 3.1 x 6.1 cm. This mass is along the medial border of the second portion the duodenum and compressive the lumen of the duodenum duodenum. There is enlarged lymph node adjacent to the mass measuring 19 mm. No pancreatic duct dilatation. There is no biliary duct dilatation.  Spleen: Normal spleen appear  Adrenals/Urinary Tract: Adrenal glands are normal. Delayed imaging of the kidneys demonstrate no obstruction. Ureters and bladder normal.  Stomach/Bowel: Stomach, small bowel and appendix are normal. The colon rectosigmoid colon are normal. There are diverticula of the sigmoid colon acute inflammation.  Mass in the C-loop of the duodenum described in the pancreatic section.  Vascular/Lymphatic: There is scattered calcification of the abdominal aorta. No retroperitoneal periportal lymphadenopathy. No mesenteric or peritoneal metastasis.  Reproductive: Prostate gland and bladder normal. No pelvic lymphadenopathy.  Other: No free fluid in the abdomen pelvis.  No free air  Musculoskeletal: No aggressive osseous lesion.  Review of the MIP images confirms the above findings.  IMPRESSION: Chest Impression:  No evidence of acute pulmonary embolism.  Abdomen / Pelvis Impression:  1. Mass adjacent to pancreatic head and along the C-loop of the duodenum measuring 6 cm. No pancreatic duct dilatation or biliary duct dilatation. Differential includes  pancreatic neoplasm, gastrointestinal stromal tumor, or lymphoma. Recommend endoscopic ultrasound with biopsy of this lesion.  2. Local lymphadenopathy with enlarged peripancreatic lymph node.  3. Liver parenchyma is poorly evaluated on this essentially noncontrast exam.  Findings conveyed toDAWOOD ELGERGAWY on 05/10/2014  at19:17.   Electronically Signed   By: Suzy Bouchard M.D.   On: 05/10/2014 19:18   Nm Pet Image Initial (pi) Skull Base To Thigh  05/29/2014   CLINICAL DATA:  Initial treatment strategy for poorly differentiated carcinoma of unclear primary.  EXAM: NUCLEAR MEDICINE PET SKULL BASE TO THIGH  TECHNIQUE: 15.9 mCi F-18 FDG was injected intravenously. Full-ring PET imaging was performed from the skull base to thigh after the radiotracer. CT data was obtained and used for attenuation correction and anatomic localization.  FASTING BLOOD GLUCOSE:  Value: 181 mg/dl  COMPARISON:  CT 05/10/2014  FINDINGS: NECK  No hypermetabolic lymph nodes in the neck.  CHEST  No hypermetabolic mediastinal or hilar nodes. No suspicious pulmonary nodules on the CT scan.  ABDOMEN/PELVIS  There is again demonstrated a mass along the second portion duodenum adjacent to pancreatic head measuring approximately 6.7 x 5.7 cm not changed in volume compared to prior. This mass is intensely hypermetabolic with SUV equal 02.7. Posterior to the mass there is a small hypermetabolic lymph node measuring 18 mm on image 143.  No evidence of hepatic metastasis. No additional metabolic lesions in the abdomen or pelvis. There is stranding around the left and right kidney.  SKELETON  No focal hypermetabolic activity to suggest skeletal metastasis.  IMPRESSION: 1. Mass along the C-loop of the duodenum adjacent to pancreatic head is intensely hypermetabolic consistent with carcinoma. 2. Adjacent small peripancreatic hypermetabolic lymph node. 3. No additional hypermetabolic adenopathy or metastatic disease.   Electronically Signed   By:  Suzy Bouchard M.D.   On: 05/29/2014 16:17   Ct Biopsy  05/17/2014   INDICATION: History of duodenal mass. Patient underwent nondiagnostic endoscopic biopsy and presents now for  CT-guided biopsy of a peripancreatic/duodenal lymph node for tissue diagnostic purposes  EXAM: CT GUIDED BIOPSY OF PERIPANCREATIC/DUODENAL LYMPH NODE  COMPARISON:  CT the abdomen pelvis - 05/10/2014  MEDICATIONS: Fentanyl 50 mcg IV; Versed 1.5 mg IV  ANESTHESIA/SEDATION: Sedation time  20 minutes  CONTRAST:  None  COMPLICATIONS: None immediate  PROCEDURE: Informed consent was obtained from the patient following an explanation of the procedure, risks, benefits and alternatives. A time out was performed prior to the initiation of the procedure.  The patient was positioned right lateral decubitus on the CT table and a limited CT was performed for procedural planning demonstrating unchanged size and appearance of previously identified approximately 2.2 x 1.9 cm enlarged peripancreatic/duodenal lymph node immediately adjacent to the large intraluminal duodenal mass (image 40, series 2). The procedure was planned. The operative site was prepped and draped in the usual sterile fashion. Appropriate trajectory was confirmed with a 22 gauge spinal needle after the adjacent tissues were anesthetized with 1% Lidocaine with epinephrine.  Under intermittent CT guidance, a 17 gauge coaxial needle was advanced adjacent to the peripheral aspect of the lymph node (note, given patient body habitus, the coaxial needle was still approximately 2.3 cm too short for purchase directly within in the enlarged lymph node (representative image 2, series 6).  4 core needle samples were obtained with an 18 gauge core needle biopsy device while slightly advancing the biopsy device system. The co-axial needle was removed and hemostasis was achieved with manual compression.  A limited postprocedural CT was negative for hemorrhage or additional complication. A dressing  was placed. The patient tolerated the procedure well without immediate postprocedural complication.  IMPRESSION: Technically successful CT guided core needle biopsy of dominant peripancreatic/duodenal lymph node.   Electronically Signed   By: Sandi Mariscal M.D.   On: 05/17/2014 10:55   Dg Chest Port 1 View  06/02/2014   CLINICAL DATA:  Dyspnea  EXAM: PORTABLE CHEST - 1 VIEW  COMPARISON:  May 31, 2014  FINDINGS: Central catheter tip is in the superior vena cava. No pneumothorax. There is atelectatic change in the left base. Lungs elsewhere clear. Heart is borderline enlarged in size with pulmonary vascularity within normal limits. No adenopathy.  IMPRESSION: Central catheter tip in superior vena cava. No pneumothorax. Left base atelectasis. Elsewhere lungs clear. No change in cardiac silhouette.   Electronically Signed   By: Lowella Grip M.D.   On: 06/02/2014 11:04   Dg Chest Port 1 View  05/13/2014   CLINICAL DATA:  Fever, diabetes, hypertension  EXAM: PORTABLE CHEST - 1 VIEW  COMPARISON:  05/09/2014  FINDINGS: Stable cardiomegaly.  Atheromatous aorta.  Lungs are clear. Small left pleural effusion as before. Left arm PICC line to the cavoatrial junction. Regional bones unremarkable.  IMPRESSION: 1. Stable cardiomegaly and small left pleural effusion. 2. Left PICC line to cavoatrial junction.   Electronically Signed   By: Arne Cleveland M.D.   On: 05/13/2014 15:08   Dg Chest Port 1 View  05/09/2014   CLINICAL DATA:  Chest pain  EXAM: PORTABLE CHEST - 1 VIEW  COMPARISON:  05/01/2014  FINDINGS: Cardiac shadow remains enlarged. The lungs are well aerated bilaterally without focal infiltrate or sizable effusion. Mild elevation of the right hemidiaphragm is again seen. No gross soft tissue abnormality is noted.  IMPRESSION: No acute abnormality seen.   Electronically Signed   By: Inez Catalina M.D.   On: 05/09/2014 08:23   Dg Shoulder Left  05/31/2014   CLINICAL DATA:  Pain and weakness  EXAM: LEFT  SHOULDER - 2+ VIEW  COMPARISON:  None.  FINDINGS: Frontal, Y scapular, and axillary images were obtained. There is no demonstrable fracture or dislocation. There is osteoarthritic change in the glenohumeral and acromioclavicular joints. No erosive change or intra-articular calcification.  IMPRESSION: Osteoarthritic change.  No fracture or dislocation.   Electronically Signed   By: Lowella Grip M.D.   On: 05/31/2014 15:53   Dg Abd Acute W/chest  05/31/2014   CLINICAL DATA:  Abdominal pain with nausea.  Pancreatic carcinoma.  EXAM: ACUTE ABDOMEN SERIES (ABDOMEN 2 VIEW & CHEST 1 VIEW)  COMPARISON:  PET-CT May 29, 2014 ; chest radiograph May 17, 2014  FINDINGS: PA chest: There is atelectatic change in the left base. Elsewhere lungs are clear. Heart is borderline enlarged with pulmonary vascularity within normal limits. No adenopathy.  Supine and left lateral decubitus abdomen: There is moderate stool throughout the colon. There is no bowel dilatation or air-fluid level suggesting obstruction. No free air. Surgical clips are present in the right upper quadrant. There is a phlebolith in left pelvis.  IMPRESSION: Unremarkable bowel gas pattern. No obstruction or free air. Left base atelectasis. No lung edema or consolidation.   Electronically Signed   By: Lowella Grip M.D.   On: 05/31/2014 12:22    ECG  NSR with HR 80s, low voltage, PACs  ASSESSMENT AND PLAN  1. Diffuse anarsarca  - likely multifactorial in origin, secondary to acute on chronic diastolic HF and third spacing as result of protein malnutrition  - Continue IV lasix monitor renal function, no need to repeat Echo since last echo less than 1 month ago   2. Acute on chronic anemia secondary to pancreatic CA  - s/p transfusion  3. Possible sepsis: hypotensive and tachycardic on arrival, fever?  - blood culture negative. On Cipro PO  4. Pancreatic head CA  - pending porta-cath and possibly start on radiation and chemo next  Mon  5. Hypertension 6. Hyperlipidemia 7. DM 8. CAD status post remote PCI with rotational atherectomy of LAD 9. SVT status post ablation by Dr. Lovena Le    Signed, Almyra Deforest, PA-C 06/05/2014, 4:38 PM   History and all data above reviewed.  Patient examined.  I agree with the findings as above.  The patient has had progressive anasarca with edema in the left leg and arm more than the right.  He is not having PND or orthopnea.  He has had weakness but no acute SOB.   The patient exam reveals COR:RRR  ,  Lungs: Clear  ,  Abd: Positive bowel sounds, no rebound no guarding , Ext Diffuse edema   .  All available labs, radiology testing, previous records reviewed. Agree with documented assessment and plan.   Edema:  I don't suspect an acute cardiac etiology as much as third spacing related to protein calorie malnutrition.  I discussed this with the patient and his family.  I agree with current plans for diuresis.  No further cardiac work up is indicated.     Jeneen Rinks Murray Durrell  5:58 PM  06/05/2014

## 2014-06-05 NOTE — Progress Notes (Signed)
  Radiation Oncology         [phone] ________________________________  Name: [name] MRN: [id number]  Date: 06/04/2014  DOB: February 25, 1941  SIMULATION AND TREATMENT PLANNING NOTE   CONSENT VERIFIED: yes   SET UP: Patient is set-up supine   IMMOBILIZATION: The following immobilization is used: alpha-cradle. This complex treatment device will be used on a daily basis during the patient's treatment.   Diagnosis: Pancreatic cancer   NARRATIVE: The patient was brought to the [facility]. Identity was confirmed. All relevant records and images related to the planned course of therapy were reviewed. Then, the patient was positioned in a stable reproducible clinical set-up for radiation therapy using a customized Vac-lock bag. Skin markings were placed. The CT images were loaded into the planning software where the target and avoidance structures were contoured.The radiation prescription was entered and confirmed.   The patient will receive 54 Gy in 28 fractions. The patient's treatment will consist of a simultaneous integrated boost technique. The gross tumor volume will receive 54 gray and a planning target volume will receive 50.4 gray.  Daily image guidance is ordered, and this will be used on a daily basis. This is necessary to ensure accurate and precise localization of the target in addition to accurate alignment of the normal tissue structures in this region.   Treatment planning then occurred.   I have requested : Intensity Modulated Radiotherapy (IMRT) is medically necessary for this case for the following reason: Dose homogeneity; the target is in close proximity to critical normal structures, including the kidneys and the liver. IMRT is thus medically to appropriately treat the patient.   Special treatment procedure  The patient will receive chemotherapy during the course of radiation treatment. The patient may experience increased or overlapping toxicity due  to this combined-modality approach and the patient will be monitored for such problems. This may include extra lab work as necessary. This therefore constitutes a special treatment procedure.    ________________________________  Jodelle Gross, MD, PhD

## 2014-06-06 LAB — CBC
HCT: 25.7 % — ABNORMAL LOW (ref 39.0–52.0)
HEMOGLOBIN: 8.1 g/dL — AB (ref 13.0–17.0)
MCH: 28.9 pg (ref 26.0–34.0)
MCHC: 31.5 g/dL (ref 30.0–36.0)
MCV: 91.8 fL (ref 78.0–100.0)
Platelets: 313 10*3/uL (ref 150–400)
RBC: 2.8 MIL/uL — ABNORMAL LOW (ref 4.22–5.81)
RDW: 15.9 % — ABNORMAL HIGH (ref 11.5–15.5)
WBC: 24.2 10*3/uL — ABNORMAL HIGH (ref 4.0–10.5)

## 2014-06-06 LAB — BASIC METABOLIC PANEL
Anion gap: 7 (ref 5–15)
BUN: 25 mg/dL — AB (ref 6–23)
CALCIUM: 8 mg/dL — AB (ref 8.4–10.5)
CO2: 24 mmol/L (ref 19–32)
CREATININE: 1.09 mg/dL (ref 0.50–1.35)
Chloride: 100 mEq/L (ref 96–112)
GFR calc Af Amer: 76 mL/min — ABNORMAL LOW (ref >90)
GFR, EST NON AFRICAN AMERICAN: 66 mL/min — AB (ref >90)
GLUCOSE: 146 mg/dL — AB (ref 70–99)
Potassium: 4.3 mmol/L (ref 3.5–5.1)
Sodium: 131 mmol/L — ABNORMAL LOW (ref 135–145)

## 2014-06-06 LAB — GLUCOSE, CAPILLARY
GLUCOSE-CAPILLARY: 225 mg/dL — AB (ref 70–99)
Glucose-Capillary: 200 mg/dL — ABNORMAL HIGH (ref 70–99)
Glucose-Capillary: 140 mg/dL — ABNORMAL HIGH (ref 70–99)
Glucose-Capillary: 226 mg/dL — ABNORMAL HIGH (ref 70–99)
Glucose-Capillary: 271 mg/dL — ABNORMAL HIGH (ref 70–99)

## 2014-06-06 MED ORDER — ALPRAZOLAM 0.5 MG PO TABS
0.5000 mg | ORAL_TABLET | Freq: Three times a day (TID) | ORAL | Status: DC | PRN
  Administered 2014-06-09: 0.5 mg via ORAL
  Filled 2014-06-06: qty 1

## 2014-06-06 MED ORDER — CITALOPRAM HYDROBROMIDE 20 MG PO TABS
20.0000 mg | ORAL_TABLET | Freq: Every day | ORAL | Status: DC
  Administered 2014-06-06 – 2014-06-07 (×2): 20 mg via ORAL
  Filled 2014-06-06 (×4): qty 1

## 2014-06-06 MED ORDER — ZOLPIDEM TARTRATE 5 MG PO TABS
5.0000 mg | ORAL_TABLET | Freq: Every evening | ORAL | Status: DC | PRN
  Administered 2014-06-06 – 2014-06-09 (×3): 5 mg via ORAL
  Filled 2014-06-06 (×3): qty 1

## 2014-06-06 MED ORDER — DIPHENHYDRAMINE HCL 50 MG PO CAPS: 50.0000 mg | ORAL_CAPSULE | Freq: Every evening | ORAL | Status: DC | PRN

## 2014-06-06 NOTE — Progress Notes (Signed)
Physical Therapy Treatment Patient Details Name: [name] MRN: [id number] DOB: [birth year] Today's Date: 06/06/2014    History of Present Illness Pt is 73 yo  male adm 05/31/14 with sepsis of unclear etiology; PMHx:  HTN, HLD, DM, pancreatic cancer    PT Comments    Assisted pt OOB to attempt amb however upon standing pt c/o MAX dizziness with nausea and eyes shifty.  Orthostatic BP's taken:                    Supine 143/54                                        EOB    128/53                                        Standing 85/48  Max c/o dizziness RN called to room.  Pt returned to supine position.    Follow Up Recommendations  Home health PT;Supervision for mobility/OOB (spouse is hoping for D/C to home)     Equipment Recommendations       Recommendations for Other Services       Precautions / Restrictions Precautions Precautions: Fall Precaution Comments: ABD surgery Restrictions Weight Bearing Restrictions: No    Mobility  Bed Mobility Overal bed mobility: Needs Assistance Bed Mobility: Supine to Sit;Sit to Supine     Supine to sit: Min assist Sit to supine: Min assist   General bed mobility comments: min assist for upper body supine to sit then min assist B LE's onto bed  Transfers Overall transfer level: Needs assistance Equipment used: Rolling walker (2 wheeled) Transfers: Sit to/from Stand Sit to Stand: Min guard;Min assist         General transfer comment: assist to power up as pt uses momentum vs pushing up from bed  Ambulation/Gait             General Gait Details: unable to attempt amb due to MAX c/o dizziness and orthostatic BP's.  Reported to RN.    Stairs            Wheelchair Mobility    Modified Rankin (Stroke Patients Only)       Balance                                    Cognition                            Exercises      General Comments        Pertinent Vitals/Pain Pain  Assessment: Faces Faces Pain Scale: Hurts a little bit Pain Location: ABD Pain Descriptors / Indicators: Tender;Sore Pain Intervention(s): Monitored during session;Repositioned    Home Living                      Prior Function            PT Goals (current goals can now be found in the care plan section) Progress towards PT goals: Progressing toward goals    Frequency  Min 3X/week    PT Plan      Co-evaluation  End of Session Equipment Utilized During Treatment: Gait belt Activity Tolerance: Treatment limited secondary to medical complications (Comment) Patient left: in bed;with call bell/phone within reach;with family/visitor present     Time: 7615-1834 PT Time Calculation (min) (ACUTE ONLY): 24 min  Charges:  $Therapeutic Activity: 23-37 mins                    G Codes:      Rica Koyanagi  PTA [facility]  Acute  Rehab Pager      [phone]

## 2014-06-06 NOTE — Progress Notes (Signed)
Patient ID: [name], male   DOB: [birth year], 73 y.o.   MRN: [id number]  [facility] PROGRESS NOTE  FERMIN YAN ONG:[id number] DOB: Nov 12, 1940 DOA: 05/31/2014 PCP: [name], MD   Brief narrative:   Pt is 73 yo very pleasant male with HTN, HLD, DM type II (on insulin and Metformin), pancreatic cancer (follows with Dr. Alen Blew), recently hospitalized for C. Diff and still on oral Vancomycin, now presented to [facility] ED with main concern of several days duration of progressively worsening abdominal pain, intermittent in nature and throbbing, worse on the left side and occasionally but not consistently radiating to the right side, 7/10 in severity when present, with no specific alleviating or aggravating factors. This has been associated with nausea and poor oral intake, several lbs weight loss, fatigue, dyspnea on exertion. He denies any diarrhea or constipation, no blood in stool, no specific urinary concerns, no blood in urine, no fevers, chills,no chest pain.   In ED, pt noted to be in mild distress due to pain. VS notable for BP 85/43 mmHg, HR 113 bpm, RR 22 bpm, T 99.48F. Blood work notable for WBC > 30K, Hg 7.9 (repeat Hg 8.5, prior to transfusion). CT abd with ? Duodenitis. [facility] asked to admit for further evaluation and management of sepsis of unclear etiology. SDU requested. 2 U PRBC requested.   Assessment and Plan: Active Problems: Sepsis - criteria met on admission with tachycardia, leukocytosis, fever, RR 22, hypotension - possibly related to ? intra-abd source (? Duodenitis on CT abd) - continued broad spectrum ABX Vanc and Aztreonam for 5 days, transitioned to Cipro 12/21 and will continue for now - pt with fever spike 101.3 F night of 12/22 and unclear if related to downgrading ABX - will keep on Cipro, will ask ID specialist to assist with ABX management - discussed with Dr. Alen Blew, he suggested that cancer can also be the source  - C. Diff by PCR negative   Diastolic CHF, acute on chronic  - with significant lower and upper extremity pitting edema - in the setting of hypoalbuminemia, not cardiac issue  - weight on admission 306 lbs --> up to 311 lbs --> 303 lbs this AM - placed on Lasix 40 mg IV QD 12/18 and increased frequency to BID on 12/20 to improve diuresis  - monitor weight, I's and O's Acute on chronic blood loss anemia, of pancreatic cancer, DM - review of records indicate that Hg is ~9 at baseline - pt denies any blood in stool or urine, FOBT is negative  - cancer is possible source of bleed - repeat Hg 12/17 was as low as 6.3, pt has received 2 U PRBC 12/17 and with appropriate increase in Hg 8.6 - Hg now remains stable with no signs of active bleeding  - IR consulted for port-a-cath placement but this will be on hold until infection resolves Leukocytosis - unclear etiology and has been somewhat of a challenge since last admission - WBC trending up and down, very unpredictable  - oral Vanc for C. Diff discontinued (pt started on Dec 4th, 2015), completed Dec 19th, 2015 - continue Cipro as noted above for ? Duodenitis  DM, type II, with complications of anemia and CKD II - A1C 8.8  - continue low dose Lantus for now until oral intake improves - hold Metformin and place on SSI for now  HTN - holding home medical regimen Metoprolol and Lisinopril - continuing Lasix in an attempt to improve diuresis  RUE edema >  LUE, LE edema bilateral  - doppler scan negative for DVT  - continue Lasix as noted above  HLD - on statin  CKD stage II - Cr is now stable and WNL Pancreatic cancer - appreciate Dr. 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  Family Communication: plan of care discussed with the patient Disposition Plan: Remains inpatient   IV access:   Peripheral IV Procedures and diagnostic studies:    Ct Abdomen Pelvis W Contrast 05/31/2014 Stable 6 cm mass involving the pancreatic head and duodenum, consistent with known pancreatic carcinoma. No evidence of obstruction. Stable mild peripancreatic lymphadenopathy, consistent metastatic disease. Increased hazy opacity surrounding the pancreatic head and duodenum and small amount of fluid within Morison's pouch. This may be related to pancreatitis or duodenitis. Bilateral nonobstructive nephrolithiasis and diverticulosis incidentally noted.   Nm Pet Image Initial (pi) Skull Base To Thigh 05/29/2014 1. Mass along the C-loop of the duodenum adjacent to pancreatic head is intensely hypermetabolic consistent with carcinoma. 2. Adjacent small peripancreatic hypermetabolic lymph node. 3. No additional hypermetabolic adenopathy or metastatic disease.   Dg Abd Acute W/ches 05/31/2014 Unremarkable bowel gas pattern. No obstruction or free air. Left base atelectasis. No lung edema or consolidation.  Medical Consultants:   Oncology Dr. Alen Blew   IR for port-a-cath placement  Cardiology for management of anasarca  Other Consultants:   None  IAnti-Infectives:    Vancomycin 12/17 --> 12/21  Aztreonam 12/17 --> 12/21  Cipro 12/21 -->  [name], MD  [facility] Pager [phone]  If 7PM-7AM, please contact night-coverage [email or web address] Password [email or web address] 06/06/2014, 10:22 AM   LOS: 6 days   HPI/Subjective: No events overnight.   Objective: Filed Vitals:   06/05/14 2034 06/05/14 2138 06/06/14 0200 06/06/14 0550  BP:  126/62 128/55 123/55  Pulse: 92 90 98 92  Temp:  98.6 F (37 C) 101.3 F (38.5 C) 99.2 F (37.3 C)  TempSrc:  Oral Oral Oral  Resp: $Remo'18  20 18 18  'RViqL$ Height:      Weight:      SpO2: 97% 97% 93% 97%    Intake/Output Summary (Last 24 hours) at 06/06/14 1022 Last data filed at 06/06/14 0919  Gross per 24 hour  Intake    180 ml  Output   2075 ml  Net  -1895 ml    Exam:   General:  Pt is alert, follows commands appropriately, not in acute distress  Cardiovascular: Regular rate and rhythm, no murmurs, no rubs, no gallops  Respiratory: Clear to auscultation bilaterally, no wheezing, diminished breath sounds at bases   Abdomen: Soft, non tender, non distended, bowel sounds present, no guarding  Extremities: +2 bilateral upper and lower extremity edema, pulses DP and PT palpable bilaterally  Data Reviewed: Basic Metabolic Panel:  Recent Labs Lab 06/02/14 0500 06/03/14 0156 06/03/14 0157 06/04/14 0417 06/05/14 0440 06/06/14 0535  NA 134* 132*  --  131* 130* 131*  K 4.4 4.4  --  4.7 4.6 4.3  CL 100 96  --  96 98 100  CO2 21 22  --  $R'23 24 24  'Lt$ GLUCOSE 187* 192*  --  187*  222* 146*  BUN 22 20  --  22 24* 25*  CREATININE 1.03 1.08  --  1.07 1.07 1.09  CALCIUM 8.2* 8.3*  --  8.5 7.8* 8.0*  MG  --   --  1.6  --   --   --    Liver Function Tests:  Recent Labs Lab 05/31/14 1116  AST 21  ALT 18  ALKPHOS 65  BILITOT 0.6  PROT 5.8*  ALBUMIN 1.5*    Recent Labs Lab 05/31/14 1116  LIPASE 21   CBC:  Recent Labs Lab 05/31/14 1116  06/02/14 0500 06/03/14 0156 06/04/14 0417 06/05/14 0440 06/06/14 0535  WBC 36.0*  < > 28.5* 29.5* 25.0* 17.9* 24.2*  NEUTROABS 30.6*  --   --   --  20.3*  --   --   HGB 7.9*  < > 9.1* 8.8* 8.3* 8.1* 8.1*  HCT 24.5*  < > 27.1* 27.3* 26.1* 25.2* 25.7*  MCV 94.6  < > 91.9 91.3 92.9 91.6 91.8  PLT 314  < > 257 267 286 266 313  < > = values in this interval not displayed.  CBG:  Recent Labs Lab 06/05/14 0735 06/05/14 1221 06/05/14 1650 06/05/14 2133 06/06/14 0744  GLUCAP 208* 305* 250* 200* 140*    Recent Results (from the past 240 hour(s))  MRSA PCR Screening      Status: None   Collection Time: 05/31/14  4:25 PM  Result Value Ref Range Status   MRSA by PCR NEGATIVE NEGATIVE Final    Comment:        The GeneXpert MRSA Assay (FDA approved for NASAL specimens only), is one component of a comprehensive MRSA colonization surveillance program. It is not intended to diagnose MRSA infection nor to guide or monitor treatment for MRSA infections.   Urine culture     Status: None   Collection Time: 05/31/14  9:15 PM  Result Value Ref Range Status   Specimen Description URINE, RANDOM  Final   Special Requests NONE  Final   Culture  Setup Time   Final    06/01/2014 01:24 Performed at [place]   Final    4,000 COLONIES/ML Performed at [facility]    Culture   Final    INSIGNIFICANT GROWTH Performed at [facility]    Report Status 06/01/2014 FINAL  Final  Culture, blood (routine x 2)     Status: None (Preliminary result)   Collection Time: 05/31/14 10:59 PM  Result Value Ref Range Status   Specimen Description BLOOD PICC  Final   Special Requests BOTTLES DRAWN AEROBIC ONLY 3ML  Final   Culture  Setup Time   Final    06/01/2014 04:41 Performed at [facility]    Culture   Final           BLOOD CULTURE RECEIVED NO GROWTH TO DATE CULTURE WILL BE HELD FOR 5 DAYS BEFORE ISSUING A FINAL NEGATIVE REPORT Performed at [facility]    Report Status PENDING  Incomplete  Clostridium Difficile by PCR     Status: None   Collection Time: 06/01/14  6:20 AM  Result Value Ref Range Status   C difficile by pcr NEGATIVE NEGATIVE Final    Comment: Performed at [facility]     Scheduled Meds: . sodium chloride   Intravenous Q72H  . ciprofloxacin  500 mg Oral BID  . doxazosin  2 mg Oral QAC breakfast  .  feeding supplement (GLUCERNA SHAKE)  237 mL Oral BID BM  . fluconazole  100 mg Oral Daily  . furosemide  40 mg Intravenous BID  . insulin aspart  0-15 Units Subcutaneous TID WC  .  insulin aspart protamine- aspart  55 Units Subcutaneous BID WC  . pantoprazole  40 mg Oral Daily  . sodium chloride  10-40 mL Intracatheter Q12H   Continuous Infusions:

## 2014-06-06 NOTE — Progress Notes (Signed)
OT Cancellation Note  Patient Details Name: [name] MRN: [id number] DOB: 01/23/1941   Cancelled Treatment:    Reason Eval/Treat Not Completed: Fatigue/lethargy limiting ability to participate  Wife stated pt had just gone to sleep and did not want pt woken at this time. Will check on pt later in day or next day.  Betsy Pries 06/06/2014, 10:01 AM

## 2014-06-07 ENCOUNTER — Encounter (HOSPITAL_COMMUNITY)
Admission: EM | Disposition: A | Discharge status: Skilled Nursing Facility | Payer: Self-pay | Source: Home / Self Care | Attending: Internal Medicine

## 2014-06-07 ENCOUNTER — Inpatient Hospital Stay (HOSPITAL_COMMUNITY): Payer: Medicare Other

## 2014-06-07 DIAGNOSIS — R1310 Dysphagia, unspecified: Secondary | ICD-10-CM

## 2014-06-07 DIAGNOSIS — K831 Obstruction of bile duct: Secondary | ICD-10-CM

## 2014-06-07 DIAGNOSIS — D72829 Elevated white blood cell count, unspecified: Secondary | ICD-10-CM

## 2014-06-07 DIAGNOSIS — R509 Fever, unspecified: Secondary | ICD-10-CM

## 2014-06-07 DIAGNOSIS — R5081 Fever presenting with conditions classified elsewhere: Secondary | ICD-10-CM

## 2014-06-07 DIAGNOSIS — A047 Enterocolitis due to Clostridium difficile: Secondary | ICD-10-CM

## 2014-06-07 LAB — CBC
HEMATOCRIT: 25.1 % — AB (ref 39.0–52.0)
HEMOGLOBIN: 8.1 g/dL — AB (ref 13.0–17.0)
MCH: 29.1 pg (ref 26.0–34.0)
MCHC: 32.3 g/dL (ref 30.0–36.0)
MCV: 90.3 fL (ref 78.0–100.0)
Platelets: 308 10*3/uL (ref 150–400)
RBC: 2.78 MIL/uL — ABNORMAL LOW (ref 4.22–5.81)
RDW: 15.7 % — ABNORMAL HIGH (ref 11.5–15.5)
WBC: 23.4 10*3/uL — ABNORMAL HIGH (ref 4.0–10.5)

## 2014-06-07 LAB — GLUCOSE, CAPILLARY
GLUCOSE-CAPILLARY: 206 mg/dL — AB (ref 70–99)
Glucose-Capillary: 197 mg/dL — ABNORMAL HIGH (ref 70–99)
Glucose-Capillary: 248 mg/dL — ABNORMAL HIGH (ref 70–99)
Glucose-Capillary: 285 mg/dL — ABNORMAL HIGH (ref 70–99)

## 2014-06-07 LAB — COMPREHENSIVE METABOLIC PANEL
ALK PHOS: 302 U/L — AB (ref 39–117)
ALT: 116 U/L — ABNORMAL HIGH (ref 0–53)
ANION GAP: 8 (ref 5–15)
AST: 182 U/L — ABNORMAL HIGH (ref 0–37)
Albumin: 1.5 g/dL — ABNORMAL LOW (ref 3.5–5.2)
BUN: 25 mg/dL — ABNORMAL HIGH (ref 6–23)
CHLORIDE: 98 meq/L (ref 96–112)
CO2: 26 mmol/L (ref 19–32)
CREATININE: 1.11 mg/dL (ref 0.50–1.35)
Calcium: 7.8 mg/dL — ABNORMAL LOW (ref 8.4–10.5)
GFR calc Af Amer: 75 mL/min — ABNORMAL LOW (ref >90)
GFR, EST NON AFRICAN AMERICAN: 64 mL/min — AB (ref >90)
GLUCOSE: 216 mg/dL — AB (ref 70–99)
POTASSIUM: 4.5 mmol/L (ref 3.5–5.1)
Sodium: 132 mmol/L — ABNORMAL LOW (ref 135–145)
Total Bilirubin: 1.7 mg/dL — ABNORMAL HIGH (ref 0.3–1.2)
Total Protein: 5.9 g/dL — ABNORMAL LOW (ref 6.0–8.3)

## 2014-06-07 LAB — CULTURE, BLOOD (ROUTINE X 2): CULTURE: NO GROWTH

## 2014-06-07 SURGERY — ERCP, WITH INTERVENTION IF INDICATED
Anesthesia: General

## 2014-06-07 MED ORDER — FUROSEMIDE 10 MG/ML IJ SOLN: 20.0000 mg | Freq: Two times a day (BID) | INTRAMUSCULAR | Status: DC

## 2014-06-07 MED ORDER — METRONIDAZOLE IN NACL 5-0.79 MG/ML-% IV SOLN
500.0000 mg | Freq: Three times a day (TID) | INTRAVENOUS | Status: DC
  Administered 2014-06-07 – 2014-06-09 (×6): 500 mg via INTRAVENOUS
  Filled 2014-06-07 (×9): qty 100

## 2014-06-07 MED ORDER — CEFTRIAXONE SODIUM IN DEXTROSE 40 MG/ML IV SOLN
2.0000 g | INTRAVENOUS | Status: DC
  Administered 2014-06-07 – 2014-06-09 (×2): 2 g via INTRAVENOUS
  Filled 2014-06-07 (×3): qty 50

## 2014-06-07 MED ORDER — NYSTATIN 100000 UNIT/ML MT SUSP
5.0000 mL | Freq: Four times a day (QID) | OROMUCOSAL | Status: DC
  Administered 2014-06-07 – 2014-06-14 (×21): 500000 [IU] via ORAL
  Filled 2014-06-07 (×32): qty 5

## 2014-06-07 MED ORDER — MAGIC MOUTHWASH W/LIDOCAINE
5.0000 mL | Freq: Three times a day (TID) | ORAL | Status: DC | PRN
  Filled 2014-06-07: qty 5

## 2014-06-07 MED ORDER — FUROSEMIDE 10 MG/ML IJ SOLN
20.0000 mg | Freq: Every day | INTRAMUSCULAR | Status: DC
  Administered 2014-06-09: 20 mg via INTRAVENOUS
  Filled 2014-06-07 (×2): qty 2

## 2014-06-07 NOTE — Progress Notes (Addendum)
Patient ID: [name], male   DOB: 03/20/1941, 73 y.o.   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 Clelia Croft), recently hospitalized for C. Diff and still on oral Vancomycin, now presented to [facility] ED with main concern of several days duration of progressively worsening abdominal pain, intermittent in nature and throbbing, worse on the left side and occasionally but not consistently radiating to the right side, 7/10 in severity when present, with no specific alleviating or aggravating factors. This has been associated with nausea and poor oral intake, several lbs weight loss, fatigue, dyspnea on exertion. He denies any diarrhea or constipation, no blood in stool, no specific urinary concerns, no blood in urine, no fevers, chills,no chest pain.   In ED, pt noted to be in mild distress due to pain. VS notable for BP 85/43 mmHg, HR 113 bpm, RR 22 bpm, T 99.76F. Blood work notable for WBC > 30K, Hg 7.9 (repeat Hg 8.5, prior to transfusion). CT abd with ? Duodenitis. [facility] asked to admit for further evaluation and management of sepsis of unclear etiology. SDU requested. 2 U PRBC requested.   Assessment and Plan: Active Problems: Sepsis - criteria met on admission with tachycardia, leukocytosis, fever, RR 22, hypotension - possibly related to ? intra-abd source (? Duodenitis on CT abd) - continued broad spectrum ABX Vanc and Aztreonam for 5 days, transitioned to Cipro 12/21 - LFT's elevated 12/24, worrisome for obstruction at the bile duct given underlying pancreatic cancer, GI consulted for further assistance  - ID consulted for ABX management, appreciate assistance   Diastolic CHF, acute on chronic  - with significant lower and upper extremity pitting edema - in the  setting of hypoalbuminemia, not cardiac issue  - weight on admission 306 lbs --> up to 311 lbs --> 303 lbs --> 295 lbs this AM - placed on Lasix 40 mg IV QD 12/18 and increased frequency to BID on 12/20 to improve diuresis  - since weight is trending down, will change Lasix to 20 mg IV QD, pt still with significant edema  - monitor weight, I's and O's Acute on chronic blood loss anemia, of pancreatic cancer, DM - review of records indicate that Hg is ~9 at baseline - pt denies any blood in stool or urine, FOBT is negative  - cancer is possible source of bleed - repeat Hg 12/17 was as low as 6.3, pt has received 2 U PRBC 12/17 and with appropriate increase in Hg 8.6 - Hg now remains stable with no signs of active bleeding  - IR consulted for port-a-cath placement but this will be on hold until infection resolves Leukocytosis - unclear etiology and has been somewhat of a challenge since last admission - WBC trending up and down, very unpredictable  - oral Vanc for C. Diff discontinued (pt started on Dec 4th, 2015), completed Dec 19th, 2015 - follow up on ID rec's for ABX management  DM, type II, with complications of anemia and CKD II - A1C 8.8  - continue low dose Lantus for now until oral intake improves - hold Metformin and place on SSI for now  HTN - holding home medical regimen Metoprolol and Lisinopril - continuing Lasix in an attempt to improve diuresis  RUE edema > LUE, LE edema  bilateral  - doppler scan negative for DVT  - continue Lasix as noted above  HLD - on statin  CKD stage II - Cr is now stable and WNL Pancreatic cancer - appreciate Dr. 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  Family Communication: plan of care discussed with the patient Disposition Plan: Remains inpatient   IV access:   Peripheral IV Procedures and diagnostic studies:    Ct Abdomen Pelvis W Contrast 05/31/2014 Stable 6 cm mass involving the pancreatic head and duodenum, consistent with known pancreatic carcinoma. No evidence of obstruction. Stable mild peripancreatic lymphadenopathy, consistent metastatic disease. Increased hazy opacity surrounding the pancreatic head and duodenum and small amount of fluid within Morison's pouch. This may be related to pancreatitis or duodenitis. Bilateral nonobstructive nephrolithiasis and diverticulosis incidentally noted.   Nm Pet Image Initial (pi) Skull Base To Thigh 05/29/2014 1. Mass along the C-loop of the duodenum adjacent to pancreatic head is intensely hypermetabolic consistent with carcinoma. 2. Adjacent small peripancreatic hypermetabolic lymph node. 3. No additional hypermetabolic adenopathy or metastatic disease.   Dg Abd Acute W/ches 05/31/2014 Unremarkable bowel gas pattern. No obstruction or free air. Left base atelectasis. No lung edema or consolidation.  Medical Consultants:   Oncology Dr. Alen Blew   IR for port-a-cath placement  Cardiology for management of anasarca   GI  ID Other Consultants:   None  IAnti-Infectives:    Vancomycin 12/17 --> 12/21  Aztreonam 12/17 --> 12/21  Cipro 12/21 -->   [name], MD  [facility] Pager [phone]  If 7PM-7AM, please contact night-coverage [email or web address] Password [email or web address] 06/07/2014, 8:04 AM   LOS: 7 days   HPI/Subjective: No events overnight.   Objective: Filed Vitals:   06/06/14 1850 06/06/14 2119 06/07/14 0234 06/07/14 0442  BP: 126/50 149/58 119/65 103/75  Pulse: 94 99 94 103  Temp: 99.4 F (37.4 C) 98.9 F (37.2 C) 98.2 F (36.8 C) 98.8 F (37.1 C)   TempSrc: Oral  Axillary   Resp: $Remo'18 20 20   'WpNQQ$ Height:      Weight:      SpO2:  96% 100% 93%    Intake/Output Summary (Last 24 hours) at 06/07/14 0804 Last data filed at 06/07/14 0500  Gross per 24 hour  Intake 1320.67 ml  Output   1475 ml  Net -154.33 ml    Exam:   General:  Pt is alert, follows commands appropriately, not in acute distress  Cardiovascular: Regular rate and rhythm, S1/S2, no murmurs, no rubs, no gallops  Respiratory: Clear to auscultation bilaterally, no wheezing, no crackles, no rhonchi  Abdomen: Soft, non tender, slightly distended, bowel sounds present, no guarding  Data Reviewed: Basic Metabolic Panel:  Recent Labs Lab 06/03/14 0156 06/03/14 0157 06/04/14 0417 06/05/14 0440 06/06/14 0535 06/07/14 0445  NA 132*  --  131* 130* 131* 132*  K 4.4  --  4.7 4.6 4.3 4.5  CL 96  --  96 98 100 98  CO2 22  --  $R'23 24 24 26  'KA$ GLUCOSE 192*  --  187* 222* 146* 216*  BUN 20  --  22 24* 25* 25*  CREATININE 1.08  --  1.07 1.07 1.09 1.11  CALCIUM 8.3*  --  8.5 7.8* 8.0* 7.8*  MG  --  1.6  --   --   --   --    Liver Function Tests:  Recent Labs Lab 05/31/14 1116 06/07/14 0445  AST 21 182*  ALT 18 116*  ALKPHOS 65 302*  BILITOT 0.6 1.7*  PROT 5.8* 5.9*  ALBUMIN 1.5* 1.5*    Recent Labs Lab 05/31/14 1116  LIPASE 21   CBC:  Recent Labs Lab 05/31/14 1116  06/03/14 0156 06/04/14 0417 06/05/14 0440 06/06/14 0535 06/07/14 0445  WBC 36.0*  < > 29.5* 25.0* 17.9* 24.2* 23.4*  NEUTROABS 30.6*  --   --  20.3*  --   --   --   HGB 7.9*  < > 8.8* 8.3* 8.1* 8.1* 8.1*  HCT 24.5*  < > 27.3* 26.1* 25.2* 25.7* 25.1*  MCV 94.6  < > 91.3 92.9 91.6 91.8 90.3  PLT 314  < > 267 286 266 313 308  < > = values in this interval not displayed.  CBG:  Recent Labs Lab 06/05/14 2133 06/06/14 0744 06/06/14 1135 06/06/14 1658 06/06/14 2145  GLUCAP 200* 140* 226* 271* 225*    Recent Results (from the past 240 hour(s))  MRSA PCR Screening     Status: None    Collection Time: 05/31/14  4:25 PM  Result Value Ref Range Status   MRSA by PCR NEGATIVE NEGATIVE Final  Urine culture     Status: None   Collection Time: 05/31/14  9:15 PM  Result Value Ref Range Status   Specimen Description URINE, RANDOM  Final   Special Requests NONE  Final   Culture  Setup Time   Final    06/01/2014 01:24 Performed at [facility]   Final    4,000 COLONIES/ML Performed at [facility]    Culture   Final    INSIGNIFICANT GROWTH Performed at [facility]    Report Status 06/01/2014 FINAL  Final  Culture, blood (routine x 2)     Status: None (Preliminary result)   Collection Time: 05/31/14 10:59 PM  Result Value Ref Range Status   Specimen Description BLOOD PICC  Final   Special Requests BOTTLES DRAWN AEROBIC ONLY 3ML  Final   Culture  Setup Time   Final    06/01/2014 04:41 Performed at [facility]    Culture   Final   Report Status PENDING  Incomplete  Clostridium Difficile by PCR     Status: None   Collection Time: 06/01/14  6:20 AM  Result Value Ref Range Status   C difficile by pcr NEGATIVE NEGATIVE Final    Comment: Performed at [facility]     Scheduled Meds: . sodium chloride   Intravenous Q72H  . ciprofloxacin  500 mg Oral BID  . citalopram  20 mg Oral Daily  . doxazosin  2 mg Oral QAC breakfast  . feeding supplement (GLUCERNA SHAKE)  237 mL Oral BID BM  . fluconazole  100 mg Oral Daily  . furosemide  40 mg Intravenous BID  . insulin aspart  0-15 Units Subcutaneous TID WC  . insulin aspart protamine- aspart  55 Units Subcutaneous BID WC  . pantoprazole  40 mg Oral Daily  . sodium chloride  10-40 mL Intracatheter Q12H   Continuous Infusions:

## 2014-06-07 NOTE — Consult Note (Addendum)
[facility] for [facility]  Total days of antibiotics 8        Day 4 cipro        Day 7 fluconazole               Reason for Consult: intermittent fever    Referring Physician: Doyle Askew  Active Problems:   Sepsis   CA head of pancreas    HPI: [name] is a 73 y.o. male with hx of DM, CAD, hx of afib s/p ablation, s/p chole, as well as, recent diagnosis of pancreatic cancer in early December in setting of having melena and hematochezia. He underwent endoscopy on 05/12/14 which showed a significant tumor impinging upon the duodenum. CT scan of the chest abdomen and pelvis obtained during that hospitalization and showed a mass around the region of the pancreatic head. Local lymphadenopathy was also noted. CT-guided biopsy done on 05/17/2014  Showed METASTATIC POORLY DIFFERENTIATED CARCINOMA favoring pancreatic cancer.. A PET/CT scan was obtained on 05/29/2014 showed a mass measuring 6.7 x 5.7 cm along the second portion of the duodenum adjacent to the pancreatic head with small hypermetabolic lymph nodes measuring 18 mm. In early December, he was diagnosed with cdifficile infection, but patient denied having diarrhea at the time, for which he was taking oral vancomycin x 14 days. He was admitted on 12/17 for progressive worsening abdominal pain, worse on the left side and occasionally but not consistently radiating to the right side, 7/10 in severity when present. In ROS, associated with nausea and poor oral intake, several lbs weight loss, fatigue, dyspnea on exertion. He denies any diarrhea or constipation, no blood in stool, no specific urinary concerns, no blood in urine, no fevers, chills,no chest pain. He was found to have elevated WBC at 18K that increased to 29K. Labs were otherwise unremarkable, no transaminitis or pattern of obstruction. Infectious work up of blood cx x 1, urine x, and repeat cdiff were all negative. He had repeat abd CT that showed.  Pancreas: 6.4 cm mass  involving the pancreatic head and descending duodenum. This is not significant changed in size compared to previous study. Increased hazy opacity is seen surrounding the duodenum and pancreatic head with small amount of fluid seen in Morison's pouch. This may be due to duodenitis or pancreatitis. He was started on aztreonam and continue on oral vancomycin, also given a course of fluconazole. He is on  Antibiotic day #8, course changed to ciprofloxacin plus fluconazole for presumed candidal esophagitis. He appears improved from his symptoms except still has burning pain with eating food. He had intermittent fevers during his hospitalization, on 12/18 101.43F, on 12/19 100.8 and on 12/22, his last fever of 101.3. He continues to have drenching nightsweats and chills. Presumed GI source vs. Fevers due to malignancy.  today's lab work showed WBC still elevated at 23K plus hepatic panel now shows signs of obstruction with elevated alp of 300, ast 182, alt 116 and tbili 1.7 (at admit 0.6). GI consulted for possible ercp, stent placement.  Past Medical History  Diagnosis Date  . Diabetes mellitus   . Hyperlipidemia   . Hypertension   . GERD (gastroesophageal reflux disease)   . Coronary artery disease     a. S/P prior PTCA in the 90's;  b. 04/2007 Cath: LM nl, LAD Ca2+ prox, LCX nl, RI nl, OM nl, RCA 40-50p, 40d, EF 60%;  b. 09/2012 Echo: EF 55-60%, mild LVH, nl wall motion, Gr 1 DD, mildly  dil LA.  . Right ureteral stone   . SVT (supraventricular tachycardia)     a. s/p ablation per Dr. Lovena Le 10 -41yr ago  . OSA on CPAP     a. cpap setting of 17  . Arthritis   . Bilateral kidney stones   . Shortness of breath dyspnea   . CKD (chronic kidney disease), stage III   . Pancreatic cancer 05/17/14    Pancreas  . Cancer dx Dec 2015    pancreatic  . Allergy     Allergies:  Allergies  Allergen Reactions  . Clindamycin/Lincomycin Other (See Comments)    dysphagia  . Codeine Itching  . Penicillins Rash      MEDICATIONS: . sodium chloride   Intravenous Q72H  . citalopram  20 mg Oral Daily  . doxazosin  2 mg Oral QAC breakfast  . feeding supplement (GLUCERNA SHAKE)  237 mL Oral BID BM  . fluconazole  100 mg Oral Daily  . furosemide  40 mg Intravenous BID  . insulin aspart  0-15 Units Subcutaneous TID WC  . insulin aspart protamine- aspart  55 Units Subcutaneous BID WC  . pantoprazole  40 mg Oral Daily  . sodium chloride  10-40 mL Intracatheter Q12H    History  Substance Use Topics  . Smoking status: Former Smoker -- 1.00 packs/day for 15 years    Types: Cigarettes    Quit date: 06/15/1986  . Smokeless tobacco: Never Used  . Alcohol Use: Yes     Comment: occasional    Family History  Problem Relation Age of Onset  . Heart attack Mother   . Heart attack Brother   . Colon cancer Neg Hx   . Stomach cancer Neg Hx      Review of Systems  Constitutional: positive for fever, chills, diaphoresis, activity change, appetite change, fatigue and unexpected weight change.  HENT: Negative for congestion, sore throat, rhinorrhea, sneezing, trouble swallowing and sinus pressure.  Eyes: Negative for photophobia and visual disturbance.  Respiratory: Negative for cough, chest tightness, shortness of breath, wheezing and stridor.  Cardiovascular: Negative for chest pain, palpitations and leg swelling.  Gastrointestinal: positive for dysphagai and abdominal pain, abd distention and poor appetite. neagative for nausea, vomiting,  diarrhea, constipation, blood in stool,  and anal bleeding.  Genitourinary: Negative for dysuria, hematuria, flank pain and difficulty urinating.  Musculoskeletal: Negative for myalgias, back pain, joint swelling, arthralgias and gait problem.  Skin: Negative for color change, pallor, rash and wound.  Neurological: Negative for dizziness, tremors, weakness and light-headedness.  Hematological: Negative for adenopathy. Does not bruise/bleed easily.   Psychiatric/Behavioral: Negative for behavioral problems, confusion, sleep disturbance, dysphoric mood, decreased concentration and agitation.     OBJECTIVE: Temp:  [98.2 F (36.8 C)-99.4 F (37.4 C)] 98.8 F (37.1 C) (12/24 0442) Pulse Rate:  [59-103] 103 (12/24 0442) Resp:  [18-20] 20 (12/24 0234) BP: (85-149)/(46-75) 103/75 mmHg (12/24 0442) SpO2:  [93 %-100 %] 93 % (12/24 0442) Physical Exam  Constitutional: He is oriented to person, place, and time. He appears well-developed and well-nourished. No distress.  HENT: no thrush Mouth/Throat: Oropharynx is clear and moist. No oropharyngeal exudate.  Cardiovascular: Normal rate, regular rhythm and normal heart sounds. Exam reveals no gallop and no friction rub.  No murmur heard.  Pulmonary/Chest: Effort normal and breath sounds normal. No respiratory distress. He has no wheezes.  Abdominal: distended, protuberant abd. Bowel sounds are normal. No hsm. There is no tenderness.  Lymphadenopathy: no cervical adenopathy.  Neurological:  He is alert and oriented to person, place, and time.  Ext: right arm picc line. +2 edema to arms and legs up to mid shin bilaterally Skin: Skin is warm and dry. No rash noted. No erythema.  Psychiatric: He has a normal mood and affect. His behavior is normal.    LABS: Results for orders placed or performed during the hospital encounter of 05/31/14 (from the past 48 hour(s))  Glucose, capillary     Status: Abnormal   Collection Time: 06/05/14 12:21 PM  Result Value Ref Range   Glucose-Capillary 305 (H) 70 - 99 mg/dL  Glucose, capillary     Status: Abnormal   Collection Time: 06/05/14  4:50 PM  Result Value Ref Range   Glucose-Capillary 250 (H) 70 - 99 mg/dL  Glucose, capillary     Status: Abnormal   Collection Time: 06/05/14  9:33 PM  Result Value Ref Range   Glucose-Capillary 200 (H) 70 - 99 mg/dL  CBC     Status: Abnormal   Collection Time: 06/06/14  5:35 AM  Result Value Ref Range   WBC 24.2  (H) 4.0 - 10.5 K/uL   RBC 2.80 (L) 4.22 - 5.81 MIL/uL   Hemoglobin 8.1 (L) 13.0 - 17.0 g/dL   HCT 25.7 (L) 39.0 - 52.0 %   MCV 91.8 78.0 - 100.0 fL   MCH 28.9 26.0 - 34.0 pg   MCHC 31.5 30.0 - 36.0 g/dL   RDW 15.9 (H) 11.5 - 15.5 %   Platelets 313 150 - 400 K/uL  Basic metabolic panel     Status: Abnormal   Collection Time: 06/06/14  5:35 AM  Result Value Ref Range   Sodium 131 (L) 135 - 145 mmol/L    Comment: Please note change in reference range.   Potassium 4.3 3.5 - 5.1 mmol/L    Comment: Please note change in reference range.   Chloride 100 96 - 112 mEq/L   CO2 24 19 - 32 mmol/L   Glucose, Bld 146 (H) 70 - 99 mg/dL   BUN 25 (H) 6 - 23 mg/dL   Creatinine, Ser 1.09 0.50 - 1.35 mg/dL   Calcium 8.0 (L) 8.4 - 10.5 mg/dL   GFR calc non Af Amer 66 (L) >90 mL/min   GFR calc Af Amer 76 (L) >90 mL/min    Comment: (NOTE) The eGFR has been calculated using the CKD EPI equation. This calculation has not been validated in all clinical situations. eGFR's persistently <90 mL/min signify possible Chronic Kidney Disease.    Anion gap 7 5 - 15  Glucose, capillary     Status: Abnormal   Collection Time: 06/06/14  7:44 AM  Result Value Ref Range   Glucose-Capillary 140 (H) 70 - 99 mg/dL  Glucose, capillary     Status: Abnormal   Collection Time: 06/06/14 11:35 AM  Result Value Ref Range   Glucose-Capillary 226 (H) 70 - 99 mg/dL  Glucose, capillary     Status: Abnormal   Collection Time: 06/06/14  4:58 PM  Result Value Ref Range   Glucose-Capillary 271 (H) 70 - 99 mg/dL  Glucose, capillary     Status: Abnormal   Collection Time: 06/06/14  9:45 PM  Result Value Ref Range   Glucose-Capillary 225 (H) 70 - 99 mg/dL  CBC     Status: Abnormal   Collection Time: 06/07/14  4:45 AM  Result Value Ref Range   WBC 23.4 (H) 4.0 - 10.5 K/uL   RBC 2.78 (L) 4.22 -  5.81 MIL/uL   Hemoglobin 8.1 (L) 13.0 - 17.0 g/dL   HCT 25.1 (L) 39.0 - 52.0 %   MCV 90.3 78.0 - 100.0 fL   MCH 29.1 26.0 - 34.0 pg     MCHC 32.3 30.0 - 36.0 g/dL   RDW 15.7 (H) 11.5 - 15.5 %   Platelets 308 150 - 400 K/uL  Comprehensive metabolic panel     Status: Abnormal   Collection Time: 06/07/14  4:45 AM  Result Value Ref Range   Sodium 132 (L) 135 - 145 mmol/L    Comment: Please note change in reference range.   Potassium 4.5 3.5 - 5.1 mmol/L    Comment: Please note change in reference range.   Chloride 98 96 - 112 mEq/L   CO2 26 19 - 32 mmol/L   Glucose, Bld 216 (H) 70 - 99 mg/dL   BUN 25 (H) 6 - 23 mg/dL   Creatinine, Ser 1.11 0.50 - 1.35 mg/dL   Calcium 7.8 (L) 8.4 - 10.5 mg/dL   Total Protein 5.9 (L) 6.0 - 8.3 g/dL   Albumin 1.5 (L) 3.5 - 5.2 g/dL   AST 182 (H) 0 - 37 U/L   ALT 116 (H) 0 - 53 U/L   Alkaline Phosphatase 302 (H) 39 - 117 U/L   Total Bilirubin 1.7 (H) 0.3 - 1.2 mg/dL   GFR calc non Af Amer 64 (L) >90 mL/min   GFR calc Af Amer 75 (L) >90 mL/min    Comment: (NOTE) The eGFR has been calculated using the CKD EPI equation. This calculation has not been validated in all clinical situations. eGFR's persistently <90 mL/min signify possible Chronic Kidney Disease.    Anion gap 8 5 - 15    MICRO: 12/18 cdiff negative 12/17 blood cx ngtd 12/17 urine cx IMAGING: No results found.  HISTORICAL MICRO/IMAGING  Assessment/Plan:  73yo M with newly diagnosed pancreatic cancer having intermittent fevers, no source of infection found, however having obstructive pattern on blood work.   C.difficile = appears that his history is more consistent with colonization rather than active or recent  C.difficile infection. That being said, would avoid using FQs if possible, since high risk associated with causing CDI.  Hx of pcn allergy = patient reports rash as a child. Can likely give cephalosporins safely and re-challenge penicillins while hospitalized  Possible cholangitis = he appears to have obstruction on labs with transaminitis, hyperbili, inc alk phos in setting of known pancreatic mass. Has been  receiving antibiotics.will change to ceftriaxone 2gm iv daily plus metronidazole 539m IV Q 8. Would continue for the next 48hrs to see if getting getting ercp stent placement  odonyphagia = fluconazole can also cause increase lfts, will d/c and switch patient to nystatins swish and swallow and magic mouthwash.  Fever= if has recurrent fever, would get blood culture  Pancreatic cancer = to have radiation next week  Dr. VTommy Medalto provide further recs over the next 4 days  -

## 2014-06-07 NOTE — Progress Notes (Signed)
PT Cancellation Note  Patient Details Name: [name] MRN: [id number] DOB: 01-25-1941   Cancelled Treatment:     Cancel due to Ultrasoud and poss stent placement later today.    Rica Koyanagi  PTA [facility]  Acute  Rehab Pager      [phone]

## 2014-06-07 NOTE — Progress Notes (Signed)
IP PROGRESS NOTE  Subjective:   Events noted over the last few days. He seems to be improving clinically at this time. He is not reporting any nausea or vomiting or abdominal pain. He continues to have intermittent low-grade fevers. He is able to ambulate with a walker without any falls or syncope.  Objective:  Vital signs in last 24 hours: Temp:  [98.2 F (36.8 C)-99.4 F (37.4 C)] 98.8 F (37.1 C) (12/24 0442) Pulse Rate:  [59-103] 103 (12/24 0442) Resp:  [18-20] 20 (12/24 0234) BP: (85-149)/(46-75) 103/75 mmHg (12/24 0442) SpO2:  [93 %-100 %] 93 % (12/24 0442) Weight change:  Last BM Date: 06/06/14  Intake/Output from previous day: 12/23 0701 - 12/24 0700 In: 1320.7 [P.O.:180; I.V.:1140.7] Out: 1475 [Urine:1475]  Mouth: mucous membranes moist, pharynx normal without lesions Resp: clear to auscultation bilaterally Cardio: regular rate and rhythm, S1, S2 normal, no murmur, click, rub or gallop GI: soft, non-tender; bowel sounds normal; no masses,  no organomegaly Extremities: extremities normal, atraumatic, no cyanosis or edema  PICC-without erythema  Lab Results:  Recent Labs  06/06/14 0535 06/07/14 0445  WBC 24.2* 23.4*  HGB 8.1* 8.1*  HCT 25.7* 25.1*  PLT 313 308    BMET  Recent Labs  06/06/14 0535 06/07/14 0445  NA 131* 132*  K 4.3 4.5  CL 100 98  CO2 24 26  GLUCOSE 146* 216*  BUN 25* 25*  CREATININE 1.09 1.11  CALCIUM 8.0* 7.8*    Medications: I have reviewed the patient's current medications.  Assessment/Plan:  73 year old gentleman with the following issues:  1. Fever and leukocytosis: Sepsis workup did not reveal any clear-cut infection at this time. He is on broad-spectrum antibiotics but clinically well. His white cell count is is likely elevated related to malignancy. I agree with infectious disease input and if no clear source of infection identified, I have no objections to transitioning to by mouth antibiotics and discharged in the  near future.  2. Locally advanced pancreatic cancer: He is scheduled to start therapy next week and it is reasonable to think he is on track to do so. He will receive his first radiation therapy on Monday and will add systemic chemotherapy later on that week.  3. IV access: He was scheduled to have a Port-A-Cath insertion but due to possible infection this was delayed. He does have a PICC line and I would ask to keep the PICC line upon discharged to be used for chemotherapy till a Port-A-Cath can be inserted in the future. Please arrange for home care upon his discharge for PICC line flushes.   4. Depression: I agree with using antidepressant and Celexa might be a reasonable option.  This was discussed with the patient and his wife extensively and all her questions were answered today.   LOS: 7 days   Zahlia Deshazer 06/07/2014, 7:59 AM

## 2014-06-07 NOTE — Consult Note (Signed)
Referring Provider: No ref. provider found Primary Care Physician:  [name], MD Primary Gastroenterologist:  Dr. Olevia Perches  Reason for Consultation:  Pancreatic cancer; newly elevated LFT's  HPI: [name] is a 73 y.o. male with past medical hx of DM, CAD, and recent diagnosis of pancreatic cancer in early December in setting of having melena and hematochezia. He underwent endoscopy on 05/12/14 which showed a significant tumor impinging upon the duodenum; biopsies negative for malignancy. CT scan of the chest, abdomen, and pelvis obtained during that hospitalization and showed a mass around the region of the pancreatic head. Local lymphadenopathy was also noted. CT-guided biopsy done on 12/03/2015showed METASTATIC POORLY DIFFERENTIATED CARCINOMA favoring pancreatic cancer.  A PET/CT scan was obtained on 05/29/2014 showed a mass measuring 6.7 x 5.7 cm along the second portion of the duodenum adjacent to the pancreatic head with small hypermetabolic lymph nodes measuring 18 mm.    He was admitted on 12/17 for progressive worsening abdominal pain, worse on the left side and occasionally but not consistently radiating to the right side, 7/10 in severity when present. In ROS, associated with nausea and poor oral intake, several lbs weight loss, fatigue, dyspnea on exertion. He denies any diarrhea, blood in stool, fevers, and chills. He was found to have elevated WBC at 18K that increased to 29K. Labs were otherwise unremarkable, no transaminitis or pattern of obstruction. Infectious work up of blood cx x 1, urine x, and repeat cdiff were all negative. He had repeat abd CT that showed the following:     Pancreas: 6.4 cm mass involving the pancreatic head and descending duodenum. This is not significantly changed in size compared to previous study. Increased hazy opacity is seen surrounding the duodenum and pancreatic head with small amount of fluid seen in Morison's pouch. This may be due to  duodenitis or pancreatitis.   He had intermittent fevers during his hospitalization, on 12/18 101.43F, on 12/19 100.8 and last fever of 101.3 on 12/22. Unclear source that we are treating with exception that on today's lab work, his WBC still elevated at 23K plus hepatic panel now shows signs of obstruction with elevated alp of 300, ast 182, alt 116 and tbili 1.7 (at admit 0.6).  LFT's were completely normal on admission last week.  GI consulted for possible biliary obstruction.  Plans for radiation and chemo to start next week.   Past Medical History  Diagnosis Date  . Diabetes mellitus   . Hyperlipidemia   . Hypertension   . GERD (gastroesophageal reflux disease)   . Coronary artery disease     a. S/P prior PTCA in the 90's;  b. 04/2007 Cath: LM nl, LAD Ca2+ prox, LCX nl, RI nl, OM nl, RCA 40-50p, 40d, EF 60%;  b. 09/2012 Echo: EF 55-60%, mild LVH, nl wall motion, Gr 1 DD, mildly dil LA.  . Right ureteral stone   . SVT (supraventricular tachycardia)     a. s/p ablation per Dr. Lovena Le 10 -72yrs ago  . OSA on CPAP     a. cpap setting of 17  . Arthritis   . Bilateral kidney stones   . Shortness of breath dyspnea   . CKD (chronic kidney disease), stage III   . Pancreatic cancer 05/17/14    Pancreas  . Cancer dx Dec 2015    pancreatic  . Allergy     Past Surgical History  Procedure Laterality Date  . Right ureteroscopic stone extraction  09-26-2010  . Circumcision and fulgeration of  condyloma  06-22-2003  . Cardiac electrophysiology study and ablation  2002  . Transthoracic echocardiogram  03-03-2011    MODERATE LVH/ LVSF NORMAL / EF 60-65%/ MILDLY DILATED LEFT ATRIUMK  . Severeal ureteroscopic stone extractions    . Ureteroscopy  11/13/2011    Procedure: URETEROSCOPY;  Surgeon: Claybon Jabs, MD;  Location: [facility];  Service: Urology;  Laterality: Right;  RIGHT URETEROSCOPY WITH HOLMIUM LASER LIHTOTRIPSY DIGITAL URETEROSCOPE  . Coronary angioplasty  1994     [place] OF THE LAD  . Cardiac catheterization  04-27-2007    CAD WITH 30% NARROWING IN THE MID-LAD/ 50 % NARROWING PROXIMAL CIRCUMFLEX/ 40% NARROWING SECOND MARGINAL BRANCH/ 40-50% PROXIMAL RCA WITH DISTAL POSTERIOR NARROWING/ NORMAL LVF  . Cardiac catheterization  2000    NON-OBSTRUCTIVE CAD  . Laparoscopic cholecystectomy  03-26-2005  . Total knee arthroplasty  07/18/2012    Procedure: TOTAL KNEE ARTHROPLASTY;  Surgeon: Johnn Hai, MD;  Location: [facility];  Service: Orthopedics;  Laterality: Right;  . Esophagogastroduodenoscopy N/A 05/12/2014    Procedure: ESOPHAGOGASTRODUODENOSCOPY (EGD);  Surgeon: Beryle Beams, MD;  Location: [facility] ENDOSCOPY;  Service: Endoscopy;  Laterality: N/A;    Prior to Admission medications   Medication Sig Start Date End Date Taking? Authorizing Provider  aspirin EC 81 MG tablet Take 81 mg by mouth daily.   Yes Historical Provider, MD  atorvastatin (LIPITOR) 20 MG tablet Take 20 mg by mouth at bedtime. ON HOLD   Yes Historical Provider, MD  doxazosin (CARDURA) 2 MG tablet Take 2 mg by mouth daily before breakfast.    Yes Historical Provider, MD  furosemide (LASIX) 20 MG tablet Take 40 mg by mouth every morning.  01/09/14  Yes Burnell Blanks, MD  insulin NPH-regular Human (NOVOLIN 70/30) (70-30) 100 UNIT/ML injection Inject 75-85 Units into the skin 2 (two) times daily with a meal. 75 units in am with breakfast and 85 units in pm with supper   Yes Historical Provider, MD  lisinopril (PRINIVIL,ZESTRIL) 20 MG tablet Take 10 mg by mouth daily.    Yes Historical Provider, MD  metFORMIN (GLUCOPHAGE-XR) 500 MG 24 hr tablet Take 500 mg by mouth daily with supper.  05/01/14  Yes Historical Provider, MD  metoprolol (LOPRESSOR) 100 MG tablet Take 50 mg by mouth 2 (two) times daily. Takes half in am and half tablet in pm   Yes Historical Provider, MD  Multiple Vitamins-Minerals (PRESERVISION AREDS PO) Take 1 tablet by mouth 2 (two) times daily.   Yes Historical Provider, MD    omeprazole (PRILOSEC) 20 MG capsule Take 20 mg by mouth 2 (two) times daily.    Yes Historical Provider, MD  ondansetron (ZOFRAN) 8 MG tablet Take 1 tablet (8 mg total) by mouth every 8 (eight) hours as needed for nausea or vomiting. 05/30/14  Yes Wyatt Portela, MD  potassium chloride SA (K-DUR,KLOR-CON) 20 MEQ tablet Take 1 tablet (20 mEq total) by mouth 2 (two) times daily. Patient taking differently: Take 20 mEq by mouth daily. Takes 20 meq daily 01/09/14  Yes Burnell Blanks, MD  clindamycin (CLEOCIN) 300 MG capsule Take 300 mg by mouth 3 (three) times daily.    Historical Provider, MD  fluorouracil (EFUDEX) 5 % cream Apply 1 application topically 2 (two) times daily.    Historical Provider, MD  lidocaine-prilocaine (EMLA) cream Apply 1 application topically as needed. Patient taking differently: Apply 1 application topically as needed (port access).  05/30/14   Wyatt Portela, MD  LORazepam (ATIVAN) 0.5 MG tablet Take 0.5 mg by mouth every 6 (six) hours as needed for anxiety (nausea).  05/23/14   Historical Provider, MD  Water For Injection Sterile (STERILE WATER, PRESERVATIVE FREE,) injection Give 10 mLs by tube 4 (four) times daily.  05/18/14   Historical Provider, MD    Current Facility-Administered Medications  Medication Dose Route Frequency Provider Last Rate Last Dose  . 0.9 %  sodium chloride infusion   Intravenous Q72H Theodis Blaze, MD 10 mL/hr at 06/06/14 1930    . acetaminophen (TYLENOL) tablet 650 mg  650 mg Oral Q6H PRN Dianne Dun, NP   650 mg at 06/06/14 1738  . ALPRAZolam Duanne Moron) tablet 0.5 mg  0.5 mg Oral TID PRN Theodis Blaze, MD      . citalopram (CELEXA) tablet 20 mg  20 mg Oral Daily Theodis Blaze, MD   20 mg at 06/07/14 0935  . doxazosin (CARDURA) tablet 2 mg  2 mg Oral QAC breakfast Theodis Blaze, MD   2 mg at 06/07/14 6195  . feeding supplement (GLUCERNA SHAKE) (GLUCERNA SHAKE) liquid 237 mL  237 mL Oral BID BM Hazle Coca, RD   237 mL at  06/07/14 0935  . fluconazole (DIFLUCAN) tablet 100 mg  100 mg Oral Daily Theodis Blaze, MD   100 mg at 06/07/14 0935  . [START ON 06/08/2014] furosemide (LASIX) injection 20 mg  20 mg Intravenous Daily Theodis Blaze, MD      . insulin aspart (novoLOG) injection 0-15 Units  0-15 Units Subcutaneous TID WC Robbie Lis, MD   3 Units at 06/07/14 0830  . insulin aspart protamine- aspart (NOVOLOG MIX 70/30) injection 55 Units  55 Units Subcutaneous BID WC Robbie Lis, MD   55 Units at 06/07/14 0739  . magic mouthwash  10 mL Oral QID PRN Theodis Blaze, MD      . morphine 2 MG/ML injection 1 mg  1 mg Intravenous Q2H PRN Theodis Blaze, MD   1 mg at 06/01/14 2054  . ondansetron (ZOFRAN) tablet 4 mg  4 mg Oral Q6H PRN Theodis Blaze, MD       Or  . ondansetron [facility]) injection 4 mg  4 mg Intravenous Q6H PRN Theodis Blaze, MD      . pantoprazole (PROTONIX) EC tablet 40 mg  40 mg Oral Daily Theodis Blaze, MD   40 mg at 06/07/14 0935  . sodium chloride 0.9 % injection 10-40 mL  10-40 mL Intracatheter Q12H Theodis Blaze, MD   10 mL at 06/06/14 2102  . sodium chloride 0.9 % injection 10-40 mL  10-40 mL Intracatheter PRN Theodis Blaze, MD   10 mL at 06/07/14 0445  . sodium chloride 0.9 % injection 10-40 mL  10-40 mL Intracatheter PRN Theodis Blaze, MD   10 mL at 06/06/14 1812  . zolpidem (AMBIEN) tablet 5 mg  5 mg Oral QHS PRN Theodis Blaze, MD   5 mg at 06/06/14 2100    Allergies as of 05/31/2014 - Review Complete 05/31/2014  Allergen Reaction Noted  . Clindamycin/lincomycin Other (See Comments) 05/09/2014  . Codeine Itching 02/06/2010  . Penicillins Rash 02/06/2010    Family History  Problem Relation Age of Onset  . Heart attack Mother   . Heart attack Brother   . Colon cancer Neg Hx   . Stomach cancer Neg Hx     History   Social History  .  Marital Status: Married    Spouse Name: N/A    Number of Children: 15  . Years of Education: N/A   Occupational History  . landscape     owner  business   Social History Main Topics  . Smoking status: Former Smoker -- 1.00 packs/day for 15 years    Types: Cigarettes    Quit date: 06/15/1986  . Smokeless tobacco: Never Used  . Alcohol Use: Yes     Comment: occasional  . Drug Use: No  . Sexual Activity: Not on file   Other Topics Concern  . Not on file   Social History Narrative    Review of Systems: Ten point ROS is O/W negative except as mentioned in HPI.  Physical Exam: Vital signs in last 24 hours: Temp:  [98.2 F (36.8 C)-99.4 F (37.4 C)] 98.8 F (37.1 C) (12/24 0442) Pulse Rate:  [59-103] 103 (12/24 0442) Resp:  [18-20] 20 (12/24 0234) BP: (85-149)/(46-75) 103/75 mmHg (12/24 0442) SpO2:  [93 %-100 %] 93 % (12/24 0442) Weight:  [295 lb (133.811 kg)] 295 lb (133.811 kg) (12/24 0921) Last BM Date: 06/06/14 General:  Alert, Well-developed, well-nourished, pleasant and cooperative in NAD Head:  Normocephalic and atraumatic. Eyes:  Sclera clear, no icterus.  Conjunctiva pink. Ears:  Normal auditory acuity. Mouth:  No deformity or lesions.   Lungs:  Clear throughout to auscultation.  No wheezes, crackles, or rhonchi.  Heart:  Slightly bradycardic; no murmurs, clicks, rubs, or gallops. Abdomen:  Soft, obese, BS present.  Mild epigastric and RUQ TTP without R/R/G.   Rectal:  Deferred  Msk:  Symmetrical without gross deformities. Pulses:  Normal pulses noted. Extremities:  Pitting edema noted in B/L LE's. Neurologic:  Alert and  oriented x4;  grossly normal neurologically. Skin:  Intact without significant lesions or rashes. Psych:  Alert and cooperative. Normal mood and affect.  Intake/Output from previous day: 12/23 0701 - 12/24 0700 In: 1320.7 [P.O.:180; I.V.:1140.7] Out: 1475 [Urine:1475] Intake/Output this shift: Total I/O In: 360 [P.O.:360] Out: -   Lab Results:  Recent Labs  06/05/14 0440 06/06/14 0535 06/07/14 0445  WBC 17.9* 24.2* 23.4*  HGB 8.1* 8.1* 8.1*  HCT 25.2* 25.7* 25.1*  PLT  266 313 308   BMET  Recent Labs  06/05/14 0440 06/06/14 0535 06/07/14 0445  NA 130* 131* 132*  K 4.6 4.3 4.5  CL 98 100 98  CO2 24 24 26   GLUCOSE 222* 146* 216*  BUN 24* 25* 25*  CREATININE 1.07 1.09 1.11  CALCIUM 7.8* 8.0* 7.8*   LFT  Recent Labs  06/07/14 0445  PROT 5.9*  ALBUMIN 1.5*  AST 182*  ALT 116*  ALKPHOS 302*  BILITOT 1.7*   IMPRESSION:  -73 year old male with newly diagnosed pancreatic cancer:  Now with newly elevated LFT's, fevers, and leukocytosis.  Probably has biliary obstruction, although this was not noted on CT scan last week.  PLAN: -Will check STAT abdominal/RUQ ultrasound today to look for biliary dilation.  If positive for such then will proceed with ERCP with stent placement later today. -He is already on antibiotics.   ZEHR, JESSICA D.  06/07/2014, 9:38 AM  Pager number [phone]  GI Attending Note   Chart was reviewed and patient was examined. X-rays and lab were reviewed.    Ultrasound demonstrates significant bile duct obstruction.  I suspect fevers, leukocytosis are from cholangitis.  Plan ERCP/stent placement in am  The risks, benefits, and possible complications of the procedure, including  But  not limited to bleeding, perforation, surgery, and the 5-10% risk for pancreatitis, were explained to the patient.  It was explained that  post ERCP pancreatitis which, while usually is mild, occasionally maybe severe and even life-threatening.  Patient's questions were answered.   Sandy Salaam. Deatra Ina, M.D., [facility] Cell [phone]

## 2014-06-07 NOTE — Progress Notes (Signed)
OT Cancellation Note  Patient Details Name: [name] MRN: [id number] DOB: Aug 10, 1940   Cancelled Treatment:    Reason Eval/Treat Not Completed: Other (comment) Pt states he was up earlier and is currently not feeling well and fatigued after ultrasound. Note possible stent placement later also. Will try back another time.  Jules Schick  [phone] 06/07/2014, 12:41 PM

## 2014-06-08 ENCOUNTER — Inpatient Hospital Stay (HOSPITAL_COMMUNITY): Payer: Medicare Other | Admitting: Anesthesiology

## 2014-06-08 ENCOUNTER — Inpatient Hospital Stay (HOSPITAL_COMMUNITY): Payer: Medicare Other

## 2014-06-08 ENCOUNTER — Encounter (HOSPITAL_COMMUNITY)
Admission: EM | Disposition: A | Discharge status: Skilled Nursing Facility | Payer: Self-pay | Source: Home / Self Care | Attending: Internal Medicine

## 2014-06-08 ENCOUNTER — Encounter (HOSPITAL_COMMUNITY): Payer: Self-pay | Admitting: Certified Registered"

## 2014-06-08 HISTORY — PX: ERCP: SHX5425

## 2014-06-08 LAB — GLUCOSE, CAPILLARY
GLUCOSE-CAPILLARY: 308 mg/dL — AB (ref 70–99)
Glucose-Capillary: 210 mg/dL — ABNORMAL HIGH (ref 70–99)
Glucose-Capillary: 212 mg/dL — ABNORMAL HIGH (ref 70–99)
Glucose-Capillary: 228 mg/dL — ABNORMAL HIGH (ref 70–99)
Glucose-Capillary: 283 mg/dL — ABNORMAL HIGH (ref 70–99)

## 2014-06-08 SURGERY — ERCP, WITH INTERVENTION IF INDICATED
Anesthesia: Monitor Anesthesia Care | Laterality: Left

## 2014-06-08 SURGERY — ERCP, WITH INTERVENTION IF INDICATED
Anesthesia: General

## 2014-06-08 MED ORDER — MEPERIDINE HCL 50 MG/ML IJ SOLN: 6.2500 mg | INTRAMUSCULAR | Status: DC | PRN

## 2014-06-08 MED ORDER — PROMETHAZINE HCL 25 MG/ML IJ SOLN: 6.2500 mg | INTRAMUSCULAR | Status: DC | PRN

## 2014-06-08 MED ORDER — SUCCINYLCHOLINE CHLORIDE 20 MG/ML IJ SOLN
INTRAMUSCULAR | Status: DC | PRN
  Administered 2014-06-08: 160 mg via INTRAVENOUS

## 2014-06-08 MED ORDER — LIDOCAINE HCL (CARDIAC) 20 MG/ML IV SOLN
INTRAVENOUS | Status: AC
  Filled 2014-06-08: qty 5

## 2014-06-08 MED ORDER — INDOMETHACIN 50 MG RE SUPP
100.0000 mg | Freq: Once | RECTAL | Status: AC
  Administered 2014-06-08: 100 mg via RECTAL
  Filled 2014-06-08: qty 2

## 2014-06-08 MED ORDER — INDOMETHACIN 50 MG RE SUPP
50.0000 mg | Freq: Once | RECTAL | Status: DC
  Filled 2014-06-08: qty 1

## 2014-06-08 MED ORDER — ONDANSETRON HCL 4 MG/2ML IJ SOLN
INTRAMUSCULAR | Status: AC
  Filled 2014-06-08: qty 2

## 2014-06-08 MED ORDER — GLUCAGON HCL RDNA (DIAGNOSTIC) 1 MG IJ SOLR
INTRAMUSCULAR | Status: DC | PRN
  Administered 2014-06-08: 1 mg via INTRAVENOUS

## 2014-06-08 MED ORDER — LIDOCAINE HCL (CARDIAC) 20 MG/ML IV SOLN
INTRAVENOUS | Status: DC | PRN
  Administered 2014-06-08: 50 mg via INTRAVENOUS

## 2014-06-08 MED ORDER — FENTANYL CITRATE 0.05 MG/ML IJ SOLN: 25.0000 ug | INTRAMUSCULAR | Status: DC | PRN

## 2014-06-08 MED ORDER — MIDAZOLAM HCL 5 MG/5ML IJ SOLN
INTRAMUSCULAR | Status: DC | PRN
  Administered 2014-06-08: 2 mg via INTRAVENOUS

## 2014-06-08 MED ORDER — PROPOFOL 10 MG/ML IV BOLUS
INTRAVENOUS | Status: AC
  Filled 2014-06-08: qty 20

## 2014-06-08 MED ORDER — FENTANYL CITRATE 0.05 MG/ML IJ SOLN
INTRAMUSCULAR | Status: DC | PRN
  Administered 2014-06-08 (×4): 50 ug via INTRAVENOUS

## 2014-06-08 MED ORDER — ONDANSETRON HCL 4 MG/2ML IJ SOLN
INTRAMUSCULAR | Status: DC | PRN
  Administered 2014-06-08: 4 mg via INTRAVENOUS

## 2014-06-08 MED ORDER — GLUCAGON HCL RDNA (DIAGNOSTIC) 1 MG IJ SOLR
INTRAMUSCULAR | Status: AC
  Filled 2014-06-08: qty 1

## 2014-06-08 MED ORDER — PROPOFOL 10 MG/ML IV BOLUS
INTRAVENOUS | Status: DC | PRN
  Administered 2014-06-08: 160 mg via INTRAVENOUS

## 2014-06-08 MED ORDER — MIDAZOLAM HCL 2 MG/2ML IJ SOLN
INTRAMUSCULAR | Status: AC
Start: 2014-06-08 — End: 2014-06-08
  Filled 2014-06-08: qty 2

## 2014-06-08 MED ORDER — FENTANYL CITRATE 0.05 MG/ML IJ SOLN
INTRAMUSCULAR | Status: AC
  Filled 2014-06-08: qty 2

## 2014-06-08 NOTE — Interval H&P Note (Signed)
History and Physical Interval Note:  06/08/2014 11:41 AM  [name]  has presented today for surgery, with the diagnosis of biliary obstruction,pancreatic cancer  The various methods of treatment have been discussed with the patient and family. After consideration of risks, benefits and other options for treatment, the patient has consented to  Procedure(s): ENDOSCOPIC RETROGRADE CHOLANGIOPANCREATOGRAPHY (ERCP) (N/A) as a surgical intervention .  The patient's history has been reviewed, patient examined, no change in status, stable for surgery.  I have reviewed the patient's chart and labs.  Questions were answered to the patient's satisfaction.    The recent H&P (dated *06/07/14**) was reviewed, the patient was examined and there is no change in the patients condition since that H&P was completed.   Erskine Emery  06/08/2014, 11:41 AM    Erskine Emery

## 2014-06-08 NOTE — Addendum Note (Signed)
Addendum  created 06/08/14 1449 by Lyn Hollingshead, MD   Modules edited: Clinical Notes   Clinical Notes:  File: [id number]

## 2014-06-08 NOTE — Anesthesia Postprocedure Evaluation (Signed)
Anesthesia Post Note  Patient: [name]  Procedure(s) Performed: Procedure(s) (LRB): ENDOSCOPIC RETROGRADE CHOLANGIOPANCREATOGRAPHY (ERCP) (N/A)  Anesthesia type: General  Patient location: PACU  Post pain: Pain level controlled  Post assessment: Post-op Vital signs reviewed  Last Vitals:  Filed Vitals:   06/08/14 1245  BP: 126/57  Pulse: 110  Temp: 36.7 C  Resp: 23    Post vital signs: Reviewed  Level of consciousness: sedated  Complications: No apparent anesthesia complications

## 2014-06-08 NOTE — Progress Notes (Signed)
Dr. Jillyn Hidden mad aware of patient's CBG RESULTS  In PACU- 212- no Insulin coverage in PACU at this time- also made aware of patient's EKG READINGS in PACU- HEART RATE 117-122

## 2014-06-08 NOTE — Progress Notes (Signed)
Patient ID: [name], male   DOB: 05/10/1941, 73 y.o.   MRN: [id number] Contacted by Dr. Deatra Ina.  The patient's chart and images have been reviewed.  Will plan for PTC and possible rendez-vous procedure on 06/09/14.  Check labs in am.

## 2014-06-08 NOTE — Progress Notes (Signed)
Patient ID: [name], male   DOB: [birth year], 73 y.o.   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 Alen Blew), recently hospitalized for C. Diff and still on oral Vancomycin, now presented to [facility] ED with main concern of several days duration of progressively worsening abdominal pain, intermittent in nature and throbbing, worse on the left side and occasionally but not consistently radiating to the right side, 7/10 in severity when present, with no specific alleviating or aggravating factors. This has been associated with nausea and poor oral intake, several lbs weight loss, fatigue, dyspnea on exertion. He denies any diarrhea or constipation, no blood in stool, no specific urinary concerns, no blood in urine, no fevers, chills,no chest pain.   In ED, pt noted to be in mild distress due to pain. VS notable for BP 85/43 mmHg, HR 113 bpm, RR 22 bpm, T 99.68F. Blood work notable for WBC > 30K, Hg 7.9 (repeat Hg 8.5, prior to transfusion). CT abd with ? Duodenitis. TRH asked to admit for further evaluation and management of sepsis of unclear etiology. SDU requested. 2 U PRBC requested.   Assessment and Plan: Active Problems: Sepsis - criteria met on admission with tachycardia, leukocytosis, fever, RR 22, hypotension  - now since 12/25 with elevated LFT's ? Cholangitis possible source of persistent fever and leukocytosis  - continued broad spectrum ABX Vanc and Aztreonam for 5 days, transitioned to Rocephin and Flagyl 12/24 - LFT's elevated 12/24, worrisome for obstruction at the bile duct given underlying pancreatic cancer - ID consulted for ABX management, appreciate assistance  ? Ascending cholangitis with CBD stricture - per GI, unable to do ERCP due to tumor  invading second portion of the duodenum - plan for PCT in AM - CMET in AM Diastolic CHF, acute on chronic  - with significant lower and upper extremity pitting edema - in the setting of hypoalbuminemia, not cardiac issue  - weight on admission 306 lbs --> up to 311 lbs --> 303 lbs --> 295 lbs this AM - placed on Lasix 40 mg IV QD 12/18 and increased frequency to BID on 12/20 to improve diuresis  - since weight is trending down, will change Lasix to 20 mg IV QD, pt still with significant edema  - monitor weight, I's and O's Acute on chronic blood loss anemia, of pancreatic cancer, DM - review of records indicate that Hg is ~9 at baseline - pt denies any blood in stool or urine, FOBT is negative  - cancer is possible source of bleed - repeat Hg 12/17 was as low as 6.3, pt has received 2 U PRBC 12/17 and with appropriate increase in Hg 8.6 - Hg now remains stable with no signs of active bleeding  - IR consulted for port-a-cath placement but this will be on hold until infection resolves Leukocytosis - ? Cholangitis, ? Biliary duct obstruction  - oral Vanc for C. Diff discontinued (pt started on Dec 4th, 2015), completed Dec 19th, 2015 DM, type II, with complications of anemia and CKD II - A1C 8.8  - continue low dose Lantus for now until oral intake improves - hold Metformin and place on SSI for now  HTN - holding home medical regimen Metoprolol and Lisinopril - continuing Lasix in an attempt to  improve diuresis  RUE edema > LUE, LE edema bilateral  - doppler scan negative for DVT  - edema improving  - continue Lasix as noted above  HLD - on statin  CKD stage II - Cr is now stable and WNL Pancreatic cancer - appreciate Dr. 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  Family Communication: plan of care discussed with the patient Disposition Plan: Remains inpatient   IV access:   Peripheral IV Procedures and diagnostic studies:    Ct Abdomen Pelvis W Contrast 05/31/2014 Stable 6 cm mass involving the pancreatic head and duodenum, consistent with known pancreatic carcinoma. No evidence of obstruction. Stable mild peripancreatic lymphadenopathy, consistent metastatic disease. Increased hazy opacity surrounding the pancreatic head and duodenum and small amount of fluid within Morison's pouch. This may be related to pancreatitis or duodenitis. Bilateral nonobstructive nephrolithiasis and diverticulosis incidentally noted.   Nm Pet Image Initial (pi) Skull Base To Thigh 05/29/2014 1. Mass along the C-loop of the duodenum adjacent to pancreatic head is intensely hypermetabolic consistent with carcinoma. 2. Adjacent small peripancreatic hypermetabolic lymph node. 3. No additional hypermetabolic adenopathy or metastatic disease.   Dg Abd Acute W/ches 05/31/2014 Unremarkable bowel gas pattern. No obstruction or free air. Left base atelectasis. No lung edema or consolidation.   US Abdomen Limited  06/07/2014  Dilatation of the proximal common bowel duct, with mild central intrahepatic biliary ductal dilatation. This appears grossly stable compared to prior CT. Postcholecystectomy. 2. Heterogeneous hepatic parenchyma without focal lesion. 3. The small amount of ascites on prior CT is not seen. Medical Consultants:   Oncology Dr. Alen Blew   IR for port-a-cath placement  Cardiology for management of anasarca   GI  ID Other Consultants:   None  IAnti-Infectives:    Vancomycin 12/17 --> 12/21  Aztreonam 12/17 --> 12/21  Cipro 12/21 --> 12/24  Rocephin 12/24  -->  Flagyl 12/24 -->  [name], MD  [facility] Pager [phone]  If 7PM-7AM, please contact night-coverage [email or web address] Password [facility] 06/08/2014, 3:44 PM   LOS: 8 days   HPI/Subjective: No events overnight.   Objective: Filed Vitals:   06/08/14 1230 06/08/14 1245 06/08/14 1312 06/08/14 1319  BP: 130/73 126/57 126/54 124/54  Pulse: 112 110 109 109  Temp:  98 F (36.7 C) 98.6 F (37 C) 98.6 F (37 C)  TempSrc:    Oral  Resp: _0 Height:      Weight:      SpO2: 98% 95% 95% 95%    Intake/Output Summary (Last 24 hours) at 06/08/14 1544 Last data filed at 06/08/14 1245  Gross per 24 hour  Intake   1500 ml  Output      0 ml  Net   1500 ml    Exam:   General:  Pt is alert, follows commands appropriately, not in acute distress  Cardiovascular: Regular rhythm, tachycardic, S1/S2, no murmurs, no rubs, no gallops  Respiratory: Clear to auscultation bilaterally, no wheezing, diminished breath sounds at bases   Abdomen: Soft, non tender, slightly distended, bowel sounds present, no guarding   Data Reviewed: Basic Metabolic Panel:  Recent Labs Lab 06/03/14  5320 06/03/14 0157 06/04/14 0417 06/05/14 0440 06/06/14 0535 06/07/14 0445  NA 132*  --  131* 130* 131* 132*  K 4.4  --  4.7 4.6 4.3 4.5  CL 96  --  96 98 100 98  CO2 22  --  _0 GLUCOSE 192*  --  187* 222* 146* 216*  BUN 20  --  22 24* 25* 25*  CREATININE 1.08  --  1.07 1.07 1.09 1.11  CALCIUM 8.3*  --  8.5 7.8* 8.0* 7.8*  MG  --  1.6  --   --   --   --    Liver Function Tests:  Recent Labs Lab 06/07/14 0445  AST 182*  ALT 116*  ALKPHOS 302*  BILITOT 1.7*  PROT 5.9*  ALBUMIN 1.5*   CBC:  Recent Labs Lab 06/03/14 0156 06/04/14 0417 06/05/14 0440 06/06/14 0535 06/07/14 0445  WBC 29.5* 25.0* 17.9* 24.2* 23.4*  NEUTROABS  --  20.3*  --   --   --   HGB 8.8* 8.3* 8.1* 8.1* 8.1*  HCT 27.3* 26.1* 25.2* 25.7* 25.1*  MCV 91.3 92.9 91.6 91.8 90.3  PLT 267 286 266 313  308   CBG:  Recent Labs Lab 06/07/14 1649 06/07/14 2043 06/08/14 0734 06/08/14 1215 06/08/14 1312  GLUCAP 285* 206* 210* 212* 228*    Recent Results (from the past 240 hour(s))  MRSA PCR Screening     Status: None   Collection Time: 05/31/14  4:25 PM  Result Value Ref Range Status   MRSA by PCR NEGATIVE NEGATIVE Final    Comment:        The GeneXpert MRSA Assay (FDA approved for NASAL specimens only), is one component of a comprehensive MRSA colonization surveillance program. It is not intended to diagnose MRSA infection nor to guide or monitor treatment for MRSA infections.   Urine culture     Status: None   Collection Time: 05/31/14  9:15 PM  Result Value Ref Range Status   Specimen Description URINE, RANDOM  Final   Special Requests NONE  Final   Culture  Setup Time   Final    06/01/2014 01:24 Performed at [place]   Final    4,000 COLONIES/ML Performed at [facility]    Culture   Final    INSIGNIFICANT GROWTH Performed at [facility]    Report Status 06/01/2014 FINAL  Final  Culture, blood (routine x 2)     Status: None   Collection Time: 05/31/14 10:59 PM  Result Value Ref Range Status   Specimen Description BLOOD PICC  Final   Special Requests BOTTLES DRAWN AEROBIC ONLY 3ML  Final   Culture  Setup Time   Final    06/01/2014 04:41 Performed at [place]   Final    NO GROWTH 5 DAYS Performed at [facility]    Report Status 06/07/2014 FINAL  Final  Clostridium Difficile by PCR     Status: None   Collection Time: 06/01/14  6:20 AM  Result Value Ref Range Status   C difficile by pcr NEGATIVE NEGATIVE Final    Comment: Performed at [facility]     Scheduled Meds: . cefTRIAXone  IV  2 g Intravenous Q24H  . citalopram  20 mg Oral Daily  . doxazosin  2 mg Oral QAC breakfast  . furosemide  20 mg Intravenous Daily  . insulin aspart  0-15 Units Subcutaneous TID WC   . metronidazole  500 mg Intravenous Q8H  . nystatin  5 mL Oral QID  . pantoprazole  40 mg Oral Daily   Continuous Infusions:

## 2014-06-08 NOTE — Op Note (Signed)
[facility] [place], [id number]   ERCP PROCEDURE REPORT        EXAM DATE: 06-14-14  PATIENT NAME:          [name], [name]          MR #: [id number] BIRTHDATE:       [birth year]     VISIT #:     [id number] ATTENDING:     Inda Castle, MD     STATUS:     inpatient ASSISTANT:      Cleda Daub and Cristopher Estimable  INDICATIONS:  The patient is a 73 yr old male here for an ERCP due to suspected ascending cholangitis and common bile duct stricture.  PROCEDURE PERFORMED:     ERCP, diagnostic   (incomplete) MEDICATIONS:     Per Anesthesia  CONSENT: The patient understands the risks and benefits of the procedure and understands that these risks include, but are not limited to: sedation, allergic reaction, infection, perforation and/or bleeding. Alternative means of evaluation and treatment include, among others: physical exam, x-rays, and/or surgical intervention. The patient elects to proceed with this endoscopic procedure.  DESCRIPTION OF PROCEDURE: During intra-op preparation period all mechanical & medical equipment was checked for proper function. Hand hygiene and appropriate measures for infection prevention was taken. After the risks, benefits and alternatives of the procedure were thoroughly explained, Informed was verified, confirmed and timeout was successfully executed by the treatment team. With the patient in left semi-prone position, medications were administered intravenously.The Pentax Ercp Scope [id number] was passed from the mouth into the esophagus and further advanced from the esophagus into the stomach. From stomach scope was directed to the second portion of the duodenum.  . The scope position was confirmed fluoroscopically. Rest of the findings/therapeutics are given below. The scope was then completely withdrawn from the patient and the procedure completed. The pulse, BP, and O2 saturation were monitored and documented by the  physician and the nursing staff throughout the entire procedure. The patient was cared for as planned according to standard protocol. The patient was then discharged to recovery in stable condition and with appropriate post procedure care.  Coursing along the medial wall of the second portion the duodenum were large tumor masses completely obscuring the papilla.  Because was friable and bleeding.  Multiple blind attempts were made to pass a wire into the ducts but were unsuccessful.  In addition, the scope could not be passed beyond the second portion of the duodenum because of partial obstruction.    ADVERSE EVENT: IMPRESSIONS:     Co  Tumor masses obscuring papilla and causing partial duodenal obstruction  RECOMMENDATIONS:     PTC REPEAT EXAM:   ___________________________________ Inda Castle, MD eSigned:  Inda Castle, MD 06/14/2014 2:03 PM   cc:  CPT CODES: ICD9 CODES:  The ICD and CPT codes recommended by this software are interpretations from the data that the clinical staff has captured with the software.  The verification of the translation of this report to the ICD and CPT codes and modifiers is the sole responsibility of the health care institution and practicing physician where this report was generated.  [employer] will not be held responsible for the validity of the ICD and CPT codes included on this report.  AMA assumes no liability for data contained or not contained herein. CPT is a Designer, television/film set of the [employer].   PATIENT NAME:  [name], [name]  MR#: [id number]

## 2014-06-08 NOTE — Progress Notes (Signed)
Patient self administered CPAP 

## 2014-06-08 NOTE — H&P (View-Only) (Signed)
Referring Provider: No ref. provider found Primary Care Physician:  [name], MD Primary Gastroenterologist:  Dr. Olevia Perches  Reason for Consultation:  Pancreatic cancer; newly elevated LFT's  HPI: [name] is a 73 y.o. male with past medical hx of DM, CAD, and recent diagnosis of pancreatic cancer in early December in setting of having melena and hematochezia. He underwent endoscopy on 05/12/14 which showed a significant tumor impinging upon the duodenum; biopsies negative for malignancy. CT scan of the chest, abdomen, and pelvis obtained during that hospitalization and showed a mass around the region of the pancreatic head. Local lymphadenopathy was also noted. CT-guided biopsy done on 12/03/2015showed METASTATIC POORLY DIFFERENTIATED CARCINOMA favoring pancreatic cancer.  A PET/CT scan was obtained on 05/29/2014 showed a mass measuring 6.7 x 5.7 cm along the second portion of the duodenum adjacent to the pancreatic head with small hypermetabolic lymph nodes measuring 18 mm.    He was admitted on 12/17 for progressive worsening abdominal pain, worse on the left side and occasionally but not consistently radiating to the right side, 7/10 in severity when present. In ROS, associated with nausea and poor oral intake, several lbs weight loss, fatigue, dyspnea on exertion. He denies any diarrhea, blood in stool, fevers, and chills. He was found to have elevated WBC at 18K that increased to 29K. Labs were otherwise unremarkable, no transaminitis or pattern of obstruction. Infectious work up of blood cx x 1, urine x, and repeat cdiff were all negative. He had repeat abd CT that showed the following:     Pancreas: 6.4 cm mass involving the pancreatic head and descending duodenum. This is not significantly changed in size compared to previous study. Increased hazy opacity is seen surrounding the duodenum and pancreatic head with small amount of fluid seen in Morison's pouch. This may be due to  duodenitis or pancreatitis.   He had intermittent fevers during his hospitalization, on 12/18 101.73F, on 12/19 100.8 and last fever of 101.3 on 12/22. Unclear source that we are treating with exception that on today's lab work, his WBC still elevated at 23K plus hepatic panel now shows signs of obstruction with elevated alp of 300, ast 182, alt 116 and tbili 1.7 (at admit 0.6).  LFT's were completely normal on admission last week.  GI consulted for possible biliary obstruction.  Plans for radiation and chemo to start next week.   Past Medical History  Diagnosis Date  . Diabetes mellitus   . Hyperlipidemia   . Hypertension   . GERD (gastroesophageal reflux disease)   . Coronary artery disease     a. S/P prior PTCA in the 90's;  b. 04/2007 Cath: LM nl, LAD Ca2+ prox, LCX nl, RI nl, OM nl, RCA 40-50p, 40d, EF 60%;  b. 09/2012 Echo: EF 55-60%, mild LVH, nl wall motion, Gr 1 DD, mildly dil LA.  . Right ureteral stone   . SVT (supraventricular tachycardia)     a. s/p ablation per Dr. Lovena Le 10 -26yrs ago  . OSA on CPAP     a. cpap setting of 17  . Arthritis   . Bilateral kidney stones   . Shortness of breath dyspnea   . CKD (chronic kidney disease), stage III   . Pancreatic cancer 05/17/14    Pancreas  . Cancer dx Dec 2015    pancreatic  . Allergy     Past Surgical History  Procedure Laterality Date  . Right ureteroscopic stone extraction  09-26-2010  . Circumcision and fulgeration of  condyloma  06-22-2003  . Cardiac electrophysiology study and ablation  2002  . Transthoracic echocardiogram  03-03-2011    MODERATE LVH/ LVSF NORMAL / EF 60-65%/ MILDLY DILATED LEFT ATRIUMK  . Severeal ureteroscopic stone extractions    . Ureteroscopy  11/13/2011    Procedure: URETEROSCOPY;  Surgeon: Claybon Jabs, MD;  Location: [facility];  Service: Urology;  Laterality: Right;  RIGHT URETEROSCOPY WITH HOLMIUM LASER LIHTOTRIPSY DIGITAL URETEROSCOPE  . Coronary angioplasty  1994     [place] OF THE LAD  . Cardiac catheterization  04-27-2007    CAD WITH 30% NARROWING IN THE MID-LAD/ 50 % NARROWING PROXIMAL CIRCUMFLEX/ 40% NARROWING SECOND MARGINAL BRANCH/ 40-50% PROXIMAL RCA WITH DISTAL POSTERIOR NARROWING/ NORMAL LVF  . Cardiac catheterization  2000    NON-OBSTRUCTIVE CAD  . Laparoscopic cholecystectomy  03-26-2005  . Total knee arthroplasty  07/18/2012    Procedure: TOTAL KNEE ARTHROPLASTY;  Surgeon: Johnn Hai, MD;  Location: [facility];  Service: Orthopedics;  Laterality: Right;  . Esophagogastroduodenoscopy N/A 05/12/2014    Procedure: ESOPHAGOGASTRODUODENOSCOPY (EGD);  Surgeon: Beryle Beams, MD;  Location: [facility] ENDOSCOPY;  Service: Endoscopy;  Laterality: N/A;    Prior to Admission medications   Medication Sig Start Date End Date Taking? Authorizing Provider  aspirin EC 81 MG tablet Take 81 mg by mouth daily.   Yes Historical Provider, MD  atorvastatin (LIPITOR) 20 MG tablet Take 20 mg by mouth at bedtime. ON HOLD   Yes Historical Provider, MD  doxazosin (CARDURA) 2 MG tablet Take 2 mg by mouth daily before breakfast.    Yes Historical Provider, MD  furosemide (LASIX) 20 MG tablet Take 40 mg by mouth every morning.  01/09/14  Yes Burnell Blanks, MD  insulin NPH-regular Human (NOVOLIN 70/30) (70-30) 100 UNIT/ML injection Inject 75-85 Units into the skin 2 (two) times daily with a meal. 75 units in am with breakfast and 85 units in pm with supper   Yes Historical Provider, MD  lisinopril (PRINIVIL,ZESTRIL) 20 MG tablet Take 10 mg by mouth daily.    Yes Historical Provider, MD  metFORMIN (GLUCOPHAGE-XR) 500 MG 24 hr tablet Take 500 mg by mouth daily with supper.  05/01/14  Yes Historical Provider, MD  metoprolol (LOPRESSOR) 100 MG tablet Take 50 mg by mouth 2 (two) times daily. Takes half in am and half tablet in pm   Yes Historical Provider, MD  Multiple Vitamins-Minerals (PRESERVISION AREDS PO) Take 1 tablet by mouth 2 (two) times daily.   Yes Historical Provider, MD    omeprazole (PRILOSEC) 20 MG capsule Take 20 mg by mouth 2 (two) times daily.    Yes Historical Provider, MD  ondansetron (ZOFRAN) 8 MG tablet Take 1 tablet (8 mg total) by mouth every 8 (eight) hours as needed for nausea or vomiting. 05/30/14  Yes Wyatt Portela, MD  potassium chloride SA (K-DUR,KLOR-CON) 20 MEQ tablet Take 1 tablet (20 mEq total) by mouth 2 (two) times daily. Patient taking differently: Take 20 mEq by mouth daily. Takes 20 meq daily 01/09/14  Yes Burnell Blanks, MD  clindamycin (CLEOCIN) 300 MG capsule Take 300 mg by mouth 3 (three) times daily.    Historical Provider, MD  fluorouracil (EFUDEX) 5 % cream Apply 1 application topically 2 (two) times daily.    Historical Provider, MD  lidocaine-prilocaine (EMLA) cream Apply 1 application topically as needed. Patient taking differently: Apply 1 application topically as needed (port access).  05/30/14   Wyatt Portela, MD  LORazepam (ATIVAN) 0.5 MG tablet Take 0.5 mg by mouth every 6 (six) hours as needed for anxiety (nausea).  05/23/14   Historical Provider, MD  Water For Injection Sterile (STERILE WATER, PRESERVATIVE FREE,) injection Give 10 mLs by tube 4 (four) times daily.  05/18/14   Historical Provider, MD    Current Facility-Administered Medications  Medication Dose Route Frequency Provider Last Rate Last Dose  . 0.9 %  sodium chloride infusion   Intravenous Q72H Theodis Blaze, MD 10 mL/hr at 06/06/14 1930    . acetaminophen (TYLENOL) tablet 650 mg  650 mg Oral Q6H PRN Dianne Dun, NP   650 mg at 06/06/14 1738  . ALPRAZolam Duanne Moron) tablet 0.5 mg  0.5 mg Oral TID PRN Theodis Blaze, MD      . citalopram (CELEXA) tablet 20 mg  20 mg Oral Daily Theodis Blaze, MD   20 mg at 06/07/14 0935  . doxazosin (CARDURA) tablet 2 mg  2 mg Oral QAC breakfast Theodis Blaze, MD   2 mg at 06/07/14 2694  . feeding supplement (GLUCERNA SHAKE) (GLUCERNA SHAKE) liquid 237 mL  237 mL Oral BID BM Hazle Coca, RD   237 mL at  06/07/14 0935  . fluconazole (DIFLUCAN) tablet 100 mg  100 mg Oral Daily Theodis Blaze, MD   100 mg at 06/07/14 0935  . [START ON 06/08/2014] furosemide (LASIX) injection 20 mg  20 mg Intravenous Daily Theodis Blaze, MD      . insulin aspart (novoLOG) injection 0-15 Units  0-15 Units Subcutaneous TID WC Robbie Lis, MD   3 Units at 06/07/14 0830  . insulin aspart protamine- aspart (NOVOLOG MIX 70/30) injection 55 Units  55 Units Subcutaneous BID WC Robbie Lis, MD   55 Units at 06/07/14 0739  . magic mouthwash  10 mL Oral QID PRN Theodis Blaze, MD      . morphine 2 MG/ML injection 1 mg  1 mg Intravenous Q2H PRN Theodis Blaze, MD   1 mg at 06/01/14 2054  . ondansetron (ZOFRAN) tablet 4 mg  4 mg Oral Q6H PRN Theodis Blaze, MD       Or  . ondansetron [facility]) injection 4 mg  4 mg Intravenous Q6H PRN Theodis Blaze, MD      . pantoprazole (PROTONIX) EC tablet 40 mg  40 mg Oral Daily Theodis Blaze, MD   40 mg at 06/07/14 0935  . sodium chloride 0.9 % injection 10-40 mL  10-40 mL Intracatheter Q12H Theodis Blaze, MD   10 mL at 06/06/14 2102  . sodium chloride 0.9 % injection 10-40 mL  10-40 mL Intracatheter PRN Theodis Blaze, MD   10 mL at 06/07/14 0445  . sodium chloride 0.9 % injection 10-40 mL  10-40 mL Intracatheter PRN Theodis Blaze, MD   10 mL at 06/06/14 1812  . zolpidem (AMBIEN) tablet 5 mg  5 mg Oral QHS PRN Theodis Blaze, MD   5 mg at 06/06/14 2100    Allergies as of 05/31/2014 - Review Complete 05/31/2014  Allergen Reaction Noted  . Clindamycin/lincomycin Other (See Comments) 05/09/2014  . Codeine Itching 02/06/2010  . Penicillins Rash 02/06/2010    Family History  Problem Relation Age of Onset  . Heart attack Mother   . Heart attack Brother   . Colon cancer Neg Hx   . Stomach cancer Neg Hx     History   Social History  .  Marital Status: Married    Spouse Name: N/A    Number of Children: 16  . Years of Education: N/A   Occupational History  . landscape     owner  business   Social History Main Topics  . Smoking status: Former Smoker -- 1.00 packs/day for 15 years    Types: Cigarettes    Quit date: 06/15/1986  . Smokeless tobacco: Never Used  . Alcohol Use: Yes     Comment: occasional  . Drug Use: No  . Sexual Activity: Not on file   Other Topics Concern  . Not on file   Social History Narrative    Review of Systems: Ten point ROS is O/W negative except as mentioned in HPI.  Physical Exam: Vital signs in last 24 hours: Temp:  [98.2 F (36.8 C)-99.4 F (37.4 C)] 98.8 F (37.1 C) (12/24 0442) Pulse Rate:  [59-103] 103 (12/24 0442) Resp:  [18-20] 20 (12/24 0234) BP: (85-149)/(46-75) 103/75 mmHg (12/24 0442) SpO2:  [93 %-100 %] 93 % (12/24 0442) Weight:  [295 lb (133.811 kg)] 295 lb (133.811 kg) (12/24 0921) Last BM Date: 06/06/14 General:  Alert, Well-developed, well-nourished, pleasant and cooperative in NAD Head:  Normocephalic and atraumatic. Eyes:  Sclera clear, no icterus.  Conjunctiva pink. Ears:  Normal auditory acuity. Mouth:  No deformity or lesions.   Lungs:  Clear throughout to auscultation.  No wheezes, crackles, or rhonchi.  Heart:  Slightly bradycardic; no murmurs, clicks, rubs, or gallops. Abdomen:  Soft, obese, BS present.  Mild epigastric and RUQ TTP without R/R/G.   Rectal:  Deferred  Msk:  Symmetrical without gross deformities. Pulses:  Normal pulses noted. Extremities:  Pitting edema noted in B/L LE's. Neurologic:  Alert and  oriented x4;  grossly normal neurologically. Skin:  Intact without significant lesions or rashes. Psych:  Alert and cooperative. Normal mood and affect.  Intake/Output from previous day: 12/23 0701 - 12/24 0700 In: 1320.7 [P.O.:180; I.V.:1140.7] Out: 1475 [Urine:1475] Intake/Output this shift: Total I/O In: 360 [P.O.:360] Out: -   Lab Results:  Recent Labs  06/05/14 0440 06/06/14 0535 06/07/14 0445  WBC 17.9* 24.2* 23.4*  HGB 8.1* 8.1* 8.1*  HCT 25.2* 25.7* 25.1*  PLT  266 313 308   BMET  Recent Labs  06/05/14 0440 06/06/14 0535 06/07/14 0445  NA 130* 131* 132*  K 4.6 4.3 4.5  CL 98 100 98  CO2 24 24 26   GLUCOSE 222* 146* 216*  BUN 24* 25* 25*  CREATININE 1.07 1.09 1.11  CALCIUM 7.8* 8.0* 7.8*   LFT  Recent Labs  06/07/14 0445  PROT 5.9*  ALBUMIN 1.5*  AST 182*  ALT 116*  ALKPHOS 302*  BILITOT 1.7*   IMPRESSION:  -73 year old male with newly diagnosed pancreatic cancer:  Now with newly elevated LFT's, fevers, and leukocytosis.  Probably has biliary obstruction, although this was not noted on CT scan last week.  PLAN: -Will check STAT abdominal/RUQ ultrasound today to look for biliary dilation.  If positive for such then will proceed with ERCP with stent placement later today. -He is already on antibiotics.   ZEHR, JESSICA D.  06/07/2014, 9:38 AM  Pager number [phone]  GI Attending Note   Chart was reviewed and patient was examined. X-rays and lab were reviewed.    Ultrasound demonstrates significant bile duct obstruction.  I suspect fevers, leukocytosis are from cholangitis.  Plan ERCP/stent placement in am  The risks, benefits, and possible complications of the procedure, including  But  not limited to bleeding, perforation, surgery, and the 5-10% risk for pancreatitis, were explained to the patient.  It was explained that  post ERCP pancreatitis which, while usually is mild, occasionally maybe severe and even life-threatening.  Patient's questions were answered.   Sandy Salaam. Deatra Ina, M.D., [facility] Cell [phone]

## 2014-06-08 NOTE — Anesthesia Preprocedure Evaluation (Addendum)
Anesthesia Evaluation  Patient identified by MRN, date of birth, ID band Patient awake    Reviewed: Allergy & Precautions, H&P , NPO status , Patient's Chart, lab work & pertinent test results, reviewed documented beta blocker date and time   Airway Mallampati: III  TM Distance: >3 FB Neck ROM: full    Dental no notable dental hx. (+) Teeth Intact, Dental Advisory Given   Pulmonary sleep apnea and Continuous Positive Airway Pressure Ventilation , former smoker,    Pulmonary exam normal       Cardiovascular Exercise Tolerance: Good hypertension, Pt. on medications and Pt. on home beta blockers + CAD + dysrhythmias Supra Ventricular Tachycardia  Ablation for SVT.  Last cardiology visit 8/12   Neuro/Psych negative neurological ROS  negative psych ROS   GI/Hepatic Neg liver ROS, GERD-  Medicated and Controlled,  Endo/Other  diabetes, Type 2, Oral Hypoglycemic AgentsMorbid obesity  Renal/GU Renal diseasenegative Renal ROS     Musculoskeletal   Abdominal (+) + obese,   Peds  Hematology   Anesthesia Other Findings   Reproductive/Obstetrics                            Anesthesia Physical  Anesthesia Plan  ASA: III and emergent  Anesthesia Plan: General   Post-op Pain Management:    Induction: Intravenous  Airway Management Planned: LMA and Oral ETT  Additional Equipment:   Intra-op Plan:   Post-operative Plan:   Informed Consent: I have reviewed the patients History and Physical, chart, labs and discussed the procedure including the risks, benefits and alternatives for the proposed anesthesia with the patient or authorized representative who has indicated his/her understanding and acceptance.   Dental Advisory Given  Plan Discussed with: CRNA and Surgeon  Anesthesia Plan Comments:        Anesthesia Quick Evaluation

## 2014-06-08 NOTE — Transfer of Care (Signed)
Immediate Anesthesia Transfer of Care Note  Patient: [name]  Procedure(s) Performed: Procedure(s): ENDOSCOPIC RETROGRADE CHOLANGIOPANCREATOGRAPHY (ERCP) (N/A)  Patient Location: PACU  Anesthesia Type:General  Level of Consciousness: awake, alert  and oriented  Airway & Oxygen Therapy: Patient Spontanous Breathing and Patient connected to face mask oxygen  Post-op Assessment: Report given to PACU RN and Post -op Vital signs reviewed and stable  Post vital signs: Reviewed and stable  Complications: No apparent anesthesia complications

## 2014-06-08 NOTE — Progress Notes (Signed)
Unable to do ERCP because of tumor invading the second portion the duodenum.  The ampulla could not be identified.  Plan PTC in a.m.  If a guidewire can be passed into the ampulla, past the obstruction, then we will do an rendezvous procedure whereby I will place an internal stent at the same sitting.

## 2014-06-09 ENCOUNTER — Inpatient Hospital Stay (HOSPITAL_COMMUNITY): Payer: Medicare Other

## 2014-06-09 ENCOUNTER — Encounter (HOSPITAL_COMMUNITY)
Admission: EM | Disposition: A | Discharge status: Skilled Nursing Facility | Payer: Self-pay | Source: Home / Self Care | Attending: Internal Medicine

## 2014-06-09 ENCOUNTER — Encounter (HOSPITAL_COMMUNITY): Payer: Self-pay | Admitting: Radiology

## 2014-06-09 DIAGNOSIS — K8309 Other cholangitis: Secondary | ICD-10-CM | POA: Insufficient documentation

## 2014-06-09 DIAGNOSIS — K831 Obstruction of bile duct: Secondary | ICD-10-CM | POA: Insufficient documentation

## 2014-06-09 DIAGNOSIS — C259 Malignant neoplasm of pancreas, unspecified: Secondary | ICD-10-CM

## 2014-06-09 DIAGNOSIS — K830 Cholangitis: Secondary | ICD-10-CM

## 2014-06-09 HISTORY — PX: BILIARY STENT PLACEMENT: SHX5538

## 2014-06-09 HISTORY — PX: ESOPHAGOGASTRODUODENOSCOPY (EGD) WITH PROPOFOL: SHX5813

## 2014-06-09 LAB — PROTIME-INR
INR: 1.28 (ref 0.00–1.49)
Prothrombin Time: 16.1 seconds — ABNORMAL HIGH (ref 11.6–15.2)

## 2014-06-09 LAB — GLUCOSE, CAPILLARY
GLUCOSE-CAPILLARY: 204 mg/dL — AB (ref 70–99)
Glucose-Capillary: 302 mg/dL — ABNORMAL HIGH (ref 70–99)
Glucose-Capillary: 232 mg/dL — ABNORMAL HIGH (ref 70–99)
Glucose-Capillary: 255 mg/dL — ABNORMAL HIGH (ref 70–99)
Glucose-Capillary: 301 mg/dL — ABNORMAL HIGH (ref 70–99)

## 2014-06-09 LAB — COMPREHENSIVE METABOLIC PANEL
ALT: 137 U/L — AB (ref 0–53)
AST: 157 U/L — ABNORMAL HIGH (ref 0–37)
Albumin: 1.5 g/dL — ABNORMAL LOW (ref 3.5–5.2)
Alkaline Phosphatase: 413 U/L — ABNORMAL HIGH (ref 39–117)
Anion gap: 9 (ref 5–15)
BUN: 36 mg/dL — ABNORMAL HIGH (ref 6–23)
CALCIUM: 7.8 mg/dL — AB (ref 8.4–10.5)
CO2: 24 mmol/L (ref 19–32)
Chloride: 97 mEq/L (ref 96–112)
Creatinine, Ser: 1.37 mg/dL — ABNORMAL HIGH (ref 0.50–1.35)
GFR, EST AFRICAN AMERICAN: 58 mL/min — AB (ref >90)
GFR, EST NON AFRICAN AMERICAN: 50 mL/min — AB (ref >90)
GLUCOSE: 347 mg/dL — AB (ref 70–99)
POTASSIUM: 4.3 mmol/L (ref 3.5–5.1)
Sodium: 130 mmol/L — ABNORMAL LOW (ref 135–145)
Total Bilirubin: 4.7 mg/dL — ABNORMAL HIGH (ref 0.3–1.2)
Total Protein: 5.6 g/dL — ABNORMAL LOW (ref 6.0–8.3)

## 2014-06-09 LAB — CBC
HEMATOCRIT: 25.2 % — AB (ref 39.0–52.0)
HEMOGLOBIN: 8 g/dL — AB (ref 13.0–17.0)
MCH: 29 pg (ref 26.0–34.0)
MCHC: 31.7 g/dL (ref 30.0–36.0)
MCV: 91.3 fL (ref 78.0–100.0)
Platelets: 294 10*3/uL (ref 150–400)
RBC: 2.76 MIL/uL — AB (ref 4.22–5.81)
RDW: 16.2 % — ABNORMAL HIGH (ref 11.5–15.5)
WBC: 18.4 10*3/uL — AB (ref 4.0–10.5)

## 2014-06-09 SURGERY — ESOPHAGOGASTRODUODENOSCOPY (EGD) WITH PROPOFOL
Anesthesia: Monitor Anesthesia Care

## 2014-06-09 MED ORDER — FUROSEMIDE 40 MG PO TABS
40.0000 mg | ORAL_TABLET | Freq: Every day | ORAL | Status: DC
  Filled 2014-06-09: qty 1

## 2014-06-09 MED ORDER — MIDAZOLAM HCL 10 MG/2ML IJ SOLN
INTRAMUSCULAR | Status: DC | PRN
  Administered 2014-06-09 (×2): 2 mg via INTRAVENOUS
  Administered 2014-06-09 (×4): 1 mg via INTRAVENOUS

## 2014-06-09 MED ORDER — BUTAMBEN-TETRACAINE-BENZOCAINE 2-2-14 % EX AERO
INHALATION_SPRAY | CUTANEOUS | Status: DC | PRN
  Administered 2014-06-09: 22 via TOPICAL

## 2014-06-09 MED ORDER — MIDAZOLAM HCL 2 MG/2ML IJ SOLN
INTRAMUSCULAR | Status: AC
  Filled 2014-06-09: qty 6

## 2014-06-09 MED ORDER — IOHEXOL 300 MG/ML  SOLN
50.0000 mL | Freq: Once | INTRAMUSCULAR | Status: AC | PRN
  Administered 2014-06-09: 20 mL

## 2014-06-09 MED ORDER — FENTANYL CITRATE 0.05 MG/ML IJ SOLN
INTRAMUSCULAR | Status: AC
  Filled 2014-06-09: qty 4

## 2014-06-09 MED ORDER — MIDAZOLAM HCL 2 MG/2ML IJ SOLN
INTRAMUSCULAR | Status: AC | PRN
  Administered 2014-06-09 (×4): 0.5 mg via INTRAVENOUS

## 2014-06-09 MED ORDER — FENTANYL CITRATE 0.05 MG/ML IJ SOLN
INTRAMUSCULAR | Status: DC | PRN
  Administered 2014-06-09 (×4): 25 ug via INTRAVENOUS

## 2014-06-09 MED ORDER — MIDAZOLAM HCL 10 MG/2ML IJ SOLN
INTRAMUSCULAR | Status: AC
  Filled 2014-06-09: qty 4

## 2014-06-09 MED ORDER — ALPRAZOLAM 0.5 MG PO TABS
0.5000 mg | ORAL_TABLET | Freq: Three times a day (TID) | ORAL | Status: DC | PRN
Start: 2014-06-09 — End: 2014-06-14

## 2014-06-09 MED ORDER — DIPHENHYDRAMINE HCL 50 MG/ML IJ SOLN
INTRAMUSCULAR | Status: AC
  Filled 2014-06-09: qty 1

## 2014-06-09 MED ORDER — GLUCAGON HCL RDNA (DIAGNOSTIC) 1 MG IJ SOLR
INTRAMUSCULAR | Status: AC
  Filled 2014-06-09: qty 1

## 2014-06-09 MED ORDER — AMOXICILLIN-POT CLAVULANATE 875-125 MG PO TABS
1.0000 | ORAL_TABLET | Freq: Two times a day (BID) | ORAL | Status: DC
  Administered 2014-06-10 – 2014-06-14 (×9): 1 via ORAL
  Filled 2014-06-09 (×10): qty 1

## 2014-06-09 MED ORDER — LIDOCAINE HCL 1 % IJ SOLN
INTRAMUSCULAR | Status: AC
  Filled 2014-06-09: qty 20

## 2014-06-09 MED ORDER — FENTANYL CITRATE 0.05 MG/ML IJ SOLN
INTRAMUSCULAR | Status: AC | PRN
  Administered 2014-06-09 (×4): 25 ug via INTRAVENOUS

## 2014-06-09 MED ORDER — GLUCAGON HCL RDNA (DIAGNOSTIC) 1 MG IJ SOLR
INTRAMUSCULAR | Status: DC | PRN
  Administered 2014-06-09: 0.2 mg via INTRAVENOUS

## 2014-06-09 SURGICAL SUPPLY — 15 items

## 2014-06-09 NOTE — H&P (Signed)
Reason for Consult: Cholangitis, CBD Stricture Chief Complaint: Chief Complaint  Patient presents with  . Weakness   Referring Physician(s): GI  History of Present Illness: [name] is a 73 y.o. male with pancreatic cancer presented with abdominal pain, fever, chills and met criteria for sepsis. Patient found to have cholangitis and obstructive jaundice with CBD stricture. GI has seen the patient s/p ERCP unsuccessful crossing narrowing secondary to tumor obscuring portion of duodenum. IR has been consulted for possible cholangiogram and wire placement across stricture/drain placement in biliary system. The patient denies any abdominal pain, chest pain or shortness of breath. The patient admits to history of sleep apnea and uses a CPAP. He has previously tolerated sedation without complications.    Past Medical History  Diagnosis Date  . Diabetes mellitus   . Hyperlipidemia   . Hypertension   . GERD (gastroesophageal reflux disease)   . Coronary artery disease     a. S/P prior PTCA in the 90's;  b. 04/2007 Cath: LM nl, LAD Ca2+ prox, LCX nl, RI nl, OM nl, RCA 40-50p, 40d, EF 60%;  b. 09/2012 Echo: EF 55-60%, mild LVH, nl wall motion, Gr 1 DD, mildly dil LA.  . Right ureteral stone   . SVT (supraventricular tachycardia)     a. s/p ablation per Dr. Lovena Le 10 -8yrs ago  . OSA on CPAP     a. cpap setting of 16  . Arthritis   . Bilateral kidney stones   . Shortness of breath dyspnea   . CKD (chronic kidney disease), stage III   . Pancreatic cancer 05/17/14    Pancreas  . Cancer dx Dec 2015    pancreatic  . Allergy     Past Surgical History  Procedure Laterality Date  . Right ureteroscopic stone extraction  09-26-2010  . Circumcision and fulgeration of condyloma  06-22-2003  . Cardiac electrophysiology study and ablation  2002  . Transthoracic echocardiogram  03-03-2011    MODERATE LVH/ LVSF NORMAL / EF 60-65%/ MILDLY DILATED LEFT ATRIUMK  . Severeal ureteroscopic stone  extractions    . Ureteroscopy  11/13/2011    Procedure: URETEROSCOPY;  Surgeon: Claybon Jabs, MD;  Location: [facility];  Service: Urology;  Laterality: Right;  RIGHT URETEROSCOPY WITH HOLMIUM LASER LIHTOTRIPSY DIGITAL URETEROSCOPE  . Coronary angioplasty  1994    [place] OF THE LAD  . Cardiac catheterization  04-27-2007    CAD WITH 30% NARROWING IN THE MID-LAD/ 50 % NARROWING PROXIMAL CIRCUMFLEX/ 40% NARROWING SECOND MARGINAL BRANCH/ 40-50% PROXIMAL RCA WITH DISTAL POSTERIOR NARROWING/ NORMAL LVF  . Cardiac catheterization  2000    NON-OBSTRUCTIVE CAD  . Laparoscopic cholecystectomy  03-26-2005  . Total knee arthroplasty  07/18/2012    Procedure: TOTAL KNEE ARTHROPLASTY;  Surgeon: Johnn Hai, MD;  Location: [facility];  Service: Orthopedics;  Laterality: Right;  . Esophagogastroduodenoscopy N/A 05/12/2014    Procedure: ESOPHAGOGASTRODUODENOSCOPY (EGD);  Surgeon: Beryle Beams, MD;  Location: [facility];  Service: Endoscopy;  Laterality: N/A;    Allergies: Clindamycin/lincomycin; Codeine; and Penicillins  Medications: Prior to Admission medications   Medication Sig Start Date End Date Taking? Authorizing Provider  aspirin EC 81 MG tablet Take 81 mg by mouth daily.   Yes Historical Provider, MD  atorvastatin (LIPITOR) 20 MG tablet Take 20 mg by mouth at bedtime. ON HOLD   Yes Historical Provider, MD  doxazosin (CARDURA) 2 MG tablet Take 2 mg by mouth daily before breakfast.    Yes  Historical Provider, MD  furosemide (LASIX) 20 MG tablet Take 40 mg by mouth every morning.  01/09/14  Yes Burnell Blanks, MD  insulin NPH-regular Human (NOVOLIN 70/30) (70-30) 100 UNIT/ML injection Inject 75-85 Units into the skin 2 (two) times daily with a meal. 75 units in am with breakfast and 85 units in pm with supper   Yes Historical Provider, MD  lisinopril (PRINIVIL,ZESTRIL) 20 MG tablet Take 10 mg by mouth daily.    Yes Historical Provider, MD  metFORMIN (GLUCOPHAGE-XR) 500 MG 24  hr tablet Take 500 mg by mouth daily with supper.  05/01/14  Yes Historical Provider, MD  metoprolol (LOPRESSOR) 100 MG tablet Take 50 mg by mouth 2 (two) times daily. Takes half in am and half tablet in pm   Yes Historical Provider, MD  Multiple Vitamins-Minerals (PRESERVISION AREDS PO) Take 1 tablet by mouth 2 (two) times daily.   Yes Historical Provider, MD  omeprazole (PRILOSEC) 20 MG capsule Take 20 mg by mouth 2 (two) times daily.    Yes Historical Provider, MD  ondansetron (ZOFRAN) 8 MG tablet Take 1 tablet (8 mg total) by mouth every 8 (eight) hours as needed for nausea or vomiting. 05/30/14  Yes Wyatt Portela, MD  potassium chloride SA (K-DUR,KLOR-CON) 20 MEQ tablet Take 1 tablet (20 mEq total) by mouth 2 (two) times daily. Patient taking differently: Take 20 mEq by mouth daily. Takes 20 meq daily 01/09/14  Yes Burnell Blanks, MD  clindamycin (CLEOCIN) 300 MG capsule Take 300 mg by mouth 3 (three) times daily.    Historical Provider, MD  fluorouracil (EFUDEX) 5 % cream Apply 1 application topically 2 (two) times daily.    Historical Provider, MD  lidocaine-prilocaine (EMLA) cream Apply 1 application topically as needed. Patient taking differently: Apply 1 application topically as needed (port access).  05/30/14   Wyatt Portela, MD  LORazepam (ATIVAN) 0.5 MG tablet Take 0.5 mg by mouth every 6 (six) hours as needed for anxiety (nausea).  05/23/14   Historical Provider, MD  Water For Injection Sterile (STERILE WATER, PRESERVATIVE FREE,) injection Give 10 mLs by tube 4 (four) times daily.  05/18/14   Historical Provider, MD    Family History  Problem Relation Age of Onset  . Heart attack Mother   . Heart attack Brother   . Colon cancer Neg Hx   . Stomach cancer Neg Hx     History   Social History  . Marital Status: Married    Spouse Name: N/A    Number of Children: 98  . Years of Education: N/A   Occupational History  . landscape     owner business   Social History Main  Topics  . Smoking status: Former Smoker -- 1.00 packs/day for 15 years    Types: Cigarettes    Quit date: 06/15/1986  . Smokeless tobacco: Never Used  . Alcohol Use: Yes     Comment: occasional  . Drug Use: No  . Sexual Activity: None   Other Topics Concern  . None   Social History Narrative    Review of Systems: A 12 point ROS discussed and pertinent positives are indicated in the HPI above.  All other systems are negative.  Review of Systems  Vital Signs: BP 109/61 mmHg  Pulse 73  Temp(Src) 97.4 F (36.3 C) (Oral)  Resp 22  Ht $R'6\' 6"'ka$  (1.981 m)  Wt 295 lb (133.811 kg)  BMI 34.10 kg/m2  SpO2 100%  Physical Exam  Constitutional: He is oriented to person, place, and time.  HENT:  Head: Normocephalic and atraumatic.  Neck: No tracheal deviation present.  Cardiovascular: Normal rate and regular rhythm.  Exam reveals no gallop and no friction rub.   No murmur heard. Pulmonary/Chest: Effort normal and breath sounds normal. No respiratory distress. He has no wheezes. He has no rales.  Abdominal: Soft. Bowel sounds are normal. There is no tenderness.  Neurological: He is alert and oriented to person, place, and time.  Skin: He is not diaphoretic.  Jaundice    Imaging: Dg Chest 2 View  05/17/2014   CLINICAL DATA:  Shortness of breath and fever. History of hypertension and diabetes.  EXAM: CHEST  2 VIEW  COMPARISON:  05/13/2014; 05/09/2014; chest CT - 05/10/2014  FINDINGS: Grossly unchanged enlarged cardiac silhouette and mediastinal contours with minimal atherosclerotic plaque within the thoracic aorta. The lungs remain hyperexpanded. There is blunting of the bilateral costophrenic angles suggestive of trace bilateral pleural effusions. Unchanged bibasilar heterogeneous opacities, left greater than right, likely atelectasis. No new focal airspace opacities. No evidence of edema. No pneumothorax. Unchanged bones.  IMPRESSION: 1. Cardiomegaly and suspected trace bilateral effusions  without evidence of edema. 2. Unchanged bibasilar opacities, left greater than right, favored to represent atelectasis. No discrete focal airspace opacities to suggest pneumonia.   Electronically Signed   By: Sandi Mariscal M.D.   On: 05/17/2014 09:41   Dg Cervical Spine Complete  05/31/2014   CLINICAL DATA:  Weakness for 2 weeks. Intermittent pain in neck and upper back pain.  EXAM: CERVICAL SPINE  4+ VIEWS  COMPARISON:  None.  FINDINGS: Straightening of normal cervical lordosis. There is multi level disc space narrowing and ventral endplate spurring compatible with degenerative disc disease. The prevertebral soft tissue space is normal. Bilateral neuroforaminal narrowing due to posterior spur formation and facet joint hypertrophy is noted.  IMPRESSION: 1. Cervical degenerative disc disease 2. No acute findings.   Electronically Signed   By: Kerby Moors M.D.   On: 05/31/2014 15:54   Dg Shoulder Right  05/31/2014   CLINICAL DATA:  Shoulder pain  EXAM: RIGHT SHOULDER - 2+ VIEW  COMPARISON:  None.  FINDINGS: Normal alignment no fracture. Mild degenerative change in the [facility] joint.  IMPRESSION: Mild AC degenerative change.  No acute abnormality.   Electronically Signed   By: Franchot Gallo M.D.   On: 05/31/2014 16:01   Dg Abd 1 View  06/08/2014   CLINICAL DATA:  Pancreatic carcinoma  EXAM: DG C-ARM 1-60 MIN - NRPT [facility]; ABDOMEN - 1 VIEW  COMPARISON:  05/31/2014  FINDINGS: Multiple spot films were obtained during and attempted ERCP. Access to the common bile duct was not able to be performed due to the known pancreatic mass.  FLUOROSCOPY TIME:  5 min 6 seconds  IMPRESSION: Inability to cannulate the CBD.   Electronically Signed   By: Inez Catalina M.D.   On: 06/08/2014 12:04   Ct Angio Chest Pe W/cm &/or Wo Cm  05/10/2014   CLINICAL DATA:  Leukocytosis, fever of unknown origin, short of breath, chest pain and dizziness.  EXAM: CT ANGIOGRAPHY CHEST  CT ABDOMEN AND PELVIS WITH CONTRAST  TECHNIQUE:  Multidetector CT imaging of the chest was performed using the standard protocol during bolus administration of intravenous contrast. Multiplanar CT image reconstructions and MIPs were obtained to evaluate the vascular anatomy. Multidetector CT imaging of the abdomen and pelvis was performed using the standard protocol during bolus administration of intravenous contrast.  CONTRAST:  57mL OMNIPAQUE IOHEXOL 350 MG/ML SOLN  COMPARISON:  80 mL Omnipaque 350  FINDINGS: CTA CHEST FINDINGS  No filling defects within the pulmonary arteries to suggest acute pulmonary embolism. No acute findings aorta great vessels. No pericardial fluid. Esophagus is normal.  Review of the lung parenchyma demonstrates no suspicious pulmonary nodules. No airspace disease. No pleural fluid.  CT ABDOMEN and PELVIS FINDINGS  Abdominal portion was scanned at a delayed time . Therefore it is essentially a noncontrast exam.  Hepatobiliary: No focal hepatic lesion identified on this noncontrast exam. Post cholecystectomy.  Pancreas: There is an mass at the level of the pancreatic head measuring 5.2 x 3.1 x 6.1 cm. This mass is along the medial border of the second portion the duodenum and compressive the lumen of the duodenum duodenum. There is enlarged lymph node adjacent to the mass measuring 19 mm. No pancreatic duct dilatation. There is no biliary duct dilatation.  Spleen: Normal spleen appear  Adrenals/Urinary Tract: Adrenal glands are normal. Delayed imaging of the kidneys demonstrate no obstruction. Ureters and bladder normal.  Stomach/Bowel: Stomach, small bowel and appendix are normal. The colon rectosigmoid colon are normal. There are diverticula of the sigmoid colon acute inflammation.  Mass in the C-loop of the duodenum described in the pancreatic section.  Vascular/Lymphatic: There is scattered calcification of the abdominal aorta. No retroperitoneal periportal lymphadenopathy. No mesenteric or peritoneal metastasis.  Reproductive:  Prostate gland and bladder normal. No pelvic lymphadenopathy.  Other: No free fluid in the abdomen pelvis.  No free air  Musculoskeletal: No aggressive osseous lesion.  Review of the MIP images confirms the above findings.  IMPRESSION: Chest Impression:  No evidence of acute pulmonary embolism.  Abdomen / Pelvis Impression:  1. Mass adjacent to pancreatic head and along the C-loop of the duodenum measuring 6 cm. No pancreatic duct dilatation or biliary duct dilatation. Differential includes pancreatic neoplasm, gastrointestinal stromal tumor, or lymphoma. Recommend endoscopic ultrasound with biopsy of this lesion.  2. Local lymphadenopathy with enlarged peripancreatic lymph node.  3. Liver parenchyma is poorly evaluated on this essentially noncontrast exam.  Findings conveyed toDAWOOD ELGERGAWY on 05/10/2014  at19:17.   Electronically Signed   By: Genevive Bi M.D.   On: 05/10/2014 19:18   Ct Abdomen Pelvis W Contrast  05/31/2014   CLINICAL DATA:  Generalized abdominal pain, worsening over past 2 weeks. Newly diagnosed pancreatic carcinoma.  EXAM: CT ABDOMEN AND PELVIS WITH CONTRAST  TECHNIQUE: Multidetector CT imaging of the abdomen and pelvis was performed using the standard protocol following bolus administration of intravenous contrast.  CONTRAST:  OMNIPAQUE IOHEXOL 300 MG/ML SOLN, 52mL OMNIPAQUE IOHEXOL 300 MG/ML SOLN  COMPARISON:  05/12/2014  FINDINGS: Lower Chest: Tiny bilateral pleural effusions and dependent atelectasis.  Hepatobiliary: No masses or other significant abnormality identified. Prior cholecystectomy noted.  Pancreas: 6.4 cm mass involving the pancreatic head and descending duodenum. This is not significant changed in size compared to previous study. Increased hazy opacity is seen surrounding the duodenum and pancreatic head with small amount of fluid seen in Morison's pouch. This may be due to duodenitis or pancreatitis.  Spleen:  Within normal limits in size and appearance.   Adrenal Glands:  No mass identified.  Kidneys/Urinary Tract: Tiny less than 1 cm bilateral renal calculi are again noted. No evidence of ureteral calculi or hydronephrosis. Bilateral perinephric soft tissue stranding and small amount of fluid are stable in appearance and nonspecific.  Stomach/Bowel/Peritoneum: No evidence of wall thickening, mass, or obstruction. Colonic  diverticulosis noted. No evidence of diverticulitis.  Vascular/Lymphatic: Peripancreatic lymphadenopathy noted, with largest lymph node measuring 1.8 cm on image 40, which is unchanged.  Reproductive:  No mass or other significant abnormality identified.  Other:  None.  Musculoskeletal:  No suspicious bone lesions identified.  IMPRESSION: Stable 6 cm mass involving the pancreatic head and duodenum, consistent with known pancreatic carcinoma. No evidence of obstruction.  Stable mild peripancreatic lymphadenopathy, consistent metastatic disease.  Increased hazy opacity surrounding the pancreatic head and duodenum and small amount of fluid within Morison's pouch. This may be related to pancreatitis or duodenitis.  Bilateral nonobstructive nephrolithiasis and diverticulosis incidentally noted.   Electronically Signed   By: Earle Gell M.D.   On: 05/31/2014 14:36   Ct Abdomen Pelvis W Contrast  05/10/2014   CLINICAL DATA:  Leukocytosis, fever of unknown origin, short of breath, chest pain and dizziness.  EXAM: CT ANGIOGRAPHY CHEST  CT ABDOMEN AND PELVIS WITH CONTRAST  TECHNIQUE: Multidetector CT imaging of the chest was performed using the standard protocol during bolus administration of intravenous contrast. Multiplanar CT image reconstructions and MIPs were obtained to evaluate the vascular anatomy. Multidetector CT imaging of the abdomen and pelvis was performed using the standard protocol during bolus administration of intravenous contrast.  CONTRAST:  68mL OMNIPAQUE IOHEXOL 350 MG/ML SOLN  COMPARISON:  80 mL Omnipaque 350  FINDINGS: CTA CHEST  FINDINGS  No filling defects within the pulmonary arteries to suggest acute pulmonary embolism. No acute findings aorta great vessels. No pericardial fluid. Esophagus is normal.  Review of the lung parenchyma demonstrates no suspicious pulmonary nodules. No airspace disease. No pleural fluid.  CT ABDOMEN and PELVIS FINDINGS  Abdominal portion was scanned at a delayed time . Therefore it is essentially a noncontrast exam.  Hepatobiliary: No focal hepatic lesion identified on this noncontrast exam. Post cholecystectomy.  Pancreas: There is an mass at the level of the pancreatic head measuring 5.2 x 3.1 x 6.1 cm. This mass is along the medial border of the second portion the duodenum and compressive the lumen of the duodenum duodenum. There is enlarged lymph node adjacent to the mass measuring 19 mm. No pancreatic duct dilatation. There is no biliary duct dilatation.  Spleen: Normal spleen appear  Adrenals/Urinary Tract: Adrenal glands are normal. Delayed imaging of the kidneys demonstrate no obstruction. Ureters and bladder normal.  Stomach/Bowel: Stomach, small bowel and appendix are normal. The colon rectosigmoid colon are normal. There are diverticula of the sigmoid colon acute inflammation.  Mass in the C-loop of the duodenum described in the pancreatic section.  Vascular/Lymphatic: There is scattered calcification of the abdominal aorta. No retroperitoneal periportal lymphadenopathy. No mesenteric or peritoneal metastasis.  Reproductive: Prostate gland and bladder normal. No pelvic lymphadenopathy.  Other: No free fluid in the abdomen pelvis.  No free air  Musculoskeletal: No aggressive osseous lesion.  Review of the MIP images confirms the above findings.  IMPRESSION: Chest Impression:  No evidence of acute pulmonary embolism.  Abdomen / Pelvis Impression:  1. Mass adjacent to pancreatic head and along the C-loop of the duodenum measuring 6 cm. No pancreatic duct dilatation or biliary duct dilatation.  Differential includes pancreatic neoplasm, gastrointestinal stromal tumor, or lymphoma. Recommend endoscopic ultrasound with biopsy of this lesion.  2. Local lymphadenopathy with enlarged peripancreatic lymph node.  3. Liver parenchyma is poorly evaluated on this essentially noncontrast exam.  Findings conveyed toDAWOOD ELGERGAWY on 05/10/2014  at19:17.   Electronically Signed   By: Helane Gunther.D.  On: 05/10/2014 19:18   Nm Pet Image Initial (pi) Skull Base To Thigh  05/29/2014   CLINICAL DATA:  Initial treatment strategy for poorly differentiated carcinoma of unclear primary.  EXAM: NUCLEAR MEDICINE PET SKULL BASE TO THIGH  TECHNIQUE: 15.9 mCi F-18 FDG was injected intravenously. Full-ring PET imaging was performed from the skull base to thigh after the radiotracer. CT data was obtained and used for attenuation correction and anatomic localization.  FASTING BLOOD GLUCOSE:  Value: 181 mg/dl  COMPARISON:  CT 05/10/2014  FINDINGS: NECK  No hypermetabolic lymph nodes in the neck.  CHEST  No hypermetabolic mediastinal or hilar nodes. No suspicious pulmonary nodules on the CT scan.  ABDOMEN/PELVIS  There is again demonstrated a mass along the second portion duodenum adjacent to pancreatic head measuring approximately 6.7 x 5.7 cm not changed in volume compared to prior. This mass is intensely hypermetabolic with SUV equal 09.6. Posterior to the mass there is a small hypermetabolic lymph node measuring 18 mm on image 143.  No evidence of hepatic metastasis. No additional metabolic lesions in the abdomen or pelvis. There is stranding around the left and right kidney.  SKELETON  No focal hypermetabolic activity to suggest skeletal metastasis.  IMPRESSION: 1. Mass along the C-loop of the duodenum adjacent to pancreatic head is intensely hypermetabolic consistent with carcinoma. 2. Adjacent small peripancreatic hypermetabolic lymph node. 3. No additional hypermetabolic adenopathy or metastatic disease.    Electronically Signed   By: Suzy Bouchard M.D.   On: 05/29/2014 16:17   US Abdomen Limited  06/07/2014   CLINICAL DATA:  Elevated LFTs. History of cholecystectomy. Pancreatic cancer.  EXAM: US ABDOMEN LIMITED - RIGHT UPPER QUADRANT  COMPARISON:  CT abdomen 05/31/2014  FINDINGS: Gallbladder:  Surgically absent.  Common bile duct:  Diameter: Dilated measuring 1.7 cm at the porta hepatis. There is mild central intrahepatic biliary dilatation.  Liver:  No focal lesion identified. Liver parenchyma is heterogeneous and difficult to penetrate.  Other: The small amount of ascites on prior CT is not seen. The known pancreatic head mass is identified.  IMPRESSION: 1. Dilatation of the proximal common bowel duct, with mild central intrahepatic biliary ductal dilatation. This appears grossly stable compared to prior CT. Postcholecystectomy. 2. Heterogeneous hepatic parenchyma without focal lesion. 3. The small amount of ascites on prior CT is not seen.   Electronically Signed   By: Jeb Levering M.D.   On: 06/07/2014 12:12   Ct Biopsy  05/17/2014   INDICATION: History of duodenal mass. Patient underwent nondiagnostic endoscopic biopsy and presents now for CT-guided biopsy of a peripancreatic/duodenal lymph node for tissue diagnostic purposes  EXAM: CT GUIDED BIOPSY OF PERIPANCREATIC/DUODENAL LYMPH NODE  COMPARISON:  CT the abdomen pelvis - 05/10/2014  MEDICATIONS: Fentanyl 50 mcg IV; Versed 1.5 mg IV  ANESTHESIA/SEDATION: Sedation time  20 minutes  CONTRAST:  None  COMPLICATIONS: None immediate  PROCEDURE: Informed consent was obtained from the patient following an explanation of the procedure, risks, benefits and alternatives. A time out was performed prior to the initiation of the procedure.  The patient was positioned right lateral decubitus on the CT table and a limited CT was performed for procedural planning demonstrating unchanged size and appearance of previously identified approximately 2.2 x 1.9 cm  enlarged peripancreatic/duodenal lymph node immediately adjacent to the large intraluminal duodenal mass (image 40, series 2). The procedure was planned. The operative site was prepped and draped in the usual sterile fashion. Appropriate trajectory was confirmed with a 22 gauge spinal  needle after the adjacent tissues were anesthetized with 1% Lidocaine with epinephrine.  Under intermittent CT guidance, a 17 gauge coaxial needle was advanced adjacent to the peripheral aspect of the lymph node (note, given patient body habitus, the coaxial needle was still approximately 2.3 cm too short for purchase directly within in the enlarged lymph node (representative image 2, series 6).  4 core needle samples were obtained with an 18 gauge core needle biopsy device while slightly advancing the biopsy device system. The co-axial needle was removed and hemostasis was achieved with manual compression.  A limited postprocedural CT was negative for hemorrhage or additional complication. A dressing was placed. The patient tolerated the procedure well without immediate postprocedural complication.  IMPRESSION: Technically successful CT guided core needle biopsy of dominant peripancreatic/duodenal lymph node.   Electronically Signed   By: Sandi Mariscal M.D.   On: 05/17/2014 10:55   Dg Chest Port 1 View  06/02/2014   CLINICAL DATA:  Dyspnea  EXAM: PORTABLE CHEST - 1 VIEW  COMPARISON:  May 31, 2014  FINDINGS: Central catheter tip is in the superior vena cava. No pneumothorax. There is atelectatic change in the left base. Lungs elsewhere clear. Heart is borderline enlarged in size with pulmonary vascularity within normal limits. No adenopathy.  IMPRESSION: Central catheter tip in superior vena cava. No pneumothorax. Left base atelectasis. Elsewhere lungs clear. No change in cardiac silhouette.   Electronically Signed   By: Lowella Grip M.D.   On: 06/02/2014 11:04   Dg Chest Port 1 View  05/13/2014   CLINICAL DATA:  Fever,  diabetes, hypertension  EXAM: PORTABLE CHEST - 1 VIEW  COMPARISON:  05/09/2014  FINDINGS: Stable cardiomegaly.  Atheromatous aorta.  Lungs are clear. Small left pleural effusion as before. Left arm PICC line to the cavoatrial junction. Regional bones unremarkable.  IMPRESSION: 1. Stable cardiomegaly and small left pleural effusion. 2. Left PICC line to cavoatrial junction.   Electronically Signed   By: Arne Cleveland M.D.   On: 05/13/2014 15:08   Dg Shoulder Left  05/31/2014   CLINICAL DATA:  Pain and weakness  EXAM: LEFT SHOULDER - 2+ VIEW  COMPARISON:  None.  FINDINGS: Frontal, Y scapular, and axillary images were obtained. There is no demonstrable fracture or dislocation. There is osteoarthritic change in the glenohumeral and acromioclavicular joints. No erosive change or intra-articular calcification.  IMPRESSION: Osteoarthritic change.  No fracture or dislocation.   Electronically Signed   By: Lowella Grip M.D.   On: 05/31/2014 15:53   Dg Abd Acute W/chest  05/31/2014   CLINICAL DATA:  Abdominal pain with nausea.  Pancreatic carcinoma.  EXAM: ACUTE ABDOMEN SERIES (ABDOMEN 2 VIEW & CHEST 1 VIEW)  COMPARISON:  PET-CT May 29, 2014 ; chest radiograph May 17, 2014  FINDINGS: PA chest: There is atelectatic change in the left base. Elsewhere lungs are clear. Heart is borderline enlarged with pulmonary vascularity within normal limits. No adenopathy.  Supine and left lateral decubitus abdomen: There is moderate stool throughout the colon. There is no bowel dilatation or air-fluid level suggesting obstruction. No free air. Surgical clips are present in the right upper quadrant. There is a phlebolith in left pelvis.  IMPRESSION: Unremarkable bowel gas pattern. No obstruction or free air. Left base atelectasis. No lung edema or consolidation.   Electronically Signed   By: Lowella Grip M.D.   On: 05/31/2014 12:22   Dg C-arm 1-60 Min-no Report  06/08/2014   CLINICAL DATA:  Pancreatic  carcinoma  EXAM:  DG C-ARM 1-60 MIN - NRPT [facility]; ABDOMEN - 1 VIEW  COMPARISON:  05/31/2014  FINDINGS: Multiple spot films were obtained during and attempted ERCP. Access to the common bile duct was not able to be performed due to the known pancreatic mass.  FLUOROSCOPY TIME:  5 min 6 seconds  IMPRESSION: Inability to cannulate the CBD.   Electronically Signed   By: Inez Catalina M.D.   On: 06/08/2014 12:04    Labs:  CBC:  Recent Labs  06/05/14 0440 06/06/14 0535 06/07/14 0445 06/09/14 0422  WBC 17.9* 24.2* 23.4* 18.4*  HGB 8.1* 8.1* 8.1* 8.0*  HCT 25.2* 25.7* 25.1* 25.2*  PLT 266 313 308 294    COAGS:  Recent Labs  05/14/14 0409 05/31/14 1116 06/09/14 0422  INR 1.24 1.24 1.28  APTT 26  --   --     BMP:  Recent Labs  06/05/14 0440 06/06/14 0535 06/07/14 0445 06/09/14 0422  NA 130* 131* 132* 130*  K 4.6 4.3 4.5 4.3  CL 98 100 98 97  CO2 $Re'24 24 26 24  'nCD$ GLUCOSE 222* 146* 216* 347*  BUN 24* 25* 25* 36*  CALCIUM 7.8* 8.0* 7.8* 7.8*  CREATININE 1.07 1.09 1.11 1.37*  GFRNONAA 67* 66* 64* 50*  GFRAA 78* 76* 75* 58*    LIVER FUNCTION TESTS:  Recent Labs  05/17/14 0600 05/31/14 1116 06/07/14 0445 06/09/14 0422  BILITOT 0.6 0.6 1.7* 4.7*  AST 30 21 182* 157*  ALT 33 18 116* 137*  ALKPHOS 66 65 302* 413*  PROT 5.1* 5.8* 5.9* 5.6*  ALBUMIN 1.6* 1.5* 1.5* 1.5*    TUMOR MARKERS:  Recent Labs  05/11/14 1757  CA199 11.6*    Assessment and Plan: Sepsis, cholangitis Pancreatic cancer Biliary obstruction T. Bili 4.7 (1.7) S/p ERCP and unable to cross narrowing Request for cholangiogram and possible wire placement across stricture for GI to utilize in endo for stent placement, if unable to cross may leave percutaneous biliary drain in place Patient has been NPO, no blood thinners given, on antibiotics Risks and Benefits discussed with the patient and his family. All questions were answered, patient and his family are agreeable to proceed. Consent signed and in  chart. OSA uses CPAP  HTN DM Recent C. Difficile, treated.  Diastolic CHF History of SVT s/p ablation  CAD CKD   Thank you for this interesting consult.  I greatly enjoyed meeting [name] and look forward to participating in their care.    I spent a total of 20 minutes face to face in clinical consultation, greater than 50% of which was counseling/coordinating care   Signed: Hedy Jacob 06/09/2014, 9:01 AM

## 2014-06-09 NOTE — Op Note (Signed)
[place], [id number]   ENDOSCOPY PROCEDURE REPORT  PATIENT: [name], Rice  MR#: [id number] BIRTHDATE: 1941-02-16 , 73  yrs. old GENDER: male ENDOSCOPIST: Inda Castle, MD REFERRED BY:  Zola Button, M.D. PROCEDURE DATE:  06/09/2014 PROCEDURE:  EGD w/ transendoscopic intraluminal tube or catheter placement ASA CLASS:     Class III INDICATIONS:  bile duct obstruction.  Status post PTC with internal wire.  Procedure was performed to place an internal biliary stent and remove the PTC catheter. MEDICATIONS: Versed 8 mg IV and Fentanyl 100 mcg IV TOPICAL ANESTHETIC: Cetacaine Spray  DESCRIPTION OF PROCEDURE: After the risks benefits and alternatives of the procedure were thoroughly explained, informed consent was obtained.  The    endoscope was introduced through the mouth and advanced to the second portion of the duodenum , Without limitations.  The instrument was slowly withdrawn as the mucosa was fully examined.    Again noted was a exophytic, friable tumor along the medial wall of the second portion of the duodenum causing partial obstruction. The duodenoscope was passed, with resistance, into the third portion the duodenum where a previously placed wire and catheter from the PTC was identified.  The wire was grabbed by a polypectomy snare and pulled through the scope.  Under fluoroscopic guidance a #10 French 6 cm uncovered biliary Wallstent was placed through the previously identified distal bile duct stricture.  Inspissated bile was noted to promptly empty through the newly placed stent. a percutaneous transhepatic catheter was removed along with the wire. Retroflexed views revealed no abnormalities.     The scope was then withdrawn from the patient and the procedure completed.  COMPLICATIONS: There were no immediate complications.  ENDOSCOPIC IMPRESSION: 1.  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  Status  post placement of a uncovered metallic biliary stent   RECOMMENDATIONS: continue antibiotics  REPEAT EXAM:  eSigned:  Inda Castle, MD 06/09/2014 1:50 PM    CC:

## 2014-06-09 NOTE — Progress Notes (Signed)
06-09-14   1635   IV Team Note;  Received Nursing Consult that the patient's picc line had "fallen out" after Endo procedure;  Pt seen in his room by IV Team at approx 1600;  No bleeding noted from site, but a gauze pressure dressing was put on his right upper arm at that time;  Right hand and arm edematous; family feels "it's about 1/3 bigger than earlier today;"  RN notified; suggest doppler;  Spoke with RN from Endo; RN didn't know exact length of picc, estimated it "was about 10";"  "Didn't think to measure it;"  "the tip was intact;"   This IV nurse was unable to locate the picc line in the 3 trash cans in Endo;  Nurse from Endo started a 20ga peripheral iv in the patient's thumb;    Raynelle Fanning RN  IV Team

## 2014-06-09 NOTE — Progress Notes (Signed)
An internal uncovered metal biliary stent was placed successfully following PTC.  Inspissated bile with a small amount of pus was seen initially when the stent was deployed, indicating infection.  As previously described, there is locally invading pancreatic cancer.  Recommendations #1 advance diet as tolerated #2 continue antibiotics

## 2014-06-09 NOTE — Interval H&P Note (Signed)
History and Physical Interval Note:  06/09/2014 12:30 PM  [name]  has presented today for surgery, with the diagnosis of Bile duct obstruction  The various methods of treatment have been discussed with the patient and family. After consideration of risks, benefits and other options for treatment, the patient has consented to  Procedure(s): ESOPHAGOGASTRODUODENOSCOPY (EGD) WITH PROPOFOL (N/A) as a surgical intervention .  The patient's history has been reviewed, patient examined, no change in status, stable for surgery.  I have reviewed the patient's chart and labs.  Questions were answered to the patient's satisfaction.    The recent H&P (dated *06/09/14**) was reviewed, the patient was examined and there is no change in the patients condition since that H&P was completed.   Erskine Emery  06/09/2014, 12:30 PM    Erskine Emery

## 2014-06-09 NOTE — Progress Notes (Signed)
Pt now calmer and asleep. Daughter at bedside.

## 2014-06-09 NOTE — Progress Notes (Addendum)
Patient ID: [name], male   DOB: 11/08/1940, 73 y.o.   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 Alen Blew), recently hospitalized for C. Diff and still on oral Vancomycin, now [facility] to [facility] ED with main concern of several days duration of progressively worsening abdominal pain, intermittent in nature and throbbing, worse on the left side and occasionally but not consistently radiating to the right side, 7/10 in severity when present, with no specific alleviating or aggravating factors. This has been associated with nausea and poor oral intake, several lbs weight loss, fatigue, dyspnea on exertion. He denies any diarrhea or constipation, no blood in stool, no specific urinary concerns, no blood in urine, no fevers, chills,no chest pain.   In ED, pt noted to be in mild distress due to pain. VS notable for BP 85/43 mmHg, HR 113 bpm, RR 22 bpm, T 99.33F. Blood work notable for WBC > 30K, Hg 7.9 (repeat Hg 8.5, prior to transfusion). CT abd with ? Duodenitis. [facility] asked to admit for further evaluation and management of sepsis of unclear etiology. SDU requested. 2 U PRBC requested.   Assessment and Plan: Active Problems: Sepsis - criteria met on admission with tachycardia, leukocytosis, fever, RR 22, hypotension  - now since 12/25 with elevated LFT's ? Cholangitis possible source of persistent fever and leukocytosis  - continued broad spectrum ABX Vanc and Aztreonam for 5 days, transitioned to Rocephin and Flagyl 12/24 - change ABX to Augmentin 12/26 per ID ? Ascending cholangitis with CBD stricture - per GI, unable to do ERCP due to tumor invading second portion of the duodenum - PCT today  - CMET in AM Diastolic CHF, acute on chronic  - with  significant lower and upper extremity pitting edema - in the setting of hypoalbuminemia, not cardiac issue  - weight on admission 306 lbs --> up to 311 lbs --> 303 lbs --> 295 lbs this AM - placed on Lasix 40 mg IV QD 12/18 and increased frequency to BID on 12/20 to improve diuresis  - change lasix to PO 40 mg QD 12/26 - monitor weight, I's and O's Acute on chronic blood loss anemia, of pancreatic cancer, DM - review of records indicate that Hg is ~9 at baseline - pt denies any blood in stool or urine, FOBT is negative  - cancer is possible source of bleed - repeat Hg 12/17 was as low as 6.3, pt has received 2 U PRBC 12/17 and with appropriate increase in Hg 8.6 - Hg now remains stable with no signs of active bleeding  - IR consulted for port-a-cath placement but this will be on hold until infection resolves Leukocytosis - ? Cholangitis, ? Biliary duct obstruction  - oral Vanc for C. Diff discontinued (pt started on Dec 4th, 2015), completed Dec 19th, 2015 - changed ABX to Augment per ID 12/26 Acute renal failure - likely pre renal - repeat BMP in AM DM, type II, with complications of anemia and CKD II - A1C 8.8  - continue low dose Lantus for now until oral intake improves - hold Metformin and place on SSI for now  HTN - holding home medical regimen Metoprolol and Lisinopril - continuing Lasix in an attempt to improve diuresis  RUE edema > LUE, LE  edema bilateral  - doppler scan negative for DVT  - edema improving  - continue Lasix as noted above  HLD - on statin  CKD stage II - Cr is now stable and WNL Pancreatic cancer - appreciate Dr. 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  Family Communication: plan of care discussed with the patient Disposition Plan: Remains inpatient   IV access:   Peripheral IV Procedures and diagnostic studies:    Ct Abdomen Pelvis W Contrast 05/31/2014 Stable 6 cm mass involving the pancreatic head and duodenum, consistent with known pancreatic carcinoma. No evidence of obstruction. Stable mild peripancreatic lymphadenopathy, consistent metastatic disease. Increased hazy opacity surrounding the pancreatic head and duodenum and small amount of fluid within Morison's pouch. This may be related to pancreatitis or duodenitis. Bilateral nonobstructive nephrolithiasis and diverticulosis incidentally noted.   Nm Pet Image Initial (pi) Skull Base To Thigh 05/29/2014 1. Mass along the C-loop of the duodenum adjacent to pancreatic head is intensely hypermetabolic consistent with carcinoma. 2. Adjacent small peripancreatic hypermetabolic lymph node. 3. No additional hypermetabolic adenopathy or metastatic disease.   Dg Abd Acute W/ches 05/31/2014 Unremarkable bowel gas pattern. No obstruction or free air. Left base atelectasis. No lung edema or consolidation.   US Abdomen Limited 06/07/2014 Dilatation of the proximal common bowel duct, with mild central intrahepatic biliary ductal dilatation. This appears grossly stable compared to prior CT. Postcholecystectomy. 2. Heterogeneous hepatic parenchyma without focal lesion. 3. The small amount of ascites on prior CT is not seen. Medical Consultants:   Oncology Dr. Alen Blew   IR for port-a-cath placement  Cardiology for management of anasarca   GI  ID Other Consultants:   None  IAnti-Infectives:    Vancomycin 12/17 --> 12/21  Aztreonam 12/17 --> 12/21  Cipro 12/21 --> 12/24  Rocephin 12/24 --> 12/26  Flagyl 12/24 --> 12/26  Augmentin 12/26 -->  MAGICK-Johnnay Pleitez, Rebecca Eaton,  MD  [facility] Pager [phone]  If 7PM-7AM, please contact night-coverage [email or web address] Password [facility] 06/09/2014, 8:54 AM   LOS: 9 days   HPI/Subjective: No events overnight.   Objective: Filed Vitals:   06/08/14 1312 06/08/14 1319 06/08/14 2123 06/09/14 0528  BP: 126/54 124/54 97/51 109/61  Pulse: 109 109 73 73  Temp: 98.6 F (37 C) 98.6 F (37 C) 97.1 F (36.2 C) 97.4 F (36.3 C)  TempSrc:  Oral Oral Oral  Resp: _0 Height:      Weight:      SpO2: 95% 95% 94% 100%    Intake/Output Summary (Last 24 hours) at 06/09/14 0854 Last data filed at 06/09/14 0700  Gross per 24 hour  Intake    960 ml  Output      0 ml  Net    960 ml    Exam:   General:  Pt is alert, follows commands appropriately, not in acute distress  Cardiovascular: Regular rate and rhythm, no rubs, no gallops  Respiratory: Clear to auscultation bilaterally, no wheezing, no crackles, no rhonchi  Abdomen: Soft, non tender, non distended, bowel sounds present, no guarding  Data Reviewed: Basic Metabolic Panel:  Recent Labs Lab 06/03/14 0157 06/04/14 0417 06/05/14  0440 06/06/14 0535 06/07/14 0445 06/09/14 0422  NA  --  131* 130* 131* 132* 130*  K  --  4.7 4.6 4.3 4.5 4.3  CL  --  96 98 100 98 97  CO2  --  _0 GLUCOSE  --  187* 222* 146* 216* 347*  BUN  --  22 24* 25* 25* 36*  CREATININE  --  1.07 1.07 1.09 1.11 1.37*  CALCIUM  --  8.5 7.8* 8.0* 7.8* 7.8*  MG 1.6  --   --   --   --   --    Liver Function Tests:  Recent Labs Lab 06/07/14 0445 06/09/14 0422  AST 182* 157*  ALT 116* 137*  ALKPHOS 302* 413*  BILITOT 1.7* 4.7*  PROT 5.9* 5.6*  ALBUMIN 1.5* 1.5*   CBC:  Recent Labs Lab 06/04/14 0417 06/05/14 0440 06/06/14 0535 06/07/14 0445 06/09/14 0422  WBC 25.0* 17.9* 24.2* 23.4* 18.4*  NEUTROABS 20.3*  --   --   --   --   HGB 8.3* 8.1* 8.1* 8.1* 8.0*  HCT 26.1* 25.2* 25.7* 25.1* 25.2*  MCV 92.9 91.6 91.8 90.3 91.3  PLT 286 266 313 308 294    CBG:  Recent Labs Lab 06/08/14 1215 06/08/14 1312 06/08/14 1643 06/08/14 2121 06/09/14 0726  GLUCAP 212* 228* 283* 308* 302*    Recent Results (from the past 240 hour(s))  MRSA PCR Screening     Status: None   Collection Time: 05/31/14  4:25 PM  Result Value Ref Range Status   MRSA by PCR NEGATIVE NEGATIVE Final  Urine culture     Status: None   Collection Time: 05/31/14  9:15 PM  Result Value Ref Range Status   Specimen Description URINE, RANDOM  Final   Special Requests NONE  Final   Culture  Setup Time   Final    06/01/2014 01:24 Performed at [place]   Final    4,000 COLONIES/ML Performed at [place]   Final    INSIGNIFICANT GROWTH Performed at [facility]    Report Status 06/01/2014 FINAL  Final  Culture, blood (routine x 2)     Status: None   Collection Time: 05/31/14 10:59 PM  Result Value Ref Range Status   Specimen Description BLOOD PICC  Final   Special Requests BOTTLES DRAWN AEROBIC ONLY 3ML  Final   Culture  Setup Time   Final    06/01/2014 04:41 Performed at [place]   Final    NO GROWTH 5 DAYS Performed at [facility]    Report Status 06/07/2014 FINAL  Final  Clostridium Difficile by PCR     Status: None   Collection Time: 06/01/14  6:20 AM  Result Value Ref Range Status   C difficile by pcr NEGATIVE NEGATIVE Final    Comment: Performed at [facility]     Scheduled Meds: . sodium chloride   Intravenous Q72H  . cefTRIAXone (ROCEPHIN)  IV  2 g Intravenous Q24H  . citalopram  20 mg Oral Daily  . doxazosin  2 mg Oral QAC breakfast  . feeding supplement (GLUCERNA SHAKE)  237 mL Oral BID BM  . furosemide  20 mg Intravenous Daily  . insulin aspart  0-15 Units Subcutaneous TID WC  . insulin aspart protamine- aspart  55 Units Subcutaneous BID WC  . metronidazole  500 mg  Intravenous Q8H  . nystatin  5 mL Oral QID  . pantoprazole  40 mg Oral  Daily  . sodium chloride  10-40 mL Intracatheter Q12H   Continuous Infusions:

## 2014-06-09 NOTE — Progress Notes (Signed)
Pt tol. Procedure well. During transfer of pt from fluro table to stretcher, PICC line was inadvertently dislodged and removed. Clean linins were placed under pt, and PICC was discovered when transfer was completed. Lanny Cramp was informed upon returning pt to his room.  PICC appeared intact. IV pool/pumps were returned to pt room.  Wife Kenney Houseman, and family were informed, and apology made.  Wife stated she "understood that this sometime happens". Apology was made again.  22g,PIV was placed in R hand for access until PICC was replaced. Good blood return present, easily flushed.

## 2014-06-09 NOTE — Procedures (Signed)
Successful PTC WITH INSERTION OF CATHETER AND WIRE ACCESS INTO THE BILE DUCT SYSTEM TERMINATING IN THE DUODENUM This will be used for ERCP ACCESS/guidance FOR STENT INSERTION  No comp Stable Full report in PACS

## 2014-06-09 NOTE — Progress Notes (Signed)
OT Cancellation Note  Patient Details Name: SULEYMAN EHRMAN MRN: [id number] DOB: Feb 12, 1941   Cancelled Treatment:    Reason Eval/Treat Not Completed: Other (comment)  Checked with RN.  Pt is supposed to have procedure today and is worked up right now.  Will check back another day.  Kayleah Appleyard 06/09/2014, 9:04 AM  Lesle Chris, OTR/L [phone] 06/09/2014

## 2014-06-09 NOTE — Progress Notes (Signed)
Writer arrived and noted pt to be shaking vigorously all over and stating that he was "cold". Many blankets already on pt -- temperature raised in room. CPap on. Xanax given PO as ordered PRN.

## 2014-06-09 NOTE — Progress Notes (Signed)
[facility]    Subjective: No new complaints   Antibiotics:  Anti-infectives    Start     Dose/Rate Route Frequency Ordered Stop   06/07/14 1200  metroNIDAZOLE (FLAGYL) IVPB 500 mg     500 mg100 mL/hr over 60 Minutes Intravenous Every 8 hours 06/07/14 1048     06/07/14 1100  cefTRIAXone (ROCEPHIN) 2 g in dextrose 5 % 50 mL IVPB - Premix     2 g100 mL/hr over 30 Minutes Intravenous Every 24 hours 06/07/14 1048     06/05/14 1200  fluconazole (DIFLUCAN) tablet 100 mg  Status:  Discontinued     100 mg Oral Daily 06/05/14 0933 06/07/14 1046   06/04/14 2000  ciprofloxacin (CIPRO) tablet 500 mg  Status:  Discontinued     500 mg Oral 2 times daily 06/04/14 1649 06/07/14 0914   06/02/14 1015  fluconazole (DIFLUCAN) IVPB 100 mg  Status:  Discontinued     100 mg50 mL/hr over 60 Minutes Intravenous Every 24 hours 06/02/14 1011 06/02/14 1024   06/01/14 2000  vancomycin (VANCOCIN) 1,750 mg in sodium chloride 0.9 % 500 mL IVPB  Status:  Discontinued     1,750 mg250 mL/hr over 120 Minutes Intravenous Every 24 hours 05/31/14 1634 06/01/14 1249   06/01/14 2000  vancomycin (VANCOCIN) IVPB 1000 mg/200 mL premix  Status:  Discontinued     1,000 mg200 mL/hr over 60 Minutes Intravenous Every 12 hours 06/01/14 1249 06/04/14 1649   06/01/14 1600  fluconazole (DIFLUCAN) IVPB 100 mg  Status:  Discontinued     100 mg50 mL/hr over 60 Minutes Intravenous Every 24 hours 06/01/14 1426 06/05/14 0933   05/31/14 2000  vancomycin (VANCOCIN) 2,500 mg in sodium chloride 0.9 % 500 mL IVPB     2,500 mg250 mL/hr over 120 Minutes Intravenous NOW 05/31/14 1730 05/31/14 2345   05/31/14 1800  vancomycin (VANCOCIN) 50 mg/mL oral solution 125 mg  Status:  Discontinued     125 mg Oral 4 times daily 05/31/14 1610 06/02/14 1148   05/31/14 1700  aztreonam (AZACTAM) 1 g in dextrose 5 % 50 mL IVPB  Status:  Discontinued     1 g100 mL/hr over 30 Minutes Intravenous 3 times per day 05/31/14 1654 06/04/14  1649   05/31/14 1645  vancomycin (VANCOCIN) 2,500 mg in sodium chloride 0.9 % 500 mL IVPB  Status:  Discontinued     2,500 mg250 mL/hr over 120 Minutes Intravenous NOW 05/31/14 1633 05/31/14 1731      Medications: Scheduled Meds: . sodium chloride   Intravenous Q72H  . cefTRIAXone (ROCEPHIN)  IV  2 g Intravenous Q24H  . citalopram  20 mg Oral Daily  . doxazosin  2 mg Oral QAC breakfast  . feeding supplement (GLUCERNA SHAKE)  237 mL Oral BID BM  . fentaNYL      . insulin aspart  0-15 Units Subcutaneous TID WC  . insulin aspart protamine- aspart  55 Units Subcutaneous BID WC  . lidocaine      . metronidazole  500 mg Intravenous Q8H  . midazolam      . nystatin  5 mL Oral QID  . pantoprazole  40 mg Oral Daily  . sodium chloride  10-40 mL Intracatheter Q12H   Continuous Infusions:  PRN Meds:.acetaminophen, ALPRAZolam, fentaNYL, magic mouthwash w/lidocaine, magic mouthwash, morphine injection, ondansetron **OR** ondansetron (ZOFRAN) IV, sodium chloride, sodium chloride, zolpidem    Objective: Weight change:   Intake/Output Summary (Last 24 hours) at 06/09/14  [place] filed at 06/09/14 1903  Gross per 24 hour  Intake    230 ml  Output    200 ml  Net     30 ml   Blood pressure 105/49, pulse 105, temperature 98.5 F (36.9 C), temperature source Axillary, resp. rate 20, height $RemoveBe'6\' 6"'GaDxFDrxc$  (1.981 m), weight 295 lb (133.811 kg), SpO2 93 %. Temp:  [97.1 F (36.2 C)-98.9 F (37.2 C)] 98.5 F (36.9 C) (12/26 1416) Pulse Rate:  [73-120] 105 (12/26 1416) Resp:  [13-27] 20 (12/26 1416) BP: (90-138)/(48-110) 105/49 mmHg (12/26 1416) SpO2:  [89 %-100 %] 93 % (12/26 1355)  Physical Exam: General: Alert and awake, oriented x3,   Having discussion with Dr. Doyle Askew Neuro: nonfocal  CBC:  Recent Labs Lab 06/04/14 0417 06/05/14 0440 06/06/14 0535 06/07/14 0445 06/09/14 0422  HGB 8.3* 8.1* 8.1* 8.1* 8.0*  HCT 26.1* 25.2* 25.7* 25.1* 25.2*  PLT 286 266 313 308 294  INR  --   --    --   --  1.28     BMET  Recent Labs  06/07/14 0445 06/09/14 0422  NA 132* 130*  K 4.5 4.3  CL 98 97  CO2 26 24  GLUCOSE 216* 347*  BUN 25* 36*  CREATININE 1.11 1.37*  CALCIUM 7.8* 7.8*     Liver Panel   Recent Labs  06/07/14 0445 06/09/14 0422  PROT 5.9* 5.6*  ALBUMIN 1.5* 1.5*  AST 182* 157*  ALT 116* 137*  ALKPHOS 302* 413*  BILITOT 1.7* 4.7*       Sedimentation Rate No results for input(s): ESRSEDRATE in the last 72 hours. C-Reactive Protein No results for input(s): CRP in the last 72 hours.  Micro Results: Recent Results (from the past 720 hour(s))  MRSA PCR Screening     Status: None   Collection Time: 05/11/14  6:48 AM  Result Value Ref Range Status   MRSA by PCR NEGATIVE NEGATIVE Final    Comment:        The GeneXpert MRSA Assay (FDA approved for NASAL specimens only), is one component of a comprehensive MRSA colonization surveillance program. It is not intended to diagnose MRSA infection nor to guide or monitor treatment for MRSA infections.   Culture, blood (routine x 2)     Status: None   Collection Time: 05/16/14  3:25 PM  Result Value Ref Range Status   Specimen Description BLOOD RIGHT HAND  Final   Special Requests BOTTLES DRAWN AEROBIC AND ANAEROBIC 10CCS  Final   Culture  Setup Time   Final    05/16/2014 21:17 Performed at [facility]    Culture   Final    NO GROWTH 5 DAYS Performed at [facility]    Report Status 05/22/2014 FINAL  Final  Culture, blood (routine x 2)     Status: None   Collection Time: 05/16/14  3:40 PM  Result Value Ref Range Status   Specimen Description BLOOD RIGHT FOREARM  Final   Special Requests BOTTLES DRAWN AEROBIC AND ANAEROBIC 10CC  Final   Culture  Setup Time   Final    05/16/2014 21:17 Performed at [facility]    Culture   Final    NO GROWTH 5 DAYS Performed at [facility]    Report Status 05/22/2014 FINAL  Final  Clostridium Difficile by PCR      Status: Abnormal   Collection Time: 05/17/14  5:41 PM  Result Value Ref Range Status  C difficile by pcr POSITIVE (A) NEGATIVE Final    Comment: CRITICAL RESULT CALLED TO, READ BACK BY AND VERIFIED WITH: S. HOESLER 11:00 05/18/14 ([email or web address]) S.HOESLER RN    MRSA PCR Screening     Status: None   Collection Time: 05/31/14  4:25 PM  Result Value Ref Range Status   MRSA by PCR NEGATIVE NEGATIVE Final    Comment:        The GeneXpert MRSA Assay (FDA approved for NASAL specimens only), is one component of a comprehensive MRSA colonization surveillance program. It is not intended to diagnose MRSA infection nor to guide or monitor treatment for MRSA infections.   Urine culture     Status: None   Collection Time: 05/31/14  9:15 PM  Result Value Ref Range Status   Specimen Description URINE, RANDOM  Final   Special Requests NONE  Final   Culture  Setup Time   Final    06/01/2014 01:24 Performed at [place]   Final    4,000 COLONIES/ML Performed at [facility]    Culture   Final    INSIGNIFICANT GROWTH Performed at [facility]    Report Status 06/01/2014 FINAL  Final  Culture, blood (routine x 2)     Status: None   Collection Time: 05/31/14 10:59 PM  Result Value Ref Range Status   Specimen Description BLOOD PICC  Final   Special Requests BOTTLES DRAWN AEROBIC ONLY 3ML  Final   Culture  Setup Time   Final    06/01/2014 04:41 Performed at [place]   Final    NO GROWTH 5 DAYS Performed at [facility]    Report Status 06/07/2014 FINAL  Final  Clostridium Difficile by PCR     Status: None   Collection Time: 06/01/14  6:20 AM  Result Value Ref Range Status   C difficile by pcr NEGATIVE NEGATIVE Final    Comment: Performed at [facility]    Studies/Results: Dg Abd 1 View  06/09/2014   CLINICAL DATA:  Biliary stent placement, preceding PTC secondary to pancreatic mass with CBD  obstruction  EXAM: ABDOMEN - 1 VIEW; DG C-ARM 1-60 MIN - NRPT [facility]  COMPARISON:  Two digital C-arm fluoroscopic images obtained during the procedure are submitted for interpretation.  FLUOROSCOPY TIME:  2 min 24 seconds  FINDINGS: First image demonstrates presence of an endoscope with a small amount of contrast and a dilated CBD proximal to the scope.  Second image demonstrates presence of a CBD wall stent with tapered narrowing of the distal aspect of the stent compatible with extrinsic compression by known pancreatic head mass.  IMPRESSION: CBD stenting.   Electronically Signed   By: Lavonia Dana M.D.   On: 06/09/2014 14:26   Dg Abd 1 View  06/08/2014   CLINICAL DATA:  Pancreatic carcinoma  EXAM: DG C-ARM 1-60 MIN - NRPT [facility]; ABDOMEN - 1 VIEW  COMPARISON:  05/31/2014  FINDINGS: Multiple spot films were obtained during and attempted ERCP. Access to the common bile duct was not able to be performed due to the known pancreatic mass.  FLUOROSCOPY TIME:  5 min 6 seconds  IMPRESSION: Inability to cannulate the CBD.   Electronically Signed   By: Inez Catalina M.D.   On: 06/08/2014 12:04   Ir Biliary Dilitation  06/09/2014   CLINICAL DATA:  Duodenum mass, biliary obstruction, hyperbilirubinemia, cholangitis, failed ERCP stent placement  EXAM:  ULTRASOUND GUIDANCE FOR BILIARY ACCESS  PERCUTANEOUS TRANSHEPATIC CHOLANGIOGRAM  SUCCESSFUL INSERTION OF A PERCUTANEOUS CATHETER AND GUIDEWIRE INTO THE DUODENUM (TO SERVE AS ERCP ACCESS AND GUIDANCE FOR STENT PLACEMENT)  Date:  12/26/201512/26/2015 11:29 am  Radiologist:  Jerilynn Mages. Daryll Brod, MD  Guidance:  Ultrasound and fluoroscopic  FLUOROSCOPY TIME:  7 min 36 seconds  MEDICATIONS AND MEDICAL HISTORY: 2 g Rocephin administered within 1 hr of the procedure, 2 mg Versed, 100 mcg fentanyl  ANESTHESIA/SEDATION: 25 min  CONTRAST:  45mL OMNIPAQUE IOHEXOL 300 MG/ML  SOLN  COMPLICATIONS: None immediate  PROCEDURE: Informed consent was obtained from the patient following explanation of  the procedure, risks, benefits and alternatives. The patient understands, agrees and consents for the procedure. All questions were addressed. A time out was performed.  Maximal barrier sterile technique utilized including caps, mask, sterile gowns, sterile gloves, large sterile drape, hand hygiene, and Betadine.  Previous imaging reviewed. Under sterile conditions and local anesthesia, initially a peripheral right hepatic duct was punctured with ultrasound. There was return of bile. Limited initial cholangiogram performed opacifying a right hepatic duct. Under fluoroscopy, the opacified right hepatic duct was punctured percutaneously via a second needle stick. There was return of bile. Guidewire advanced into the biliary system. Accustick dilator set advanced into the common hepatic duct. Contrast injection performed for limited cholangiogram. This demonstrates diffuse biliary dilatation with distal obstruction. Patient is status post cholecystectomy. Catheter and guidewire were advanced distally into the duodenum across the obstruction. Amplatz guidewire and Kumpe catheter remain terminating in the duodenum. This was secured externally with a suture. Access was secured externally to be used for the endoscopic portion of the procedure.  Patient tolerated the procedure well.  No immediate complication.  IMPRESSION: Successful ultrasound and fluoroscopic percutaneous cholangiogram with insertion of a catheter and guidewire across the distal CBD obstruction as described. This will serve as ERCP access/guidance for endoscopic stent insertion later today.  Findings discussed with Dr. Deatra Ina.   Electronically Signed   By: Daryll Brod M.D.   On: 06/09/2014 11:56   Ir Ptc  06/09/2014   CLINICAL DATA:  Duodenum mass, biliary obstruction, hyperbilirubinemia, cholangitis, failed ERCP stent placement  EXAM: ULTRASOUND GUIDANCE FOR BILIARY ACCESS  PERCUTANEOUS TRANSHEPATIC CHOLANGIOGRAM  SUCCESSFUL INSERTION OF A  PERCUTANEOUS CATHETER AND GUIDEWIRE INTO THE DUODENUM (TO SERVE AS ERCP ACCESS AND GUIDANCE FOR STENT PLACEMENT)  Date:  12/26/201512/26/2015 11:29 am  Radiologist:  Jerilynn Mages. Daryll Brod, MD  Guidance:  Ultrasound and fluoroscopic  FLUOROSCOPY TIME:  7 min 36 seconds  MEDICATIONS AND MEDICAL HISTORY: 2 g Rocephin administered within 1 hr of the procedure, 2 mg Versed, 100 mcg fentanyl  ANESTHESIA/SEDATION: 25 min  CONTRAST:  79mL OMNIPAQUE IOHEXOL 300 MG/ML  SOLN  COMPLICATIONS: None immediate  PROCEDURE: Informed consent was obtained from the patient following explanation of the procedure, risks, benefits and alternatives. The patient understands, agrees and consents for the procedure. All questions were addressed. A time out was performed.  Maximal barrier sterile technique utilized including caps, mask, sterile gowns, sterile gloves, large sterile drape, hand hygiene, and Betadine.  Previous imaging reviewed. Under sterile conditions and local anesthesia, initially a peripheral right hepatic duct was punctured with ultrasound. There was return of bile. Limited initial cholangiogram performed opacifying a right hepatic duct. Under fluoroscopy, the opacified right hepatic duct was punctured percutaneously via a second needle stick. There was return of bile. Guidewire advanced into the biliary system. Accustick dilator set advanced into the common hepatic duct. Contrast injection performed  for limited cholangiogram. This demonstrates diffuse biliary dilatation with distal obstruction. Patient is status post cholecystectomy. Catheter and guidewire were advanced distally into the duodenum across the obstruction. Amplatz guidewire and Kumpe catheter remain terminating in the duodenum. This was secured externally with a suture. Access was secured externally to be used for the endoscopic portion of the procedure.  Patient tolerated the procedure well.  No immediate complication.  IMPRESSION: Successful ultrasound and  fluoroscopic percutaneous cholangiogram with insertion of a catheter and guidewire across the distal CBD obstruction as described. This will serve as ERCP access/guidance for endoscopic stent insertion later today.  Findings discussed with Dr. Arlyce Dice.   Electronically Signed   By: Ruel Favors M.D.   On: 06/09/2014 11:56   Ir US Guidance  06/09/2014   CLINICAL DATA:  Duodenum mass, biliary obstruction, hyperbilirubinemia, cholangitis, failed ERCP stent placement  EXAM: ULTRASOUND GUIDANCE FOR BILIARY ACCESS  PERCUTANEOUS TRANSHEPATIC CHOLANGIOGRAM  SUCCESSFUL INSERTION OF A PERCUTANEOUS CATHETER AND GUIDEWIRE INTO THE DUODENUM (TO SERVE AS ERCP ACCESS AND GUIDANCE FOR STENT PLACEMENT)  Date:  12/26/201512/26/2015 11:29 am  Radiologist:  Judie Petit. Ruel Favors, MD  Guidance:  Ultrasound and fluoroscopic  FLUOROSCOPY TIME:  7 min 36 seconds  MEDICATIONS AND MEDICAL HISTORY: 2 g Rocephin administered within 1 hr of the procedure, 2 mg Versed, 100 mcg fentanyl  ANESTHESIA/SEDATION: 25 min  CONTRAST:  54mL OMNIPAQUE IOHEXOL 300 MG/ML  SOLN  COMPLICATIONS: None immediate  PROCEDURE: Informed consent was obtained from the patient following explanation of the procedure, risks, benefits and alternatives. The patient understands, agrees and consents for the procedure. All questions were addressed. A time out was performed.  Maximal barrier sterile technique utilized including caps, mask, sterile gowns, sterile gloves, large sterile drape, hand hygiene, and Betadine.  Previous imaging reviewed. Under sterile conditions and local anesthesia, initially a peripheral right hepatic duct was punctured with ultrasound. There was return of bile. Limited initial cholangiogram performed opacifying a right hepatic duct. Under fluoroscopy, the opacified right hepatic duct was punctured percutaneously via a second needle stick. There was return of bile. Guidewire advanced into the biliary system. Accustick dilator set advanced into the  common hepatic duct. Contrast injection performed for limited cholangiogram. This demonstrates diffuse biliary dilatation with distal obstruction. Patient is status post cholecystectomy. Catheter and guidewire were advanced distally into the duodenum across the obstruction. Amplatz guidewire and Kumpe catheter remain terminating in the duodenum. This was secured externally with a suture. Access was secured externally to be used for the endoscopic portion of the procedure.  Patient tolerated the procedure well.  No immediate complication.  IMPRESSION: Successful ultrasound and fluoroscopic percutaneous cholangiogram with insertion of a catheter and guidewire across the distal CBD obstruction as described. This will serve as ERCP access/guidance for endoscopic stent insertion later today.  Findings discussed with Dr. Arlyce Dice.   Electronically Signed   By: Ruel Favors M.D.   On: 06/09/2014 11:56   Dg C-arm 1-60 Min-no Report  06/09/2014   CLINICAL DATA:  Biliary stent placement, preceding PTC secondary to pancreatic mass with CBD obstruction  EXAM: ABDOMEN - 1 VIEW; DG C-ARM 1-60 MIN - NRPT [facility]  COMPARISON:  Two digital C-arm fluoroscopic images obtained during the procedure are submitted for interpretation.  FLUOROSCOPY TIME:  2 min 24 seconds  FINDINGS: First image demonstrates presence of an endoscope with a small amount of contrast and a dilated CBD proximal to the scope.  Second image demonstrates presence of a CBD wall stent with tapered narrowing  of the distal aspect of the stent compatible with extrinsic compression by known pancreatic head mass.  IMPRESSION: CBD stenting.   Electronically Signed   By: Lavonia Dana M.D.   On: 06/09/2014 14:26   Dg C-arm 1-60 Min-no Report  06/08/2014   CLINICAL DATA:  Pancreatic carcinoma  EXAM: DG C-ARM 1-60 MIN - NRPT [facility]; ABDOMEN - 1 VIEW  COMPARISON:  05/31/2014  FINDINGS: Multiple spot films were obtained during and attempted ERCP. Access to the common bile duct  was not able to be performed due to the known pancreatic mass.  FLUOROSCOPY TIME:  5 min 6 seconds  IMPRESSION: Inability to cannulate the CBD.   Electronically Signed   By: Inez Catalina M.D.   On: 06/08/2014 12:04      Assessment/Plan:  Active Problems:   Sepsis   CA head of pancreas   Obstruction of bile duct   Bile duct obstruction    [name] is a 72 y.o. 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 Deatra Ina. HE has lost IV access  --change to oral augmentin 875/125 bid and give for 10 days  If he worsens off of this may need to broaden again. Would like to avoid fluoroquinolones given CDiff infection recently   LOS: 9 days   Alcide Evener 06/09/2014, 7:45 PM

## 2014-06-09 NOTE — Progress Notes (Signed)
Spoke with patient's wife regarding home cpap.  Per wife, she will assist pt with his home machine after he gets his bedtime meds.  Pt declined additional sterile water for humidity chamber at this time.  Wife was advised that RT is available all night should she need further assistance.

## 2014-06-09 NOTE — H&P (View-Only) (Signed)
Unable to do ERCP because of tumor invading the second portion the duodenum.  The ampulla could not be identified.  Plan PTC in a.m.  If a guidewire can be passed into the ampulla, past the obstruction, then we will do an rendezvous procedure whereby I will place an internal stent at the same sitting. 

## 2014-06-10 LAB — CBC
HEMATOCRIT: 26.6 % — AB (ref 39.0–52.0)
HEMOGLOBIN: 8.4 g/dL — AB (ref 13.0–17.0)
MCH: 28.5 pg (ref 26.0–34.0)
MCHC: 31.6 g/dL (ref 30.0–36.0)
MCV: 90.2 fL (ref 78.0–100.0)
Platelets: 328 10*3/uL (ref 150–400)
RBC: 2.95 MIL/uL — AB (ref 4.22–5.81)
RDW: 16 % — ABNORMAL HIGH (ref 11.5–15.5)
WBC: 24.4 10*3/uL — AB (ref 4.0–10.5)

## 2014-06-10 LAB — GLUCOSE, CAPILLARY
GLUCOSE-CAPILLARY: 324 mg/dL — AB (ref 70–99)
Glucose-Capillary: 263 mg/dL — ABNORMAL HIGH (ref 70–99)
Glucose-Capillary: 314 mg/dL — ABNORMAL HIGH (ref 70–99)

## 2014-06-10 LAB — COMPREHENSIVE METABOLIC PANEL
ALK PHOS: 397 U/L — AB (ref 39–117)
ALT: 100 U/L — AB (ref 0–53)
AST: 76 U/L — ABNORMAL HIGH (ref 0–37)
Albumin: 1.4 g/dL — ABNORMAL LOW (ref 3.5–5.2)
Anion gap: 7 (ref 5–15)
BILIRUBIN TOTAL: 2 mg/dL — AB (ref 0.3–1.2)
BUN: 45 mg/dL — ABNORMAL HIGH (ref 6–23)
CHLORIDE: 98 meq/L (ref 96–112)
CO2: 26 mmol/L (ref 19–32)
Calcium: 7.9 mg/dL — ABNORMAL LOW (ref 8.4–10.5)
Creatinine, Ser: 1.94 mg/dL — ABNORMAL HIGH (ref 0.50–1.35)
GFR calc non Af Amer: 33 mL/min — ABNORMAL LOW (ref >90)
GFR, EST AFRICAN AMERICAN: 38 mL/min — AB (ref >90)
GLUCOSE: 331 mg/dL — AB (ref 70–99)
POTASSIUM: 4.2 mmol/L (ref 3.5–5.1)
SODIUM: 131 mmol/L — AB (ref 135–145)
Total Protein: 5.6 g/dL — ABNORMAL LOW (ref 6.0–8.3)

## 2014-06-10 MED ORDER — ZOLPIDEM TARTRATE 5 MG PO TABS: 5.0000 mg | ORAL_TABLET | Freq: Every evening | ORAL | Status: DC | PRN

## 2014-06-10 MED ORDER — INSULIN ASPART 100 UNIT/ML ~~LOC~~ SOLN
0.0000 [IU] | Freq: Three times a day (TID) | SUBCUTANEOUS | Status: DC
  Administered 2014-06-11: 5 [IU] via SUBCUTANEOUS
  Administered 2014-06-11 – 2014-06-12 (×3): 3 [IU] via SUBCUTANEOUS
  Administered 2014-06-12 (×2): 5 [IU] via SUBCUTANEOUS
  Administered 2014-06-13 (×2): 3 [IU] via SUBCUTANEOUS
  Administered 2014-06-13 – 2014-06-14 (×2): 5 [IU] via SUBCUTANEOUS
  Administered 2014-06-14: 3 [IU] via SUBCUTANEOUS

## 2014-06-10 MED ORDER — DIPHENHYDRAMINE HCL 50 MG PO CAPS: 50.0000 mg | ORAL_CAPSULE | Freq: Every evening | ORAL | Status: DC | PRN

## 2014-06-10 MED ORDER — INSULIN ASPART 100 UNIT/ML ~~LOC~~ SOLN
0.0000 [IU] | Freq: Every day | SUBCUTANEOUS | Status: DC
  Administered 2014-06-10: 5 [IU] via SUBCUTANEOUS

## 2014-06-10 NOTE — Progress Notes (Signed)
Progress Note   Subjective  *Looks and feels better**   Objective  Vital signs in last 24 hours: Temp:  [97.5 F (36.4 C)-98.9 F (37.2 C)] 97.8 F (36.6 C) (12/27 0602) Pulse Rate:  [83-118] 91 (12/27 0602) Resp:  [13-27] 20 (12/27 0602) BP: (90-138)/(45-110) 132/59 mmHg (12/27 0602) SpO2:  [89 %-98 %] 92 % (12/27 0602) Last BM Date: 06/06/14 General:   Alert,  Well-developed,   in NAD Heart:  Regular rate and rhythm; no murmurs Abdomen:  Soft, nontender and nondistended. Normal bowel sounds, without guarding, and without rebound.   Extremities:  Without edema. Neurologic:  Alert and  oriented x4;  grossly normal neurologically. Psych:  Alert and cooperative. Normal mood and affect.  Intake/Output from previous day: 12/26 0701 - 12/27 0700 In: 230 [P.O.:180; IV Piggyback:50] Out: 350 [Urine:350] Intake/Output this shift:    Lab Results:  Recent Labs  06/09/14 0422 06/10/14 0510  WBC 18.4* 24.4*  HGB 8.0* 8.4*  HCT 25.2* 26.6*  PLT 294 328   BMET  Recent Labs  06/09/14 0422 06/10/14 0510  NA 130* 131*  K 4.3 4.2  CL 97 98  CO2 24 26  GLUCOSE 347* 331*  BUN 36* 45*  CREATININE 1.37* 1.94*  CALCIUM 7.8* 7.9*   LFT  Recent Labs  06/10/14 0510  PROT 5.6*  ALBUMIN 1.4*  AST 76*  ALT 100*  ALKPHOS 397*  BILITOT 2.0*   PT/INR  Recent Labs  06/09/14 0422  LABPROT 16.1*  INR 1.28   Hepatitis Panel No results for input(s): HEPBSAG, HCVAB, HEPAIGM, HEPBIGM in the last 72 hours.  Studies/Results: Dg Abd 1 View  06/09/2014   CLINICAL DATA:  Biliary stent placement, preceding PTC secondary to pancreatic mass with CBD obstruction  EXAM: ABDOMEN - 1 VIEW; DG C-ARM 1-60 MIN - NRPT [facility]  COMPARISON:  Two digital C-arm fluoroscopic images obtained during the procedure are submitted for interpretation.  FLUOROSCOPY TIME:  2 min 24 seconds  FINDINGS: First image demonstrates presence of an endoscope with a small amount of contrast and a dilated CBD  proximal to the scope.  Second image demonstrates presence of a CBD wall stent with tapered narrowing of the distal aspect of the stent compatible with extrinsic compression by known pancreatic head mass.  IMPRESSION: CBD stenting.   Electronically Signed   By: Lavonia Dana M.D.   On: 06/09/2014 14:26   Dg Abd 1 View  06/08/2014   CLINICAL DATA:  Pancreatic carcinoma  EXAM: DG C-ARM 1-60 MIN - NRPT [facility]; ABDOMEN - 1 VIEW  COMPARISON:  05/31/2014  FINDINGS: Multiple spot films were obtained during and attempted ERCP. Access to the common bile duct was not able to be performed due to the known pancreatic mass.  FLUOROSCOPY TIME:  5 min 6 seconds  IMPRESSION: Inability to cannulate the CBD.   Electronically Signed   By: Inez Catalina M.D.   On: 06/08/2014 12:04   Ir Biliary Dilitation  06/09/2014   CLINICAL DATA:  Duodenum mass, biliary obstruction, hyperbilirubinemia, cholangitis, failed ERCP stent placement  EXAM: ULTRASOUND GUIDANCE FOR BILIARY ACCESS  PERCUTANEOUS TRANSHEPATIC CHOLANGIOGRAM  SUCCESSFUL INSERTION OF A PERCUTANEOUS CATHETER AND GUIDEWIRE INTO THE DUODENUM (TO SERVE AS ERCP ACCESS AND GUIDANCE FOR STENT PLACEMENT)  Date:  12/26/201512/26/2015 11:29 am  Radiologist:  Jerilynn Mages. Daryll Brod, MD  Guidance:  Ultrasound and fluoroscopic  FLUOROSCOPY TIME:  7 min 36 seconds  MEDICATIONS AND MEDICAL HISTORY: 2 g Rocephin administered within 1 hr  of the procedure, 2 mg Versed, 100 mcg fentanyl  ANESTHESIA/SEDATION: 25 min  CONTRAST:  65m OMNIPAQUE IOHEXOL 300 MG/ML  SOLN  COMPLICATIONS: None immediate  PROCEDURE: Informed consent was obtained from the patient following explanation of the procedure, risks, benefits and alternatives. The patient understands, agrees and consents for the procedure. All questions were addressed. A time out was performed.  Maximal barrier sterile technique utilized including caps, mask, sterile gowns, sterile gloves, large sterile drape, hand hygiene, and Betadine.  Previous  imaging reviewed. Under sterile conditions and local anesthesia, initially a peripheral right hepatic duct was punctured with ultrasound. There was return of bile. Limited initial cholangiogram performed opacifying a right hepatic duct. Under fluoroscopy, the opacified right hepatic duct was punctured percutaneously via a second needle stick. There was return of bile. Guidewire advanced into the biliary system. Accustick dilator set advanced into the common hepatic duct. Contrast injection performed for limited cholangiogram. This demonstrates diffuse biliary dilatation with distal obstruction. Patient is status post cholecystectomy. Catheter and guidewire were advanced distally into the duodenum across the obstruction. Amplatz guidewire and Kumpe catheter remain terminating in the duodenum. This was secured externally with a suture. Access was secured externally to be used for the endoscopic portion of the procedure.  Patient tolerated the procedure well.  No immediate complication.  IMPRESSION: Successful ultrasound and fluoroscopic percutaneous cholangiogram with insertion of a catheter and guidewire across the distal CBD obstruction as described. This will serve as ERCP access/guidance for endoscopic stent insertion later today.  Findings discussed with Dr. KDeatra Ina   Electronically Signed   By: TDaryll BrodM.D.   On: 06/09/2014 11:56   Ir Ptc  06/09/2014   CLINICAL DATA:  Duodenum mass, biliary obstruction, hyperbilirubinemia, cholangitis, failed ERCP stent placement  EXAM: ULTRASOUND GUIDANCE FOR BILIARY ACCESS  PERCUTANEOUS TRANSHEPATIC CHOLANGIOGRAM  SUCCESSFUL INSERTION OF A PERCUTANEOUS CATHETER AND GUIDEWIRE INTO THE DUODENUM (TO SERVE AS ERCP ACCESS AND GUIDANCE FOR STENT PLACEMENT)  Date:  12/26/201512/26/2015 11:29 am  Radiologist:  MJerilynn Mages TDaryll Brod MD  Guidance:  Ultrasound and fluoroscopic  FLUOROSCOPY TIME:  7 min 36 seconds  MEDICATIONS AND MEDICAL HISTORY: 2 g Rocephin administered within 1  hr of the procedure, 2 mg Versed, 100 mcg fentanyl  ANESTHESIA/SEDATION: 25 min  CONTRAST:  258mOMNIPAQUE IOHEXOL 300 MG/ML  SOLN  COMPLICATIONS: None immediate  PROCEDURE: Informed consent was obtained from the patient following explanation of the procedure, risks, benefits and alternatives. The patient understands, agrees and consents for the procedure. All questions were addressed. A time out was performed.  Maximal barrier sterile technique utilized including caps, mask, sterile gowns, sterile gloves, large sterile drape, hand hygiene, and Betadine.  Previous imaging reviewed. Under sterile conditions and local anesthesia, initially a peripheral right hepatic duct was punctured with ultrasound. There was return of bile. Limited initial cholangiogram performed opacifying a right hepatic duct. Under fluoroscopy, the opacified right hepatic duct was punctured percutaneously via a second needle stick. There was return of bile. Guidewire advanced into the biliary system. Accustick dilator set advanced into the common hepatic duct. Contrast injection performed for limited cholangiogram. This demonstrates diffuse biliary dilatation with distal obstruction. Patient is status post cholecystectomy. Catheter and guidewire were advanced distally into the duodenum across the obstruction. Amplatz guidewire and Kumpe catheter remain terminating in the duodenum. This was secured externally with a suture. Access was secured externally to be used for the endoscopic portion of the procedure.  Patient tolerated the procedure well.  No immediate complication.  IMPRESSION: Successful ultrasound and fluoroscopic percutaneous cholangiogram with insertion of a catheter and guidewire across the distal CBD obstruction as described. This will serve as ERCP access/guidance for endoscopic stent insertion later today.  Findings discussed with Dr. Deatra Ina.   Electronically Signed   By: Daryll Brod M.D.   On: 06/09/2014 11:56   Ir US  Guidance  06/09/2014   CLINICAL DATA:  Duodenum mass, biliary obstruction, hyperbilirubinemia, cholangitis, failed ERCP stent placement  EXAM: ULTRASOUND GUIDANCE FOR BILIARY ACCESS  PERCUTANEOUS TRANSHEPATIC CHOLANGIOGRAM  SUCCESSFUL INSERTION OF A PERCUTANEOUS CATHETER AND GUIDEWIRE INTO THE DUODENUM (TO SERVE AS ERCP ACCESS AND GUIDANCE FOR STENT PLACEMENT)  Date:  12/26/201512/26/2015 11:29 am  Radiologist:  Jerilynn Mages. Daryll Brod, MD  Guidance:  Ultrasound and fluoroscopic  FLUOROSCOPY TIME:  7 min 36 seconds  MEDICATIONS AND MEDICAL HISTORY: 2 g Rocephin administered within 1 hr of the procedure, 2 mg Versed, 100 mcg fentanyl  ANESTHESIA/SEDATION: 25 min  CONTRAST:  87m OMNIPAQUE IOHEXOL 300 MG/ML  SOLN  COMPLICATIONS: None immediate  PROCEDURE: Informed consent was obtained from the patient following explanation of the procedure, risks, benefits and alternatives. The patient understands, agrees and consents for the procedure. All questions were addressed. A time out was performed.  Maximal barrier sterile technique utilized including caps, mask, sterile gowns, sterile gloves, large sterile drape, hand hygiene, and Betadine.  Previous imaging reviewed. Under sterile conditions and local anesthesia, initially a peripheral right hepatic duct was punctured with ultrasound. There was return of bile. Limited initial cholangiogram performed opacifying a right hepatic duct. Under fluoroscopy, the opacified right hepatic duct was punctured percutaneously via a second needle stick. There was return of bile. Guidewire advanced into the biliary system. Accustick dilator set advanced into the common hepatic duct. Contrast injection performed for limited cholangiogram. This demonstrates diffuse biliary dilatation with distal obstruction. Patient is status post cholecystectomy. Catheter and guidewire were advanced distally into the duodenum across the obstruction. Amplatz guidewire and Kumpe catheter remain terminating in the  duodenum. This was secured externally with a suture. Access was secured externally to be used for the endoscopic portion of the procedure.  Patient tolerated the procedure well.  No immediate complication.  IMPRESSION: Successful ultrasound and fluoroscopic percutaneous cholangiogram with insertion of a catheter and guidewire across the distal CBD obstruction as described. This will serve as ERCP access/guidance for endoscopic stent insertion later today.  Findings discussed with Dr. KDeatra Ina   Electronically Signed   By: TDaryll BrodM.D.   On: 06/09/2014 11:56   Dg C-arm 1-60 Min-no Report  06/09/2014   CLINICAL DATA:  Biliary stent placement, preceding PTC secondary to pancreatic mass with CBD obstruction  EXAM: ABDOMEN - 1 VIEW; DG C-ARM 1-60 MIN - NRPT [facility]  COMPARISON:  Two digital C-arm fluoroscopic images obtained during the procedure are submitted for interpretation.  FLUOROSCOPY TIME:  2 min 24 seconds  FINDINGS: First image demonstrates presence of an endoscope with a small amount of contrast and a dilated CBD proximal to the scope.  Second image demonstrates presence of a CBD wall stent with tapered narrowing of the distal aspect of the stent compatible with extrinsic compression by known pancreatic head mass.  IMPRESSION: CBD stenting.   Electronically Signed   By: MLavonia DanaM.D.   On: 06/09/2014 14:26   Dg C-arm 1-60 Min-no Report  06/08/2014   CLINICAL DATA:  Pancreatic carcinoma  EXAM: DG C-ARM 1-60 MIN - NRPT [facility]; ABDOMEN - 1 VIEW  COMPARISON:  05/31/2014  FINDINGS: Multiple spot films were obtained during and attempted ERCP. Access to the common bile duct was not able to be performed due to the known pancreatic mass.  FLUOROSCOPY TIME:  5 min 6 seconds  IMPRESSION: Inability to cannulate the CBD.   Electronically Signed   By: Inez Catalina M.D.   On: 06/08/2014 12:04      Assessment & Plan  *1.  Sepsis-very likely from cholangitis.  Status post biliary stent placement 2.  Bile duct  obstruction-status post stent placement  Recommendations  #1 follow-up LFTs.  This may take several days to improve #2 agree with switching to oral antibiotics #3 repeat stool for C. difficile toxin.  This may cause an asymptomatic leukocytosis preceding diarrhea #4 physical therapy.  Patient needs help ambulating**  Active Problems:   Sepsis   CA head of pancreas   Obstruction of bile duct   Bile duct obstruction   Pancreatic cancer   Cholangitis     LOS: 10 days   Erskine Emery  06/10/2014, 10:13 AM

## 2014-06-10 NOTE — Progress Notes (Signed)
Patient ID: [name], male   DOB: 10/06/1940, 73 y.o.   MRN: [id number]  [facility] PROGRESS NOTE  [name] HAL:[id number] DOB: 1941/01/09 DOA: 05/31/2014 PCP: [name], MD  Brief narrative:   Pt is 73 yo very pleasant male with HTN, HLD, DM type II (on insulin and Metformin), pancreatic cancer (follows with Dr. Alen Blew), recently hospitalized for C. Diff and still on oral Vancomycin, now presented to [facility] ED with main concern of several days duration of progressively worsening abdominal pain, intermittent in nature and throbbing, worse on the left side and occasionally but not consistently radiating to the right side, 7/10 in severity when present, with no specific alleviating or aggravating factors. This has been associated with nausea and poor oral intake, several lbs weight loss, fatigue, dyspnea on exertion. He denies any diarrhea or constipation, no blood in stool, no specific urinary concerns, no blood in urine, no fevers, chills,no chest pain.   In ED, pt noted to be in mild distress due to pain. VS notable for BP 85/43 mmHg, HR 113 bpm, RR 22 bpm, T 99.67F. Blood work notable for WBC > 30K, Hg 7.9 (repeat Hg 8.5, prior to transfusion). CT abd with ? Duodenitis. [facility] asked to admit for further evaluation and management of sepsis of unclear etiology. SDU requested. 2 U PRBC requested.   Assessment and Plan: Active Problems: Sepsis - criteria met on admission with tachycardia, leukocytosis, fever, RR 22, hypotension  - appears that this was related to cholangitis, pt now status post biliary stent placement  - now on oral ABX Augmentin (started 12/27) - continued broad spectrum ABX Vanc and Aztreonam for 5 days, transitioned to Rocephin and Flagyl 12/24 Ascending cholangitis with CBD stricture - per GI, unable to do ERCP due to tumor invading second portion of the duodenum - status post biliary stent placement post op day #1 - LFT's trending down  - CMET in  AM Diastolic CHF, acute on chronic  - with significant lower and upper extremity pitting edema - in the setting of hypoalbuminemia, not cardiac issue  - weight on admission 306 lbs --> up to 311 lbs --> 303 lbs --> 295 lbs this AM - placed on Lasix 40 mg IV QD 12/18 and increased frequency to BID on 12/20 to improve diuresis  - will hold lasix for now as Cr is trending up - monitor weight, I's and O's Acute on chronic blood loss anemia, of pancreatic cancer, DM - review of records indicate that Hg is ~9 at baseline - pt denies any blood in stool or urine, FOBT is negative  - cancer is possible source of bleed - repeat Hg 12/17 was as low as 6.3, pt has received 2 U PRBC 12/17 and with appropriate increase in Hg 8.6 - Hg now remains stable with no signs of active bleeding  - IR consulted for port-a-cath placement but this will be on hold until infection resolves Leukocytosis - secondary to cholangitis  - oral Vanc for C. Diff discontinued (pt started on Dec 4th, 2015), completed Dec 19th, 2015 - changed ABX to Augment per ID 12/27 - repeat C. Diff by PCR Acute renal failure - hold Lasix for now - pt less swelling on exam this AM - repeat BMP in AM DM, type II, with complications of anemia and CKD II - A1C 8.8  - continue low dose Lantus for now until oral intake improves - hold Metformin and place on SSI for now  HTN - holding  home medical regimen Metoprolol and Lisinopril - continuing Lasix in an attempt to improve diuresis  RUE edema > LUE, LE edema bilateral  - doppler scan negative for DVT  - edema improving  - holding lasix as noted above  HLD - on statin  CKD stage II - Cr trending up so will hold Lasix today  Pancreatic cancer - appreciate Dr. 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  Family Communication: plan of care discussed with the patient Disposition Plan: Remains inpatient   IV access:   Peripheral IV Procedures and diagnostic studies:    Ct Abdomen Pelvis W Contrast 05/31/2014 Stable 6 cm mass involving the pancreatic head and duodenum, consistent with known pancreatic carcinoma. No evidence of obstruction. Stable mild peripancreatic lymphadenopathy, consistent metastatic disease. Increased hazy opacity surrounding the pancreatic head and duodenum and small amount of fluid within Morison's pouch. This may be related to pancreatitis or duodenitis. Bilateral nonobstructive nephrolithiasis and diverticulosis incidentally noted.   Nm Pet Image Initial (pi) Skull Base To Thigh 05/29/2014 1. Mass along the C-loop of the duodenum adjacent to pancreatic head is intensely hypermetabolic consistent with carcinoma. 2. Adjacent small peripancreatic hypermetabolic lymph node. 3. No additional hypermetabolic adenopathy or metastatic disease.   Dg Abd Acute W/ches 05/31/2014 Unremarkable bowel gas pattern. No obstruction or free air. Left base atelectasis. No lung edema or consolidation.   US Abdomen Limited 06/07/2014 Dilatation of the proximal common bowel duct, with mild central intrahepatic biliary ductal dilatation. This appears grossly stable compared to prior CT. Postcholecystectomy. 2. Heterogeneous hepatic parenchyma without focal lesion. 3. The small amount of ascites on prior CT is not seen. Medical Consultants:   Oncology Dr. Alen Blew   IR for port-a-cath placement  Cardiology for management of anasarca   GI  ID Other Consultants:   None  IAnti-Infectives:    Vancomycin 12/17 --> 12/21  Aztreonam 12/17 --> 12/21  Cipro 12/21 --> 12/24  Rocephin 12/24 --> 12/26  Flagyl 12/24 -->  12/26  Augmentin 12/27 -->   MAGICK-MYERS, ISKRA, MD  [facility] Pager [phone]  If 7PM-7AM, please contact night-coverage [email or web address] Password [email or web address] 06/10/2014, 2:16 PM   LOS: 10 days   HPI/Subjective: No events overnight.   Objective: Filed Vitals:   06/09/14 1355 06/09/14 1416 06/09/14 2133 06/10/14 0602  BP:  105/49 115/45 132/59  Pulse: 105 105 83 91  Temp:  98.5 F (36.9 C) 97.5 F (36.4 C) 97.8 F (36.6 C)  TempSrc:  Axillary Oral Oral  Resp: $Remo'23 20 20 20  'DRYVc$ Height:      Weight:      SpO2: 93%  97% 92%    Intake/Output Summary (Last 24 hours) at 06/10/14 1416 Last data filed at 06/09/14 2003  Gross per 24 hour  Intake    180 ml  Output    350 ml  Net   -170 ml    Exam:   General:  Pt is tired, follows commands appropriately, not in acute distress  Cardiovascular: Regular rate and rhythm,  no rubs, no gallops  Respiratory: Clear to auscultation bilaterally, no wheezing, no crackles, no rhonchi  Abdomen: Soft, non distended, bowel sounds present, no guarding  Extremities: +1 bilateral LE pitting edema, pulses DP and PT  palpable bilaterally  Neuro: Grossly nonfocal  Data Reviewed: Basic Metabolic Panel:  Recent Labs Lab 06/05/14 0440 06/06/14 0535 06/07/14 0445 06/09/14 0422 06/10/14 0510  NA 130* 131* 132* 130* 131*  K 4.6 4.3 4.5 4.3 4.2  CL 98 100 98 97 98  CO2 $Re'24 24 26 24 26  'iqD$ GLUCOSE 222* 146* 216* 347* 331*  BUN 24* 25* 25* 36* 45*  CREATININE 1.07 1.09 1.11 1.37* 1.94*  CALCIUM 7.8* 8.0* 7.8* 7.8* 7.9*   Liver Function Tests:  Recent Labs Lab 06/07/14 0445 06/09/14 0422 06/10/14 0510  AST 182* 157* 76*  ALT 116* 137* 100*  ALKPHOS 302* 413* 397*  BILITOT 1.7* 4.7* 2.0*  PROT 5.9* 5.6* 5.6*  ALBUMIN 1.5* 1.5* 1.4*   CBC:  Recent Labs Lab 06/04/14 0417 06/05/14 0440 06/06/14 0535 06/07/14 0445 06/09/14 0422 06/10/14 0510  WBC 25.0* 17.9* 24.2* 23.4* 18.4* 24.4*  NEUTROABS 20.3*  --   --   --   --   --   HGB 8.3*  8.1* 8.1* 8.1* 8.0* 8.4*  HCT 26.1* 25.2* 25.7* 25.1* 25.2* 26.6*  MCV 92.9 91.6 91.8 90.3 91.3 90.2  PLT 286 266 313 308 294 328   CBG:  Recent Labs Lab 06/09/14 1229 06/09/14 1712 06/09/14 2131 06/10/14 0739 06/10/14 1119  GLUCAP 204* 255* 301* 324* 263*    Recent Results (from the past 240 hour(s))  MRSA PCR Screening     Status: None   Collection Time: 05/31/14  4:25 PM  Result Value Ref Range Status   MRSA by PCR NEGATIVE NEGATIVE Final    Comment:        The GeneXpert MRSA Assay (FDA approved for NASAL specimens only), is one component of a comprehensive MRSA colonization surveillance program. It is not intended to diagnose MRSA infection nor to guide or monitor treatment for MRSA infections.   Urine culture     Status: None   Collection Time: 05/31/14  9:15 PM  Result Value Ref Range Status   Specimen Description URINE, RANDOM  Final   Special Requests NONE  Final   Culture  Setup Time   Final    06/01/2014 01:24 Performed at [place]   Final    4,000 COLONIES/ML Performed at [facility]    Culture   Final    INSIGNIFICANT GROWTH Performed at [facility]    Report Status 06/01/2014 FINAL  Final  Culture, blood (routine x 2)     Status: None   Collection Time: 05/31/14 10:59 PM  Result Value Ref Range Status   Specimen Description BLOOD PICC  Final   Special Requests BOTTLES DRAWN AEROBIC ONLY 3ML  Final   Culture  Setup Time   Final    06/01/2014 04:41 Performed at [place]   Final    NO GROWTH 5 DAYS Performed at [facility]    Report Status 06/07/2014 FINAL  Final  Clostridium Difficile by PCR     Status: None   Collection Time: 06/01/14  6:20 AM  Result Value Ref Range Status   C difficile by pcr NEGATIVE NEGATIVE Final    Comment: Performed at [facility]     Scheduled Meds: . sodium chloride   Intravenous Q72H  . amoxicillin-clavulanate  1 tablet  Oral Q12H  . doxazosin  2 mg Oral QAC breakfast  . feeding supplement (GLUCERNA SHAKE)  237 mL Oral BID BM  .  insulin aspart  0-15 Units Subcutaneous TID WC  . insulin aspart protamine- aspart  55 Units Subcutaneous BID WC  . nystatin  5 mL Oral QID  . pantoprazole  40 mg Oral Daily  . sodium chloride  10-40 mL Intracatheter Q12H   Continuous Infusions:

## 2014-06-10 NOTE — Progress Notes (Signed)
[facility]    Subjective:  Wants to sleep today   Antibiotics:  Anti-infectives    Start     Dose/Rate Route Frequency Ordered Stop   06/10/14 1000  amoxicillin-clavulanate (AUGMENTIN) 875-125 MG per tablet 1 tablet     1 tablet Oral Every 12 hours 06/09/14 2030     06/07/14 1200  metroNIDAZOLE (FLAGYL) IVPB 500 mg  Status:  Discontinued     500 mg100 mL/hr over 60 Minutes Intravenous Every 8 hours 06/07/14 1048 06/09/14 2030   06/07/14 1100  cefTRIAXone (ROCEPHIN) 2 g in dextrose 5 % 50 mL IVPB - Premix  Status:  Discontinued     2 g100 mL/hr over 30 Minutes Intravenous Every 24 hours 06/07/14 1048 06/09/14 2030   06/05/14 1200  fluconazole (DIFLUCAN) tablet 100 mg  Status:  Discontinued     100 mg Oral Daily 06/05/14 0933 06/07/14 1046   06/04/14 2000  ciprofloxacin (CIPRO) tablet 500 mg  Status:  Discontinued     500 mg Oral 2 times daily 06/04/14 1649 06/07/14 0914   06/02/14 1015  fluconazole (DIFLUCAN) IVPB 100 mg  Status:  Discontinued     100 mg50 mL/hr over 60 Minutes Intravenous Every 24 hours 06/02/14 1011 06/02/14 1024   06/01/14 2000  vancomycin (VANCOCIN) 1,750 mg in sodium chloride 0.9 % 500 mL IVPB  Status:  Discontinued     1,750 mg250 mL/hr over 120 Minutes Intravenous Every 24 hours 05/31/14 1634 06/01/14 1249   06/01/14 2000  vancomycin (VANCOCIN) IVPB 1000 mg/200 mL premix  Status:  Discontinued     1,000 mg200 mL/hr over 60 Minutes Intravenous Every 12 hours 06/01/14 1249 06/04/14 1649   06/01/14 1600  fluconazole (DIFLUCAN) IVPB 100 mg  Status:  Discontinued     100 mg50 mL/hr over 60 Minutes Intravenous Every 24 hours 06/01/14 1426 06/05/14 0933   05/31/14 2000  vancomycin (VANCOCIN) 2,500 mg in sodium chloride 0.9 % 500 mL IVPB     2,500 mg250 mL/hr over 120 Minutes Intravenous NOW 05/31/14 1730 05/31/14 2345   05/31/14 1800  vancomycin (VANCOCIN) 50 mg/mL oral solution 125 mg  Status:  Discontinued     125 mg Oral 4 times  daily 05/31/14 1610 06/02/14 1148   05/31/14 1700  aztreonam (AZACTAM) 1 g in dextrose 5 % 50 mL IVPB  Status:  Discontinued     1 g100 mL/hr over 30 Minutes Intravenous 3 times per day 05/31/14 1654 06/04/14 1649   05/31/14 1645  vancomycin (VANCOCIN) 2,500 mg in sodium chloride 0.9 % 500 mL IVPB  Status:  Discontinued     2,500 mg250 mL/hr over 120 Minutes Intravenous NOW 05/31/14 1633 05/31/14 1731      Medications: Scheduled Meds: . sodium chloride   Intravenous Q72H  . amoxicillin-clavulanate  1 tablet Oral Q12H  . doxazosin  2 mg Oral QAC breakfast  . feeding supplement (GLUCERNA SHAKE)  237 mL Oral BID BM  . insulin aspart  0-15 Units Subcutaneous TID WC  . insulin aspart protamine- aspart  55 Units Subcutaneous BID WC  . nystatin  5 mL Oral QID  . pantoprazole  40 mg Oral Daily  . sodium chloride  10-40 mL Intracatheter Q12H   Continuous Infusions:  PRN Meds:.acetaminophen, ALPRAZolam, fentaNYL, magic mouthwash w/lidocaine, magic mouthwash, morphine injection, ondansetron **OR** ondansetron (ZOFRAN) IV, sodium chloride, sodium chloride    Objective: Weight change:   Intake/Output Summary (Last 24 hours) at 06/10/14 1619 Last data filed  at 06/09/14 2003  Gross per 24 hour  Intake    180 ml  Output    350 ml  Net   -170 ml   Blood pressure 115/44, pulse 84, temperature 98.7 F (37.1 C), temperature source Oral, resp. rate 22, height $RemoveBe'6\' 6"'KOqsjHKkd$  (1.981 m), weight 295 lb (133.811 kg), SpO2 92 %. Temp:  [97.5 F (36.4 C)-98.7 F (37.1 C)] 98.7 F (37.1 C) (12/27 1501) Pulse Rate:  [83-91] 84 (12/27 1501) Resp:  [20-22] 22 (12/27 1501) BP: (115-132)/(44-59) 115/44 mmHg (12/27 1501) SpO2:  [92 %-97 %] 92 % (12/27 0602)  Physical Exam: General: Alert and awake, oriented x3,  GI: abdomen with diffuse tenderness, greater in RUQ Neuro: nonfocal  CBC:  Recent Labs Lab 06/05/14 0440 06/06/14 0535 06/07/14 0445 06/09/14 0422 06/10/14 0510  HGB 8.1* 8.1* 8.1* 8.0* 8.4*    HCT 25.2* 25.7* 25.1* 25.2* 26.6*  PLT 266 313 308 294 328  INR  --   --   --  1.28  --      BMET  Recent Labs  06/09/14 0422 06/10/14 0510  NA 130* 131*  K 4.3 4.2  CL 97 98  CO2 24 26  GLUCOSE 347* 331*  BUN 36* 45*  CREATININE 1.37* 1.94*  CALCIUM 7.8* 7.9*     Liver Panel   Recent Labs  06/09/14 0422 06/10/14 0510  PROT 5.6* 5.6*  ALBUMIN 1.5* 1.4*  AST 157* 76*  ALT 137* 100*  ALKPHOS 413* 397*  BILITOT 4.7* 2.0*       Sedimentation Rate No results for input(s): ESRSEDRATE in the last 72 hours. C-Reactive Protein No results for input(s): CRP in the last 72 hours.  Micro Results: Recent Results (from the past 720 hour(s))  Culture, blood (routine x 2)     Status: None   Collection Time: 05/16/14  3:25 PM  Result Value Ref Range Status   Specimen Description BLOOD RIGHT HAND  Final   Special Requests BOTTLES DRAWN AEROBIC AND ANAEROBIC 10CCS  Final   Culture  Setup Time   Final    05/16/2014 21:17 Performed at [facility]    Culture   Final    NO GROWTH 5 DAYS Performed at [facility]    Report Status 05/22/2014 FINAL  Final  Culture, blood (routine x 2)     Status: None   Collection Time: 05/16/14  3:40 PM  Result Value Ref Range Status   Specimen Description BLOOD RIGHT FOREARM  Final   Special Requests BOTTLES DRAWN AEROBIC AND ANAEROBIC 10CC  Final   Culture  Setup Time   Final    05/16/2014 21:17 Performed at [facility]   Final    NO GROWTH 5 DAYS Performed at [facility]    Report Status 05/22/2014 FINAL  Final  Clostridium Difficile by PCR     Status: Abnormal   Collection Time: 05/17/14  5:41 PM  Result Value Ref Range Status   C difficile by pcr POSITIVE (A) NEGATIVE Final    Comment: CRITICAL RESULT CALLED TO, READ BACK BY AND VERIFIED WITH: S. HOESLER 11:00 05/18/14 ([email or web address]) S.HOESLER RN    MRSA PCR Screening     Status: None   Collection Time: 05/31/14  4:25 PM   Result Value Ref Range Status   MRSA by PCR NEGATIVE NEGATIVE Final    Comment:        The GeneXpert MRSA Assay (FDA approved for NASAL specimens only),  is one component of a comprehensive MRSA colonization surveillance program. It is not intended to diagnose MRSA infection nor to guide or monitor treatment for MRSA infections.   Urine culture     Status: None   Collection Time: 05/31/14  9:15 PM  Result Value Ref Range Status   Specimen Description URINE, RANDOM  Final   Special Requests NONE  Final   Culture  Setup Time   Final    06/01/2014 01:24 Performed at [facility]   Final    4,000 COLONIES/ML Performed at [facility]    Culture   Final    INSIGNIFICANT GROWTH Performed at [facility]    Report Status 06/01/2014 FINAL  Final  Culture, blood (routine x 2)     Status: None   Collection Time: 05/31/14 10:59 PM  Result Value Ref Range Status   Specimen Description BLOOD PICC  Final   Special Requests BOTTLES DRAWN AEROBIC ONLY  Final   Culture  Setup Time   Final    06/01/2014 04:41 Performed at [facility]    Culture   Final    NO GROWTH 5 DAYS Performed at [facility]    Report Status 06/07/2014 FINAL  Final  Clostridium Difficile by PCR     Status: None   Collection Time: 06/01/14  6:20 AM  Result Value Ref Range Status   C difficile by pcr NEGATIVE NEGATIVE Final    Comment: Performed at [facility]    Studies/Results: Dg Abd 1 View  06/09/2014   CLINICAL DATA:  Biliary stent placement, preceding PTC secondary to pancreatic mass with CBD obstruction  EXAM: ABDOMEN - 1 VIEW; DG C-ARM 1-60 MIN - NRPT [facility]  COMPARISON:  Two digital C-arm fluoroscopic images obtained during the procedure are submitted for interpretation.  FLUOROSCOPY TIME:  2 min 24 seconds  FINDINGS: First image demonstrates presence of an endoscope with a small amount of contrast and a dilated CBD proximal to the  scope.  Second image demonstrates presence of a CBD wall stent with tapered narrowing of the distal aspect of the stent compatible with extrinsic compression by known pancreatic head mass.  IMPRESSION: CBD stenting.   Electronically Signed   By: Ulyses Southward M.D.   On: 06/09/2014 14:26   Ir Biliary Dilitation  06/09/2014   CLINICAL DATA:  Duodenum mass, biliary obstruction, hyperbilirubinemia, cholangitis, failed ERCP stent placement  EXAM: ULTRASOUND GUIDANCE FOR BILIARY ACCESS  PERCUTANEOUS TRANSHEPATIC CHOLANGIOGRAM  SUCCESSFUL INSERTION OF A PERCUTANEOUS CATHETER AND GUIDEWIRE INTO THE DUODENUM (TO SERVE AS ERCP ACCESS AND GUIDANCE FOR STENT PLACEMENT)  Date:  12/26/201512/26/2015 11:29 am  Radiologist:  Judie Petit. Ruel Favors, MD  Guidance:  Ultrasound and fluoroscopic  FLUOROSCOPY TIME:  7 min 36 seconds  MEDICATIONS AND MEDICAL HISTORY: 2 g Rocephin administered within 1 hr of the procedure, 2 mg Versed, 100 mcg fentanyl  ANESTHESIA/SEDATION: 25 min  CONTRAST:  40mL OMNIPAQUE IOHEXOL 300 MG/ML  SOLN  COMPLICATIONS: None immediate  PROCEDURE: Informed consent was obtained from the patient following explanation of the procedure, risks, benefits and alternatives. The patient understands, agrees and consents for the procedure. All questions were addressed. A time out was performed.  Maximal barrier sterile technique utilized including caps, mask, sterile gowns, sterile gloves, large sterile drape, hand hygiene, and Betadine.  Previous imaging reviewed. Under sterile conditions and local anesthesia, initially a peripheral right hepatic duct was punctured with ultrasound. There was return  of bile. Limited initial cholangiogram performed opacifying a right hepatic duct. Under fluoroscopy, the opacified right hepatic duct was punctured percutaneously via a second needle stick. There was return of bile. Guidewire advanced into the biliary system. Accustick dilator set advanced into the common hepatic duct. Contrast  injection performed for limited cholangiogram. This demonstrates diffuse biliary dilatation with distal obstruction. Patient is status post cholecystectomy. Catheter and guidewire were advanced distally into the duodenum across the obstruction. Amplatz guidewire and Kumpe catheter remain terminating in the duodenum. This was secured externally with a suture. Access was secured externally to be used for the endoscopic portion of the procedure.  Patient tolerated the procedure well.  No immediate complication.  IMPRESSION: Successful ultrasound and fluoroscopic percutaneous cholangiogram with insertion of a catheter and guidewire across the distal CBD obstruction as described. This will serve as ERCP access/guidance for endoscopic stent insertion later today.  Findings discussed with Dr. Deatra Ina.   Electronically Signed   By: Daryll Brod M.D.   On: 06/09/2014 11:56   Ir Ptc  06/09/2014   CLINICAL DATA:  Duodenum mass, biliary obstruction, hyperbilirubinemia, cholangitis, failed ERCP stent placement  EXAM: ULTRASOUND GUIDANCE FOR BILIARY ACCESS  PERCUTANEOUS TRANSHEPATIC CHOLANGIOGRAM  SUCCESSFUL INSERTION OF A PERCUTANEOUS CATHETER AND GUIDEWIRE INTO THE DUODENUM (TO SERVE AS ERCP ACCESS AND GUIDANCE FOR STENT PLACEMENT)  Date:  12/26/201512/26/2015 11:29 am  Radiologist:  Jerilynn Mages. Daryll Brod, MD  Guidance:  Ultrasound and fluoroscopic  FLUOROSCOPY TIME:  7 min 36 seconds  MEDICATIONS AND MEDICAL HISTORY: 2 g Rocephin administered within 1 hr of the procedure, 2 mg Versed, 100 mcg fentanyl  ANESTHESIA/SEDATION: 25 min  CONTRAST:  26mL OMNIPAQUE IOHEXOL 300 MG/ML  SOLN  COMPLICATIONS: None immediate  PROCEDURE: Informed consent was obtained from the patient following explanation of the procedure, risks, benefits and alternatives. The patient understands, agrees and consents for the procedure. All questions were addressed. A time out was performed.  Maximal barrier sterile technique utilized including caps, mask,  sterile gowns, sterile gloves, large sterile drape, hand hygiene, and Betadine.  Previous imaging reviewed. Under sterile conditions and local anesthesia, initially a peripheral right hepatic duct was punctured with ultrasound. There was return of bile. Limited initial cholangiogram performed opacifying a right hepatic duct. Under fluoroscopy, the opacified right hepatic duct was punctured percutaneously via a second needle stick. There was return of bile. Guidewire advanced into the biliary system. Accustick dilator set advanced into the common hepatic duct. Contrast injection performed for limited cholangiogram. This demonstrates diffuse biliary dilatation with distal obstruction. Patient is status post cholecystectomy. Catheter and guidewire were advanced distally into the duodenum across the obstruction. Amplatz guidewire and Kumpe catheter remain terminating in the duodenum. This was secured externally with a suture. Access was secured externally to be used for the endoscopic portion of the procedure.  Patient tolerated the procedure well.  No immediate complication.  IMPRESSION: Successful ultrasound and fluoroscopic percutaneous cholangiogram with insertion of a catheter and guidewire across the distal CBD obstruction as described. This will serve as ERCP access/guidance for endoscopic stent insertion later today.  Findings discussed with Dr. Deatra Ina.   Electronically Signed   By: Daryll Brod M.D.   On: 06/09/2014 11:56   Ir US Guidance  06/09/2014   CLINICAL DATA:  Duodenum mass, biliary obstruction, hyperbilirubinemia, cholangitis, failed ERCP stent placement  EXAM: ULTRASOUND GUIDANCE FOR BILIARY ACCESS  PERCUTANEOUS TRANSHEPATIC CHOLANGIOGRAM  SUCCESSFUL INSERTION OF A PERCUTANEOUS CATHETER AND GUIDEWIRE INTO THE DUODENUM (TO SERVE AS ERCP ACCESS AND  GUIDANCE FOR STENT PLACEMENT)  Date:  12/26/201512/26/2015 11:29 am  Radiologist:  M. Ruel Favors, MD  Guidance:  Ultrasound and fluoroscopic   FLUOROSCOPY TIME:  7 min 36 seconds  MEDICATIONS AND MEDICAL HISTORY: 2 g Rocephin administered within 1 hr of the procedure, 2 mg Versed, 100 mcg fentanyl  ANESTHESIA/SEDATION: 25 min  CONTRAST:  2mL OMNIPAQUE IOHEXOL 300 MG/ML  SOLN  COMPLICATIONS: None immediate  PROCEDURE: Informed consent was obtained from the patient following explanation of the procedure, risks, benefits and alternatives. The patient understands, agrees and consents for the procedure. All questions were addressed. A time out was performed.  Maximal barrier sterile technique utilized including caps, mask, sterile gowns, sterile gloves, large sterile drape, hand hygiene, and Betadine.  Previous imaging reviewed. Under sterile conditions and local anesthesia, initially a peripheral right hepatic duct was punctured with ultrasound. There was return of bile. Limited initial cholangiogram performed opacifying a right hepatic duct. Under fluoroscopy, the opacified right hepatic duct was punctured percutaneously via a second needle stick. There was return of bile. Guidewire advanced into the biliary system. Accustick dilator set advanced into the common hepatic duct. Contrast injection performed for limited cholangiogram. This demonstrates diffuse biliary dilatation with distal obstruction. Patient is status post cholecystectomy. Catheter and guidewire were advanced distally into the duodenum across the obstruction. Amplatz guidewire and Kumpe catheter remain terminating in the duodenum. This was secured externally with a suture. Access was secured externally to be used for the endoscopic portion of the procedure.  Patient tolerated the procedure well.  No immediate complication.  IMPRESSION: Successful ultrasound and fluoroscopic percutaneous cholangiogram with insertion of a catheter and guidewire across the distal CBD obstruction as described. This will serve as ERCP access/guidance for endoscopic stent insertion later today.  Findings discussed  with Dr. Arlyce Dice.   Electronically Signed   By: Ruel Favors M.D.   On: 06/09/2014 11:56   Dg C-arm 1-60 Min-no Report  06/09/2014   CLINICAL DATA:  Biliary stent placement, preceding PTC secondary to pancreatic mass with CBD obstruction  EXAM: ABDOMEN - 1 VIEW; DG C-ARM 1-60 MIN - NRPT [facility]  COMPARISON:  Two digital C-arm fluoroscopic images obtained during the procedure are submitted for interpretation.  FLUOROSCOPY TIME:  2 min 24 seconds  FINDINGS: First image demonstrates presence of an endoscope with a small amount of contrast and a dilated CBD proximal to the scope.  Second image demonstrates presence of a CBD wall stent with tapered narrowing of the distal aspect of the stent compatible with extrinsic compression by known pancreatic head mass.  IMPRESSION: CBD stenting.   Electronically Signed   By: Ulyses Southward M.D.   On: 06/09/2014 14:26      Assessment/Plan:  Active Problems:   Sepsis   CA head of pancreas   Obstruction of bile duct   Bile duct obstruction   Pancreatic cancer   Cholangitis    CHRISTOP HIPPERT is a 73 y.o. 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 Arlyce Dice. HE has lost IV access yesterday and changed him to po augmentin  Cholangitis:  --change to oral augmentin 875/125 bid and give for 10 days --continue to observe on augmentin  Hx of Clotridium Difficile:   --I WOULD NOT CHECK C DIFF PCR if he does have have more than 5 BM a day unless we have evidence for ileus which I do not see.  He certainly is colonized with C diff  And if we check C diff PCR on normal stool we may mistakenly diagnose infection which clinically he does not yet have. I agree his WBC is certainly high as can be seen with CDiff but I think he has mx other things to explain this leukocytosis    LOS: 10 days   Alcide Evener 06/10/2014, 4:19 PM

## 2014-06-10 NOTE — Progress Notes (Signed)
PT Cancellation Note  Patient Details Name: [name] MRN: [id number] DOB: 10-22-1940   Cancelled Treatment:    Spoke with nurse, pt very groggy and even with turning for bedpan and rolling, pt was very groggy. Will try to check back this afternoon if time allows.if not tomorrow .    Clide Dales 06/10/2014, 10:53 AM  Clide Dales, PT Pager: [phone] 06/10/2014

## 2014-06-10 NOTE — Progress Notes (Signed)
Family requesting patient to be taken off Ambien at night and take 2 Benadryl instead. MD, please advise.

## 2014-06-10 NOTE — Progress Notes (Signed)
Pt has already placed self on home cpap, no assistance needed.

## 2014-06-10 NOTE — Progress Notes (Signed)
Family wants to be sure pt gets to his radiation appointment on Monday, December 28,2015.

## 2014-06-11 ENCOUNTER — Encounter (HOSPITAL_COMMUNITY): Payer: Self-pay | Admitting: Gastroenterology

## 2014-06-11 ENCOUNTER — Ambulatory Visit
Admission: RE | Admit: 2014-06-11 | Discharge: 2014-06-11 | Disposition: A | Discharge status: Home / Self Care | Payer: Medicare Other | Source: Ambulatory Visit | Attending: Radiation Oncology | Admitting: Radiation Oncology

## 2014-06-11 LAB — COMPREHENSIVE METABOLIC PANEL
ALK PHOS: 293 U/L — AB (ref 39–117)
ALT: 59 U/L — ABNORMAL HIGH (ref 0–53)
ANION GAP: 8 (ref 5–15)
AST: 33 U/L (ref 0–37)
Albumin: 1.6 g/dL — ABNORMAL LOW (ref 3.5–5.2)
BUN: 51 mg/dL — AB (ref 6–23)
CO2: 24 mmol/L (ref 19–32)
Calcium: 7.6 mg/dL — ABNORMAL LOW (ref 8.4–10.5)
Chloride: 98 mEq/L (ref 96–112)
Creatinine, Ser: 1.91 mg/dL — ABNORMAL HIGH (ref 0.50–1.35)
GFR calc Af Amer: 39 mL/min — ABNORMAL LOW (ref >90)
GFR, EST NON AFRICAN AMERICAN: 33 mL/min — AB (ref >90)
Glucose, Bld: 241 mg/dL — ABNORMAL HIGH (ref 70–99)
Potassium: 4 mmol/L (ref 3.5–5.1)
Sodium: 130 mmol/L — ABNORMAL LOW (ref 135–145)
TOTAL PROTEIN: 5.6 g/dL — AB (ref 6.0–8.3)
Total Bilirubin: 1.2 mg/dL (ref 0.3–1.2)

## 2014-06-11 LAB — CBC
HCT: 25.8 % — ABNORMAL LOW (ref 39.0–52.0)
Hemoglobin: 8.4 g/dL — ABNORMAL LOW (ref 13.0–17.0)
MCH: 29.1 pg (ref 26.0–34.0)
MCHC: 32.6 g/dL (ref 30.0–36.0)
MCV: 89.3 fL (ref 78.0–100.0)
Platelets: 323 10*3/uL (ref 150–400)
RBC: 2.89 MIL/uL — ABNORMAL LOW (ref 4.22–5.81)
RDW: 16.2 % — AB (ref 11.5–15.5)
WBC: 29 10*3/uL — ABNORMAL HIGH (ref 4.0–10.5)

## 2014-06-11 LAB — GLUCOSE, CAPILLARY
GLUCOSE-CAPILLARY: 351 mg/dL — AB (ref 70–99)
GLUCOSE-CAPILLARY: 193 mg/dL — AB (ref 70–99)
Glucose-Capillary: 224 mg/dL — ABNORMAL HIGH (ref 70–99)
Glucose-Capillary: 185 mg/dL — ABNORMAL HIGH (ref 70–99)

## 2014-06-11 NOTE — Progress Notes (Signed)
    Progress Note   Subjective  no specific complaints   Objective   Vital signs in last 24 hours: Temp:  [97.9 F (36.6 C)-98.7 F (37.1 C)] 98 F (36.7 C) (12/28 0533) Pulse Rate:  [84-86] 86 (12/28 0533) Resp:  [20-22] 20 (12/28 0533) BP: (105-127)/(43-56) 105/56 mmHg (12/28 0533) SpO2:  [92 %-95 %] 95 % (12/28 0533) Last BM Date: 06/06/14 General:    white male in NAD Heart:  Regular rate and rhythm Abdomen:  Soft, nontender and nondistended. Normal bowel sounds. Neurologic:  Alert and oriented,  grossly normal neurologically. Psych:  Cooperative. Normal mood and affect.  Lab Results:  Recent Labs  06/09/14 0422 06/10/14 0510 06/11/14 0525  WBC 18.4* 24.4* 29.0*  HGB 8.0* 8.4* 8.4*  HCT 25.2* 26.6* 25.8*  PLT 294 328 323   BMET  Recent Labs  06/09/14 0422 06/10/14 0510 06/11/14 0525  NA 130* 131* 130*  K 4.3 4.2 4.0  CL 97 98 98  CO2 24 26 24   GLUCOSE 347* 331* 241*  BUN 36* 45* 51*  CREATININE 1.37* 1.94* 1.91*  CALCIUM 7.8* 7.9* 7.6*   LFT  Recent Labs  06/11/14 0525  PROT 5.6*  ALBUMIN 1.6*  AST 33  ALT 59*  ALKPHOS 293*  BILITOT 1.2   PT/INR  Recent Labs  06/09/14 0422  LABPROT 16.1*  INR 1.28     Assessment / Plan:   73 year old male with pancreatic cancer complicated by biliary obstruction / cholangitis. He is s/p metal biliary stent placement. LFTs improving. On Augmentin.. WBC still rising. He had C-diff but hasn't had any diarrhea in a couple of days so not likely active infection.  ID following     LOS: 11 days   Tye Savoy  06/11/2014, 9:06 AM   [facility] Attending  I have also seen and assessed the patient and agree with the above note.  He has responded appropriately to the stent with normalization of bilirubin. Overall better with elevation of WBC increasing. Agree with Dr. Alen Blew that is likely reactive in nature and would continue Abx. We will be available if needed.  Gatha Mayer, MD, [facility] [phone] (pager) 06/11/2014 12:25 PM

## 2014-06-11 NOTE — Progress Notes (Signed)
NUTRITION FOLLOW-UP       INTERVENTION: -Continue Glucerna Shake po BID, each supplement provides 220 kcal and 10 grams of protein -RD to continue to monitor  NUTRITION DIAGNOSIS: Inadequate oral intake related to abd pain/loose stools as evidenced by PO intake < 75%, ongoing  Goal: Pt to meet >/= 90% of their estimated nutrition needs, unmet    Monitor:  Total protein/energy intake, labs, weights, GI profile  ASSESSMENT: Pt is 73 yo very pleasant male with HTN, HLD, DM type II (on insulin and Metformin), pancreatic cancer (follows with Dr. Alen Blew), recently hospitalized for C. Diff and still on oral Vancomycin, now presented to [facility] ED with main concern of several days duration of progressively worsening abdominal pain,  PO intake: 75%. Pt currently on FL diet. Pt is ordered Glucerna Shakes BID, pt has been too lethargic to drink them the past 2 days post biliary stent placement.  Provided Glucerna Shake for pt while wife was in the room. Wife assured RD that pt would drink supplement. Wife states that she will order patient's lunch.  Labs reviewed: Low Na Elevated BUN & Creatinine Glucose 241 LFTs trending down  Height: Ht Readings from Last 1 Encounters:  05/31/14 6\' 6"  (1.981 m)    Weight: Wt Readings from Last 1 Encounters:  06/07/14 295 lb (133.811 kg)  12/16  303 lb   BMI:  Body mass index is 34.1 kg/(m^2).  Estimated Nutritional Needs: Kcal: 2400-2600 Protein: 115-130 gram Fluid: >/= 2400 ml daily  Skin: WDL  Diet Order: Diet full liquid  EDUCATION NEEDS: -No education needs identified at this time   Intake/Output Summary (Last 24 hours) at 06/11/14 0942 Last data filed at 06/10/14 2119  Gross per 24 hour  Intake    360 ml  Output    250 ml  Net    110 ml    Last BM: 12/23  Labs:   Recent Labs Lab 06/09/14 0422 06/10/14 0510 06/11/14 0525  NA 130* 131* 130*  K 4.3 4.2 4.0  CL 97 98 98  CO2 24 26 24   BUN 36* 45* 51*  CREATININE  1.37* 1.94* 1.91*  CALCIUM 7.8* 7.9* 7.6*  GLUCOSE 347* 331* 241*    CBG (last 3)   Recent Labs  06/10/14 1649 06/10/14 2118 06/11/14 0741  GLUCAP 314* 351* 224*    Scheduled Meds: . sodium chloride   Intravenous Q72H  . amoxicillin-clavulanate  1 tablet Oral Q12H  . doxazosin  2 mg Oral QAC breakfast  . feeding supplement (GLUCERNA SHAKE)  237 mL Oral BID BM  . insulin aspart  0-15 Units Subcutaneous TID WC  . insulin aspart  0-5 Units Subcutaneous QHS  . insulin aspart protamine- aspart  55 Units Subcutaneous BID WC  . nystatin  5 mL Oral QID  . pantoprazole  40 mg Oral Daily  . sodium chloride  10-40 mL Intracatheter Q12H    Continuous Infusions:    Clayton Bibles, MS, RD, LDN Pager: [phone] After Hours Pager: [phone]

## 2014-06-11 NOTE — Progress Notes (Signed)
Inpatient Diabetes Program Recommendations  AACE/ADA: New Consensus Statement on Inpatient Glycemic Control (2013)  Target Ranges:  Prepandial:   less than 140 mg/dL      Peak postprandial:   less than 180 mg/dL (1-2 hours)      Critically ill patients:  140 - 180 mg/dL     Results for [name], [name] (MRN [id number]) as of 06/11/2014 11:18  Ref. Range 06/10/2014 07:39 06/10/2014 11:19 06/10/2014 16:49 06/10/2014 21:18  Glucose-Capillary Latest Range: 70-99 mg/dL 324 (H) 263 (H) 314 (H) 351 (H)    Results for [name], [name] (MRN [id number]) as of 06/11/2014 11:18  Ref. Range 06/11/2014 07:41  Glucose-Capillary Latest Range: 70-99 mg/dL 224 (H)     Home DM Meds: 70/30 insulin- 75 units AM/ 85 units PM  Metformin 500 mg QPM   Current Insulin Orders: 70/30 insulin- 55 units bidwc  Novolog Moderate SSI    MD- Please consider increasing current doses of 70/30 insulin to 65 units bid with meals  Please also consider increasing SSI to Resistant scale tid ac + HS    Will follow Wyn Quaker RN, MSN, CDE Diabetes Coordinator Inpatient Diabetes Program Team Pager: [phone] (8a-10p)

## 2014-06-11 NOTE — Progress Notes (Signed)
Patient ID: [name], male   DOB: 1940-07-11, 73 y.o.   MRN: [id number]  [facility] PROGRESS NOTE  ELWARD NOCERA UUV:[id number] DOB: 15-May-1941 DOA: 05/31/2014 PCP: [name], MD   Brief narrative:   Pt is 73 yo very pleasant male with HTN, HLD, DM type II (on insulin and Metformin), pancreatic cancer (follows with Dr. Alen Blew), recently hospitalized for C. Diff and still on oral Vancomycin, now presented to [facility] ED with main concern of several days duration of progressively worsening abdominal pain, intermittent in nature and throbbing, worse on the left side and occasionally but not consistently radiating to the right side, 7/10 in severity when present, with no specific alleviating or aggravating factors. This has been associated with nausea and poor oral intake, several lbs weight loss, fatigue, dyspnea on exertion. He denies any diarrhea or constipation, no blood in stool, no specific urinary concerns, no blood in urine, no fevers, chills,no chest pain.   In ED, pt noted to be in mild distress due to pain. VS notable for BP 85/43 mmHg, HR 113 bpm, RR 22 bpm, T 99.34F. Blood work notable for WBC > 30K, Hg 7.9 (repeat Hg 8.5, prior to transfusion). CT abd with ? Duodenitis. TR[facility] asked to admit for further evaluation and management of sepsis of unclear etiology. SDU requested. 2 U PRBC requested.   Assessment and Plan: Active Problems: Sepsis - criteria met on admission with tachycardia, leukocytosis, fever, RR 22, hypotension  - appears that this was related to cholangitis, pt now status post biliary stent placement  - now on oral ABX Augmentin (started 12/27) - continued broad spectrum ABX Vanc and Aztreonam for 5 days Ascending cholangitis with CBD stricture - per GI, unable to do ERCP due to tumor invading second portion of the duodenum - status post biliary stent placement post op day #2, normalization of bili - LFT's overall trending down  - CMET in  AM Diastolic CHF, acute on chronic  - with significant lower and upper extremity pitting edema - in the setting of hypoalbuminemia, not cardiac issue  - weight on admission 306 lbs --> up to 311 lbs --> 303 lbs --> 295 lbs this AM - placed on Lasix 40 mg IV QD 12/18 and increased frequency to BID on 12/20 to improve diuresis  - will hold lasix for now as Cr is still up - monitor weight, I's and O's Acute on chronic blood loss anemia, of pancreatic cancer, DM - review of records indicate that Hg is ~9 at baseline - pt denies any blood in stool or urine, FOBT is negative  - cancer is possible source of bleed - repeat Hg 12/17 was as low as 6.3, pt has received 2 U PRBC 12/17 and with appropriate increase in Hg 8.6 - Hg now remains stable with no signs of active bleeding  - IR consulted for port-a-cath placement but this will be on hold until infection resolves Leukocytosis - secondary to cholangitis  - oral Vanc for C. Diff discontinued (pt started on Dec 4th, 2015), completed Dec 19th, 2015 - changed ABX to Augment per ID 12/27 - repeat C. Diff by PCR Acute renal failure - hold Lasix for now - pt less swelling on exam this AM - repeat BMP in AM DM, type II, with complications of anemia and CKD II - A1C 8.8  - continue low dose Lantus for now until oral intake improves - hold Metformin and place on SSI for now  HTN - holding home  medical regimen Metoprolol and Lisinopril - continuing Lasix in an attempt to improve diuresis  RUE edema > LUE, LE edema bilateral  - doppler scan negative for DVT  - edema improving  - holding lasix as noted above  HLD - on statin  CKD stage II - Cr trending up so will hold Lasix today  Pancreatic cancer - appreciate Dr. 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  Family Communication: plan of care discussed with the patient Disposition Plan: Remains inpatient   IV access:   Peripheral IV Procedures and diagnostic studies:    Ct Abdomen Pelvis W Contrast 05/31/2014 Stable 6 cm mass involving the pancreatic head and duodenum, consistent with known pancreatic carcinoma. No evidence of obstruction. Stable mild peripancreatic lymphadenopathy, consistent metastatic disease. Increased hazy opacity surrounding the pancreatic head and duodenum and small amount of fluid within Morison's pouch. This may be related to pancreatitis or duodenitis. Bilateral nonobstructive nephrolithiasis and diverticulosis incidentally noted.   Nm Pet Image Initial (pi) Skull Base To Thigh 05/29/2014 1. Mass along the C-loop of the duodenum adjacent to pancreatic head is intensely hypermetabolic consistent with carcinoma. 2. Adjacent small peripancreatic hypermetabolic lymph node. 3. No additional hypermetabolic adenopathy or metastatic disease.   Dg Abd Acute W/ches 05/31/2014 Unremarkable bowel gas pattern. No obstruction or free air. Left base atelectasis. No lung edema or consolidation.   US Abdomen Limited 06/07/2014 Dilatation of the proximal common bowel duct, with mild central intrahepatic biliary ductal dilatation. This appears grossly stable compared to prior CT. Postcholecystectomy. 2. Heterogeneous hepatic parenchyma without focal lesion. 3. The small amount of ascites on prior CT is not seen. Medical Consultants:   Oncology Dr. Alen Blew   IR for port-a-cath placement  Cardiology for management of anasarca   GI  ID Other Consultants:   None  IAnti-Infectives:    Vancomycin 12/17 --> 12/21  Aztreonam 12/17 --> 12/21  Cipro 12/21 --> 12/24  Rocephin 12/24 --> 12/26  Flagyl 12/24 -->  12/26  Augmentin 12/27 -->  MAGICK-Deannie Resetar, MD  [facility] Pager [phone]  If 7PM-7AM, please contact night-coverage [email or web address] Password [facility] 06/11/2014, 3:51 PM   LOS: 11 days   HPI/Subjective: No events overnight.   Objective: Filed Vitals:   06/10/14 2126 06/11/14 0533 06/11/14 1055 06/11/14 1459  BP: 127/43 105/56 117/55 113/46  Pulse: 85 86 98 94  Temp: 97.9 F (36.6 C) 98 F (36.7 C) 99.1 F (37.3 C) 99.2 F (37.3 C)  TempSrc: Oral Oral Oral Axillary  Resp: _0 Height:      Weight:      SpO2: 92% 95% 96% 95%    Intake/Output Summary (Last 24 hours) at 06/11/14 1551 Last data filed at 06/10/14 2119  Gross per 24 hour  Intake    360 ml  Output    250 ml  Net    110 ml    Exam:   General:  Pt is alert, follows commands appropriately, not in acute distress  Cardiovascular: Regular rate and rhythm, S1/S2, no murmurs, no rubs, no gallops  Respiratory: Clear to auscultation bilaterally, no wheezing, no crackles, no rhonchi  Abdomen: Soft, non tender, non distended, bowel sounds present, no guarding  Extremities: +1 bilateral LE  edema, RUE edema, pulses DP and PT palpable bilaterally  Neuro: Grossly nonfocal  Data Reviewed: Basic Metabolic Panel:  Recent Labs Lab 06/06/14 0535 06/07/14 0445 06/09/14 0422 06/10/14 0510 06/11/14 0525  NA 131* 132* 130* 131* 130*  K 4.3 4.5 4.3 4.2 4.0  CL 100 98 97 98 98  CO2 _0 GLUCOSE 146* 216* 347* 331* 241*  BUN 25* 25* 36* 45* 51*  CREATININE 1.09 1.11 1.37* 1.94* 1.91*  CALCIUM 8.0* 7.8* 7.8* 7.9* 7.6*   Liver Function Tests:  Recent Labs Lab 06/07/14 0445 06/09/14 0422 06/10/14 0510 06/11/14 0525  AST 182* 157* 76* 33  ALT 116* 137* 100* 59*  ALKPHOS 302* 413* 397* 293*  BILITOT 1.7* 4.7* 2.0* 1.2  PROT 5.9* 5.6* 5.6* 5.6*  ALBUMIN 1.5* 1.5* 1.4* 1.6*   CBC:  Recent Labs Lab 06/06/14 0535 06/07/14 0445 06/09/14 0422 06/10/14 0510 06/11/14 0525  WBC 24.2*  23.4* 18.4* 24.4* 29.0*  HGB 8.1* 8.1* 8.0* 8.4* 8.4*  HCT 25.7* 25.1* 25.2* 26.6* 25.8*  MCV 91.8 90.3 91.3 90.2 89.3  PLT 313 308 294 328 323   CBG:  Recent Labs Lab 06/10/14 1119 06/10/14 1649 06/10/14 2118 06/11/14 0741 06/11/14 1151  GLUCAP 263* 314* 351* 224* 185*   Scheduled Meds: . sodium chloride   Intravenous Q72H  . amoxicillin-clavulanate  1 tablet Oral Q12H  . doxazosin  2 mg Oral QAC breakfast  . feeding supplement (GLUCERNA SHAKE)  237 mL Oral BID BM  . insulin aspart  0-15 Units Subcutaneous TID WC  . insulin aspart  0-5 Units Subcutaneous QHS  . insulin aspart protamine- aspart  55 Units Subcutaneous BID WC  . nystatin  5 mL Oral QID  . pantoprazole  40 mg Oral Daily  . sodium chloride  10-40 mL Intracatheter Q12H   Continuous Infusions:

## 2014-06-11 NOTE — Progress Notes (Signed)
Clinical Social Work  Patient was discussed during progression meeting and MD reports she is concerned about patient returning home. PT had been recommending HH but unable to work with patient recently but RN reports that patient is weak and requires +2 assist.   CSW met with patient and wife at bedside. CSW introduced myself and explained role. Patient and wife have been married for 26 years. Patient had 3 children and they have 2 children together. 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 Wife reports they had planned on patient returning home but she will have to return to work shortly and is worried about patient staying home alone. Patient went to [facility] about 2 years ago after a knee replacement so wife reports she is aware of plans. CSW provided SNF list and explained process. CSW explained insurance coverage for SNF placement as well. Wife agreeable to [facility] search but reports she wants to talk with MD before making any final decisions.  Wife tearful during assessment and reports patient has "had a rough day." Wife reports patient has been in and out of the hospital and she just wants him to feel better. Wife reports that family is supportive but busy as well. Wife thanked CSW for time to talk about her family and agreeable to call CSW if she desires further support.  CSW completed FL2 and faxed out. CSW will follow up with bed offers.  [place], [place] [phone]

## 2014-06-11 NOTE — Progress Notes (Signed)
[facility] for [facility]    Subjective:  Feels better today   Antibiotics:  Anti-infectives    Start     Dose/Rate Route Frequency Ordered Stop   06/10/14 1000  amoxicillin-clavulanate (AUGMENTIN) 875-125 MG per tablet 1 tablet     1 tablet Oral Every 12 hours 06/09/14 2030     06/07/14 1200  metroNIDAZOLE (FLAGYL) IVPB 500 mg  Status:  Discontinued     500 mg100 mL/hr over 60 Minutes Intravenous Every 8 hours 06/07/14 1048 06/09/14 2030   06/07/14 1100  cefTRIAXone (ROCEPHIN) 2 g in dextrose 5 % 50 mL IVPB - Premix  Status:  Discontinued     2 g100 mL/hr over 30 Minutes Intravenous Every 24 hours 06/07/14 1048 06/09/14 2030   06/05/14 1200  fluconazole (DIFLUCAN) tablet 100 mg  Status:  Discontinued     100 mg Oral Daily 06/05/14 0933 06/07/14 1046   06/04/14 2000  ciprofloxacin (CIPRO) tablet 500 mg  Status:  Discontinued     500 mg Oral 2 times daily 06/04/14 1649 06/07/14 0914   06/02/14 1015  fluconazole (DIFLUCAN) IVPB 100 mg  Status:  Discontinued     100 mg50 mL/hr over 60 Minutes Intravenous Every 24 hours 06/02/14 1011 06/02/14 1024   06/01/14 2000  vancomycin (VANCOCIN) 1,750 mg in sodium chloride 0.9 % 500 mL IVPB  Status:  Discontinued     1,750 mg250 mL/hr over 120 Minutes Intravenous Every 24 hours 05/31/14 1634 06/01/14 1249   06/01/14 2000  vancomycin (VANCOCIN) IVPB 1000 mg/200 mL premix  Status:  Discontinued     1,000 mg200 mL/hr over 60 Minutes Intravenous Every 12 hours 06/01/14 1249 06/04/14 1649   06/01/14 1600  fluconazole (DIFLUCAN) IVPB 100 mg  Status:  Discontinued     100 mg50 mL/hr over 60 Minutes Intravenous Every 24 hours 06/01/14 1426 06/05/14 0933   05/31/14 2000  vancomycin (VANCOCIN) 2,500 mg in sodium chloride 0.9 % 500 mL IVPB     2,500 mg250 mL/hr over 120 Minutes Intravenous NOW 05/31/14 1730 05/31/14 2345   05/31/14 1800  vancomycin (VANCOCIN) 50 mg/mL oral solution 125 mg  Status:  Discontinued     125 mg Oral 4 times daily  05/31/14 1610 06/02/14 1148   05/31/14 1700  aztreonam (AZACTAM) 1 g in dextrose 5 % 50 mL IVPB  Status:  Discontinued     1 g100 mL/hr over 30 Minutes Intravenous 3 times per day 05/31/14 1654 06/04/14 1649   05/31/14 1645  vancomycin (VANCOCIN) 2,500 mg in sodium chloride 0.9 % 500 mL IVPB  Status:  Discontinued     2,500 mg250 mL/hr over 120 Minutes Intravenous NOW 05/31/14 1633 05/31/14 1731      Medications: Scheduled Meds: . sodium chloride   Intravenous Q72H  . amoxicillin-clavulanate  1 tablet Oral Q12H  . doxazosin  2 mg Oral QAC breakfast  . feeding supplement (GLUCERNA SHAKE)  237 mL Oral BID BM  . insulin aspart  0-15 Units Subcutaneous TID WC  . insulin aspart  0-5 Units Subcutaneous QHS  . insulin aspart protamine- aspart  55 Units Subcutaneous BID WC  . nystatin  5 mL Oral QID  . pantoprazole  40 mg Oral Daily  . sodium chloride  10-40 mL Intracatheter Q12H   Continuous Infusions:  PRN Meds:.acetaminophen, ALPRAZolam, fentaNYL, magic mouthwash w/lidocaine, magic mouthwash, morphine injection, ondansetron **OR** ondansetron (ZOFRAN) IV, sodium chloride, sodium chloride    Objective: Weight change:   Intake/Output Summary (Last  24 hours) at 06/11/14 1637 Last data filed at 06/10/14 2119  Gross per 24 hour  Intake    360 ml  Output    250 ml  Net    110 ml   Blood pressure 113/46, pulse 94, temperature 99.2 F (37.3 C), temperature source Axillary, resp. rate 24, height 6\' 6"  (1.981 m), weight 295 lb (133.811 kg), SpO2 95 %. Temp:  [97.9 F (36.6 C)-99.2 F (37.3 C)] 99.2 F (37.3 C) (12/28 1459) Pulse Rate:  [85-98] 94 (12/28 1459) Resp:  [20-24] 24 (12/28 1459) BP: (105-127)/(43-56) 113/46 mmHg (12/28 1459) SpO2:  [92 %-96 %] 95 % (12/28 1459)  Physical Exam: General: Alert and awake, oriented x3,  GI: abdomen with diffuse tenderness, greater in RUQ Neuro: nonfocal  CBC:  Recent Labs Lab 06/06/14 0535 06/07/14 0445 06/09/14 0422 06/10/14 0510  06/11/14 0525  HGB 8.1* 8.1* 8.0* 8.4* 8.4*  HCT 25.7* 25.1* 25.2* 26.6* 25.8*  PLT 313 308 294 328 323  INR  --   --  1.28  --   --      BMET  Recent Labs  06/10/14 0510 06/11/14 0525  NA 131* 130*  K 4.2 4.0  CL 98 98  CO2 26 24  GLUCOSE 331* 241*  BUN 45* 51*  CREATININE 1.94* 1.91*  CALCIUM 7.9* 7.6*     Liver Panel   Recent Labs  06/10/14 0510 06/11/14 0525  PROT 5.6* 5.6*  ALBUMIN 1.4* 1.6*  AST 76* 33  ALT 100* 59*  ALKPHOS 397* 293*  BILITOT 2.0* 1.2       Sedimentation Rate No results for input(s): ESRSEDRATE in the last 72 hours. C-Reactive Protein No results for input(s): CRP in the last 72 hours.  Micro Results: Recent Results (from the past 720 hour(s))  Culture, blood (routine x 2)     Status: None   Collection Time: 05/16/14  3:25 PM  Result Value Ref Range Status   Specimen Description BLOOD RIGHT HAND  Final   Special Requests BOTTLES DRAWN AEROBIC AND ANAEROBIC 10CCS  Final   Culture  Setup Time   Final    05/16/2014 21:17 Performed at [facility]    Culture   Final    NO GROWTH 5 DAYS Performed at [facility]    Report Status 05/22/2014 FINAL  Final  Culture, blood (routine x 2)     Status: None   Collection Time: 05/16/14  3:40 PM  Result Value Ref Range Status   Specimen Description BLOOD RIGHT FOREARM  Final   Special Requests BOTTLES DRAWN AEROBIC AND ANAEROBIC 10CC  Final   Culture  Setup Time   Final    05/16/2014 21:17 Performed at [place]   Final    NO GROWTH 5 DAYS Performed at [facility]    Report Status 05/22/2014 FINAL  Final  Clostridium Difficile by PCR     Status: Abnormal   Collection Time: 05/17/14  5:41 PM  Result Value Ref Range Status   C difficile by pcr POSITIVE (A) NEGATIVE Final    Comment: CRITICAL RESULT CALLED TO, READ BACK BY AND VERIFIED WITH: S. HOESLER 11:00 05/18/14 ([email or web address]) S.HOESLER RN    MRSA PCR Screening     Status: None    Collection Time: 05/31/14  4:25 PM  Result Value Ref Range Status   MRSA by PCR NEGATIVE NEGATIVE Final    Comment:        The GeneXpert  MRSA Assay (FDA approved for NASAL specimens only), is one component of a comprehensive MRSA colonization surveillance program. It is not intended to diagnose MRSA infection nor to guide or monitor treatment for MRSA infections.   Urine culture     Status: None   Collection Time: 05/31/14  9:15 PM  Result Value Ref Range Status   Specimen Description URINE, RANDOM  Final   Special Requests NONE  Final   Culture  Setup Time   Final    06/01/2014 01:24 Performed at [place]   Final    4,000 COLONIES/ML Performed at [facility]    Culture   Final    INSIGNIFICANT GROWTH Performed at [facility]    Report Status 06/01/2014 FINAL  Final  Culture, blood (routine x 2)     Status: None   Collection Time: 05/31/14 10:59 PM  Result Value Ref Range Status   Specimen Description BLOOD PICC  Final   Special Requests BOTTLES DRAWN AEROBIC ONLY 3ML  Final   Culture  Setup Time   Final    06/01/2014 04:41 Performed at [place]   Final    NO GROWTH 5 DAYS Performed at [facility]    Report Status 06/07/2014 FINAL  Final  Clostridium Difficile by PCR     Status: None   Collection Time: 06/01/14  6:20 AM  Result Value Ref Range Status   C difficile by pcr NEGATIVE NEGATIVE Final    Comment: Performed at [facility]    Studies/Results: No results found.    Assessment/Plan:  Active Problems:   Sepsis   CA head of pancreas   Obstruction of bile duct   Bile duct obstruction   Pancreatic cancer   Cholangitis    [name] is a 73 y.o. 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 Deatra Ina. HE has lost IV access yesterday and changed him to po  augmentin  Cholangitis:  --changed  to oral augmentin 875/125 bid  Plan to complete total of 10 days  Leukocytosis: clinically NOT from CDI. If continues to worsen would consider repeat CT abdomen. I suspect that much of this WBC (and he has  For several months had chronically elevated WBC ) is in reaction to his pancreatic cancer  Hx of Clotridium Difficile:   --I WOULD NOT CHECK C DIFF PCR if he does have have more than 5 BM a day unless we have evidence for ileus which I do not see. He certainly is colonized with C diff  And if we check C diff PCR on normal stool we may mistakenly diagnose infection which clinically he does not yet have. I agree his WBC is certainly high as can be seen with CDiff but I think he has mx other things to explain this leukocytosis  I will sign off for now. Please call us back with further questions or concerns.  Dr. Baxter Flattery is on for our group 29th thru the 1st and Dr. Johnnye Sima 2nd and 3rd   LOS: 11 days   Alcide Evener 06/11/2014, 4:37 PM

## 2014-06-11 NOTE — Progress Notes (Signed)
Physical Therapy Treatment Patient Details Name: [name] MRN: [id number] DOB: 05-18-1941 Today's Date: 18-Jun-2014    History of Present Illness Pt is 73 yo  male adm 05/31/14 with sepsis of unclear etiology; PMHx:  HTN, HLD, DM, pancreatic cancer    PT Comments    Pt with very limited effort,  Pt repeatedly states he doesn't want to do anything; pt is much weaker than on initial PT eval; will follow;  Follow Up Recommendations  Supervision/Assistance - 24 hour;SNF     Equipment Recommendations  None recommended by PT    Recommendations for Other Services       Precautions / Restrictions Precautions Precautions: Fall Precaution Comments: ABD surgery    Mobility  Bed Mobility Overal bed mobility: Needs Assistance Bed Mobility: Supine to Sit;Sit to Supine;Rolling Rolling: +2 for physical assistance;Max assist   Supine to sit: +2 for physical assistance;Max assist;Total assist Sit to supine: +2 for physical assistance;Total assist   General bed mobility comments: pt requriring +2 for LEs, trunk; pt not self assisting  Transfers                    Ambulation/Gait                 Stairs            Wheelchair Mobility    Modified Rankin (Stroke Patients Only)       Balance Overall balance assessment: Needs assistance Sitting-balance support: Feet supported;No upper extremity supported Sitting balance-Leahy Scale: Zero Sitting balance - Comments: pt with multi-directional LOB, he is with limited effort to sit up, pt repeatedly tries to push himself  back down into the bed                            Cognition Arousal/Alertness: Lethargic Behavior During Therapy: Flat affect Overall Cognitive Status: Within Functional Limits for tasks assessed                      Exercises      General Comments        Pertinent Vitals/Pain Pain Assessment: Faces Faces Pain Scale: Hurts even more Pain Location: c/o diffuse  pain Pain Intervention(s): Monitored during session;Repositioned    Home Living                      Prior Function            PT Goals (current goals can now be found in the care plan section) Acute Rehab PT Goals Patient Stated Goal: go home, feel better PT Goal Formulation: With patient/family Time For Goal Achievement: 06/14/14 Potential to Achieve Goals: Poor Progress towards PT goals: Not progressing toward goals - comment;Goals downgraded-see care plan    Frequency  Min 3X/week    PT Plan Current plan remains appropriate    Co-evaluation             End of Session   Activity Tolerance: Patient limited by fatigue;Other (comment) (pt is self limiting) Patient left: in bed;with call bell/phone within reach;with family/visitor present     Time: 9242-6834 PT Time Calculation (min) (ACUTE ONLY): 23 min  Charges:  $Therapeutic Activity: 23-37 mins                    G Codes:      [name] Jun 18, 2014, 4:55 PM

## 2014-06-11 NOTE — Progress Notes (Signed)
Placed patient on home CPAP set at [place]

## 2014-06-11 NOTE — Progress Notes (Addendum)
Clinical Social Work Department CLINICAL SOCIAL WORK PLACEMENT NOTE 06/11/2014  Patient:  [name], [name]  Account Number:  [id number] Admit date:  05/31/2014  Clinical Social Worker:  Sindy Messing, LCSW  Date/time:  06/11/2014 03:30 PM  Clinical Social Work is seeking post-discharge placement for this patient at the following level of care:   SKILLED NURSING   (*CSW will update this form in Epic as items are completed)   06/11/2014  Patient/family provided with [facility] Department of Clinical Social Work's list of facilities offering this level of care within the geographic area requested by the patient (or if unable, by the patient's family).  06/11/2014  Patient/family informed of their freedom to choose among providers that offer the needed level of care, that participate in Medicare, Medicaid or managed care program needed by the patient, have an available bed and are willing to accept the patient.  06/11/2014  Patient/family informed of [facility]' ownership interest in [facility], as well as of the fact that they are under no obligation to receive care at this facility.  PASARR submitted to EDS on existing # PASARR number received on   FL2 transmitted to all facilities in geographic area requested by pt/family on  06/11/2014 FL2 transmitted to all facilities within larger geographic area on   Patient informed that his/her managed care company has contracts with or will negotiate with  certain facilities, including the following:     Patient/family informed of bed offers received:  06/13/14 Patient chooses bed at [facility] Physician recommends and patient chooses bed at    Patient to be transferred to [facility]  on  06/14/14 Patient to be transferred to facility by [facility] Patient and family notified of transfer on 06/14/14 Name of family member notified:  Wife at bedside  The following physician request were entered in Epic:   Additional Comments:

## 2014-06-11 NOTE — Progress Notes (Signed)
IP PROGRESS NOTE  Subjective:   Events noted over the last few days. No major complaint this morning. He still has some abdominal pain with activity. His right upper extremity swelling has improved. He is now ambulating at this time continues to be bedridden.  Objective:  Vital signs in last 24 hours: Temp:  [97.9 F (36.6 C)-98.7 F (37.1 C)] 98 F (36.7 C) (12/28 0533) Pulse Rate:  [84-86] 86 (12/28 0533) Resp:  [20-22] 20 (12/28 0533) BP: (105-127)/(43-56) 105/56 mmHg (12/28 0533) SpO2:  [92 %-95 %] 95 % (12/28 0533) Weight change:  Last BM Date: 06/06/14  Intake/Output from previous day: 12/27 0701 - 12/28 0700 In: 360 [P.O.:360] Out: 250 [Urine:250] Alert elderly gentleman slightly lethargic not in any distress. Mouth: mucous membranes moist, pharynx normal without lesions Resp: clear to auscultation bilaterally Cardio: regular rate and rhythm, S1, S2 normal, no murmur, click, rub or gallop GI: soft, non-tender; bowel sounds normal; no masses,  no organomegaly Extremities: Increased right upper extremity edema seems to be improved.   Lab Results:  Recent Labs  06/10/14 0510 06/11/14 0525  WBC 24.4* 29.0*  HGB 8.4* 8.4*  HCT 26.6* 25.8*  PLT 328 323    BMET  Recent Labs  06/10/14 0510 06/11/14 0525  NA 131* 130*  K 4.2 4.0  CL 98 98  CO2 26 24  GLUCOSE 331* 241*  BUN 45* 51*  CREATININE 1.94* 1.91*  CALCIUM 7.9* 7.6*    Medications: I have reviewed the patient's current medications.  Assessment/Plan:  73 year old gentleman with the following issues:  1. Fever and leukocytosis: He is afebrile at this time his white cell count continue to be elevated which is likely reactive in nature. His sepsis could be related to cholangitis and he is on the appropriate antibiotics.  2. Locally advanced pancreatic cancer: He is scheduled to start therapy next week and it is reasonable to think he is on track to do so. I do not think is related to proceed with  combination of radiation and chemotherapy. I will evaluate him early next week for possibility of starting chemotherapy. At this time I'll like to defer that to at least next week.  3. IV access: This might be an issue down the line. Given his potential infection we would like to avoid long-term catheters but he has very poor IV access. We'll attempt to utilize peripheral veins to its safety inserts more permanent device.  4. Depression: I agree with using antidepressant and Celexa might be a reasonable option.     LOS: 11 days   RJJOAC,ZYSAY 06/11/2014, 8:00 AM

## 2014-06-12 ENCOUNTER — Ambulatory Visit
Admit: 2014-06-12 | Discharge: 2014-06-12 | Disposition: A | Discharge status: Home / Self Care | Payer: Medicare Other | Source: Ambulatory Visit | Attending: Radiation Oncology | Admitting: Radiation Oncology

## 2014-06-12 ENCOUNTER — Ambulatory Visit
Admission: RE | Admit: 2014-06-12 | Discharge: 2014-06-12 | Disposition: A | Discharge status: Home / Self Care | Payer: Medicare Other | Source: Ambulatory Visit | Attending: Radiation Oncology | Admitting: Radiation Oncology

## 2014-06-12 ENCOUNTER — Encounter (HOSPITAL_COMMUNITY): Payer: Self-pay | Admitting: Gastroenterology

## 2014-06-12 DIAGNOSIS — C250 Malignant neoplasm of head of pancreas: Secondary | ICD-10-CM

## 2014-06-12 LAB — COMPREHENSIVE METABOLIC PANEL
ALT: 37 U/L (ref 0–53)
AST: 23 U/L (ref 0–37)
Albumin: 1.4 g/dL — ABNORMAL LOW (ref 3.5–5.2)
Alkaline Phosphatase: 204 U/L — ABNORMAL HIGH (ref 39–117)
Anion gap: 8 (ref 5–15)
BUN: 49 mg/dL — ABNORMAL HIGH (ref 6–23)
CALCIUM: 8 mg/dL — AB (ref 8.4–10.5)
CO2: 26 mmol/L (ref 19–32)
CREATININE: 1.71 mg/dL — AB (ref 0.50–1.35)
Chloride: 99 mEq/L (ref 96–112)
GFR calc Af Amer: 44 mL/min — ABNORMAL LOW (ref >90)
GFR, EST NON AFRICAN AMERICAN: 38 mL/min — AB (ref >90)
Glucose, Bld: 163 mg/dL — ABNORMAL HIGH (ref 70–99)
Potassium: 4 mmol/L (ref 3.5–5.1)
SODIUM: 133 mmol/L — AB (ref 135–145)
TOTAL PROTEIN: 5.5 g/dL — AB (ref 6.0–8.3)
Total Bilirubin: 1.2 mg/dL (ref 0.3–1.2)

## 2014-06-12 LAB — GLUCOSE, CAPILLARY
GLUCOSE-CAPILLARY: 156 mg/dL — AB (ref 70–99)
GLUCOSE-CAPILLARY: 250 mg/dL — AB (ref 70–99)
GLUCOSE-CAPILLARY: 240 mg/dL — AB (ref 70–99)
Glucose-Capillary: 167 mg/dL — ABNORMAL HIGH (ref 70–99)
Glucose-Capillary: 196 mg/dL — ABNORMAL HIGH (ref 70–99)

## 2014-06-12 LAB — CBC
HCT: 24.2 % — ABNORMAL LOW (ref 39.0–52.0)
Hemoglobin: 7.8 g/dL — ABNORMAL LOW (ref 13.0–17.0)
MCH: 29.4 pg (ref 26.0–34.0)
MCHC: 32.2 g/dL (ref 30.0–36.0)
MCV: 91.3 fL (ref 78.0–100.0)
PLATELETS: 311 10*3/uL (ref 150–400)
RBC: 2.65 MIL/uL — ABNORMAL LOW (ref 4.22–5.81)
RDW: 16.8 % — ABNORMAL HIGH (ref 11.5–15.5)
WBC: 26.6 10*3/uL — ABNORMAL HIGH (ref 4.0–10.5)

## 2014-06-12 MED ORDER — RADIAPLEXRX EX GEL
Freq: Once | CUTANEOUS | Status: AC
  Administered 2014-06-12: 10:00:00 via TOPICAL

## 2014-06-12 NOTE — Progress Notes (Signed)
Pt's wife has already placed pt on home CPAP and pt is resting comfortably. Wife was encouraged to contact RT throughout the night should she need further assistance.

## 2014-06-12 NOTE — Progress Notes (Signed)
Clinical Social Work  CSW met with patient and wife at bedside. Patient sleeping but CSW spoke with wife. Wife reports patient had a better today and she feels like she is getting better. Wife reports she is still considering options but wants patient to come home if at all possible. CSW spoke with wife about SNF list and encouraged wife to have SNF in option in case she is unable to manage patient at home at DC. Wife reports she wants to talk with MD about specific DC plans to determine if [facility] vs SNF is needed. Wife plans on speaking with family tonight to make decision about family support if patient DC home.   CSW will continue to follow.  [place], [place] [phone]

## 2014-06-12 NOTE — Progress Notes (Signed)
Patient education done with [place] Ina son-in-law,he is a paramedic,patient too drowsy, wife  Stayed in patient's room, radiation therapy and you book ,radiaplex gel given to [place], went over fatigue,skin irritating, nausea,vomiting,, weight loss, possible need for IVF'S  increase protein in diet, may need to go to smaller meals 5-6 instead of 6 large meals, stay hydrated,drink plenty water, MD to see patient wekly and prn,gave my busines card also to son in law [place] and patient's schedule, can speak also with wife any time, can call me for any concerns/questions 10:16 AM  .

## 2014-06-12 NOTE — Progress Notes (Signed)
Patient ID: [name], male   DOB: 03/03/1941, 73 y.o.   MRN: [id number]  [facility] PROGRESS NOTE  [name] RSW:[id number] DOB: 07-16-1940 DOA: 05/31/2014 PCP: [name], MD   Brief narrative:   Pt is 73 yo very pleasant male with HTN, HLD, DM type II (on insulin and Metformin), pancreatic cancer (follows with Dr. Alen Blew), recently hospitalized for C. Diff and still on oral Vancomycin, now presented to [facility] ED with main concern of several days duration of progressively worsening abdominal pain, intermittent in nature and throbbing, worse on the left side and occasionally but not consistently radiating to the right side, 7/10 in severity when present, with no specific alleviating or aggravating factors. This has been associated with nausea and poor oral intake, several lbs weight loss, fatigue, dyspnea on exertion. He denies any diarrhea or constipation, no blood in stool, no specific urinary concerns, no blood in urine, no fevers, chills,no chest pain.   In ED, pt noted to be in mild distress due to pain. VS notable for BP 85/43 mmHg, HR 113 bpm, RR 22 bpm, T 99.68F. Blood work notable for WBC > 30K, Hg 7.9 (repeat Hg 8.5, prior to transfusion). CT abd with ? Duodenitis. [facility] asked to admit for further evaluation and management of sepsis of unclear etiology. SDU requested. 2 U PRBC requested.   Assessment and Plan: Active Problems: Sepsis - criteria met on admission with tachycardia, leukocytosis, fever, RR 22, hypotension  - appears that this was related to cholangitis, pt now status post biliary stent placement, post op day #3 - now on oral ABX Augmentin (started 12/27) - continued broad spectrum ABX Vanc and Aztreonam for 5 days Ascending cholangitis with CBD stricture - per GI, unable to do ERCP due to tumor invading second portion of the duodenum - status post biliary stent placement post op day #3, normalization of bili - LFT's overall trending down  - CMET  in AM Diastolic CHF, acute on chronic  - with significant lower and upper extremity pitting edema - in the setting of hypoalbuminemia, not cardiac issue  - weight on admission 306 lbs --> up to 311 lbs --> 303 lbs --> 295 lbs this AM - placed on Lasix 40 mg IV QD 12/18 and increased frequency to BID on 12/20 to improve diuresis  - will hold lasix for now since 12/27 as Cr is still up but is overall trending down, may resume in AM  - continue to monitor weight, I's and O's Acute on chronic blood loss anemia, of pancreatic cancer, DM - review of records indicate that Hg is ~9 at baseline - pt denies any blood in stool or urine, FOBT is negative  - cancer is possible source of bleed - repeat Hg 12/17 was as low as 6.3, pt has received 2 U PRBC 12/17 and with appropriate increase in Hg 8.6 - Hg now remains stable with no signs of active bleeding  - IR consulted for port-a-cath placement but this will be on hold until infection resolves Leukocytosis - secondary to cholangitis  - oral Vanc for C. Diff discontinued (pt started on Dec 4th, 2015), completed Dec 19th, 2015 - changed ABX to Augment per ID 12/27 Acute renal failure - hold Lasix for now as noted above  - pt less swelling on exam this AM - Cr is trending down  - repeat BMP in AM DM, type II, with complications of anemia and CKD II - A1C 8.8  - continue low dose  Lantus for now until oral intake improves - hold Metformin and place on SSI for now  HTN - holding home medical regimen Metoprolol and Lisinopril - continuing Lasix in an attempt to improve diuresis  RUE edema > LUE, LE edema bilateral  - doppler scan negative for DVT  - edema improving  - holding lasix as noted above  HLD - on statin  CKD stage II - Cr trending down Pancreatic cancer - appreciate Dr. 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  Family Communication: plan of care discussed with the patient Disposition Plan: Remains inpatient   IV access:   Peripheral IV Procedures and diagnostic studies:    Ct Abdomen Pelvis W Contrast 05/31/2014 Stable 6 cm mass involving the pancreatic head and duodenum, consistent with known pancreatic carcinoma. No evidence of obstruction. Stable mild peripancreatic lymphadenopathy, consistent metastatic disease. Increased hazy opacity surrounding the pancreatic head and duodenum and small amount of fluid within Morison's pouch. This may be related to pancreatitis or duodenitis. Bilateral nonobstructive nephrolithiasis and diverticulosis incidentally noted.   Nm Pet Image Initial (pi) Skull Base To Thigh 05/29/2014 1. Mass along the C-loop of the duodenum adjacent to pancreatic head is intensely hypermetabolic consistent with carcinoma. 2. Adjacent small peripancreatic hypermetabolic lymph node. 3. No additional hypermetabolic adenopathy or metastatic disease.   Dg Abd Acute W/ches 05/31/2014 Unremarkable bowel gas pattern. No obstruction or free air. Left base atelectasis. No lung edema or consolidation.   US Abdomen Limited 06/07/2014 Dilatation of the proximal common bowel duct, with mild central intrahepatic biliary ductal dilatation. This appears grossly stable compared to prior CT. Postcholecystectomy. 2. Heterogeneous hepatic parenchyma without focal lesion. 3. The small amount of ascites on prior CT is not seen. Medical Consultants:   Oncology Dr. Alen Blew   IR for port-a-cath placement  Cardiology for management of anasarca   GI  ID Other Consultants:   None  IAnti-Infectives:    Vancomycin 12/17 --> 12/21  Aztreonam 12/17 --> 12/21  Cipro 12/21  --> 12/24  Rocephin 12/24 --> 12/26  Flagyl 12/24 --> 12/26  Augmentin 12/27 -->   MAGICK-Quinnton Bury, MD  [facility] Pager [phone]  If 7PM-7AM, please contact night-coverage [email or web address] Password [email or web address] 06/12/2014, 10:24 AM   LOS: 12 days   HPI/Subjective: No events overnight.   Objective: Filed Vitals:   06/11/14 1055 06/11/14 1459 06/11/14 2120 06/12/14 0516  BP: 117/55 113/46 110/56 131/58  Pulse: 98 94 81 83  Temp: 99.1 F (37.3 C) 99.2 F (37.3 C) 98 F (36.7 C) 98.2 F (36.8 C)  TempSrc: Oral Axillary Axillary Axillary  Resp: $Remo'20 24 22 20  'lMJrG$ Height:      Weight:      SpO2: 96% 95% 98% 97%    Intake/Output Summary (Last 24 hours) at 06/12/14 1024 Last data filed at 06/12/14 2197  Gross per 24 hour  Intake    480 ml  Output    700 ml  Net   -220 ml    Exam:   General:  Pt is alert, follows commands appropriately, not in acute distress  Cardiovascular: Regular rate and rhythm,  no rubs, no gallops  Respiratory: Clear to auscultation bilaterally, no wheezing, diminished breath sounds at bases   Abdomen:  Soft, non tender, non distended, bowel sounds present, no guarding  Extremities: +1 bilateral LE pitting edema, RUE > LUE edema,  pulses DP and PT palpable bilaterally  Neuro: Grossly nonfocal  Data Reviewed: Basic Metabolic Panel:  Recent Labs Lab 06/07/14 0445 06/09/14 0422 06/10/14 0510 06/11/14 0525 06/12/14 0506  NA 132* 130* 131* 130* 133*  K 4.5 4.3 4.2 4.0 4.0  CL 98 97 98 98 99  CO2 $Re'26 24 26 24 26  'Pko$ GLUCOSE 216* 347* 331* 241* 163*  BUN 25* 36* 45* 51* 49*  CREATININE 1.11 1.37* 1.94* 1.91* 1.71*  CALCIUM 7.8* 7.8* 7.9* 7.6* 8.0*   Liver Function Tests:  Recent Labs Lab 06/07/14 0445 06/09/14 0422 06/10/14 0510 06/11/14 0525 06/12/14 0506  AST 182* 157* 76* 33 23  ALT 116* 137* 100* 59* 37  ALKPHOS 302* 413* 397* 293* 204*  BILITOT 1.7* 4.7* 2.0* 1.2 1.2  PROT 5.9* 5.6* 5.6* 5.6* 5.5*  ALBUMIN 1.5* 1.5* 1.4* 1.6* 1.4*    CBC:  Recent Labs Lab 06/07/14 0445 06/09/14 0422 06/10/14 0510 06/11/14 0525 06/12/14 0506  WBC 23.4* 18.4* 24.4* 29.0* 26.6*  HGB 8.1* 8.0* 8.4* 8.4* 7.8*  HCT 25.1* 25.2* 26.6* 25.8* 24.2*  MCV 90.3 91.3 90.2 89.3 91.3  PLT 308 294 328 323 311   CBG:  Recent Labs Lab 06/11/14 0741 06/11/14 1151 06/11/14 1709 06/11/14 2226 06/12/14 0730  GLUCAP 224* 185* 193* 156* 167*    Scheduled Meds: . sodium chloride   Intravenous Q72H  . amoxicillin-clavulanate  1 tablet Oral Q12H  . doxazosin  2 mg Oral QAC breakfast  . feeding supplement (GLUCERNA SHAKE)  237 mL Oral BID BM  . insulin aspart  0-15 Units Subcutaneous TID WC  . insulin aspart  0-5 Units Subcutaneous QHS  . insulin aspart protamine- aspart  55 Units Subcutaneous BID WC  . nystatin  5 mL Oral QID  . pantoprazole  40 mg Oral Daily  . sodium chloride  10-40 mL Intracatheter Q12H   Continuous Infusions:

## 2014-06-13 ENCOUNTER — Telehealth: Payer: Self-pay | Admitting: *Deleted

## 2014-06-13 ENCOUNTER — Ambulatory Visit
Admit: 2014-06-13 | Discharge: 2014-06-13 | Disposition: A | Discharge status: Home / Self Care | Payer: Medicare Other | Attending: Radiation Oncology | Admitting: Radiation Oncology

## 2014-06-13 ENCOUNTER — Encounter: Payer: Self-pay | Admitting: Radiation Oncology

## 2014-06-13 ENCOUNTER — Ambulatory Visit
Admission: RE | Admit: 2014-06-13 | Discharge: 2014-06-13 | Disposition: A | Discharge status: Home / Self Care | Payer: Medicare Other | Source: Ambulatory Visit | Attending: Radiation Oncology | Admitting: Radiation Oncology

## 2014-06-13 VITALS — BP 129/52 | HR 86 | Temp 97.4°F | Resp 20

## 2014-06-13 DIAGNOSIS — C250 Malignant neoplasm of head of pancreas: Secondary | ICD-10-CM

## 2014-06-13 LAB — CBC
HEMATOCRIT: 28.4 % — AB (ref 39.0–52.0)
HEMOGLOBIN: 9 g/dL — AB (ref 13.0–17.0)
MCH: 28.8 pg (ref 26.0–34.0)
MCHC: 31.7 g/dL (ref 30.0–36.0)
MCV: 91 fL (ref 78.0–100.0)
Platelets: 267 10*3/uL (ref 150–400)
RBC: 3.12 MIL/uL — AB (ref 4.22–5.81)
RDW: 17.3 % — ABNORMAL HIGH (ref 11.5–15.5)
WBC: 27 10*3/uL — AB (ref 4.0–10.5)

## 2014-06-13 LAB — COMPREHENSIVE METABOLIC PANEL
ALBUMIN: 1.6 g/dL — AB (ref 3.5–5.2)
ALK PHOS: 188 U/L — AB (ref 39–117)
ALT: 27 U/L (ref 0–53)
ANION GAP: 11 (ref 5–15)
AST: 26 U/L (ref 0–37)
BILIRUBIN TOTAL: 1.3 mg/dL — AB (ref 0.3–1.2)
BUN: 51 mg/dL — AB (ref 6–23)
CO2: 22 mmol/L (ref 19–32)
CREATININE: 1.5 mg/dL — AB (ref 0.50–1.35)
Calcium: 8 mg/dL — ABNORMAL LOW (ref 8.4–10.5)
Chloride: 97 mEq/L (ref 96–112)
GFR calc Af Amer: 52 mL/min — ABNORMAL LOW (ref >90)
GFR calc non Af Amer: 45 mL/min — ABNORMAL LOW (ref >90)
Glucose, Bld: 179 mg/dL — ABNORMAL HIGH (ref 70–99)
Potassium: 4.3 mmol/L (ref 3.5–5.1)
Sodium: 130 mmol/L — ABNORMAL LOW (ref 135–145)
Total Protein: 6 g/dL (ref 6.0–8.3)

## 2014-06-13 LAB — GLUCOSE, CAPILLARY
GLUCOSE-CAPILLARY: 180 mg/dL — AB (ref 70–99)
GLUCOSE-CAPILLARY: 162 mg/dL — AB (ref 70–99)
Glucose-Capillary: 204 mg/dL — ABNORMAL HIGH (ref 70–99)
Glucose-Capillary: 145 mg/dL — ABNORMAL HIGH (ref 70–99)

## 2014-06-13 MED ORDER — INSULIN ASPART PROT & ASPART (70-30 MIX) 100 UNIT/ML ~~LOC~~ SUSP: 55.0000 [IU] | Freq: Two times a day (BID) | SUBCUTANEOUS | Status: DC

## 2014-06-13 MED ORDER — FUROSEMIDE 20 MG PO TABS: 20.0000 mg | ORAL_TABLET | Freq: Every morning | ORAL | Status: DC

## 2014-06-13 MED ORDER — AMOXICILLIN-POT CLAVULANATE 875-125 MG PO TABS: 1.0000 | ORAL_TABLET | Freq: Two times a day (BID) | ORAL | Status: DC

## 2014-06-13 MED ORDER — ACETAMINOPHEN 325 MG PO TABS: 650.0000 mg | ORAL_TABLET | Freq: Four times a day (QID) | ORAL | Status: DC | PRN

## 2014-06-13 MED ORDER — GLUCERNA SHAKE PO LIQD: 237.0000 mL | Freq: Two times a day (BID) | ORAL | Status: DC

## 2014-06-13 MED ORDER — NYSTATIN 100000 UNIT/ML MT SUSP: 5.0000 mL | Freq: Four times a day (QID) | OROMUCOSAL | Status: DC

## 2014-06-13 MED ORDER — BACLOFEN 5 MG HALF TABLET
5.0000 mg | ORAL_TABLET | Freq: Two times a day (BID) | ORAL | Status: DC
  Administered 2014-06-13 – 2014-06-14 (×2): 5 mg via ORAL
  Filled 2014-06-13 (×3): qty 1

## 2014-06-13 MED ORDER — POTASSIUM CHLORIDE CRYS ER 10 MEQ PO TBCR: 10.0000 meq | EXTENDED_RELEASE_TABLET | Freq: Every day | ORAL | Status: DC

## 2014-06-13 MED ORDER — ALPRAZOLAM 0.5 MG PO TABS: 0.5000 mg | ORAL_TABLET | Freq: Three times a day (TID) | ORAL | Status: DC | PRN

## 2014-06-13 MED ORDER — INSULIN ASPART 100 UNIT/ML ~~LOC~~ SOLN
0.0000 [IU] | Freq: Three times a day (TID) | SUBCUTANEOUS | Status: DC
Start: 2014-06-13 — End: 2014-06-25

## 2014-06-13 MED ORDER — MAGIC MOUTHWASH W/LIDOCAINE: 5.0000 mL | Freq: Three times a day (TID) | ORAL | Status: DC | PRN

## 2014-06-13 NOTE — Telephone Encounter (Signed)
[facility] facility at [phone] to speak with  Neoma Laming to give information about patient coming to their facility tomorrow from d/c hospital [facility], printed out scheduled calendar and my business card attached, and that patient would need ambulance transportation for his treatments,patient has had 3 recent surgeries and isn't ambulating yet', gave his name and dob , she thanked me for the heads up and will arrange transportation on treatment days Mon-Fridays, starting 06/18/2014, infomred wife that I had called and spoke with Neoma Laming in charge  11:00 AM

## 2014-06-13 NOTE — Progress Notes (Signed)
  Radiation Oncology         [phone] ________________________________  Name: [name] MRN: [id number]  Date: 06/13/2014  DOB: Oct 31, 1940  INPATIENT (plan for discharge to SNF today)  Weekly Radiation Therapy Management    ICD-9-CM ICD-10-CM   1. CA head of pancreas 157.0 C25.0     Current Dose: 5.787 Gy     Planned Dose:  54.012 Gy  Narrative . . . . . . . . The patient presents for routine under treatment assessment.                                   Weekly rad txs 3/completed, patient denies pain or nausea, still on liquid diet, see that he will be d/c to [facility] tomorrow,printed scheduled calendar with my business card  for his radiation treatments and my business card, gave this to wife and they are to transport patient  For his radiation treatments per wife,went over side effects radiation with wife, she has the book already given yesterday,                                  Set-up films were reviewed.                                 The chart was checked. Physical Findings. . .  oral temperature is 97.4 F (36.3 C). His blood pressure is 129/52 and his pulse is 86. His respiration is 20 and oxygen saturation is 95%. . Weight essentially stable.  No significant changes. Impression . . . . . . . The patient is tolerating radiation. Plan . . . . . . . . . . . . Continue treatment as planned.  ________________________________  Sheral Apley. Tammi Klippel, M.D.

## 2014-06-13 NOTE — Discharge Instructions (Signed)
Sepsis °Sepsis is a serious infection of your blood or tissues that affects your whole body. The infection that causes sepsis may be bacterial, viral, fungal, or parasitic. Sepsis may be life threatening. Sepsis can cause your blood pressure to drop. This may result in shock. Shock causes your central nervous system and your organs to stop working correctly.  °RISK FACTORS °Sepsis can happen in anyone, but it is more likely to happen in people who have weakened immune systems. °SIGNS AND SYMPTOMS  °Symptoms of sepsis can include: °· Fever or low body temperature (hypothermia). °· Rapid breathing (hyperventilation). °· Chills. °· Rapid heartbeat (tachycardia). °· Confusion or light-headedness. °· Trouble breathing. °· Urinating much less than usual. °· Cool, clammy skin or red, flushed skin. °· Other problems with the heart, kidneys, or brain. °DIAGNOSIS  °Your health care provider will likely do tests to look for an infection, to see if the infection has spread to your blood, and to see how serious your condition is. Tests can include: °· Blood tests, including cultures of your blood. °· Cultures of other fluids from your body, such as: °¨ Urine. °¨ Pus from wounds. °¨ Mucus coughed up from your lungs. °· Urine tests other than cultures. °· X-ray exams or other imaging tests. °TREATMENT  °Treatment will begin with elimination of the source of infection. If your sepsis is likely caused by a bacterial or fungal infection, you will be given antibiotic or antifungal medicines. °You may also receive: °· Oxygen. °· Fluids through an IV tube. °· Medicines to increase your blood pressure. °· A machine to clean your blood (dialysis) if your kidneys fail. °· A machine to help you breathe if your lungs fail. °SEEK IMMEDIATE MEDICAL CARE IF: °You get an infection or develop any of the signs and symptoms of sepsis after surgery or a hospitalization. °Document Released: 02/28/2003 Document Revised: 06/06/2013 Document Reviewed:  02/06/2013 °ExitCare® Patient Information ©2015 [employer]. This information is not intended to replace advice given to you by your health care provider. Make sure you discuss any questions you have with your health care provider. ° °

## 2014-06-13 NOTE — Progress Notes (Signed)
Clinical Social Work  Patient was discussed during progression meeting and MD reports patient should be ready to DC tomorrow. CSW met with patient and wife at bedside to discuss DC plans. Wife reports patient is still very weak and cannot return home. Patient and wife have reviewed bed offers and prefer placement at [facility]. CSW spoke with [facility] who confirmed they can accept patient tomorrow.   CSW will continue to follow.  [place], [place] [phone]

## 2014-06-13 NOTE — Discharge Summary (Signed)
Physician Discharge Summary  [name]:[id number] DOB: Oct 14, 1940 DOA: 05/31/2014  PCP: [name], MD  Admit date: 05/31/2014 Discharge date: 06/14/2014  Recommendations for Outpatient Follow-up:  1. Check CBC and BMP per SNF protocol. 2. Patient will continue Augmentin for 7 days on discharge. Has tolerated this medication in hospital.   Discharge Diagnoses:  Active Problems:   Sepsis   CA head of pancreas   Obstruction of bile duct   Bile duct obstruction   Pancreatic cancer   Cholangitis    Discharge Condition: stable   Diet recommendation: as tolerated   History of present illness:  Pt is 73 yo very pleasant male with HTN, HLD, DM type II (on insulin and Metformin), pancreatic cancer (follows with Dr. Alen Blew), recently hospitalized for C. Diff and still on oral Vancomycin, now presented to [facility] ED with main concern of several days duration of progressively worsening abdominal pain, intermittent in nature and throbbing, worse on the left side and occasionally but not consistently radiating to the right side, 7/10 in severity when present, with no specific alleviating or aggravating factors. This has been associated with nausea and poor oral intake, several lbs weight loss, fatigue, dyspnea on exertion. He denies any diarrhea or constipation, no blood in stool, no specific urinary concerns, no blood in urine, no fevers, chills,no chest pain.   In ED, pt noted to be in mild distress due to pain. VS notable for BP 85/43 mmHg, HR 113 bpm, RR 22 bpm, T 99.79F. Blood work notable for WBC > 30K, Hg 7.9 (repeat Hg 8.5, prior to transfusion). CT abd with ? Duodenitis. Patient admitted evaluation and management of sepsis of unclear etiology.   Assessment and Plan:  Active Problems: Sepsis secondary to ascending cholangitis with CBD stricture / leukocytosis   Sepsis criteria met on admission with tachycardia, leukocytosis, fever, RR 22, hypotension. Source of infection -  cholangitis  Patient is status post biliary stent placement  Was on quite a few abx but now on Augmentin which he will continue for 7 more days on discharge  Per GI, unable to do ERCP due to tumor invading second portion of the duodenum  LFT's overall trending down    Active Problems: Diastolic CHF, acute on chronic   With significant lower and upper extremity pitting edema  Weight trend since admission 306 lbs --> up to 311 lbs --> 303 lbs --> 295 lbs  Initially on lasix 40 mg IV QD to increase diuresis but this was stopped due to renal insufficiency.  On discharge we will prescribe low dose lasix 20 mg daily.   Continue to monitor renal function in SNF.  Acute on chronic blood loss anemia  GI bleed due to pancreatic cancer  Hemoglobin at 9 is patient's baseline  No reports of GI bleed  Since admission he has received 2 units of PRBC transfusion    review of records indicate that Hg is ~9 at baseline   IR consulted for port-a-cath placement but this will be on hold until infection resolves  Leukocytosis  Secondary to cholangitis. In regards to previous infection due to [place] patient has completed oral vanco 06/02/2014  Acute renal failure / chronic kidney disease stage 2  Secondary to sepsis, lasix  Now on low dose lasix due to LE edema  Creatinine 1.5 this am  Avoid nephrotoxins such as lisinopril, metformin. Lasix may cause renal insufficiency but due to LE edema pt will needed on discharge with regular renal function checks.  DM, type  II, with complications of anemia and CKD II  A1C 8.8   Will continue lantus and SSI on discharge  Hold metformin   HTN  May continue  Metoprolol and lasix on discharge  RUE edema > LUE, LE edema bilateral   Doppler scan negative for DVT   Edema improving   Dyslipidemia   Continue statin therapy   Pancreatic cancer  Appreciate Dr. Alen Blew input   Bilateral non obstructive nephrolithiasis  Incidental  finding   Not acute issue at this time   Moderate PCM  In the context of acute on chronic illness  Carb modified diet   Oral ulcers  Continue magic mouthwash, nystatin   SCD's for DVT prophylaxis  Code Status: Full.  Family Communication: plan of care discussed with the patient's wife at the bedside; agrees to SNF placement; unable to take of the patient at home.  IV access:   Peripheral IV  Procedures and diagnostic studies:    Ct Abdomen Pelvis W Contrast 05/31/2014 Stable 6 cm mass involving the pancreatic head and duodenum, consistent with known pancreatic carcinoma. No evidence of obstruction. Stable mild peripancreatic lymphadenopathy, consistent metastatic disease. Increased hazy opacity surrounding the pancreatic head and duodenum and small amount of fluid within Morison's pouch. This may be related to pancreatitis or duodenitis. Bilateral nonobstructive nephrolithiasis and diverticulosis incidentally noted.   Nm Pet Image Initial (pi) Skull Base To Thigh 05/29/2014 1. Mass along the C-loop of the duodenum adjacent to pancreatic head is intensely hypermetabolic consistent with carcinoma. 2. Adjacent small peripancreatic hypermetabolic lymph node. 3. No additional hypermetabolic adenopathy or metastatic disease.   Dg Abd Acute W/ches 05/31/2014 Unremarkable bowel gas pattern. No obstruction or free air. Left base atelectasis. No lung edema or consolidation.   US Abdomen Limited 06/07/2014 Dilatation of the proximal common bowel duct, with mild central intrahepatic biliary ductal dilatation. This appears grossly stable compared to prior CT. Postcholecystectomy. 2. Heterogeneous hepatic parenchyma without focal lesion. 3. The small amount of ascites on prior CT is not seen.  Medical Consultants:   Oncology Dr. Alen Blew   IR for port-a-cath placement  Cardiology for management of anasarca   GI  ID  Other  Consultants:   Physical therapy     IAnti-Infectives:    Vancomycin 12/17 --> 12/21  Aztreonam 12/17 --> 12/21  Cipro 12/21 --> 12/24  Rocephin 12/24 --> 12/26  Flagyl 12/24 --> 12/26  Augmentin 12/27 --> for   Signed:  Leisa Lenz, MD  [facility] 06/13/2014, 10:42 AM  Pager #: [phone]    Discharge Exam: Filed Vitals:   06/13/14 0609  BP: 140/61  Pulse: 88  Temp: 97.9 F (36.6 C)  Resp: 22   Filed Vitals:   06/12/14 0516 06/12/14 1410 06/12/14 2154 06/13/14 0609  BP: 131/58 121/48 126/67 140/61  Pulse: 83 87 88 88  Temp: 98.2 F (36.8 C) 99.4 F (37.4 C) 99.1 F (37.3 C) 97.9 F (36.6 C)  TempSrc: Axillary Axillary Oral Oral  Resp: $Remo'20 20  22  'FvTkV$ Height:      Weight:      SpO2: 97% 92% 96% 96%    General: Pt is not in acute distress Cardiovascular: Regular rate and rhythm, S1/S2 appreciated  Respiratory: diminished, no wheezing  Abdominal: obese, non tender, bowel sounds +, no guarding Extremities: +1-2 LE pitting edema, no cyanosis, pulses palpable bilaterally DP and PT Neuro: Grossly nonfocal  Discharge Instructions  Discharge Instructions    Call MD for:  difficulty breathing, headache or visual  disturbances    Complete by:  As directed      Call MD for:  persistant dizziness or light-headedness    Complete by:  As directed      Call MD for:  persistant nausea and vomiting    Complete by:  As directed      Call MD for:  severe uncontrolled pain    Complete by:  As directed      Diet - low sodium heart healthy    Complete by:  As directed      Discharge instructions    Complete by:  As directed   Continue Augmentin for 7 days on discharge.     Increase activity slowly    Complete by:  As directed             Medication List    STOP taking these medications        aspirin EC 81 MG tablet     clindamycin 300 MG capsule  Commonly known as:  CLEOCIN     insulin NPH-regular Human (70-30) 100  UNIT/ML injection  Commonly known as:  NOVOLIN 70/30  Replaced by:  insulin aspart protamine- aspart (70-30) 100 UNIT/ML injection     lisinopril 20 MG tablet  Commonly known as:  PRINIVIL,ZESTRIL     LORazepam 0.5 MG tablet  Commonly known as:  ATIVAN     metFORMIN 500 MG 24 hr tablet  Commonly known as:  GLUCOPHAGE-XR     sterile water (preservative free) injection     vancomycin 50 mg/mL oral solution  Commonly known as:  VANCOCIN      TAKE these medications        acetaminophen 325 MG tablet  Commonly known as:  TYLENOL  Take 2 tablets (650 mg total) by mouth every 6 (six) hours as needed for mild pain, moderate pain, fever or headache.     ALPRAZolam 0.5 MG tablet  Commonly known as:  XANAX  Take 1-2 tablets (0.5-1 mg total) by mouth 3 (three) times daily as needed for anxiety.     amoxicillin-clavulanate 875-125 MG per tablet  Commonly known as:  AUGMENTIN  Take 1 tablet by mouth every 12 (twelve) hours.     atorvastatin 20 MG tablet  Commonly known as:  LIPITOR  Take 20 mg by mouth at bedtime. ON HOLD     doxazosin 2 MG tablet  Commonly known as:  CARDURA  Take 2 mg by mouth daily before breakfast.     feeding supplement (GLUCERNA SHAKE) Liqd  Take 237 mLs by mouth 2 (two) times daily between meals.     fluorouracil 5 % cream  Commonly known as:  EFUDEX  Apply 1 application topically 2 (two) times daily.     furosemide 20 MG tablet  Commonly known as:  LASIX  Take 1 tablet (20 mg total) by mouth every morning.     hyaluronate sodium Gel  Apply 1 application topically daily. Apply to abdominal area after radiation treatments daily once skin is pink or irritated     insulin aspart 100 UNIT/ML injection  Commonly known as:  novoLOG  Inject 0-15 Units into the skin 3 (three) times daily with meals.     insulin aspart protamine- aspart (70-30) 100 UNIT/ML injection  Commonly known as:  NOVOLOG MIX 70/30  Inject 0.55 mLs (55 Units total) into the skin 2  (two) times daily with a meal.     lidocaine-prilocaine cream  Commonly known as:  EMLA  Apply 1 application topically as needed.     magic mouthwash w/lidocaine Soln  Take 5 mLs by mouth 3 (three) times daily as needed for mouth pain.     metoprolol 100 MG tablet  Commonly known as:  LOPRESSOR  Take 50 mg by mouth 2 (two) times daily. Takes half in am and half tablet in pm     nystatin 100000 UNIT/ML suspension  Commonly known as:  MYCOSTATIN  Take 5 mLs (500,000 Units total) by mouth 4 (four) times daily.     omeprazole 20 MG capsule  Commonly known as:  PRILOSEC  Take 20 mg by mouth 2 (two) times daily.     ondansetron 8 MG tablet  Commonly known as:  ZOFRAN  Take 1 tablet (8 mg total) by mouth every 8 (eight) hours as needed for nausea or vomiting.     potassium chloride 10 MEQ tablet  Commonly known as:  K-DUR,KLOR-CON  Take 1 tablet (10 mEq total) by mouth daily. Takes 20 meq daily     PRESERVISION AREDS PO  Take 1 tablet by mouth 2 (two) times daily.            Follow-up Information    Follow up with SHAW,KIMBERLEE, MD. Schedule an appointment as soon as possible for a visit in 2 weeks.   Specialty:  Family Medicine   Why:  Follow up appt after recent hospitalization   Contact information:   [address] [place] [address] [phone]        The results of significant diagnostics from this hospitalization (including imaging, microbiology, ancillary and laboratory) are listed below for reference.    Significant Diagnostic Studies: Dg Chest 2 View  05/17/2014   CLINICAL DATA:  Shortness of breath and fever. History of hypertension and diabetes.  EXAM: CHEST  2 VIEW  COMPARISON:  05/13/2014; 05/09/2014; chest CT - 05/10/2014  FINDINGS: Grossly unchanged enlarged cardiac silhouette and mediastinal contours with minimal atherosclerotic plaque within the thoracic aorta. The lungs remain hyperexpanded. There is blunting of the bilateral  costophrenic angles suggestive of trace bilateral pleural effusions. Unchanged bibasilar heterogeneous opacities, left greater than right, likely atelectasis. No new focal airspace opacities. No evidence of edema. No pneumothorax. Unchanged bones.  IMPRESSION: 1. Cardiomegaly and suspected trace bilateral effusions without evidence of edema. 2. Unchanged bibasilar opacities, left greater than right, favored to represent atelectasis. No discrete focal airspace opacities to suggest pneumonia.   Electronically Signed   By: Sandi Mariscal M.D.   On: 05/17/2014 09:41   Dg Cervical Spine Complete  05/31/2014   CLINICAL DATA:  Weakness for 2 weeks. Intermittent pain in neck and upper back pain.  EXAM: CERVICAL SPINE  4+ VIEWS  COMPARISON:  None.  FINDINGS: Straightening of normal cervical lordosis. There is multi level disc space narrowing and ventral endplate spurring compatible with degenerative disc disease. The prevertebral soft tissue space is normal. Bilateral neuroforaminal narrowing due to posterior spur formation and facet joint hypertrophy is noted.  IMPRESSION: 1. Cervical degenerative disc disease 2. No acute findings.   Electronically Signed   By: Kerby Moors M.D.   On: 05/31/2014 15:54   Dg Shoulder Right  05/31/2014   CLINICAL DATA:  Shoulder pain  EXAM: RIGHT SHOULDER - 2+ VIEW  COMPARISON:  None.  FINDINGS: Normal alignment no fracture. Mild degenerative change in the [facility] joint.  IMPRESSION: Mild AC degenerative change.  No acute abnormality.   Electronically Signed   By: Franchot Gallo  M.D.   On: 05/31/2014 16:01   Dg Abd 1 View  06/09/2014   CLINICAL DATA:  Biliary stent placement, preceding PTC secondary to pancreatic mass with CBD obstruction  EXAM: ABDOMEN - 1 VIEW; DG C-ARM 1-60 MIN - NRPT [facility]  COMPARISON:  Two digital C-arm fluoroscopic images obtained during the procedure are submitted for interpretation.  FLUOROSCOPY TIME:  2 min 24 seconds  FINDINGS: First image demonstrates presence  of an endoscope with a small amount of contrast and a dilated CBD proximal to the scope.  Second image demonstrates presence of a CBD wall stent with tapered narrowing of the distal aspect of the stent compatible with extrinsic compression by known pancreatic head mass.  IMPRESSION: CBD stenting.   Electronically Signed   By: Lavonia Dana M.D.   On: 06/09/2014 14:26   Dg Abd 1 View  06/08/2014   CLINICAL DATA:  Pancreatic carcinoma  EXAM: DG C-ARM 1-60 MIN - NRPT [facility]; ABDOMEN - 1 VIEW  COMPARISON:  05/31/2014  FINDINGS: Multiple spot films were obtained during and attempted ERCP. Access to the common bile duct was not able to be performed due to the known pancreatic mass.  FLUOROSCOPY TIME:  5 min 6 seconds  IMPRESSION: Inability to cannulate the CBD.   Electronically Signed   By: Inez Catalina M.D.   On: 06/08/2014 12:04   Ct Abdomen Pelvis W Contrast  05/31/2014   CLINICAL DATA:  Generalized abdominal pain, worsening over past 2 weeks. Newly diagnosed pancreatic carcinoma.  EXAM: CT ABDOMEN AND PELVIS WITH CONTRAST  TECHNIQUE: Multidetector CT imaging of the abdomen and pelvis was performed using the standard protocol following bolus administration of intravenous contrast.  CONTRAST:  120mL OMNIPAQUE IOHEXOL 300 MG/ML SOLN, 78mL OMNIPAQUE IOHEXOL 300 MG/ML SOLN  COMPARISON:  05/12/2014  FINDINGS: Lower Chest: Tiny bilateral pleural effusions and dependent atelectasis.  Hepatobiliary: No masses or other significant abnormality identified. Prior cholecystectomy noted.  Pancreas: 6.4 cm mass involving the pancreatic head and descending duodenum. This is not significant changed in size compared to previous study. Increased hazy opacity is seen surrounding the duodenum and pancreatic head with small amount of fluid seen in Morison's pouch. This may be due to duodenitis or pancreatitis.  Spleen:  Within normal limits in size and appearance.  Adrenal Glands:  No mass identified.  Kidneys/Urinary Tract: Tiny less  than 1 cm bilateral renal calculi are again noted. No evidence of ureteral calculi or hydronephrosis. Bilateral perinephric soft tissue stranding and small amount of fluid are stable in appearance and nonspecific.  Stomach/Bowel/Peritoneum: No evidence of wall thickening, mass, or obstruction. Colonic diverticulosis noted. No evidence of diverticulitis.  Vascular/Lymphatic: Peripancreatic lymphadenopathy noted, with largest lymph node measuring 1.8 cm on image 40, which is unchanged.  Reproductive:  No mass or other significant abnormality identified.  Other:  None.  Musculoskeletal:  No suspicious bone lesions identified.  IMPRESSION: Stable 6 cm mass involving the pancreatic head and duodenum, consistent with known pancreatic carcinoma. No evidence of obstruction.  Stable mild peripancreatic lymphadenopathy, consistent metastatic disease.  Increased hazy opacity surrounding the pancreatic head and duodenum and small amount of fluid within Morison's pouch. This may be related to pancreatitis or duodenitis.  Bilateral nonobstructive nephrolithiasis and diverticulosis incidentally noted.   Electronically Signed   By: Earle Gell M.D.   On: 05/31/2014 14:36   Ir Biliary Dilitation  06/09/2014   CLINICAL DATA:  Duodenum mass, biliary obstruction, hyperbilirubinemia, cholangitis, failed ERCP stent placement  EXAM: ULTRASOUND GUIDANCE FOR  BILIARY ACCESS  PERCUTANEOUS TRANSHEPATIC CHOLANGIOGRAM  SUCCESSFUL INSERTION OF A PERCUTANEOUS CATHETER AND GUIDEWIRE INTO THE DUODENUM (TO SERVE AS ERCP ACCESS AND GUIDANCE FOR STENT PLACEMENT)  Date:  12/26/201512/26/2015 11:29 am  Radiologist:  Jerilynn Mages. Daryll Brod, MD  Guidance:  Ultrasound and fluoroscopic  FLUOROSCOPY TIME:  7 min 36 seconds  MEDICATIONS AND MEDICAL HISTORY: 2 g Rocephin administered within 1 hr of the procedure, 2 mg Versed, 100 mcg fentanyl  ANESTHESIA/SEDATION: 25 min  CONTRAST:  83mL OMNIPAQUE IOHEXOL 300 MG/ML  SOLN  COMPLICATIONS: None immediate  PROCEDURE:  Informed consent was obtained from the patient following explanation of the procedure, risks, benefits and alternatives. The patient understands, agrees and consents for the procedure. All questions were addressed. A time out was performed.  Maximal barrier sterile technique utilized including caps, mask, sterile gowns, sterile gloves, large sterile drape, hand hygiene, and Betadine.  Previous imaging reviewed. Under sterile conditions and local anesthesia, initially a peripheral right hepatic duct was punctured with ultrasound. There was return of bile. Limited initial cholangiogram performed opacifying a right hepatic duct. Under fluoroscopy, the opacified right hepatic duct was punctured percutaneously via a second needle stick. There was return of bile. Guidewire advanced into the biliary system. Accustick dilator set advanced into the common hepatic duct. Contrast injection performed for limited cholangiogram. This demonstrates diffuse biliary dilatation with distal obstruction. Patient is status post cholecystectomy. Catheter and guidewire were advanced distally into the duodenum across the obstruction. Amplatz guidewire and Kumpe catheter remain terminating in the duodenum. This was secured externally with a suture. Access was secured externally to be used for the endoscopic portion of the procedure.  Patient tolerated the procedure well.  No immediate complication.  IMPRESSION: Successful ultrasound and fluoroscopic percutaneous cholangiogram with insertion of a catheter and guidewire across the distal CBD obstruction as described. This will serve as ERCP access/guidance for endoscopic stent insertion later today.  Findings discussed with Dr. Deatra Ina.   Electronically Signed   By: Daryll Brod M.D.   On: 06/09/2014 11:56   Nm Pet Image Initial (pi) Skull Base To Thigh  05/29/2014   CLINICAL DATA:  Initial treatment strategy for poorly differentiated carcinoma of unclear primary.  EXAM: NUCLEAR MEDICINE  PET SKULL BASE TO THIGH  TECHNIQUE: 15.9 mCi F-18 FDG was injected intravenously. Full-ring PET imaging was performed from the skull base to thigh after the radiotracer. CT data was obtained and used for attenuation correction and anatomic localization.  FASTING BLOOD GLUCOSE:  Value: 181 mg/dl  COMPARISON:  CT 05/10/2014  FINDINGS: NECK  No hypermetabolic lymph nodes in the neck.  CHEST  No hypermetabolic mediastinal or hilar nodes. No suspicious pulmonary nodules on the CT scan.  ABDOMEN/PELVIS  There is again demonstrated a mass along the second portion duodenum adjacent to pancreatic head measuring approximately 6.7 x 5.7 cm not changed in volume compared to prior. This mass is intensely hypermetabolic with SUV equal 96.2. Posterior to the mass there is a small hypermetabolic lymph node measuring 18 mm on image 143.  No evidence of hepatic metastasis. No additional metabolic lesions in the abdomen or pelvis. There is stranding around the left and right kidney.  SKELETON  No focal hypermetabolic activity to suggest skeletal metastasis.  IMPRESSION: 1. Mass along the C-loop of the duodenum adjacent to pancreatic head is intensely hypermetabolic consistent with carcinoma. 2. Adjacent small peripancreatic hypermetabolic lymph node. 3. No additional hypermetabolic adenopathy or metastatic disease.   Electronically Signed   By: Helane Gunther.D.  On: 05/29/2014 16:17   US Abdomen Limited  06/07/2014   CLINICAL DATA:  Elevated LFTs. History of cholecystectomy. Pancreatic cancer.  EXAM: US ABDOMEN LIMITED - RIGHT UPPER QUADRANT  COMPARISON:  CT abdomen 05/31/2014  FINDINGS: Gallbladder:  Surgically absent.  Common bile duct:  Diameter: Dilated measuring 1.7 cm at the porta hepatis. There is mild central intrahepatic biliary dilatation.  Liver:  No focal lesion identified. Liver parenchyma is heterogeneous and difficult to penetrate.  Other: The small amount of ascites on prior CT is not seen. The known  pancreatic head mass is identified.  IMPRESSION: 1. Dilatation of the proximal common bowel duct, with mild central intrahepatic biliary ductal dilatation. This appears grossly stable compared to prior CT. Postcholecystectomy. 2. Heterogeneous hepatic parenchyma without focal lesion. 3. The small amount of ascites on prior CT is not seen.   Electronically Signed   By: Jeb Levering M.D.   On: 06/07/2014 12:12   Ir Ptc  06/09/2014   CLINICAL DATA:  Duodenum mass, biliary obstruction, hyperbilirubinemia, cholangitis, failed ERCP stent placement  EXAM: ULTRASOUND GUIDANCE FOR BILIARY ACCESS  PERCUTANEOUS TRANSHEPATIC CHOLANGIOGRAM  SUCCESSFUL INSERTION OF A PERCUTANEOUS CATHETER AND GUIDEWIRE INTO THE DUODENUM (TO SERVE AS ERCP ACCESS AND GUIDANCE FOR STENT PLACEMENT)  Date:  12/26/201512/26/2015 11:29 am  Radiologist:  Jerilynn Mages. Daryll Brod, MD  Guidance:  Ultrasound and fluoroscopic  FLUOROSCOPY TIME:  7 min 36 seconds  MEDICATIONS AND MEDICAL HISTORY: 2 g Rocephin administered within 1 hr of the procedure, 2 mg Versed, 100 mcg fentanyl  ANESTHESIA/SEDATION: 25 min  CONTRAST:  32mL OMNIPAQUE IOHEXOL 300 MG/ML  SOLN  COMPLICATIONS: None immediate  PROCEDURE: Informed consent was obtained from the patient following explanation of the procedure, risks, benefits and alternatives. The patient understands, agrees and consents for the procedure. All questions were addressed. A time out was performed.  Maximal barrier sterile technique utilized including caps, mask, sterile gowns, sterile gloves, large sterile drape, hand hygiene, and Betadine.  Previous imaging reviewed. Under sterile conditions and local anesthesia, initially a peripheral right hepatic duct was punctured with ultrasound. There was return of bile. Limited initial cholangiogram performed opacifying a right hepatic duct. Under fluoroscopy, the opacified right hepatic duct was punctured percutaneously via a second needle stick. There was return of bile.  Guidewire advanced into the biliary system. Accustick dilator set advanced into the common hepatic duct. Contrast injection performed for limited cholangiogram. This demonstrates diffuse biliary dilatation with distal obstruction. Patient is status post cholecystectomy. Catheter and guidewire were advanced distally into the duodenum across the obstruction. Amplatz guidewire and Kumpe catheter remain terminating in the duodenum. This was secured externally with a suture. Access was secured externally to be used for the endoscopic portion of the procedure.  Patient tolerated the procedure well.  No immediate complication.  IMPRESSION: Successful ultrasound and fluoroscopic percutaneous cholangiogram with insertion of a catheter and guidewire across the distal CBD obstruction as described. This will serve as ERCP access/guidance for endoscopic stent insertion later today.  Findings discussed with Dr. Deatra Ina.   Electronically Signed   By: Daryll Brod M.D.   On: 06/09/2014 11:56   [place] Guidance  06/09/2014   CLINICAL DATA:  Duodenum mass, biliary obstruction, hyperbilirubinemia, cholangitis, failed ERCP stent placement  EXAM: ULTRASOUND GUIDANCE FOR BILIARY ACCESS  PERCUTANEOUS TRANSHEPATIC CHOLANGIOGRAM  SUCCESSFUL INSERTION OF A PERCUTANEOUS CATHETER AND GUIDEWIRE INTO THE DUODENUM (TO SERVE AS ERCP ACCESS AND GUIDANCE FOR STENT PLACEMENT)  Date:  12/26/201512/26/2015 11:29 am  Radiologist:  Jerilynn Mages.  Ruel Favors, MD  Guidance:  Ultrasound and fluoroscopic  FLUOROSCOPY TIME:  7 min 36 seconds  MEDICATIONS AND MEDICAL HISTORY: 2 g Rocephin administered within 1 hr of the procedure, 2 mg Versed, 100 mcg fentanyl  ANESTHESIA/SEDATION: 25 min  CONTRAST:  69mL OMNIPAQUE IOHEXOL 300 MG/ML  SOLN  COMPLICATIONS: None immediate  PROCEDURE: Informed consent was obtained from the patient following explanation of the procedure, risks, benefits and alternatives. The patient understands, agrees and consents for the procedure. All  questions were addressed. A time out was performed.  Maximal barrier sterile technique utilized including caps, mask, sterile gowns, sterile gloves, large sterile drape, hand hygiene, and Betadine.  Previous imaging reviewed. Under sterile conditions and local anesthesia, initially a peripheral right hepatic duct was punctured with ultrasound. There was return of bile. Limited initial cholangiogram performed opacifying a right hepatic duct. Under fluoroscopy, the opacified right hepatic duct was punctured percutaneously via a second needle stick. There was return of bile. Guidewire advanced into the biliary system. Accustick dilator set advanced into the common hepatic duct. Contrast injection performed for limited cholangiogram. This demonstrates diffuse biliary dilatation with distal obstruction. Patient is status post cholecystectomy. Catheter and guidewire were advanced distally into the duodenum across the obstruction. Amplatz guidewire and Kumpe catheter remain terminating in the duodenum. This was secured externally with a suture. Access was secured externally to be used for the endoscopic portion of the procedure.  Patient tolerated the procedure well.  No immediate complication.  IMPRESSION: Successful ultrasound and fluoroscopic percutaneous cholangiogram with insertion of a catheter and guidewire across the distal CBD obstruction as described. This will serve as ERCP access/guidance for endoscopic stent insertion later today.  Findings discussed with Dr. Arlyce Dice.   Electronically Signed   By: Ruel Favors M.D.   On: 06/09/2014 11:56   Ct Biopsy  05/17/2014   INDICATION: History of duodenal mass. Patient underwent nondiagnostic endoscopic biopsy and presents now for CT-guided biopsy of a peripancreatic/duodenal lymph node for tissue diagnostic purposes  EXAM: CT GUIDED BIOPSY OF PERIPANCREATIC/DUODENAL LYMPH NODE  COMPARISON:  CT the abdomen pelvis - 05/10/2014  MEDICATIONS: Fentanyl 50 mcg IV; Versed  1.5 mg IV  ANESTHESIA/SEDATION: Sedation time  20 minutes  CONTRAST:  None  COMPLICATIONS: None immediate  PROCEDURE: Informed consent was obtained from the patient following an explanation of the procedure, risks, benefits and alternatives. A time out was performed prior to the initiation of the procedure.  The patient was positioned right lateral decubitus on the CT table and a limited CT was performed for procedural planning demonstrating unchanged size and appearance of previously identified approximately 2.2 x 1.9 cm enlarged peripancreatic/duodenal lymph node immediately adjacent to the large intraluminal duodenal mass (image 40, series 2). The procedure was planned. The operative site was prepped and draped in the usual sterile fashion. Appropriate trajectory was confirmed with a 22 gauge spinal needle after the adjacent tissues were anesthetized with 1% Lidocaine with epinephrine.  Under intermittent CT guidance, a 17 gauge coaxial needle was advanced adjacent to the peripheral aspect of the lymph node (note, given patient body habitus, the coaxial needle was still approximately 2.3 cm too short for purchase directly within in the enlarged lymph node (representative image 2, series 6).  4 core needle samples were obtained with an 18 gauge core needle biopsy device while slightly advancing the biopsy device system. The co-axial needle was removed and hemostasis was achieved with manual compression.  A limited postprocedural CT was negative for hemorrhage or additional  complication. A dressing was placed. The patient tolerated the procedure well without immediate postprocedural complication.  IMPRESSION: Technically successful CT guided core needle biopsy of dominant peripancreatic/duodenal lymph node.   Electronically Signed   By: Sandi Mariscal M.D.   On: 05/17/2014 10:55   Dg Chest Port 1 View  06/02/2014   CLINICAL DATA:  Dyspnea  EXAM: PORTABLE CHEST - 1 VIEW  COMPARISON:  May 31, 2014  FINDINGS:  Central catheter tip is in the superior vena cava. No pneumothorax. There is atelectatic change in the left base. Lungs elsewhere clear. Heart is borderline enlarged in size with pulmonary vascularity within normal limits. No adenopathy.  IMPRESSION: Central catheter tip in superior vena cava. No pneumothorax. Left base atelectasis. Elsewhere lungs clear. No change in cardiac silhouette.   Electronically Signed   By: Lowella Grip M.D.   On: 06/02/2014 11:04   Dg Shoulder Left  05/31/2014   CLINICAL DATA:  Pain and weakness  EXAM: LEFT SHOULDER - 2+ VIEW  COMPARISON:  None.  FINDINGS: Frontal, Y scapular, and axillary images were obtained. There is no demonstrable fracture or dislocation. There is osteoarthritic change in the glenohumeral and acromioclavicular joints. No erosive change or intra-articular calcification.  IMPRESSION: Osteoarthritic change.  No fracture or dislocation.   Electronically Signed   By: Lowella Grip M.D.   On: 05/31/2014 15:53   Dg Abd Acute W/chest  05/31/2014   CLINICAL DATA:  Abdominal pain with nausea.  Pancreatic carcinoma.  EXAM: ACUTE ABDOMEN SERIES (ABDOMEN 2 VIEW & CHEST 1 VIEW)  COMPARISON:  PET-CT May 29, 2014 ; chest radiograph May 17, 2014  FINDINGS: PA chest: There is atelectatic change in the left base. Elsewhere lungs are clear. Heart is borderline enlarged with pulmonary vascularity within normal limits. No adenopathy.  Supine and left lateral decubitus abdomen: There is moderate stool throughout the colon. There is no bowel dilatation or air-fluid level suggesting obstruction. No free air. Surgical clips are present in the right upper quadrant. There is a phlebolith in left pelvis.  IMPRESSION: Unremarkable bowel gas pattern. No obstruction or free air. Left base atelectasis. No lung edema or consolidation.   Electronically Signed   By: Lowella Grip M.D.   On: 05/31/2014 12:22   Dg C-arm 1-60 Min-no Report  06/09/2014   CLINICAL DATA:   Biliary stent placement, preceding PTC secondary to pancreatic mass with CBD obstruction  EXAM: ABDOMEN - 1 VIEW; DG C-ARM 1-60 MIN - NRPT [facility]  COMPARISON:  Two digital C-arm fluoroscopic images obtained during the procedure are submitted for interpretation.  FLUOROSCOPY TIME:  2 min 24 seconds  FINDINGS: First image demonstrates presence of an endoscope with a small amount of contrast and a dilated CBD proximal to the scope.  Second image demonstrates presence of a CBD wall stent with tapered narrowing of the distal aspect of the stent compatible with extrinsic compression by known pancreatic head mass.  IMPRESSION: CBD stenting.   Electronically Signed   By: Lavonia Dana M.D.   On: 06/09/2014 14:26   Dg C-arm 1-60 Min-no Report  06/08/2014   CLINICAL DATA:  Pancreatic carcinoma  EXAM: DG C-ARM 1-60 MIN - NRPT [facility]; ABDOMEN - 1 VIEW  COMPARISON:  05/31/2014  FINDINGS: Multiple spot films were obtained during and attempted ERCP. Access to the common bile duct was not able to be performed due to the known pancreatic mass.  FLUOROSCOPY TIME:  5 min 6 seconds  IMPRESSION: Inability to cannulate the CBD.  Electronically Signed   By: Inez Catalina M.D.   On: 06/08/2014 12:04    Microbiology: No results found for this or any previous visit (from the past 240 hour(s)).   Labs: Basic Metabolic Panel:  Recent Labs Lab 06/09/14 0422 06/10/14 0510 06/11/14 0525 06/12/14 0506 06/13/14 0420  NA 130* 131* 130* 133* 130*  K 4.3 4.2 4.0 4.0 4.3  CL 97 98 98 99 97  CO2 $Re'24 26 24 26 22  'oDL$ GLUCOSE 347* 331* 241* 163* 179*  BUN 36* 45* 51* 49* 51*  CREATININE 1.37* 1.94* 1.91* 1.71* 1.50*  CALCIUM 7.8* 7.9* 7.6* 8.0* 8.0*   Liver Function Tests:  Recent Labs Lab 06/09/14 0422 06/10/14 0510 06/11/14 0525 06/12/14 0506 06/13/14 0420  AST 157* 76* 33 23 26  ALT 137* 100* 59* 37 27  ALKPHOS 413* 397* 293* 204* 188*  BILITOT 4.7* 2.0* 1.2 1.2 1.3*  PROT 5.6* 5.6* 5.6* 5.5* 6.0  ALBUMIN 1.5* 1.4* 1.6*  1.4* 1.6*   No results for input(s): LIPASE, AMYLASE in the last 168 hours. No results for input(s): AMMONIA in the last 168 hours. CBC:  Recent Labs Lab 06/09/14 0422 06/10/14 0510 06/11/14 0525 06/12/14 0506 06/13/14 0420  WBC 18.4* 24.4* 29.0* 26.6* 27.0*  HGB 8.0* 8.4* 8.4* 7.8* 9.0*  HCT 25.2* 26.6* 25.8* 24.2* 28.4*  MCV 91.3 90.2 89.3 91.3 91.0  PLT 294 328 323 311 267   Cardiac Enzymes: No results for input(s): CKTOTAL, CKMB, CKMBINDEX, TROPONINI in the last 168 hours. BNP: BNP (last 3 results)  Recent Labs  01/12/14 1244  PROBNP 67.7   CBG:  Recent Labs Lab 06/12/14 0730 06/12/14 1135 06/12/14 1659 06/12/14 2154 06/13/14 0729  GLUCAP 167* 250* 240* 196* 204*    Time coordinating discharge: Over 30 minutes

## 2014-06-13 NOTE — Progress Notes (Signed)
Weekly rad txs 3/completed, patient denies pain or nausea, still on liquid diet, see that he will be d/c to [facility] tomorrow,printed scheduled calendar with my business card  for his radiation treatments and my business card, gave this to wife and they are to transport patient  For his radiation treatments per wife,went over side effects radiation with wife, she has the book already given yesterday,  10:46 AM

## 2014-06-14 ENCOUNTER — Encounter: Payer: Self-pay | Admitting: *Deleted

## 2014-06-14 ENCOUNTER — Ambulatory Visit
Admission: RE | Admit: 2014-06-14 | Discharge: 2014-06-14 | Disposition: A | Discharge status: Home / Self Care | Payer: Medicare Other | Source: Ambulatory Visit | Attending: Radiation Oncology | Admitting: Radiation Oncology

## 2014-06-14 ENCOUNTER — Encounter (HOSPITAL_COMMUNITY): Payer: Self-pay | Admitting: Gastroenterology

## 2014-06-14 LAB — GLUCOSE, CAPILLARY
GLUCOSE-CAPILLARY: 217 mg/dL — AB (ref 70–99)
Glucose-Capillary: 185 mg/dL — ABNORMAL HIGH (ref 70–99)

## 2014-06-14 MED ORDER — CHLORPROMAZINE HCL 25 MG/ML IJ SOLN
25.0000 mg | Freq: Once | INTRAMUSCULAR | Status: AC
  Administered 2014-06-14: 25 mg via INTRAMUSCULAR
  Filled 2014-06-14: qty 1

## 2014-06-14 NOTE — Progress Notes (Signed)
Patient self administered CPAP. RT will continue to monitor.

## 2014-06-14 NOTE — Progress Notes (Signed)
Clinical Social Work  CSW faxed DC summary to [facility] who is agreeable to accept today. CSW prepared DC packet with FL2, DC summary and hard scripts included. RN to call report to SNF. CSW met with patient and family at bedside to explain DC plans. Patient reports he is feeling better and happy to DC to start rehab. Patient and family prefer [facility] for transportation and is aware of no guarantee of payment. [facility] arranged for 1:30pm pick and request #: [phone].  CSW is signing off but available if needed.  [place], [place] [phone]

## 2014-06-14 NOTE — Progress Notes (Signed)
Report called to [facility]. Report given to Delrae Rend, LPN. No questions at this time. Unit number left for staff if they have questions. Patient transported via [place].

## 2014-06-14 NOTE — Progress Notes (Signed)
Patient was seen and examined at the bedside. Patient's stepdaughter at the bedside this morning.  Patient is more alert and awake this morning. He is medically stable for discharge to skilled nursing facility today.  Please refer to discharge summary done 06/13/2014.  Leisa Lenz [facility]  [phone]

## 2014-06-18 ENCOUNTER — Other Ambulatory Visit: Payer: Self-pay | Admitting: *Deleted

## 2014-06-18 ENCOUNTER — Ambulatory Visit
Admission: RE | Admit: 2014-06-18 | Discharge: 2014-06-18 | Disposition: A | Discharge status: Home / Self Care | Payer: Medicare Other | Source: Ambulatory Visit | Attending: Radiation Oncology | Admitting: Radiation Oncology

## 2014-06-19 ENCOUNTER — Encounter: Payer: Self-pay | Admitting: *Deleted

## 2014-06-19 ENCOUNTER — Inpatient Hospital Stay (HOSPITAL_COMMUNITY)
Admission: EM | Admit: 2014-06-19 | Discharge: 2014-06-25 | DRG: 871 | Disposition: A | Discharge status: Home / Self Care | Payer: Medicare Other | Attending: Internal Medicine | Admitting: Internal Medicine

## 2014-06-19 ENCOUNTER — Inpatient Hospital Stay (HOSPITAL_COMMUNITY): Payer: Medicare Other

## 2014-06-19 ENCOUNTER — Emergency Department (HOSPITAL_COMMUNITY): Payer: Medicare Other

## 2014-06-19 ENCOUNTER — Ambulatory Visit
Admission: RE | Admit: 2014-06-19 | Discharge: 2014-06-19 | Disposition: A | Discharge status: Home / Self Care | Payer: Medicare Other | Source: Ambulatory Visit | Attending: Radiation Oncology | Admitting: Radiation Oncology

## 2014-06-19 ENCOUNTER — Encounter (HOSPITAL_COMMUNITY): Payer: Self-pay

## 2014-06-19 DIAGNOSIS — E876 Hypokalemia: Secondary | ICD-10-CM | POA: Diagnosis present

## 2014-06-19 DIAGNOSIS — R6521 Severe sepsis with septic shock: Secondary | ICD-10-CM

## 2014-06-19 DIAGNOSIS — Z923 Personal history of irradiation: Secondary | ICD-10-CM | POA: Diagnosis not present

## 2014-06-19 DIAGNOSIS — E871 Hypo-osmolality and hyponatremia: Secondary | ICD-10-CM | POA: Diagnosis present

## 2014-06-19 DIAGNOSIS — G4733 Obstructive sleep apnea (adult) (pediatric): Secondary | ICD-10-CM | POA: Diagnosis present

## 2014-06-19 DIAGNOSIS — A419 Sepsis, unspecified organism: Secondary | ICD-10-CM | POA: Diagnosis present

## 2014-06-19 DIAGNOSIS — B379 Candidiasis, unspecified: Secondary | ICD-10-CM | POA: Diagnosis present

## 2014-06-19 DIAGNOSIS — Z66 Do not resuscitate: Secondary | ICD-10-CM | POA: Diagnosis present

## 2014-06-19 DIAGNOSIS — E785 Hyperlipidemia, unspecified: Secondary | ICD-10-CM | POA: Diagnosis present

## 2014-06-19 DIAGNOSIS — A047 Enterocolitis due to Clostridium difficile: Secondary | ICD-10-CM | POA: Diagnosis present

## 2014-06-19 DIAGNOSIS — Z881 Allergy status to other antibiotic agents status: Secondary | ICD-10-CM

## 2014-06-19 DIAGNOSIS — N3289 Other specified disorders of bladder: Secondary | ICD-10-CM | POA: Diagnosis present

## 2014-06-19 DIAGNOSIS — Z87891 Personal history of nicotine dependence: Secondary | ICD-10-CM

## 2014-06-19 DIAGNOSIS — Z794 Long term (current) use of insulin: Secondary | ICD-10-CM | POA: Diagnosis not present

## 2014-06-19 DIAGNOSIS — R140 Abdominal distension (gaseous): Secondary | ICD-10-CM

## 2014-06-19 DIAGNOSIS — Z87442 Personal history of urinary calculi: Secondary | ICD-10-CM

## 2014-06-19 DIAGNOSIS — N183 Chronic kidney disease, stage 3 (moderate): Secondary | ICD-10-CM | POA: Diagnosis present

## 2014-06-19 DIAGNOSIS — R509 Fever, unspecified: Secondary | ICD-10-CM

## 2014-06-19 DIAGNOSIS — N179 Acute kidney failure, unspecified: Secondary | ICD-10-CM | POA: Diagnosis present

## 2014-06-19 DIAGNOSIS — D649 Anemia, unspecified: Secondary | ICD-10-CM | POA: Diagnosis present

## 2014-06-19 DIAGNOSIS — E1165 Type 2 diabetes mellitus with hyperglycemia: Secondary | ICD-10-CM | POA: Diagnosis present

## 2014-06-19 DIAGNOSIS — K1230 Oral mucositis (ulcerative), unspecified: Secondary | ICD-10-CM | POA: Diagnosis present

## 2014-06-19 DIAGNOSIS — Z885 Allergy status to narcotic agent status: Secondary | ICD-10-CM | POA: Diagnosis not present

## 2014-06-19 DIAGNOSIS — K219 Gastro-esophageal reflux disease without esophagitis: Secondary | ICD-10-CM | POA: Diagnosis present

## 2014-06-19 DIAGNOSIS — K567 Ileus, unspecified: Secondary | ICD-10-CM | POA: Diagnosis present

## 2014-06-19 DIAGNOSIS — E872 Acidosis: Secondary | ICD-10-CM | POA: Diagnosis present

## 2014-06-19 DIAGNOSIS — Z9049 Acquired absence of other specified parts of digestive tract: Secondary | ICD-10-CM | POA: Diagnosis present

## 2014-06-19 DIAGNOSIS — C259 Malignant neoplasm of pancreas, unspecified: Secondary | ICD-10-CM | POA: Diagnosis present

## 2014-06-19 DIAGNOSIS — K830 Cholangitis: Secondary | ICD-10-CM | POA: Diagnosis present

## 2014-06-19 DIAGNOSIS — C250 Malignant neoplasm of head of pancreas: Secondary | ICD-10-CM | POA: Diagnosis present

## 2014-06-19 DIAGNOSIS — Z880 Allergy status to penicillin: Secondary | ICD-10-CM

## 2014-06-19 DIAGNOSIS — I129 Hypertensive chronic kidney disease with stage 1 through stage 4 chronic kidney disease, or unspecified chronic kidney disease: Secondary | ICD-10-CM | POA: Diagnosis present

## 2014-06-19 DIAGNOSIS — E43 Unspecified severe protein-calorie malnutrition: Secondary | ICD-10-CM | POA: Diagnosis present

## 2014-06-19 DIAGNOSIS — I2510 Atherosclerotic heart disease of native coronary artery without angina pectoris: Secondary | ICD-10-CM | POA: Diagnosis present

## 2014-06-19 DIAGNOSIS — A0472 Enterocolitis due to Clostridium difficile, not specified as recurrent: Secondary | ICD-10-CM | POA: Diagnosis present

## 2014-06-19 LAB — COMPREHENSIVE METABOLIC PANEL
ALBUMIN: 1.7 g/dL — AB (ref 3.5–5.2)
ALT: 18 U/L (ref 0–53)
ANION GAP: 12 (ref 5–15)
AST: 34 U/L (ref 0–37)
Alkaline Phosphatase: 122 U/L — ABNORMAL HIGH (ref 39–117)
BILIRUBIN TOTAL: 1.1 mg/dL (ref 0.3–1.2)
BUN: 81 mg/dL — AB (ref 6–23)
CALCIUM: 8.1 mg/dL — AB (ref 8.4–10.5)
CO2: 18 mmol/L — ABNORMAL LOW (ref 19–32)
Chloride: 100 mEq/L (ref 96–112)
Creatinine, Ser: 1.77 mg/dL — ABNORMAL HIGH (ref 0.50–1.35)
GFR calc Af Amer: 42 mL/min — ABNORMAL LOW (ref >90)
GFR calc non Af Amer: 37 mL/min — ABNORMAL LOW (ref >90)
Glucose, Bld: 326 mg/dL — ABNORMAL HIGH (ref 70–99)
Potassium: 3.9 mmol/L (ref 3.5–5.1)
Sodium: 130 mmol/L — ABNORMAL LOW (ref 135–145)
TOTAL PROTEIN: 6.1 g/dL (ref 6.0–8.3)

## 2014-06-19 LAB — CBC WITH DIFFERENTIAL/PLATELET
BASOS PCT: 0 % (ref 0–1)
Basophils Absolute: 0.1 10*3/uL (ref 0.0–0.1)
EOS PCT: 0 % (ref 0–5)
Eosinophils Absolute: 0 10*3/uL (ref 0.0–0.7)
HCT: 23.6 % — ABNORMAL LOW (ref 39.0–52.0)
Hemoglobin: 7.3 g/dL — ABNORMAL LOW (ref 13.0–17.0)
Lymphocytes Relative: 2 % — ABNORMAL LOW (ref 12–46)
Lymphs Abs: 1.1 10*3/uL (ref 0.7–4.0)
MCH: 28.9 pg (ref 26.0–34.0)
MCHC: 30.9 g/dL (ref 30.0–36.0)
MCV: 93.3 fL (ref 78.0–100.0)
Monocytes Absolute: 2.5 10*3/uL — ABNORMAL HIGH (ref 0.1–1.0)
Monocytes Relative: 5 % (ref 3–12)
NEUTROS PCT: 93 % — AB (ref 43–77)
Neutro Abs: 45 10*3/uL — ABNORMAL HIGH (ref 1.7–7.7)
Platelets: 271 10*3/uL (ref 150–400)
RBC: 2.53 MIL/uL — ABNORMAL LOW (ref 4.22–5.81)
RDW: 18.4 % — ABNORMAL HIGH (ref 11.5–15.5)
WBC: 48.7 10*3/uL — ABNORMAL HIGH (ref 4.0–10.5)

## 2014-06-19 LAB — I-STAT TROPONIN, ED: Troponin i, poc: 0 ng/mL (ref 0.00–0.08)

## 2014-06-19 LAB — CARBOXYHEMOGLOBIN
Carboxyhemoglobin: 2.1 % — ABNORMAL HIGH (ref 0.5–1.5)
METHEMOGLOBIN: 2.1 % — AB (ref 0.0–1.5)
O2 Saturation: 57.4 %
Total hemoglobin: 7.1 g/dL — ABNORMAL LOW (ref 13.5–18.0)

## 2014-06-19 LAB — FIBRINOGEN: Fibrinogen: 503 mg/dL — ABNORMAL HIGH (ref 204–475)

## 2014-06-19 LAB — TYPE AND SCREEN
ABO/RH(D): O POS
Antibody Screen: NEGATIVE

## 2014-06-19 LAB — URINALYSIS, ROUTINE W REFLEX MICROSCOPIC
Bilirubin Urine: NEGATIVE
Glucose, UA: NEGATIVE mg/dL
Hgb urine dipstick: NEGATIVE
KETONES UR: NEGATIVE mg/dL
Leukocytes, UA: NEGATIVE
NITRITE: NEGATIVE
PROTEIN: NEGATIVE mg/dL
Specific Gravity, Urine: 1.016 (ref 1.005–1.030)
UROBILINOGEN UA: 0.2 mg/dL (ref 0.0–1.0)
pH: 5 (ref 5.0–8.0)

## 2014-06-19 LAB — MRSA PCR SCREENING: MRSA by PCR: NEGATIVE

## 2014-06-19 LAB — APTT: APTT: 29 s (ref 24–37)

## 2014-06-19 LAB — LACTIC ACID, PLASMA
Lactic Acid, Venous: 4.8 mmol/L — ABNORMAL HIGH (ref 0.5–2.2)
Lactic Acid, Venous: 3 mmol/L — ABNORMAL HIGH (ref 0.5–2.2)

## 2014-06-19 LAB — LACTATE DEHYDROGENASE: LDH: 157 U/L (ref 94–250)

## 2014-06-19 LAB — PROTIME-INR
INR: 1.29 (ref 0.00–1.49)
Prothrombin Time: 16.3 seconds — ABNORMAL HIGH (ref 11.6–15.2)

## 2014-06-19 LAB — MAGNESIUM: Magnesium: 2.2 mg/dL (ref 1.5–2.5)

## 2014-06-19 LAB — PHOSPHORUS: PHOSPHORUS: 5.6 mg/dL — AB (ref 2.3–4.6)

## 2014-06-19 LAB — LIPASE, BLOOD
LIPASE: 21 U/L (ref 11–59)
LIPASE: 19 U/L (ref 11–59)

## 2014-06-19 LAB — I-STAT CG4 LACTIC ACID, ED: LACTIC ACID, VENOUS: 5.67 mmol/L — AB (ref 0.5–2.2)

## 2014-06-19 MED ORDER — METRONIDAZOLE IN NACL 5-0.79 MG/ML-% IV SOLN
500.0000 mg | Freq: Four times a day (QID) | INTRAVENOUS | Status: DC
  Administered 2014-06-19 – 2014-06-22 (×11): 500 mg via INTRAVENOUS
  Filled 2014-06-19 (×11): qty 100

## 2014-06-19 MED ORDER — PIPERACILLIN-TAZOBACTAM 3.375 G IVPB 30 MIN: 3.3750 g | Freq: Once | INTRAVENOUS | Status: DC

## 2014-06-19 MED ORDER — SODIUM CHLORIDE 0.9 % IV SOLN
INTRAVENOUS | Status: DC
  Administered 2014-06-19: 21:00:00 via INTRAVENOUS

## 2014-06-19 MED ORDER — IOHEXOL 300 MG/ML  SOLN: 100.0000 mL | Freq: Once | INTRAMUSCULAR | Status: AC | PRN

## 2014-06-19 MED ORDER — SODIUM BICARBONATE 8.4 % IV SOLN
Freq: Once | INTRAVENOUS | Status: AC
  Administered 2014-06-19: 18:00:00 via INTRAVENOUS
  Filled 2014-06-19: qty 150

## 2014-06-19 MED ORDER — PIPERACILLIN-TAZOBACTAM 3.375 G IVPB
3.3750 g | Freq: Three times a day (TID) | INTRAVENOUS | Status: DC
  Administered 2014-06-19 – 2014-06-21 (×5): 3.375 g via INTRAVENOUS
  Filled 2014-06-19 (×5): qty 50

## 2014-06-19 MED ORDER — VANCOMYCIN HCL 10 G IV SOLR
2500.0000 mg | INTRAVENOUS | Status: AC
  Administered 2014-06-19: 2500 mg via INTRAVENOUS
  Filled 2014-06-19: qty 2500

## 2014-06-19 MED ORDER — SODIUM BICARBONATE 8.4 % IV SOLN: INTRAVENOUS | Status: DC

## 2014-06-19 MED ORDER — VANCOMYCIN HCL 500 MG IV SOLR
500.0000 mg | Freq: Four times a day (QID) | Status: DC
  Administered 2014-06-19 – 2014-06-20 (×2): 500 mg via RECTAL
  Filled 2014-06-19 (×8): qty 500

## 2014-06-19 MED ORDER — IOHEXOL 300 MG/ML  SOLN
50.0000 mL | Freq: Once | INTRAMUSCULAR | Status: AC | PRN
  Administered 2014-06-19: 50 mL via ORAL

## 2014-06-19 MED ORDER — SODIUM CHLORIDE 0.9 % IV BOLUS (SEPSIS)
1000.0000 mL | Freq: Once | INTRAVENOUS | Status: AC
  Administered 2014-06-19: 1000 mL via INTRAVENOUS

## 2014-06-19 MED ORDER — SODIUM CHLORIDE 0.9 % IV BOLUS (SEPSIS)
500.0000 mL | Freq: Once | INTRAVENOUS | Status: AC
  Administered 2014-06-19: 500 mL via INTRAVENOUS

## 2014-06-19 MED ORDER — PIPERACILLIN-TAZOBACTAM 3.375 G IVPB: 3.3750 g | Freq: Three times a day (TID) | INTRAVENOUS | Status: DC

## 2014-06-19 MED ORDER — HEPARIN SODIUM (PORCINE) 5000 UNIT/ML IJ SOLN
5000.0000 [IU] | Freq: Three times a day (TID) | INTRAMUSCULAR | Status: DC
  Filled 2014-06-19 (×2): qty 1

## 2014-06-19 MED ORDER — VANCOMYCIN HCL 10 G IV SOLR
1500.0000 mg | INTRAVENOUS | Status: DC
  Administered 2014-06-20: 1500 mg via INTRAVENOUS
  Filled 2014-06-19: qty 1500

## 2014-06-19 MED ORDER — HYDROCORTISONE NA SUCCINATE PF 100 MG IJ SOLR
50.0000 mg | Freq: Four times a day (QID) | INTRAMUSCULAR | Status: DC
  Administered 2014-06-19 – 2014-06-20 (×3): 50 mg via INTRAVENOUS
  Filled 2014-06-19 (×3): qty 2

## 2014-06-19 MED ORDER — SODIUM CHLORIDE 0.9 % IV SOLN
INTRAVENOUS | Status: DC
  Administered 2014-06-20 – 2014-06-22 (×4): via INTRAVENOUS
  Administered 2014-06-22: 1000 mL via INTRAVENOUS
  Administered 2014-06-23: 06:00:00 via INTRAVENOUS

## 2014-06-19 MED ORDER — SODIUM CHLORIDE 0.9 % IV BOLUS (SEPSIS): 1000.0000 mL | Freq: Once | INTRAVENOUS | Status: DC

## 2014-06-19 MED ORDER — DEXTROSE 5 % IV SOLN
5.0000 ug/min | INTRAVENOUS | Status: DC
  Administered 2014-06-19: 5 ug/min via INTRAVENOUS
  Filled 2014-06-19: qty 4

## 2014-06-19 MED ORDER — SODIUM CHLORIDE 0.9 % IV SOLN: INTRAVENOUS | Status: DC

## 2014-06-19 MED ORDER — SODIUM CHLORIDE 0.9 % IV BOLUS (SEPSIS): 500.0000 mL | INTRAVENOUS | Status: AC

## 2014-06-19 MED ORDER — INSULIN ASPART 100 UNIT/ML ~~LOC~~ SOLN
0.0000 [IU] | SUBCUTANEOUS | Status: DC
Start: 2014-06-19 — End: 2014-06-20
  Administered 2014-06-19: 15 [IU] via SUBCUTANEOUS

## 2014-06-19 MED ORDER — SODIUM CHLORIDE 0.9 % IV BOLUS (SEPSIS)
1000.0000 mL | INTRAVENOUS | Status: AC
  Administered 2014-06-19: 1000 mL via INTRAVENOUS

## 2014-06-19 MED ORDER — HYDROCORTISONE NA SUCCINATE PF 100 MG IJ SOLR: 50.0000 mg | Freq: Four times a day (QID) | INTRAMUSCULAR | Status: DC

## 2014-06-19 MED ORDER — VANCOMYCIN 50 MG/ML ORAL SOLUTION
500.0000 mg | Freq: Four times a day (QID) | ORAL | Status: DC
  Administered 2014-06-19 – 2014-06-23 (×16): 500 mg via ORAL
  Filled 2014-06-19 (×28): qty 10

## 2014-06-19 MED ORDER — SODIUM CHLORIDE 0.9 % IV SOLN
INTRAVENOUS | Status: DC
  Administered 2014-06-19: 21:00:00 via INTRAVENOUS
  Administered 2014-06-20: 1000 mL via INTRAVENOUS

## 2014-06-19 NOTE — ED Provider Notes (Signed)
CSN: [id number]     Arrival date & time 06/19/14  1208 History   First MD Initiated Contact with Patient 06/19/14 1221     Chief Complaint  Patient presents with  . Hypotension     (Consider location/radiation/quality/duration/timing/severity/associated sxs/prior Treatment) The history is provided by the patient, the spouse and a relative.   patient was sent from cancer center with hypotension. His recent admission for biliary infection and has pancreatic cancer. Is on Augmentin at home. Has reportedly had some fevers over the last few days. Has had decreased oral intake. Has had some mild diarrhea. States he feels weak all over. Mild abdominal pain. Some swelling in both his legs. No real change in his abdominal pain.  Past Medical History  Diagnosis Date  . Diabetes mellitus   . Hyperlipidemia   . Hypertension   . GERD (gastroesophageal reflux disease)   . Coronary artery disease     a. S/P prior PTCA in the 90's;  b. 04/2007 Cath: LM nl, LAD Ca2+ prox, LCX nl, RI nl, OM nl, RCA 40-50p, 40d, EF 60%;  b. 09/2012 Echo: EF 55-60%, mild LVH, nl wall motion, Gr 1 DD, mildly dil LA.  . Right ureteral stone   . SVT (supraventricular tachycardia)     a. s/p ablation per Dr. Lovena Le 10 -69yrs ago  . OSA on CPAP     a. cpap setting of 16  . Arthritis   . Bilateral kidney stones   . Shortness of breath dyspnea   . CKD (chronic kidney disease), stage III   . Pancreatic cancer 05/17/14    Pancreas  . Cancer dx Dec 2015    pancreatic  . Allergy    Past Surgical History  Procedure Laterality Date  . Right ureteroscopic stone extraction  09-26-2010  . Circumcision and fulgeration of condyloma  06-22-2003  . Cardiac electrophysiology study and ablation  2002  . Transthoracic echocardiogram  03-03-2011    MODERATE LVH/ LVSF NORMAL / EF 60-65%/ MILDLY DILATED LEFT ATRIUMK  . Severeal ureteroscopic stone extractions    . Ureteroscopy  11/13/2011    Procedure: URETEROSCOPY;  Surgeon: Claybon Jabs, MD;  [facility];  Service: Urology;  Laterality: Right;  RIGHT URETEROSCOPY WITH HOLMIUM LASER LIHTOTRIPSY DIGITAL URETEROSCOPE  . Coronary angioplasty  1994    [place] OF THE LAD  . Cardiac catheterization  04-27-2007    CAD WITH 30% NARROWING IN THE MID-LAD/ 50 % NARROWING PROXIMAL CIRCUMFLEX/ 40% NARROWING SECOND MARGINAL BRANCH/ 40-50% PROXIMAL RCA WITH DISTAL POSTERIOR NARROWING/ NORMAL LVF  . Cardiac catheterization  2000    NON-OBSTRUCTIVE CAD  . Laparoscopic cholecystectomy  03-26-2005  . Total knee arthroplasty  07/18/2012    Procedure: TOTAL KNEE ARTHROPLASTY;  Surgeon: Johnn Hai, MD;  Location: [facility];  Service: Orthopedics;  Laterality: Right;  . Esophagogastroduodenoscopy N/A 05/12/2014    Procedure: ESOPHAGOGASTRODUODENOSCOPY (EGD);  Surgeon: Beryle Beams, MD;  Location[facility] ENDOSCOPY;  Service: Endoscopy;  Laterality: N/A;  . Ercp N/A 06/08/2014    Procedure: ENDOSCOPIC RETROGRADE CHOLANGIOPANCREATOGRAPHY (ERCP);  Surgeon: Inda Castle, MD;  Location: [facility];  Service: Gastroenterology;  Laterality: N/A;  . Esophagogastroduodenoscopy (egd) with propofol N/A 06/09/2014    Procedure: ESOPHAGOGASTRODUODENOSCOPY (EGD) WITH PROPOFOL;  Surgeon: Inda Castle, MD;  Location: [facility] ENDOSCOPY;  Service: Endoscopy;  Laterality: N/A;  . Biliary stent placement N/A 06/09/2014    Procedure: BILIARY STENT PLACEMENT;  Surgeon: Inda Castle, MD;  Location: [facility] ENDOSCOPY;  Service: Endoscopy;  Laterality: N/A;   Family History  Problem Relation Age of Onset  . Heart attack Mother   . Heart attack Brother   . Colon cancer Neg Hx   . Stomach cancer Neg Hx    History  Substance Use Topics  . Smoking status: Former Smoker -- 1.00 packs/day for 15 years    Types: Cigarettes    Quit date: 06/15/1986  . Smokeless tobacco: Never Used  . Alcohol Use: Yes     Comment: occasional    Review of Systems  Constitutional: Positive for fever, activity  change, appetite change and fatigue.  Cardiovascular: Negative for chest pain.  Gastrointestinal: Positive for abdominal pain and diarrhea. Negative for nausea and vomiting.  Genitourinary: Negative for flank pain.  Musculoskeletal: Negative for back pain.  Skin: Negative for wound.  Neurological: Positive for light-headedness.  Psychiatric/Behavioral: Negative for confusion.      Allergies  Ativan; Clindamycin/lincomycin; Codeine; and Penicillins  Home Medications   Prior to Admission medications   Medication Sig Start Date End Date Taking? Authorizing Provider  acetaminophen (TYLENOL) 325 MG tablet Take 2 tablets (650 mg total) by mouth every 6 (six) hours as needed for mild pain, moderate pain, fever or headache. 06/13/14  Yes Robbie Lis, MD  ALPRAZolam Duanne Moron) 0.5 MG tablet Take 1-2 tablets (0.5-1 mg total) by mouth 3 (three) times daily as needed for anxiety. 06/13/14  Yes Robbie Lis, MD  Alum & Mag Hydroxide-Simeth (MAGIC MOUTHWASH W/LIDOCAINE) SOLN Take 5 mLs by mouth 3 (three) times daily as needed for mouth pain. 06/13/14  Yes Robbie Lis, MD  amoxicillin-clavulanate (AUGMENTIN) 875-125 MG per tablet Take 1 tablet by mouth every 12 (twelve) hours. 06/13/14  Yes Robbie Lis, MD  atorvastatin (LIPITOR) 20 MG tablet Take 20 mg by mouth at bedtime.    Yes Historical Provider, MD  doxazosin (CARDURA) 2 MG tablet Take 2 mg by mouth daily before breakfast.    Yes Historical Provider, MD  furosemide (LASIX) 20 MG tablet Take 1 tablet (20 mg total) by mouth every morning. 06/13/14  Yes Robbie Lis, MD  hyaluronate sodium (RADIAPLEXRX) GEL Apply 1 application topically daily. Apply to abdominal area after radiation treatments daily once skin is pink or irritated 06/13/14  Yes Jodelle Gross, MD  HYDROcodone-acetaminophen (NORCO/VICODIN) 5-325 MG per tablet Take 1 tablet by mouth every 6 (six) hours as needed for moderate pain or severe pain.   Yes Historical Provider, MD  insulin  aspart (NOVOLOG) 100 UNIT/ML injection Inject 0-15 Units into the skin 3 (three) times daily with meals. Patient taking differently: Inject 0-11 Units into the skin 3 (three) times daily with meals. If blood sugar lvl is 101-150= 2 units; 151-200= 3 units; 201-250= 5 units; 251-300= 7 units; 301-350= 9 units; >350= 11 units; >400 Call MD 06/13/14  Yes Robbie Lis, MD  insulin aspart protamine- aspart (NOVOLOG MIX 70/30) (70-30) 100 UNIT/ML injection Inject 0.55 mLs (55 Units total) into the skin 2 (two) times daily with a meal. 06/13/14  Yes Robbie Lis, MD  metoprolol (LOPRESSOR) 50 MG tablet Take 50 mg by mouth 2 (two) times daily.   Yes Historical Provider, MD  Multiple Vitamins-Minerals (PRESERVISION AREDS PO) Take 1 tablet by mouth 2 (two) times daily.   Yes Historical Provider, MD  Nutritional Supplements (NUTRITIONAL DRINK PO) Take 1 Container by mouth 2 (two) times daily between meals. Sugar Free- MEDPASS   Yes Historical Provider, MD  nystatin (MYCOSTATIN)  100000 UNIT/ML suspension Take 5 mLs (500,000 Units total) by mouth 4 (four) times daily. 06/13/14  Yes Robbie Lis, MD  omeprazole (PRILOSEC) 20 MG capsule Take 20 mg by mouth 2 (two) times daily.    Yes Historical Provider, MD  potassium chloride SA (K-DUR,KLOR-CON) 20 MEQ tablet Take 20 mEq by mouth daily.   Yes Historical Provider, MD  promethazine (PHENERGAN) 25 MG tablet Take 25 mg by mouth every 6 (six) hours as needed for nausea or vomiting.   Yes Historical Provider, MD  feeding supplement, GLUCERNA SHAKE, (GLUCERNA SHAKE) LIQD Take 237 mLs by mouth 2 (two) times daily between meals. Patient not taking: Reported on 06/19/2014 06/13/14   Robbie Lis, MD  fluorouracil (EFUDEX) 5 % cream Apply 1 application topically 2 (two) times daily.    Historical Provider, MD  lidocaine-prilocaine (EMLA) cream Apply 1 application topically as needed. Patient taking differently: Apply 1 application topically as needed (port access).  05/30/14    Wyatt Portela, MD  ondansetron (ZOFRAN) 8 MG tablet Take 1 tablet (8 mg total) by mouth every 8 (eight) hours as needed for nausea or vomiting. Patient not taking: Reported on 06/19/2014 05/30/14   Wyatt Portela, MD  potassium chloride SA (K-DUR,KLOR-CON) 10 MEQ tablet Take 1 tablet (10 mEq total) by mouth daily. Takes 20 meq daily Patient not taking: Reported on 06/19/2014 06/13/14   Robbie Lis, MD   BP 101/42 mmHg  Pulse 75  Temp(Src) 99.5 F (37.5 C) (Rectal)  Resp 16  Wt 297 lb (134.718 kg)  SpO2 96% Physical Exam  Constitutional: He is oriented to person, place, and time. He appears well-developed and well-nourished.  HENT:  Head: Normocephalic and atraumatic.  Mucous membranes dry  Eyes: Pupils are equal, round, and reactive to light.  Neck: Normal range of motion. Neck supple.  Cardiovascular: Regular rhythm and normal heart sounds.   No murmur heard. Pulmonary/Chest: Effort normal and breath sounds normal.  Abdominal: Soft. He exhibits no distension and no mass. There is tenderness. There is no rebound and no guarding.  Some tenderness diffusely over abdomen.  Musculoskeletal: Normal range of motion. He exhibits edema.  Bilateral lower extremity pitting edema  Neurological: He is alert and oriented to person, place, and time. No cranial nerve deficit.  Skin: Skin is warm and dry.  Psychiatric: He has a normal mood and affect.  Nursing note and vitals reviewed.   ED Course  Procedures (including critical care time) Labs Review Labs Reviewed  CBC WITH DIFFERENTIAL - Abnormal; Notable for the following:    WBC 48.7 (*)    RBC 2.53 (*)    Hemoglobin 7.3 (*)    HCT 23.6 (*)    RDW 18.4 (*)    Neutrophils Relative % 93 (*)    Neutro Abs 45.0 (*)    Lymphocytes Relative 2 (*)    Monocytes Absolute 2.5 (*)    All other components within normal limits  COMPREHENSIVE METABOLIC PANEL - Abnormal; Notable for the following:    Sodium 130 (*)    CO2 18 (*)    Glucose,  Bld 326 (*)    BUN 81 (*)    Creatinine, Ser 1.77 (*)    Calcium 8.1 (*)    Albumin 1.7 (*)    Alkaline Phosphatase 122 (*)    GFR calc non Af Amer 37 (*)    GFR calc Af Amer 42 (*)    All other components within normal limits  I-STAT  CG4 LACTIC ACID, ED - Abnormal; Notable for the following:    Lactic Acid, Venous 5.67 (*)    All other components within normal limits  CULTURE, BLOOD (ROUTINE X 2)  CULTURE, BLOOD (ROUTINE X 2)  CULTURE, BLOOD (ROUTINE X 2)  CULTURE, BLOOD (ROUTINE X 2)  URINE CULTURE  LIPASE, BLOOD  URINALYSIS, ROUTINE W REFLEX MICROSCOPIC  PHOSPHORUS  MAGNESIUM  LIPASE, BLOOD  CORTISOL  PROTIME-INR  APTT  URINALYSIS, ROUTINE W REFLEX MICROSCOPIC  LACTIC ACID, PLASMA  LACTIC ACID, PLASMA  URINALYSIS, ROUTINE W REFLEX MICROSCOPIC  FIBRINOGEN  I-STAT TROPOININ, ED  TYPE AND SCREEN    Imaging Review Dg Chest 2 View  06/19/2014   CLINICAL DATA:  Fever, hypotension, shortness of Breath  EXAM: CHEST  2 VIEW  COMPARISON:  06/02/2014  FINDINGS: Cardiomediastinal silhouette is stable. No acute infiltrate or pulmonary edema. Tiny left pleural effusion with left basilar atelectasis.  IMPRESSION: No acute infiltrate or pulmonary edema. Tiny left pleural effusion with left basilar atelectasis but   Electronically Signed   By: Lahoma Crocker M.D.   On: 06/19/2014 13:41     EKG Interpretation   Date/Time:  Tuesday June 19 2014 13:35:40 EST Ventricular Rate:  85 PR Interval:  169 QRS Duration: 101 QT Interval:  405 QTC Calculation: 482 R Axis:   21 Text Interpretation:  Sinus rhythm Atrial premature complex Borderline  prolonged QT interval Confirmed by Alvino Chapel  MD, Ovid Curd [phone]) on  06/19/2014 3:42:43 PM      MDM   Final diagnoses:  Fever  Septic shock   CRITICAL CARE Performed by: Mackie Pai Total critical care time: 30 Critical care time was exclusive of separately billable procedures and treating other patients. Critical care was necessary  to treat or prevent imminent or life-threatening deterioration. Critical care was time spent personally by me on the following activities: development of treatment plan with patient and/or surrogate as well as nursing, discussions with consultants, evaluation of patient's response to treatment, examination of patient, obtaining history from patient or surrogate, ordering and performing treatments and interventions, ordering and review of laboratory studies, ordering and review of radiographic studies, pulse oximetry and re-evaluation of patient's condition.   Patient with elevated white count up from recent 30 up to 48. Has reportedly had fever is on antibiotics. Has had recent cholangitis. On home antibiotics. Hypotensive. Also has not been eating. Lactic acid is elevated. Giving fluid boluses. So far only had 3 L due to some difficult IV access. IV antibiotics started and culture sent. Critical care consulted. Will admit to ICU. Patient has pancreatic cancer and is without a poor prognosis overall particularly with his pancreatic cancer.     Jasper Riling. Alvino Chapel, MD 06/19/14 1544

## 2014-06-19 NOTE — ED Notes (Signed)
Attempted to call report. Receiving nurse to call the ED for report when available.

## 2014-06-19 NOTE — ED Notes (Signed)
Intensivist PA inserting central line.

## 2014-06-19 NOTE — Procedures (Signed)
Central Venous Catheter Insertion Procedure Note SABASTIEN TYLER [id number] 08-03-1940  Procedure: Insertion of Central Venous Catheter Indications: Assessment of intravascular volume, Drug and/or fluid administration and Frequent blood sampling  Procedure Details Consent: Risks of procedure as well as the alternatives and risks of each were explained to the (patient/caregiver).  Consent for procedure obtained. Time Out: Verified patient identification, verified procedure, site/side was marked, verified correct patient position, special equipment/implants available, medications/allergies/relevent history reviewed, required imaging and test results available.  Performed  Maximum sterile technique was used including antiseptics, cap, gloves, gown, hand hygiene, mask and sheet. Skin prep: Chlorhexidine; local anesthetic administered A antimicrobial bonded/coated triple lumen catheter was placed in the right internal jugular vein using the Seldinger technique.  Evaluation Blood flow good Complications: No apparent complications Patient did tolerate procedure well. Chest X-ray ordered to verify placement.  CXR: pending.  Raylene Miyamoto 06/19/2014, 4:22 PM  [place]  Wilho Sharpley J. Titus Mould, MD, [employer] Pgr: [facility]

## 2014-06-19 NOTE — H&P (Signed)
PULMONARY / CRITICAL CARE MEDICINE   Name: [name] MRN: [id number] DOB: 03-11-1941    ADMISSION DATE:  06/19/2014 CONSULTATION DATE:  1/5  REFERRING MD :  Alvino Chapel   CHIEF COMPLAINT:  Septic shock   INITIAL PRESENTATION:  74 year old male w/  known dx of pancreatic cancer and also sig h/o recurrent c-diff. F/b Shadad. Recently d/c 12/28 after being admitted for septic shock from cholangitis in setting of CBD stricture for which he was treated w/ abx and had biliary stent, then d/c to SNF for rehab.  Presents to [facility] ER from cancer center (XRT) on 1/5  w/ cc: pre-syncope, frequent diarrhea and episodes of vomiting for last 24-48 hrs. Admitted w/ working dx of septic shock, presumed abd source.    STUDIES:  CT abd 1/5>>>  SIGNIFICANT EVENTS:    HISTORY OF PRESENT ILLNESS:   74 year old male w/ known dx of pancreatic cancer. F/b Shadad. Recently d/c 12/28 after being admitted for septic shock from cholangitis in setting of CBD stricture for which he was treated w/ abx and had biliary stent, then d/c to SNF for rehab. Since admit to rehab has had poor appetite, has not done well w/ rehab given poor activity tolerance. Has been getting XRT. Reports to ER from XRT on 1/5 w/ cc: feeling light headed and like he was going to pass out. His BP was in 80s at that point so he was escorted to the ER. On arrival he endorsed ~2d h/o worsening diarrhea 5-6x a day and > 24 hours of episodic vomiting. He was started on IV hydration, empiric abx were initiated and lactic acid was obtained which was > 4 so PCCM was asked to admit.   PAST MEDICAL HISTORY :   has a past medical history of Diabetes mellitus; Hyperlipidemia; Hypertension; GERD (gastroesophageal reflux disease); Coronary artery disease; Right ureteral stone; SVT (supraventricular tachycardia); OSA on CPAP; Arthritis; Bilateral kidney stones; Shortness of breath dyspnea; CKD (chronic kidney disease), stage III; Pancreatic cancer (05/17/14); Cancer  (dx Dec 2015); and Allergy.  has past surgical history that includes RIGHT URETEROSCOPIC STONE EXTRACTION (09-26-2010); CIRCUMCISION AND FULGERATION OF CONDYLOMA (06-22-2003); Cardiac electrophysiology study and ablation (2002); transthoracic echocardiogram (03-03-2011); SEVEREAL URETEROSCOPIC STONE EXTRACTIONS; Ureteroscopy (11/13/2011); Coronary angioplasty (1994); Cardiac catheterization (04-27-2007); Cardiac catheterization (2000); Laparoscopic cholecystectomy (03-26-2005); Total knee arthroplasty (07/18/2012); Esophagogastroduodenoscopy (N/A, 05/12/2014); ERCP (N/A, 06/08/2014); Esophagogastroduodenoscopy (egd) with propofol (N/A, 06/09/2014); and biliary stent placement (N/A, 06/09/2014). Prior to Admission medications   Medication Sig Start Date End Date Taking? Authorizing Provider  acetaminophen (TYLENOL) 325 MG tablet Take 2 tablets (650 mg total) by mouth every 6 (six) hours as needed for mild pain, moderate pain, fever or headache. 06/13/14  Yes Robbie Lis, MD  ALPRAZolam Duanne Moron) 0.5 MG tablet Take 1-2 tablets (0.5-1 mg total) by mouth 3 (three) times daily as needed for anxiety. 06/13/14  Yes Robbie Lis, MD  Alum & Mag Hydroxide-Simeth (MAGIC MOUTHWASH W/LIDOCAINE) SOLN Take 5 mLs by mouth 3 (three) times daily as needed for mouth pain. 06/13/14  Yes Robbie Lis, MD  doxazosin (CARDURA) 2 MG tablet Take 2 mg by mouth daily before breakfast.    Yes Historical Provider, MD  feeding supplement, GLUCERNA SHAKE, (GLUCERNA SHAKE) LIQD Take 237 mLs by mouth 2 (two) times daily between meals. 06/13/14  Yes Robbie Lis, MD  furosemide (LASIX) 20 MG tablet Take 1 tablet (20 mg total) by mouth every morning. 06/13/14  Yes Robbie Lis, MD  hyaluronate sodium (RADIAPLEXRX) GEL Apply 1 application topically daily. Apply to abdominal area after radiation treatments daily once skin is pink or irritated 06/13/14  Yes Jodelle Gross, MD  insulin aspart (NOVOLOG) 100 UNIT/ML injection Inject 0-15 Units  into the skin 3 (three) times daily with meals. 06/13/14  Yes Robbie Lis, MD  insulin aspart protamine- aspart (NOVOLOG MIX 70/30) (70-30) 100 UNIT/ML injection Inject 0.55 mLs (55 Units total) into the skin 2 (two) times daily with a meal. 06/13/14  Yes Robbie Lis, MD  nystatin (MYCOSTATIN) 100000 UNIT/ML suspension Take 5 mLs (500,000 Units total) by mouth 4 (four) times daily. 06/13/14  Yes Robbie Lis, MD  omeprazole (PRILOSEC) 20 MG capsule Take 20 mg by mouth 2 (two) times daily.    Yes Historical Provider, MD  ondansetron (ZOFRAN) 8 MG tablet Take 1 tablet (8 mg total) by mouth every 8 (eight) hours as needed for nausea or vomiting. 05/30/14  Yes Wyatt Portela, MD  potassium chloride SA (K-DUR,KLOR-CON) 10 MEQ tablet Take 1 tablet (10 mEq total) by mouth daily. Takes 20 meq daily 06/13/14  Yes Robbie Lis, MD  amoxicillin-clavulanate (AUGMENTIN) 875-125 MG per tablet Take 1 tablet by mouth every 12 (twelve) hours. 06/13/14   Robbie Lis, MD  atorvastatin (LIPITOR) 20 MG tablet Take 20 mg by mouth at bedtime. ON HOLD    Historical Provider, MD  fluorouracil (EFUDEX) 5 % cream Apply 1 application topically 2 (two) times daily.    Historical Provider, MD  lidocaine-prilocaine (EMLA) cream Apply 1 application topically as needed. Patient taking differently: Apply 1 application topically as needed (port access).  05/30/14   Wyatt Portela, MD   Allergies  Allergen Reactions  . Ativan [Lorazepam] Anaphylaxis  . Clindamycin/Lincomycin Other (See Comments)    dysphagia  . Codeine Itching  . Penicillins Rash    As a child    FAMILY HISTORY:  indicated that his mother is deceased. He indicated that his father is deceased. He indicated that his brother is deceased.  SOCIAL HISTORY:  reports that he quit smoking about 28 years ago. His smoking use included Cigarettes. He has a 15 pack-year smoking history. He has never used smokeless tobacco. He reports that he drinks alcohol. He  reports that he does not use illicit drugs.  REVIEW OF SYSTEMS:  See Above  SUBJECTIVE:  abd pain and diarrhea. Feels a little better after IV bolus  VITAL SIGNS: Temp:  [97.8 F (36.6 C)-99.5 F (37.5 C)] 99.5 F (37.5 C) (01/05 1340) Pulse Rate:  [83-87] 83 (01/05 1351) Resp:  [20] 20 (01/05 1351) BP: (90-103)/(47-51) 103/48 mmHg (01/05 1351) SpO2:  [96 %-100 %] 96 % (01/05 1351) Weight:  [134.718 kg (297 lb)] 134.718 kg (297 lb) (01/05 1349) HEMODYNAMICS:   VENTILATOR SETTINGS:   INTAKE / OUTPUT: No intake or output data in the 24 hours ending 06/19/14 1502  PHYSICAL EXAMINATION: General:  Chronically ill appearing male, some mild distress d/t abd pain  Neuro:  Awake, oriented, no focal def, generalized weakness  HEENT:  Comunas, no JVd  Cardiovascular:  rrr Lungs:  Decreased in bases, no accessory muscle use  Abdomen:  Large, tender to touch, tympanic percussion  Musculoskeletal:  Intact  Skin:  Intact   LABS:  CBC  Recent Labs Lab 06/13/14 0420 06/19/14 1254  WBC 27.0* 48.7*  HGB 9.0* 7.3*  HCT 28.4* 23.6*  PLT 267 271   Coag's No results for input(s): APTT, INR in  the last 168 hours. BMET  Recent Labs Lab 06/13/14 0420 06/19/14 1254  NA 130* 130*  K 4.3 3.9  CL 97 100  CO2 22 18*  BUN 51* 81*  CREATININE 1.50* 1.77*  GLUCOSE 179* 326*   Electrolytes  Recent Labs Lab 06/13/14 0420 06/19/14 1254  CALCIUM 8.0* 8.1*   Sepsis Markers  Recent Labs Lab 06/19/14 1314  LATICACIDVEN 5.67*   ABG No results for input(s): PHART, PCO2ART, PO2ART in the last 168 hours. Liver Enzymes  Recent Labs Lab 06/13/14 0420 06/19/14 1254  AST 26 34  ALT 27 18  ALKPHOS 188* 122*  BILITOT 1.3* 1.1  ALBUMIN 1.6* 1.7*   Cardiac Enzymes No results for input(s): TROPONINI, PROBNP in the last 168 hours. Glucose  Recent Labs Lab 06/13/14 0729 06/13/14 1133 06/13/14 1702 06/13/14 2129 06/14/14 0719 06/14/14 1147  GLUCAP 204* 180* 162* 145* 185*  217*    Imaging No results found.   ASSESSMENT / PLAN:  PULMONARY OETT A: no acute.      Basilar atx  P:   O2 Pulse ox pulm hygiene No not intubate   CARDIOVASCULAR CVL A:  Septic shock  >presume abd source. Suspect recurrent CDI  P:  Complete fluid challenge to 30 cc/kg Central access, get SCVO2 Repeat lactic acid  Cont IVFs Ck CVP: goal 8-12  Will use volume and pressors as indicated.  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 S/p biliary stent placement 12/26.  S/P < 2 weeks XRT only.  Ileus  Protein calorie malnutrition  >suspect this is recurrent CDI as LFTs and total bili not raised.  P:   CT abd See ID section  NPO except meds  May need input from Dr Benson [place], prior [place] MD  HEMATOLOGIC A:   Anemia of critical illness.  His Hgb has drifted from 9 to 7.2. Do not see evidence of bleeding.  P:  Will hold off on [place] heparin for now and start SCDs Heme-occult stools Transfuse for hgb < 7   INFECTIOUS A:   Septic shock. Presume abd source: ddx: recurrent CDI vs recurrent cholangitis (although total bili not elevated), vs possible bowel ischemia. Also consider possibility of bacterial translocation from Gut.   P:   BCx2 1/5>>> UC 1/5>>> cdiff 1/5>> IV vanc 1/5>>> Zosyn 1/5>>> IV flagyl 1/5>>> Oral vanc 1/5>>> vanc enema 1/5>>>  For CT  ENDOCRINE A:   Hyperglycemia in setting of known IDDM  P:    Hyperglycemia protocol   NEUROLOGIC A:  No acute  P:   Supportive care    FAMILY  - Updates: goals of card discussed on admit with the patient, his wife and 4 daughters all present. Hope is to be aggressive, but have set limitations. He agrees to NO CPR or ventilation. Central access and pressors ok.  NP summary 74 year old male w/ pancreatic cancer, recurrent CDI and recent cholangitis from CBD stricture. Admitted from SNF/rehab with recurrent septic shock. Source most likely abd. Suspect that this is recurrent CDI given clinical evidence of ileus, diarrhea and vomiting. Seems less likely recurrent cholangitis as total bili ok. Will get CT scan abd, place on broad spec abx to cover abd and CDI. Needs CVL, possibly pressors. Goals of care as discussed above.    06/19/2014, 3:02 PM  STAFF NOTE: I, Merrie Roof, MD FACP have personally reviewed patient's available data, including medical history, events of note, physical examination and test results as part of my evaluation. I have discussed with resident/NP and other care providers such as pharmacist, RN and RRT. In addition, I personally evaluated patient and elicited key findings of: CLinically abdo pain, 5 stools already with prior cdiff testing pos, no RUQ pain, Bili wnl, likley cdiff colitis, r/o cholangitis, lactic almost 6 , give 30 cc/kg bolus, repeat lactic, levophed to map goal, follow lft, I discussed with pt and wife, DNR, only pressors and line, no other heroics if declines, for CT, add vanc enema with severe cdiff for now, cvp goal 12, prognosis poor overall but new Dx, assess ldh for risk ischemic bowel, if CBD normal on ct and no cholangitits rapid de escalation IV zosyn, IV vanc, cortisol, treat NONAG with bicarb The patient is critically ill with multiple organ systems failure and requires high complexity decision making for assessment and support, frequent evaluation and titration of therapies, application of advanced  monitoring technologies and extensive interpretation of multiple databases.   Critical Care Time devoted to patient care services described in this note is40 Minutes. This time reflects time of care of this signee: Merrie Roof, MD FACP. This critical care time does not reflect procedure time, or teaching time or supervisory time of PA/NP/Med student/Med Resident etc but could involve care discussion time. Rest per NP/medical resident whose note is outlined above and that I agree with   Lavon Paganini. Titus Mould, MD, [place] Pgr: [facility] 06/19/2014 4:04 PM

## 2014-06-19 NOTE — ED Notes (Signed)
Patient transported to X-ray 

## 2014-06-19 NOTE — Progress Notes (Signed)
ANTIBIOTIC CONSULT NOTE - INITIAL  Pharmacy Consult for Vancomycin and Zosyn Indication: Sepsis, intra-abdominal infection  Allergies  Allergen Reactions  . Ativan [Lorazepam] Anaphylaxis  . Clindamycin/Lincomycin Other (See Comments)    dysphagia  . Codeine Itching  . Penicillins Rash    As a child    Patient Measurements: Weight: 297 lb (134.718 kg)  Vital Signs: Temp: 99.5 F (37.5 C) (01/05 1340) Temp Source: Rectal (01/05 1340) BP: 101/42 mmHg (01/05 1502) Pulse Rate: 75 (01/05 1502) Intake/Output from previous day:   Intake/Output from this shift:    Labs:  Recent Labs  06/19/14 1254  WBC 48.7*  HGB 7.3*  PLT 271  CREATININE 1.77*   Estimated Creatinine Clearance: 58 mL/min (by C-G formula based on Cr of 1.77). No results for input(s): VANCOTROUGH, VANCOPEAK, VANCORANDOM, GENTTROUGH, GENTPEAK, GENTRANDOM, TOBRATROUGH, TOBRAPEAK, TOBRARND, AMIKACINPEAK, AMIKACINTROU, AMIKACIN in the last 72 hours.   Microbiology: Recent Results (from the past 720 hour(s))  MRSA PCR Screening     Status: None   Collection Time: 05/31/14  4:25 PM  Result Value Ref Range Status   MRSA by PCR NEGATIVE NEGATIVE Final    Comment:        The GeneXpert MRSA Assay (FDA approved for NASAL specimens only), is one component of a comprehensive MRSA colonization surveillance program. It is not intended to diagnose MRSA infection nor to guide or monitor treatment for MRSA infections.   Urine culture     Status: None   Collection Time: 05/31/14  9:15 PM  Result Value Ref Range Status   Specimen Description URINE, RANDOM  Final   Special Requests NONE  Final   Culture  Setup Time   Final    06/01/2014 01:24 Performed at [facility]   Final    4,000 COLONIES/ML Performed at [facility]    Culture   Final    INSIGNIFICANT GROWTH Performed at [facility]    Report Status 06/01/2014 FINAL  Final  Culture, blood (routine x 2)      Status: None   Collection Time: 05/31/14 10:59 PM  Result Value Ref Range Status   Specimen Description BLOOD PICC  Final   Special Requests BOTTLES DRAWN AEROBIC ONLY 3ML  Final   Culture  Setup Time   Final    06/01/2014 04:41 Performed at [place]   Final    NO GROWTH 5 DAYS Performed at [facility]    Report Status 06/07/2014 FINAL  Final  Clostridium Difficile by PCR     Status: None   Collection Time: 06/01/14  6:20 AM  Result Value Ref Range Status   C difficile by pcr NEGATIVE NEGATIVE Final    Comment: Performed at [place] History: Past Medical History  Diagnosis Date  . Diabetes mellitus   . Hyperlipidemia   . Hypertension   . GERD (gastroesophageal reflux disease)   . Coronary artery disease     a. S/P prior PTCA in the 90's;  b. 04/2007 Cath: LM nl, LAD Ca2+ prox, LCX nl, RI nl, OM nl, RCA 40-50p, 40d, EF 60%;  b. 09/2012 Echo: EF 55-60%, mild LVH, nl wall motion, Gr 1 DD, mildly dil LA.  . Right ureteral stone   . SVT (supraventricular tachycardia)     a. s/p ablation per Dr. Lovena Le 10 -68yrs ago  . OSA on CPAP     a. cpap setting  of 17  . Arthritis   . Bilateral kidney stones   . Shortness of breath dyspnea   . CKD (chronic kidney disease), stage III   . Pancreatic cancer 05/17/14    Pancreas  . Cancer dx Dec 2015    pancreatic  . Allergy     Medications:  Scheduled:  . heparin  5,000 Units Subcutaneous 3 times per day  . hydrocortisone sod succinate (SOLU-CORTEF) inj  50 mg Intravenous Q6H  . insulin aspart  0-20 Units Subcutaneous 6 times per day  . vancomycin  500 mg Oral 4 times per day   Infusions:  . sodium chloride    . metronidazole    . norepinephrine (LEVOPHED) Adult infusion    . piperacillin-tazobactam    . piperacillin-tazobactam    . [START ON 06/20/2014] piperacillin-tazobactam (ZOSYN)  IV    .  sodium bicarbonate  infusion 1000 mL    . sodium chloride    . sodium chloride      And  . sodium chloride    . [START ON 06/20/2014] vancomycin    . vancomycin     PRN:   Assessment: 74 yo male with pancreatic cancer with plans to start chemotherapy tomorrow, here for radiation presents with fever and hypotension. Pharmacy is consulted to dose vancomycin and zosyn for sepsis.  Patient has a listed allergy to penicillin but has recently tolerated augmentin during the previous admission.  Goal of Therapy:  Vancomycin trough level 15-20 mcg/ml  Zosyn dose appropriate for renal function  Plan:   Vancomycin 2500mg  IV x 1, then 1500mg  IV q24h Check trough at steady state Zosyn 3.375gm IV q8h (4hr extended infusions) Follow up renal function & cultures, clinical course  Peggyann Juba, PharmD, BCPS Pager: [phone] 06/19/2014,3:25 PM

## 2014-06-19 NOTE — ED Notes (Signed)
IV Team unable to get an IV.

## 2014-06-19 NOTE — ED Notes (Signed)
Pt was leaving radiation when stated he did not feel well.  RN at cancer center checked bp and found to be 88 over?.  RN told wife to bring patient here.  Has not been feeling well.  Was to start chemo tomorrow.

## 2014-06-19 NOTE — Progress Notes (Signed)
Pt seen, found already wearing home cpap with full face mask.  Pt asleep, appears comfortable.  No frays on cord or obvious defects noted.  Setting displayed on screen is 16cm h2o.  Humidity chamber noted full with water.

## 2014-06-19 NOTE — Progress Notes (Signed)
UR completed 

## 2014-06-19 NOTE — Progress Notes (Signed)
Patient here for radiation and wife asked if patient should be seen, he is not eating and is extremely weak. bp 88/45 pulse 100 temp 97.7. Per dr Alen Blew, patient to report to the E.R. Wife verbalized understanding.

## 2014-06-19 NOTE — ED Notes (Signed)
Intensivist PA in the room talking with family members.

## 2014-06-20 ENCOUNTER — Ambulatory Visit: Payer: Medicare Other | Admitting: Oncology

## 2014-06-20 ENCOUNTER — Ambulatory Visit: Payer: Medicare Other

## 2014-06-20 ENCOUNTER — Other Ambulatory Visit: Payer: Medicare Other

## 2014-06-20 DIAGNOSIS — E43 Unspecified severe protein-calorie malnutrition: Secondary | ICD-10-CM | POA: Diagnosis present

## 2014-06-20 LAB — GLUCOSE, CAPILLARY
GLUCOSE-CAPILLARY: 308 mg/dL — AB (ref 70–99)
GLUCOSE-CAPILLARY: 299 mg/dL — AB (ref 70–99)
GLUCOSE-CAPILLARY: 304 mg/dL — AB (ref 70–99)
GLUCOSE-CAPILLARY: 260 mg/dL — AB (ref 70–99)
GLUCOSE-CAPILLARY: 217 mg/dL — AB (ref 70–99)
Glucose-Capillary: 230 mg/dL — ABNORMAL HIGH (ref 70–99)
Glucose-Capillary: 280 mg/dL — ABNORMAL HIGH (ref 70–99)
Glucose-Capillary: 294 mg/dL — ABNORMAL HIGH (ref 70–99)
Glucose-Capillary: 316 mg/dL — ABNORMAL HIGH (ref 70–99)
Glucose-Capillary: 129 mg/dL — ABNORMAL HIGH (ref 70–99)
Glucose-Capillary: 147 mg/dL — ABNORMAL HIGH (ref 70–99)
Glucose-Capillary: 157 mg/dL — ABNORMAL HIGH (ref 70–99)
Glucose-Capillary: 182 mg/dL — ABNORMAL HIGH (ref 70–99)
Glucose-Capillary: 128 mg/dL — ABNORMAL HIGH (ref 70–99)
Glucose-Capillary: 135 mg/dL — ABNORMAL HIGH (ref 70–99)
Glucose-Capillary: 151 mg/dL — ABNORMAL HIGH (ref 70–99)
Glucose-Capillary: 164 mg/dL — ABNORMAL HIGH (ref 70–99)
Glucose-Capillary: 168 mg/dL — ABNORMAL HIGH (ref 70–99)

## 2014-06-20 LAB — COMPREHENSIVE METABOLIC PANEL
ALT: 13 U/L (ref 0–53)
AST: 22 U/L (ref 0–37)
Albumin: 1.3 g/dL — ABNORMAL LOW (ref 3.5–5.2)
Alkaline Phosphatase: 101 U/L (ref 39–117)
Anion gap: 8 (ref 5–15)
BUN: 75 mg/dL — ABNORMAL HIGH (ref 6–23)
CO2: 20 mmol/L (ref 19–32)
Calcium: 7.4 mg/dL — ABNORMAL LOW (ref 8.4–10.5)
Chloride: 104 mEq/L (ref 96–112)
Creatinine, Ser: 1.59 mg/dL — ABNORMAL HIGH (ref 0.50–1.35)
GFR calc Af Amer: 48 mL/min — ABNORMAL LOW (ref >90)
GFR calc non Af Amer: 42 mL/min — ABNORMAL LOW (ref >90)
Glucose, Bld: 310 mg/dL — ABNORMAL HIGH (ref 70–99)
Potassium: 3.2 mmol/L — ABNORMAL LOW (ref 3.5–5.1)
Sodium: 132 mmol/L — ABNORMAL LOW (ref 135–145)
Total Bilirubin: 0.9 mg/dL (ref 0.3–1.2)
Total Protein: 5.1 g/dL — ABNORMAL LOW (ref 6.0–8.3)

## 2014-06-20 LAB — CORTISOL: Cortisol, Plasma: 30 ug/dL

## 2014-06-20 LAB — CLOSTRIDIUM DIFFICILE BY PCR: Toxigenic C. Difficile by PCR: NEGATIVE

## 2014-06-20 LAB — CBC
HCT: 22 % — ABNORMAL LOW (ref 39.0–52.0)
Hemoglobin: 7 g/dL — ABNORMAL LOW (ref 13.0–17.0)
MCH: 29.4 pg (ref 26.0–34.0)
MCHC: 31.8 g/dL (ref 30.0–36.0)
MCV: 92.4 fL (ref 78.0–100.0)
Platelets: 222 10*3/uL (ref 150–400)
RBC: 2.38 MIL/uL — ABNORMAL LOW (ref 4.22–5.81)
RDW: 18.5 % — ABNORMAL HIGH (ref 11.5–15.5)
WBC: 31.6 10*3/uL — ABNORMAL HIGH (ref 4.0–10.5)

## 2014-06-20 MED ORDER — INSULIN ASPART 100 UNIT/ML ~~LOC~~ SOLN
0.0000 [IU] | Freq: Three times a day (TID) | SUBCUTANEOUS | Status: DC
  Administered 2014-06-20: 4 [IU] via SUBCUTANEOUS
  Administered 2014-06-21 (×2): 11 [IU] via SUBCUTANEOUS
  Administered 2014-06-21: 15 [IU] via SUBCUTANEOUS
  Administered 2014-06-22: 4 [IU] via SUBCUTANEOUS
  Administered 2014-06-22 (×2): 7 [IU] via SUBCUTANEOUS
  Administered 2014-06-23: 4 [IU] via SUBCUTANEOUS
  Administered 2014-06-23: 7 [IU] via SUBCUTANEOUS

## 2014-06-20 MED ORDER — CHLORHEXIDINE GLUCONATE 0.12 % MT SOLN
15.0000 mL | Freq: Two times a day (BID) | OROMUCOSAL | Status: DC
  Administered 2014-06-20 – 2014-06-25 (×8): 15 mL via OROMUCOSAL
  Filled 2014-06-20 (×13): qty 15

## 2014-06-20 MED ORDER — POTASSIUM CHLORIDE 20 MEQ/15ML (10%) PO SOLN
40.0000 meq | Freq: Once | ORAL | Status: AC
  Administered 2014-06-20: 40 meq via ORAL
  Filled 2014-06-20: qty 30

## 2014-06-20 MED ORDER — CETYLPYRIDINIUM CHLORIDE 0.05 % MT LIQD
7.0000 mL | Freq: Two times a day (BID) | OROMUCOSAL | Status: DC
  Administered 2014-06-20 – 2014-06-23 (×8): 7 mL via OROMUCOSAL

## 2014-06-20 MED ORDER — SERTRALINE HCL 25 MG PO TABS
25.0000 mg | ORAL_TABLET | Freq: Every day | ORAL | Status: DC
  Administered 2014-06-20 – 2014-06-25 (×5): 25 mg via ORAL
  Filled 2014-06-20 (×6): qty 1

## 2014-06-20 MED ORDER — INSULIN ASPART 100 UNIT/ML ~~LOC~~ SOLN
0.0000 [IU] | Freq: Every day | SUBCUTANEOUS | Status: DC
  Administered 2014-06-20: 2 [IU] via SUBCUTANEOUS

## 2014-06-20 MED ORDER — INSULIN GLARGINE 100 UNIT/ML ~~LOC~~ SOLN: 30.0000 [IU] | SUBCUTANEOUS | Status: DC

## 2014-06-20 MED ORDER — ACETAMINOPHEN 325 MG PO TABS
650.0000 mg | ORAL_TABLET | Freq: Four times a day (QID) | ORAL | Status: DC | PRN
  Administered 2014-06-20: 650 mg via ORAL
  Filled 2014-06-20 (×2): qty 2

## 2014-06-20 MED ORDER — INSULIN GLARGINE 100 UNIT/ML ~~LOC~~ SOLN
35.0000 [IU] | Freq: Once | SUBCUTANEOUS | Status: AC
  Administered 2014-06-20: 35 [IU] via SUBCUTANEOUS
  Filled 2014-06-20: qty 0.35

## 2014-06-20 MED ORDER — INSULIN GLARGINE 100 UNIT/ML ~~LOC~~ SOLN
35.0000 [IU] | SUBCUTANEOUS | Status: DC
  Filled 2014-06-20: qty 0.35

## 2014-06-20 MED ORDER — SODIUM CHLORIDE 0.9 % IV SOLN
INTRAVENOUS | Status: DC
  Administered 2014-06-20 – 2014-06-23 (×2): via INTRAVENOUS

## 2014-06-20 MED ORDER — INSULIN GLARGINE 100 UNIT/ML ~~LOC~~ SOLN
35.0000 [IU] | SUBCUTANEOUS | Status: DC
  Administered 2014-06-21: 35 [IU] via SUBCUTANEOUS
  Filled 2014-06-20: qty 0.35

## 2014-06-20 MED ORDER — POTASSIUM CHLORIDE 10 MEQ/50ML IV SOLN
10.0000 meq | INTRAVENOUS | Status: AC
  Administered 2014-06-20 (×2): 10 meq via INTRAVENOUS
  Filled 2014-06-20 (×2): qty 50

## 2014-06-20 MED ORDER — SODIUM CHLORIDE 0.9 % IV SOLN
INTRAVENOUS | Status: DC
  Administered 2014-06-20: 2.3 [IU]/h via INTRAVENOUS
  Filled 2014-06-20: qty 2.5

## 2014-06-20 MED ORDER — SODIUM CHLORIDE 0.9 % IV BOLUS (SEPSIS)
250.0000 mL | Freq: Once | INTRAVENOUS | Status: AC
  Administered 2014-06-20: 250 mL via INTRAVENOUS

## 2014-06-20 MED ORDER — INSULIN ASPART 100 UNIT/ML ~~LOC~~ SOLN
4.0000 [IU] | Freq: Three times a day (TID) | SUBCUTANEOUS | Status: DC
  Administered 2014-06-21: 4 [IU] via SUBCUTANEOUS

## 2014-06-20 NOTE — Progress Notes (Signed)
[facility] Progress Note Patient Name: [name] DOB: 17-Nov-1940 MRN: [id number]   Date of Service  06/20/2014  HPI/Events of Note    eICU Interventions  Hypokalemia -repleted      Intervention Category Intermediate Interventions: Electrolyte abnormality - evaluation and management  [name] V. 06/20/2014, 6:34 AM

## 2014-06-20 NOTE — Care Management Note (Addendum)
    Page 1 of 2   06/21/2014     2:49:37 PM CARE MANAGEMENT NOTE 06/21/2014  Patient:  LOVELACE, CERVENY   Account Number:  [id number]  Date Initiated:  06/20/2014  Documentation initiated by:  [facility]  Subjective/Objective Assessment:   adm: Septic shock     Action/Plan:   discharge planning   Anticipated DC Date:  06/25/2014   Anticipated DC Plan:  [place]  CM consult      [facility] Choice  HOME HEALTH   Choice offered to / List presented to:  C-3 Spouse           [place].   Status of service:  In process, will continue to follow Medicare Important Message given?   (If response is "NO", the following Medicare IM given date fields will be blank) Date Medicare IM given:   Medicare IM given by:   Date Additional Medicare IM given:   Additional Medicare IM given by:    Discharge Disposition:    Per UR Regulation:    If discussed at Long Length of Stay Meetings, dates discussed:    Comments:  06/21/14 Dessa Phi RN BSN NCM [phone] following for [facility].Await final orders.Confirmed w/CSW family adamantly declines SNF.   06/20/13 13:30 CM received callback and pt NOT ACCEPTED TO LTAC bc of planned XRT.  Pt's family aware and are adamant in taking pt home with home health rather than return to a SNF.  CM explained the limited time increment of HH services which would be covered by insurance and gave family a LIST OF PRIVATE DUTY AGENCIES(out of pocket expense)  to explore.  Family encouraged to be present during PT/OT eval understand the needs of the pt when he gets home.  Lauro Regulus has chosen [facility] to render [facility] services.  CM called referral to [facility] rep, Kristen.  Will continue to follow for orders.  Mariane Masters, BSN, CM [phone]. 13:00 CM spoke with CSW, Hughes Better who states family is interested in [facility] (pt is from [facility] and family was not happy with experience).  CM spoke with family, and wife  of pt, Eban Weick [phone] confirmed choice of [facility] and states she is NOT interested in [facility] and would bring him home if not accepted into [facility].  CM spoke with [facility] rep, Tommy, and asked if pt would be accepted if pt continues to receive radiation treament; according to family, XRT is plan on a daily basis through January.  CM has placed call to NP to confirm bc LTACs will NOT accept if this is the plan. Waiting for callback.   Mariane Masters, BSN, Cm [phone].

## 2014-06-20 NOTE — Progress Notes (Signed)
MD notified of Carboxyhemoglobin results. No new orders received.

## 2014-06-20 NOTE — Progress Notes (Signed)
Pt seen, already wearing home cpap with full face mask.  Pt was assisted by family member at bedside.  Pt is tolerating well at this time.  Water in humidity chamber noted at max fill line.  No frays on cord or obvious defects noted.

## 2014-06-20 NOTE — Progress Notes (Signed)
INITIAL NUTRITION ASSESSMENT  DOCUMENTATION CODES Per approved criteria  -Severe malnutrition in the context of chronic illness -Obesity Unspecified  Pt meets criteria for severe MALNUTRITION in the context of chronic illness as evidenced by 7% body weight loss in one month, PO intake < 75% for > one month.   INTERVENTION: -Recommend Glucerna Shake po BID, each supplement provides 220 kcal and 10 grams of protein w/FL diet advancement -Recommend Resource Breeze once daily once CBG improve -Recommend use of Biotene or mouth rinses to assist with dry mouth -RD to continue to monitor  NUTRITION DIAGNOSIS: Inadequate oral intake related to taste changes/dry mouth as evidenced by PO intake < 75%, 20 lb weight loss   Goal: Pt to meet >/= 90% of their estimated nutrition needs    Monitor:  Diet order, total protein/energy intake, labs, weights, CBG, education needs  Reason for Assessment: MST  74 y.o. male  Admitting Dx: <principal problem not specified>  ASSESSMENT: 74 year old male w/ known dx of pancreatic cancer and also sig h/o recurrent c-diff. F/b Shadad. Recently d/c 12/28 after being admitted for septic shock from cholangitis in setting of CBD stricture for which he was treated w/ abx and had biliary stent, then d/c to SNF for rehab. Presents to [facility] ER from cancer center (XRT) on 1/5 w/ cc: pre-syncope, frequent diarrhea and episodes of vomiting for last 24-48 hrs  -Pt reported decreased appetite since 03/2014, and unintentional wt loss of 20 lbs in one month (7% body weight loss, significant for time frame) Unable to determine usual body weight d/t fluid fluctuations -Diet recall indicates pt consuming two Glucerna shakes daily, with much encouragement from family -Pt does not consume solid foods d/t feelings of dry mouth and lack of taste; encourage use of oral rinses and mints/lemon drops to assist with dry mouth and improve taste alterations  -Has tried [employer]  during a previous admit, and enjoyed taste. Will order once CBG within goal -Had 5-6 loose stools/day pta; c.diff results pending; which also would likely contribute to poor PO intake -NPO for CT of abd -CBG elevated, Low K being repleted  Height: Ht Readings from Last 1 Encounters:  06/19/14 6\' 6"  (1.981 m)    Weight: Wt Readings from Last 1 Encounters:  06/20/14 298 lb 8.1 oz (135.4 kg)    Ideal Body Weight: 214 lb  % Ideal Body Weight: 139%  Wt Readings from Last 10 Encounters:  06/20/14 298 lb 8.1 oz (135.4 kg)  06/07/14 295 lb (133.811 kg)  05/30/14 303 lb 14.4 oz (137.848 kg)  05/10/14 298 lb (135.172 kg)  01/22/14 311 lb (141.069 kg)  01/13/14 306 lb 6.4 oz (138.982 kg)  01/09/14 312 lb (141.522 kg)  06/27/13 307 lb (139.254 kg)  11/28/12 296 lb (134.265 kg)  10/13/12 292 lb (132.45 kg)    Usual Body Weight: unable to determine; possibly 320 lb per pt's wt loss report  % Usual Body Weight:  93%  BMI:  Body mass index is 34.5 kg/(m^2).  Estimated Nutritional Needs: Kcal: 0938-1829 Protein: >/=145 gram Fluid: per MD  Skin: +3 generalized edema, +3 RUE edema, +2 LUE edema, +3 RLE and +3 LLE edema  Diet Order: Diet clear liquid  EDUCATION NEEDS: -Education needs addressed   Intake/Output Summary (Last 24 hours) at 06/20/14 1201 Last data filed at 06/20/14 1023  Gross per 24 hour  Intake 5524.08 ml  Output   1100 ml  Net 4424.08 ml    Last BM: 1/06  Labs:   Recent Labs Lab 06/19/14 1254 06/19/14 1528 06/20/14 0420  NA 130*  --  132*  K 3.9  --  3.2*  CL 100  --  104  CO2 18*  --  20  BUN 81*  --  75*  CREATININE 1.77*  --  1.59*  CALCIUM 8.1*  --  7.4*  MG  --  2.2  --   PHOS  --  5.6*  --   GLUCOSE 326*  --  310*    CBG (last 3)   Recent Labs  06/20/14 0423 06/20/14 0522 06/20/14 0625  GLUCAP 280* 260* 230*    Scheduled Meds: . antiseptic oral rinse  7 mL Mouth Rinse q12n4p  . chlorhexidine  15 mL Mouth Rinse BID  .  metronidazole  500 mg Intravenous 4 times per day  . piperacillin-tazobactam (ZOSYN)  IV  3.375 g Intravenous Q8H  . potassium chloride  40 mEq Oral Once  . vancomycin  1,500 mg Intravenous Q24H  . vancomycin  500 mg Oral 4 times per day    Continuous Infusions: . sodium chloride 10 mL/hr at 06/19/14 2100  . sodium chloride 125 mL/hr at 06/20/14 0103  . insulin (NOVOLIN-R) infusion 6.1 Units/hr (06/20/14 1023)    Past Medical History  Diagnosis Date  . Diabetes mellitus   . Hyperlipidemia   . Hypertension   . GERD (gastroesophageal reflux disease)   . Coronary artery disease     a. S/P prior PTCA in the 90's;  b. 04/2007 Cath: LM nl, LAD Ca2+ prox, LCX nl, RI nl, OM nl, RCA 40-50p, 40d, EF 60%;  b. 09/2012 Echo: EF 55-60%, mild LVH, nl wall motion, Gr 1 DD, mildly dil LA.  . Right ureteral stone   . SVT (supraventricular tachycardia)     a. s/p ablation per Dr. Lovena Le 10 -45yrs ago  . OSA on CPAP     a. cpap setting of 17  . Arthritis   . Bilateral kidney stones   . Shortness of breath dyspnea   . CKD (chronic kidney disease), stage III   . Pancreatic cancer 05/17/14    Pancreas  . Cancer dx Dec 2015    pancreatic  . Allergy     Past Surgical History  Procedure Laterality Date  . Right ureteroscopic stone extraction  09-26-2010  . Circumcision and fulgeration of condyloma  06-22-2003  . Cardiac electrophysiology study and ablation  2002  . Transthoracic echocardiogram  03-03-2011    MODERATE LVH/ LVSF NORMAL / EF 60-65%/ MILDLY DILATED LEFT ATRIUMK  . Severeal ureteroscopic stone extractions    . Ureteroscopy  11/13/2011    Procedure: URETEROSCOPY;  Surgeon: Claybon Jabs, MD;  Location: [facility];  Service: Urology;  Laterality: Right;  RIGHT URETEROSCOPY WITH HOLMIUM LASER LIHTOTRIPSY DIGITAL URETEROSCOPE  . Coronary angioplasty  1994    [place] OF THE LAD  . Cardiac catheterization  04-27-2007    CAD WITH 30% NARROWING IN THE MID-LAD/ 50 % NARROWING  PROXIMAL CIRCUMFLEX/ 40% NARROWING SECOND MARGINAL BRANCH/ 40-50% PROXIMAL RCA WITH DISTAL POSTERIOR NARROWING/ NORMAL LVF  . Cardiac catheterization  2000    NON-OBSTRUCTIVE CAD  . Laparoscopic cholecystectomy  03-26-2005  . Total knee arthroplasty  07/18/2012    Procedure: TOTAL KNEE ARTHROPLASTY;  Surgeon: Johnn Hai, MD;  Location: [facility];  Service: Orthopedics;  Laterality: Right;  . Esophagogastroduodenoscopy N/A 05/12/2014    Procedure: ESOPHAGOGASTRODUODENOSCOPY (EGD);  Surgeon: Beryle Beams, MD;  Location: [facility]  ENDOSCOPY;  Service: Endoscopy;  Laterality: N/A;  . Ercp N/A 06/08/2014    Procedure: ENDOSCOPIC RETROGRADE CHOLANGIOPANCREATOGRAPHY (ERCP);  Surgeon: Inda Castle, MD;  Location: [facility];  Service: Gastroenterology;  Laterality: N/A;  . Esophagogastroduodenoscopy (egd) with propofol N/A 06/09/2014    Procedure: ESOPHAGOGASTRODUODENOSCOPY (EGD) WITH PROPOFOL;  Surgeon: Inda Castle, MD;  Location: [facility];  Service: Endoscopy;  Laterality: N/A;  . Biliary stent placement N/A 06/09/2014    Procedure: BILIARY STENT PLACEMENT;  Surgeon: Inda Castle, MD;  Location: [facility];  Service: Endoscopy;  Laterality: N/A;    Atlee Abide MS RD LDN Clinical Dietitian [phone]

## 2014-06-20 NOTE — Progress Notes (Signed)
PULMONARY / CRITICAL CARE MEDICINE   Name: CHRLES SELLEY MRN: [id number] DOB: [birth year]    ADMISSION DATE:  06/19/2014 CONSULTATION DATE:  1/5  REFERRING MD :  Alvino Chapel   CHIEF COMPLAINT:  Septic shock   INITIAL PRESENTATION:  74 year old male w/  known dx of pancreatic cancer and also sig h/o recurrent c-diff. F/b Shadad. Recently d/c 12/28 after being admitted for septic shock from cholangitis in setting of CBD stricture for which he was treated w/ abx and had biliary stent, then d/c to SNF for rehab.  Presents to [facility] ER from cancer center (XRT) on 1/5  w/ cc: pre-syncope, frequent diarrhea and episodes of vomiting for last 24-48 hrs. Admitted w/ working dx of septic shock, presumed abd source.    STUDIES:  CT abd 1/5>>> no acute findings   SIGNIFICANT EVENTS: 1/6: off pressors, shock resolved.   SUBJECTIVE:  Looks and feels much better.    VITAL SIGNS: Temp:  [95.5 F (35.3 C)-99.5 F (37.5 C)] 96.1 F (35.6 C) (01/06 1000) Pulse Rate:  [65-87] 71 (01/06 1000) Resp:  [14-21] 15 (01/06 1000) BP: (84-151)/(37-60) 126/50 mmHg (01/06 1000) SpO2:  [94 %-100 %] 99 % (01/06 1000) Weight:  [131.9 kg (290 lb 12.6 oz)-135.4 kg (298 lb 8.1 oz)] 135.4 kg (298 lb 8.1 oz) (01/06 0500) HEMODYNAMICS: CVP:  [6 mmHg-11 mmHg] 7 mmHg VENTILATOR SETTINGS:   INTAKE / OUTPUT:  Intake/Output Summary (Last 24 hours) at 06/20/14 1051 Last data filed at 06/20/14 1023  Gross per 24 hour  Intake 5524.08 ml  Output   1100 ml  Net 4424.08 ml    PHYSICAL EXAMINATION: General:  Chronically ill appearing male, no distress.  Neuro:  Awake, oriented, no focal def, generalized weakness has improved  HEENT:  [place], no JVd  Cardiovascular:  rrr Lungs:  Decreased in bases, no accessory muscle use  Abdomen:  Large, tender to touch, tympanic percussion but improved  Musculoskeletal:  Intact  Skin:  Intact   LABS:  CBC  Recent Labs Lab 06/19/14 1254 06/20/14 0420  WBC 48.7* 31.6*  HGB 7.3* 7.0*   HCT 23.6* 22.0*  PLT 271 222   Coag's  Recent Labs Lab 06/19/14 1528  APTT 29  INR 1.29   BMET  Recent Labs Lab 06/19/14 1254 06/20/14 0420  NA 130* 132*  K 3.9 3.2*  CL 100 104  CO2 18* 20  BUN 81* 75*  CREATININE 1.77* 1.59*  GLUCOSE 326* 310*   Electrolytes  Recent Labs Lab 06/19/14 1254 06/19/14 1528 06/20/14 0420  CALCIUM 8.1*  --  7.4*  MG  --  2.2  --   PHOS  --  5.6*  --    Sepsis Markers  Recent Labs Lab 06/19/14 1314 06/19/14 1528 06/19/14 1807  LATICACIDVEN 5.67* 4.8* 3.0*   ABG No results for input(s): PHART, PCO2ART, PO2ART in the last 168 hours. Liver Enzymes  Recent Labs Lab 06/19/14 1254 06/20/14 0420  AST 34 22  ALT 18 13  ALKPHOS 122* 101  BILITOT 1.1 0.9  ALBUMIN 1.7* 1.3*   Cardiac Enzymes No results for input(s): TROPONINI, PROBNP in the last 168 hours. Glucose  Recent Labs Lab 06/20/14 0110 06/20/14 0213 06/20/14 0311 06/20/14 0423 06/20/14 0522 06/20/14 0625  GLUCAP 294* 299* 304* 280* 260* 230*    Imaging Ct Abdomen Pelvis Wo Contrast  06/19/2014   CLINICAL DATA:  Progressive weakness with fever, history of pancreatic carcinoma and hypotension  EXAM: CT ABDOMEN AND PELVIS WITHOUT  CONTRAST  TECHNIQUE: Multidetector CT imaging of the abdomen and pelvis was performed following the standard protocol without IV contrast.  COMPARISON:  05/31/2014  FINDINGS: Lung bases demonstrate bibasilar atelectatic changes and small pleural effusions.  The liver, spleen, adrenal glands and pancreas are stable in appearance. A persistent large pancreatic head mass is noted with impression upon the duodenum and common bile duct. A new common bile duct stent is seen. No biliary ductal dilatation is noted. Kidneys are well visualized bilaterally without obstructive changes. Some renal calculi are noted which are nonobstructive in nature.  There remains some free fluid within the abdomen similar to that seen on the prior exam.  The bladder  is well distended. Some dependent density is noted likely related to contrast excretion. Prostatic calcifications are seen. No significant lymphadenopathy is noted the previously seen peripancreatic lymph node is involved by the pancreatic mass and less discrete on the current exam. No new significant adenopathy is noted. Degenerative changes of the lumbar spine are noted. Aortoiliac calcifications are seen.  IMPRESSION: Stable pancreatic head mass with new common bile duct stent.  The remainder of the exam is stable from the prior study.   Electronically Signed   By: Inez Catalina M.D.   On: 06/19/2014 17:27   Dg Chest 2 View  06/19/2014   CLINICAL DATA:  Fever, hypotension, shortness of Breath  EXAM: CHEST  2 VIEW  COMPARISON:  06/02/2014  FINDINGS: Cardiomediastinal silhouette is stable. No acute infiltrate or pulmonary edema. Tiny left pleural effusion with left basilar atelectasis.  IMPRESSION: No acute infiltrate or pulmonary edema. Tiny left pleural effusion with left basilar atelectasis but   Electronically Signed   By: Lahoma Crocker M.D.   On: 06/19/2014 13:41   Dg Chest Port 1 View  06/19/2014   CLINICAL DATA:  74 year old male with septic shock. Interval central line placement.  EXAM: PORTABLE CHEST - 1 VIEW  COMPARISON:  Prior chest x-ray earlier today at 13:19 p.m.  FINDINGS: Interval placement of a right IJ approach central venous catheter. The tip of the catheter overlies the distal SVC. No evidence of a pneumothorax or new pleural effusion. The mediastinum appears slightly widened, this is likely secondary to difference in technique. Mild vascular congestion. Low inspiratory volumes. No new airspace consolidation. No acute osseous abnormality.  IMPRESSION: 1. Interval placement of a right IJ central venous catheter. Catheter tip overlies the superior cavoatrial junction. No evidence of pneumothorax or other complicating feature. 2. Developing mild pulmonary vascular congestion.   Electronically  Signed   By: Jacqulynn Cadet M.D.   On: 06/19/2014 17:06     ASSESSMENT / PLAN:  PULMONARY OETT A:  OSA  P:   O2 Pulse ox pulm hygiene Nocturnal CPAP  Do not intubate   CARDIOVASCULAR CVL A:  Septic shock (resolved)  >presume abd source. Source c/w CDI but was PCR was negative. Hemodynamics now improved.  P:  Cont IVFs Cont broad spec abx He is limited resuscitation so will use pressors but would not be candidate for CPR or ACLS interventions  See ID section   RENAL A:   Acute on chronic renal failure (BL scr 1.5 range) --> improved  NAG metabolic acidosis Suspect bicarb loss from diarrhea -->resolved Lactic acidosis in setting of shock -->improved Mild hyponatremia -->improved  hypokalemia P:   Replace K F/u am chemistries Encourage pos Decrease IVFs  GASTROINTESTINAL A:   H/o recurrent CDI Pancreatic cancer w/  CBD stricture. S/p biliary stent placement 12/26.  S/P < 2 weeks XRT only. (mass stable by CT and stent functioning) Ileus  Protein calorie malnutrition  P:   See ID section  Adv diet   HEMATOLOGIC A:   Anemia of critical illness.  Do not see evidence of bleeding.  P:  Will hold off on [place] heparin for now and start SCDs Heme-occult stools Transfuse for hgb < 7   INFECTIOUS A:   Septic shock. Presume abd source: thought for sure this would be recurrent CDI but PCR was negative. Spoke to ID. Overwhelming clinical evidence supports CDI. They suggest treating  P:   BCx2 1/5>>> UC 1/5>>> cdiff 1/5>> neg IV vanc 1/5>>> Zosyn 1/5>>> IV flagyl 1/5>>> Oral vanc 1/5>>> vanc enema 1/5>>>1/6  ENDOCRINE A:   Hyperglycemia in setting of known IDDM  P:   Hyperglycemia protocol -->hope to transition off gtt soon   NEUROLOGIC A:  No acute  P:   Supportive care    FAMILY  - Updates: goals of card discussed on admit with the patient, his wife and 4 daughters all present. Hope is to be aggressive, but have set limitations. He agrees to NO CPR  or ventilation. Central access and pressors ok.    NP summary 74 year old male w/ pancreatic cancer, recurrent CDI and recent cholangitis from CBD stricture. Admitted from SNF/rehab with recurrent septic shock likely recurrent CDI. Shock resolved. PCR was neg but spoke to ID who felt based on presentation and response to therapy treating for full course of CDI is best course of action. Will keep in SDU setting and initiate PT. OK to go to XRT.   Erick Colace ACNP-BC [place] Pager # [phone] OR # [phone] if no answer 06/20/2014, 10:51 AM  Attending Note:  I have examined patient, reviewed labs, studies and notes. I have discussed the case with Jerrye Bushy and I agree with the data and plans as amended above. He is no longer on pressors this am. On my exam he is more awake and interactive than reported 1/5. C diff PCR was negative, but clinical hx is VERY suggestive. Appreciate ID input. We will plan to continue to treat C diff colitis. Goal wean other abx quickly. Independent CC time 35 minutes.   Baltazar Apo, MD, PhD 06/20/2014, 1:11 PM [facility] [phone] or if no answer [phone]

## 2014-06-20 NOTE — Progress Notes (Signed)
Educated pt and pt's family about cdiff precautions multiple times today.  Family is noncompliant at times.  Education reiterated and the importance of precautions/ hand hygiene reeducated.

## 2014-06-21 ENCOUNTER — Ambulatory Visit
Admit: 2014-06-21 | Discharge: 2014-06-21 | Disposition: A | Discharge status: Home / Self Care | Payer: Medicare Other | Attending: Radiation Oncology | Admitting: Radiation Oncology

## 2014-06-21 ENCOUNTER — Ambulatory Visit: Payer: Medicare Other

## 2014-06-21 ENCOUNTER — Telehealth: Payer: Self-pay | Admitting: *Deleted

## 2014-06-21 DIAGNOSIS — C250 Malignant neoplasm of head of pancreas: Secondary | ICD-10-CM

## 2014-06-21 DIAGNOSIS — A047 Enterocolitis due to Clostridium difficile: Secondary | ICD-10-CM

## 2014-06-21 LAB — COMPREHENSIVE METABOLIC PANEL
ALT: 12 U/L (ref 0–53)
AST: 19 U/L (ref 0–37)
Albumin: 1.3 g/dL — ABNORMAL LOW (ref 3.5–5.2)
Alkaline Phosphatase: 94 U/L (ref 39–117)
Anion gap: 10 (ref 5–15)
BILIRUBIN TOTAL: 0.9 mg/dL (ref 0.3–1.2)
BUN: 80 mg/dL — ABNORMAL HIGH (ref 6–23)
CALCIUM: 7.4 mg/dL — AB (ref 8.4–10.5)
CHLORIDE: 109 meq/L (ref 96–112)
CO2: 18 mmol/L — AB (ref 19–32)
Creatinine, Ser: 2.01 mg/dL — ABNORMAL HIGH (ref 0.50–1.35)
GFR, EST AFRICAN AMERICAN: 36 mL/min — AB (ref >90)
GFR, EST NON AFRICAN AMERICAN: 31 mL/min — AB (ref >90)
GLUCOSE: 287 mg/dL — AB (ref 70–99)
Potassium: 3.6 mmol/L (ref 3.5–5.1)
SODIUM: 137 mmol/L (ref 135–145)
Total Protein: 4.9 g/dL — ABNORMAL LOW (ref 6.0–8.3)

## 2014-06-21 LAB — CBC
HEMATOCRIT: 22.5 % — AB (ref 39.0–52.0)
HEMOGLOBIN: 7.2 g/dL — AB (ref 13.0–17.0)
MCH: 30 pg (ref 26.0–34.0)
MCHC: 32 g/dL (ref 30.0–36.0)
MCV: 93.8 fL (ref 78.0–100.0)
Platelets: 225 10*3/uL (ref 150–400)
RBC: 2.4 MIL/uL — ABNORMAL LOW (ref 4.22–5.81)
RDW: 19.1 % — ABNORMAL HIGH (ref 11.5–15.5)
WBC: 32.2 10*3/uL — ABNORMAL HIGH (ref 4.0–10.5)

## 2014-06-21 LAB — GLUCOSE, CAPILLARY
GLUCOSE-CAPILLARY: 274 mg/dL — AB (ref 70–99)
GLUCOSE-CAPILLARY: 307 mg/dL — AB (ref 70–99)
Glucose-Capillary: 278 mg/dL — ABNORMAL HIGH (ref 70–99)

## 2014-06-21 LAB — URINE CULTURE
COLONY COUNT: NO GROWTH
CULTURE: NO GROWTH

## 2014-06-21 MED ORDER — GLUCERNA SHAKE PO LIQD
237.0000 mL | Freq: Two times a day (BID) | ORAL | Status: DC
  Administered 2014-06-21 – 2014-06-22 (×3): 237 mL via ORAL
  Filled 2014-06-21 (×4): qty 237

## 2014-06-21 MED ORDER — BOOST / RESOURCE BREEZE PO LIQD
1.0000 | ORAL | Status: DC
  Administered 2014-06-21 – 2014-06-24 (×3): 1 via ORAL

## 2014-06-21 MED ORDER — MAGIC MOUTHWASH W/LIDOCAINE
15.0000 mL | Freq: Four times a day (QID) | ORAL | Status: DC
  Administered 2014-06-21 – 2014-06-22 (×7): 15 mL via ORAL
  Filled 2014-06-21 (×17): qty 15

## 2014-06-21 MED ORDER — INSULIN ASPART 100 UNIT/ML ~~LOC~~ SOLN
8.0000 [IU] | Freq: Three times a day (TID) | SUBCUTANEOUS | Status: DC
  Administered 2014-06-21 – 2014-06-22 (×3): 8 [IU] via SUBCUTANEOUS

## 2014-06-21 MED ORDER — HYDROCODONE-ACETAMINOPHEN 5-325 MG PO TABS
1.0000 | ORAL_TABLET | Freq: Four times a day (QID) | ORAL | Status: DC | PRN
  Administered 2014-06-22: 1 via ORAL
  Filled 2014-06-21: qty 1

## 2014-06-21 NOTE — Plan of Care (Signed)
Problem: Phase I Progression Outcomes Goal: Initial discharge plan identified Outcome: Completed/Met Date Met:  06/21/14 Home with [facility]

## 2014-06-21 NOTE — Telephone Encounter (Signed)
Called the floor [phone] spoke with Hoyle Sauer, RN, verified with her that patient can   be treated with radiation today, had received call from Earleen Newport that she had a call from MD and said  Patient could be treated today, per Hoyle Sauer that is correct, will call [place] #4 and have patient added back on for today,not sure what time though 10:54 AM

## 2014-06-21 NOTE — Clinical Documentation Improvement (Signed)
"  Severe protein calorie malnutrition" documented 06/20/2014 by registered dietician.  Current MD progress note 06/21/14 states "protein calorie malnutrition".  If you agree with the severity of protein calorie malnutrition as documented by the Registered Dietician, please document in your progress note and carry over to the discharge summary.    Thank you, Mateo Flow, RN [phone] Clinical Documentation Specialist

## 2014-06-21 NOTE — Progress Notes (Signed)
Clinical Social Work Department BRIEF PSYCHOSOCIAL ASSESSMENT 06/21/2014  Patient:  [name], [name]     Account Number:  [id number]     Admit date:  06/19/2014  Clinical Social Worker:  Maryln Manuel  Date/Time:  06/21/2014 03:11 PM  Referred by:  Physician  Date Referred:  06/21/2014 Referred for  SNF Placement   Other Referral:   Interview type:  Patient Other interview type:   and patient wife, Tonia at bedside    PSYCHOSOCIAL DATA Living Status:  [place] Admitted from facility:  [facility] Level of care:  [place] Primary support name:  Tonia Stegenga/wife/[phone] Primary support relationship to patient:  SPOUSE Degree of support available:   strong    CURRENT CONCERNS Current Concerns  Post-Acute Placement   Other Concerns:    SOCIAL WORK ASSESSMENT / PLAN CSW received referral that pt admitted from [facility].    Per chart review, RNCM explored options of LTAC, but pt not accepted to [facility] because of planned radiation. RNCM notified pt family and pt family expressed that they are adamant about taking pt home with home health.      CSW met with pt and pt wife at bedside. CSW introduced self and explained role. CSW discussed that CSW reviewed chart and noted that pt was not appropriate for LTAC and pt family interested in taking pt home. CSW discussed that CSW can explore SNF options if pt and pt wife interested. Pt wife stated that plan is to return home and declined CSW exploring other options for SNF. Pt wife confirmed that she was present during physical therapy today and feels comfortable taking pt home with family support. CSW expressed understanding and discussed that RNCM will continue to follow to assist with discharge to home.    No further social work needs identified at this time.    CSW signing off.   Assessment/plan status:  No Further Intervention Required Other assessment/ plan:   Information/referral to  community resources:   RNCM made aware that pt family has officially decided upon SNF and agreeable to home health.    PATIENT'S/FAMILY'S RESPONSE TO PLAN OF CARE: Pt alert and oriented x 4. Support provided as pt wife discussed that pt has been in and out of the hospital since before thanksgiving. Pt and pt wife adamant about pt returning home, but appreciative of CSW visit.    Alison Murray, MSW, [place] [facility] [phone]

## 2014-06-21 NOTE — Evaluation (Signed)
Physical Therapy Evaluation Patient Details Name: [name] MRN: [id number] DOB: Nov 21, 1940 Today's Date: 06/21/2014   History of Present Illness  74 yo male admitted with septic shock from SNF. Hx of HTN, DM, HLD, pancreatic cancer  Clinical Impression  On eval, pt required Mod assist +2 for bed mobility and Max assist +2 for attempt at standing-pt unable to. Family meeting with physician(s) immediately after therapy session. Note pt/family possibly planning to return home after hospital stay.     Follow Up Recommendations SNF;LTACH;Supervision/Assistance - 24 hour (unless family decides they can manage with pt at home )    Equipment Recommendations  Wheelchair (measurements PT);Wheelchair cushion (measurements PT);Hospital bed    Recommendations for Other Services OT consult     Precautions / Restrictions Precautions Precautions: Fall Restrictions Weight Bearing Restrictions: No      Mobility  Bed Mobility Overal bed mobility: Needs Assistance Bed Mobility: Supine to Sit;Sit to Supine     Supine to sit: Mod assist;+2 for physical assistance;+2 for safety/equipment;HOB elevated Sit to supine: Mod assist;+2 for physical assistance;+2 for safety/equipment   General bed mobility comments: Assist for trunk and bil LEs. Increased time.   Transfers Overall transfer level: Needs assistance Equipment used: Rolling walker (2 wheeled) Transfers: Sit to/from Stand Sit to Stand: From elevated surface;Max assist;+2 physical assistance;+2 safety/equipment         General transfer comment: Attempted x 1 to stand-pt only able to clear bottom, could not power up.   Ambulation/Gait             General Gait Details: Nt-unable at time of eval  Stairs            Wheelchair Mobility    Modified Rankin (Stroke Patients Only)       Balance   Sitting-balance support: Bilateral upper extremity supported;Feet supported Sitting balance-Leahy Scale: Fair                                        Pertinent Vitals/Pain Pain Assessment: Faces Faces Pain Scale: Hurts even more Pain Location: R groin area Pain Descriptors / Indicators: Sore Pain Intervention(s): Monitored during session    Home Living Family/patient expects to be discharged to:: Unsure Living Arrangements: Spouse/significant other Available Help at Discharge: Family;Available 24 hours/day Type of Home: House Home Access: Level entry     Home Layout: One level Home Equipment: Walker - 2 wheels;Cane - single point;Wheelchair - [employer];Tub bench      Prior Function Level of Independence: Needs assistance   Gait / Transfers Assistance Needed: participating in therapy at SNF           Hand Dominance        Extremity/Trunk Assessment   Upper Extremity Assessment: Defer to OT evaluation           Lower Extremity Assessment: RLE deficits/detail;LLE deficits/detail RLE Deficits / Details: hip flex 3/5, hip abd/add 3/5, moves ankle well LLE Deficits / Details: hip flex 2/5, hip abd/add 2/5, moves ankle well  Cervical / Trunk Assessment: Normal  Communication   Communication: No difficulties  Cognition Arousal/Alertness: Awake/alert Behavior During Therapy: WFL for tasks assessed/performed Overall Cognitive Status: Within Functional Limits for tasks assessed                      General Comments      Exercises General Exercises - Lower Extremity Ankle  Circles/Pumps: AROM;Both;10 reps;Supine Heel Slides: AROM;Both;5 reps;Supine      Assessment/Plan    PT Assessment Patient needs continued PT services  PT Diagnosis Difficulty walking;Generalized weakness;Acute pain   PT Problem List Decreased strength;Decreased range of motion;Decreased activity tolerance;Decreased balance;Decreased mobility;Obesity;Decreased knowledge of use of DME;Pain  PT Treatment Interventions DME instruction;Gait training;Functional mobility  training;Therapeutic activities;Therapeutic exercise;Patient/family education;Balance training   PT Goals (Current goals can be found in the Care Plan section) Acute Rehab PT Goals Patient Stated Goal: none stated-family may be leaning towards home PT Goal Formulation: With patient/family Time For Goal Achievement: 07/05/14 Potential to Achieve Goals: Fair    Frequency Min 3X/week   Barriers to discharge        Co-evaluation               End of Session   Activity Tolerance: Patient limited by fatigue;Patient limited by pain Patient left: in bed;with call bell/phone within reach;with family/visitor present           Time: 1216-2446 PT Time Calculation (min) (ACUTE ONLY): 24 min   Charges:   PT Evaluation $Initial PT Evaluation Tier I: 1 Procedure PT Treatments $Therapeutic Activity: 8-22 mins   PT G Codes:        Weston Anna, MPT Pager: [phone]

## 2014-06-21 NOTE — Evaluation (Signed)
Occupational Therapy Evaluation Patient Details Name: [name] MRN: [id number] DOB: 06/19/1940 Today's Date: 06/21/2014    History of Present Illness 74 yo male admitted with septic shock from SNF.  Was d/c'd from hospital on 06/11/14.   Hx of HTN, DM, HLD, pancreatic cancer   Clinical Impression   Pt was admitted for septic shock.  He was recently discharged to SNF for rehab.  Pt will benefit from skilled OT to focus on toilet transfers and mobility related to ADLs to maximize pt's participation and make these easier for family.  Pt is very motivated.  Goals in acute are for mod A x 2.  Currently, he was unable to stand with max A x 2, but he did lift buttocks off of high bed.     Follow Up Recommendations  Supervision/Assistance - 24 hour;SNF (vs)    Equipment Recommendations  None recommended by OT    Recommendations for Other Services       Precautions / Restrictions Precautions Precautions: Fall Restrictions Weight Bearing Restrictions: No      Mobility Bed Mobility Overal bed mobility: Needs Assistance Bed Mobility: Supine to Sit;Sit to Supine     Supine to sit: Mod assist;+2 for physical assistance Sit to supine: Mod assist;+2 for physical assistance   General bed mobility comments: assist for trunk to sit up; assist for legs to get back to supine  Transfers Overall transfer level: Needs assistance Equipment used: Rolling walker (2 wheeled) Transfers: Sit to/from Stand Sit to Stand: From elevated surface;Max assist;+2 physical assistance;+2 safety/equipment         General transfer comment: attempted to stand with max A x 2:  only able to clear buttocks    Balance   Sitting-balance support: Bilateral upper extremity supported;Feet supported Sitting balance-Leahy Scale: Fair                                      ADL Overall ADL's : Needs assistance/impaired                                       General ADL Comments:  Pt is able to complete UB adls and grooming with set up.  He was unable to stand with A x 2--needs to roll for clothing management.       Vision                     Perception     Praxis      Pertinent Vitals/Pain Pain Assessment: Faces Faces Pain Scale: Hurts even more Pain Location: R groin Pain Descriptors / Indicators: Sore Pain Intervention(s): Monitored during session;Limited activity within patient's tolerance;Repositioned     Hand Dominance     Extremity/Trunk Assessment Upper Extremity Assessment Upper Extremity Assessment: Overall WFL for tasks assessed, at least 4/5      Cervical / Trunk Assessment Cervical / Trunk Assessment: Normal   Communication Communication Communication: No difficulties   Cognition Arousal/Alertness: Awake/alert Behavior During Therapy: WFL for tasks assessed/performed Overall Cognitive Status: Within Functional Limits for tasks assessed                     General Comments       Exercises       Shoulder Instructions      Home Living Family/patient  expects to be discharged to:: Unsure Living Arrangements: Spouse/significant other Available Help at Discharge: Family;Available 24 hours/day Type of Home: House Home Access: Level entry     Home Layout: One level               Home Equipment: Walker - 2 wheels;Cane - single point;Wheelchair - [employer];Tub bench          Prior Functioning/Environment Level of Independence: Needs assistance  Gait / Transfers Assistance Needed: participating in therapy at SNF          OT Diagnosis: Generalized weakness   OT Problem List: Decreased strength;Decreased activity tolerance;Impaired balance (sitting and/or standing);Decreased knowledge of use of DME or AE   OT Treatment/Interventions: Self-care/ADL training;Energy conservation;Therapeutic activities;Patient/family education;Balance training;DME and/or AE instruction;Therapeutic exercise     OT Goals(Current goals can be found in the care plan section) Acute Rehab OT Goals Patient Stated Goal: none stated-family may be leaning towards home OT Goal Formulation: With patient Time For Goal Achievement: 07/05/14 Potential to Achieve Goals: Good ADL Goals Pt Will Transfer to Toilet: with +2 assist;with mod assist;stand pivot transfer;bedside commode Additional ADL Goal #1: pt will go from sit to stand from elevated surface with mod A  x 2 and maintain for 2 minutes with min A for adls. Additional ADL Goal #2: Pt will initiate rest breaks for energy conservation without cues  OT Frequency: Min 2X/week   Barriers to D/C:            Co-evaluation PT/OT/SLP Co-Evaluation/Treatment: Yes Reason for Co-Treatment: Complexity of the patient's impairments (multi-system involvement);For patient/therapist safety PT goals addressed during session: Mobility/safety with mobility OT goals addressed during session: ADL's and self-care      End of Session    Activity Tolerance: Patient limited by fatigue Patient left: in bed;with call bell/phone within reach;with family/visitor present   Time: 1126-1150 OT Time Calculation (min): 24 min Charges:  OT General Charges $OT Visit: 1 Procedure OT Evaluation $Initial OT Evaluation Tier I: 1 Procedure G-Codes:    Jasani Dolney June 29, 2014, 12:24 PM   Lesle Chris, OTR/L [phone] 06-29-2014

## 2014-06-21 NOTE — Progress Notes (Signed)
PULMONARY / CRITICAL CARE MEDICINE   Name: [name] MRN: [id number] DOB: 02-26-1941    ADMISSION DATE:  06/19/2014 CONSULTATION DATE:  1/5  REFERRING MD :  Alvino Chapel   CHIEF COMPLAINT:  Septic shock   INITIAL PRESENTATION:  73 year old male w/  known dx of pancreatic cancer and also sig h/o recurrent c-diff. F/b Shadad. Recently d/c 12/28 after being admitted for septic shock from cholangitis in setting of CBD stricture for which he was treated w/ abx and had biliary stent, then d/c to SNF for rehab.  Presents to [facility] ER from cancer center (XRT) on 1/5  w/ cc: pre-syncope, frequent diarrhea and episodes of vomiting for last 24-48 hrs. Admitted w/ working dx of septic shock, presumed abd source.    STUDIES:  CT abd 1/5>>> no acute findings   SIGNIFICANT EVENTS: 1/6: off pressors, shock resolved.   SUBJECTIVE:  Wore CPAP overnight Diarrhea appears to be decreasing  VITAL SIGNS: Temp:  [95.5 F (35.3 C)-98.1 F (36.7 C)] 97.6 F (36.4 C) (01/07 0400) Pulse Rate:  [64-78] 76 (01/07 0400) Resp:  [11-21] 18 (01/07 0400) BP: (93-140)/(37-73) 122/47 mmHg (01/07 0400) SpO2:  [94 %-100 %] 100 % (01/07 0400) Weight:  [137.1 kg (302 lb 4 oz)] 137.1 kg (302 lb 4 oz) (01/07 0500) HEMODYNAMICS: CVP:  [7 mmHg] 7 mmHg VENTILATOR SETTINGS:   INTAKE / OUTPUT:  Intake/Output Summary (Last 24 hours) at 06/21/14 2423 Last data filed at 06/21/14 0404  Gross per 24 hour  Intake 3327.88 ml  Output    625 ml  Net 2702.88 ml    PHYSICAL EXAMINATION: General:  Chronically ill appearing male, no distress.  Neuro:  Awake, oriented, no focal def, generalized weakness has improved  HEENT:  [place], no JVd  Cardiovascular:  rrr Lungs:  Decreased in bases, no accessory muscle use  Abdomen:  Large, tender to touch, tympanic percussion but improved  Musculoskeletal:  Intact  Skin:  Intact   LABS:  CBC  Recent Labs Lab 06/19/14 1254 06/20/14 0420 06/21/14 0500  WBC 48.7* 31.6* 32.2*  HGB  7.3* 7.0* 7.2*  HCT 23.6* 22.0* 22.5*  PLT 271 222 225   Coag's  Recent Labs Lab 06/19/14 1528  APTT 29  INR 1.29   BMET  Recent Labs Lab 06/19/14 1254 06/20/14 0420 06/21/14 0500  NA 130* 132* 137  K 3.9 3.2* 3.6  CL 100 104 109  CO2 18* 20 18*  BUN 81* 75* 80*  CREATININE 1.77* 1.59* 2.01*  GLUCOSE 326* 310* 287*   Electrolytes  Recent Labs Lab 06/19/14 1254 06/19/14 1528 06/20/14 0420 06/21/14 0500  CALCIUM 8.1*  --  7.4* 7.4*  MG  --  2.2  --   --   PHOS  --  5.6*  --   --    Sepsis Markers  Recent Labs Lab 06/19/14 1314 06/19/14 1528 06/19/14 1807  LATICACIDVEN 5.67* 4.8* 3.0*   ABG No results for input(s): PHART, PCO2ART, PO2ART in the last 168 hours. Liver Enzymes  Recent Labs Lab 06/19/14 1254 06/20/14 0420 06/21/14 0500  AST 34 22 19  ALT 18 13 12   ALKPHOS 122* 101 94  BILITOT 1.1 0.9 0.9  ALBUMIN 1.7* 1.3* 1.3*   Cardiac Enzymes No results for input(s): TROPONINI, PROBNP in the last 168 hours. Glucose  Recent Labs Lab 06/20/14 1226 06/20/14 1416 06/20/14 1522 06/20/14 1625 06/20/14 1720 06/20/14 2138  GLUCAP 128* 135* 151* 168* 164* 217*    Imaging No results  found.   ASSESSMENT / PLAN:  PULMONARY OETT A:  OSA  P:   O2 pulm hygiene Nocturnal CPAP  Do not intubate   CARDIOVASCULAR CVL A:  Septic shock (resolved)  >presume abd source. Source c/w C diff but was PCR was negative. Hemodynamics now improved.  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 S/p biliary stent placement 12/26.  S/P < 2 weeks XRT only. (mass stable by CT and stent functioning) Ileus  Protein calorie malnutrition  P:   See ID section  Adv diet as able Note diarrhea may also be due to his underlying disease. Prognosis w regard to his pancreatic cancer has not been addressed with him to date according to his family. Will plan to discuss status and goals for care. Home with hospice would seem to be a reasonable and appropriate goal at this point. Family planning to meet with P Babcock this afternoon 1/7.   HEMATOLOGIC A:   Anemia of critical illness.  Do not see evidence of bleeding.  P:  Will hold off on Bon[place]el heparin for now, use SCDs Heme-occult stools Transfuse for hgb < 7   INFECTIOUS A:   Septic shock. Presume abd source: Clinically consistent with C diff colitis but PCR negative. ID agrees with treating C diff.  P:   BCx2 1/5>>> UC 1/5>>> negative cdiff 1/5>> neg IV vanc 1/5>>> 1/7 Zosyn 1/5>>> 1/7 IV flagyl 1/5>>> Oral vanc 1/5>>> vanc enema 1/5>>>1/6  Will d/c IV vanco and zosyn, follow on C diff regimen Low threshold to add back vanco enemas ? Long term solution here if he remains clinically infected, ? Consider stool transplantation. Could discuss this with GI, Dr Deatra Ina  ENDOCRINE A:   Hyperglycemia in setting of known IDDM  P:   Hyperglycemia protocol, off insulin gtt  NEUROLOGIC A:  No acute  P:   Supportive care    FAMILY  - Updates: goals of card discussed on admit with the patient, his wife and 4 daughters all present. Hope is to be aggressive, but have set limitations. He agrees to NO CPR or ventilation. Central access and pressors ok. Planing for further discussion 1/7 regarding the pancreatic CA and his overall prognosis.     Baltazar Apo, MD, PhD 06/21/2014, 6:09 AM [facility] [phone] or if  no answer [phone]

## 2014-06-22 ENCOUNTER — Ambulatory Visit
Admit: 2014-06-22 | Discharge: 2014-06-22 | Disposition: A | Discharge status: Home / Self Care | Payer: Medicare Other | Attending: Radiation Oncology | Admitting: Radiation Oncology

## 2014-06-22 ENCOUNTER — Ambulatory Visit
Admission: RE | Admit: 2014-06-22 | Discharge: 2014-06-22 | Disposition: A | Discharge status: Home / Self Care | Payer: Medicare Other | Source: Ambulatory Visit | Attending: Radiation Oncology | Admitting: Radiation Oncology

## 2014-06-22 DIAGNOSIS — E43 Unspecified severe protein-calorie malnutrition: Secondary | ICD-10-CM

## 2014-06-22 DIAGNOSIS — C250 Malignant neoplasm of head of pancreas: Secondary | ICD-10-CM

## 2014-06-22 LAB — GLUCOSE, CAPILLARY
GLUCOSE-CAPILLARY: 171 mg/dL — AB (ref 70–99)
Glucose-Capillary: 195 mg/dL — ABNORMAL HIGH (ref 70–99)
Glucose-Capillary: 177 mg/dL — ABNORMAL HIGH (ref 70–99)
Glucose-Capillary: 189 mg/dL — ABNORMAL HIGH (ref 70–99)
Glucose-Capillary: 237 mg/dL — ABNORMAL HIGH (ref 70–99)
Glucose-Capillary: 208 mg/dL — ABNORMAL HIGH (ref 70–99)

## 2014-06-22 MED ORDER — BELLADONNA ALKALOIDS-OPIUM 16.2-60 MG RE SUPP
1.0000 | Freq: Three times a day (TID) | RECTAL | Status: DC | PRN
  Administered 2014-06-22 – 2014-06-25 (×5): 1 via RECTAL
  Filled 2014-06-22 (×6): qty 1

## 2014-06-22 MED ORDER — INSULIN GLARGINE 100 UNIT/ML ~~LOC~~ SOLN
40.0000 [IU] | SUBCUTANEOUS | Status: DC
  Administered 2014-06-22: 40 [IU] via SUBCUTANEOUS
  Filled 2014-06-22 (×2): qty 0.4

## 2014-06-22 MED ORDER — FENTANYL CITRATE 0.05 MG/ML IJ SOLN
25.0000 ug | INTRAMUSCULAR | Status: DC | PRN
  Administered 2014-06-22 (×2): 50 ug via INTRAVENOUS
  Filled 2014-06-22 (×2): qty 2

## 2014-06-22 MED ORDER — PHENAZOPYRIDINE HCL 200 MG PO TABS
200.0000 mg | ORAL_TABLET | Freq: Three times a day (TID) | ORAL | Status: DC
  Administered 2014-06-22 – 2014-06-25 (×8): 200 mg via ORAL
  Filled 2014-06-22 (×14): qty 1

## 2014-06-22 MED ORDER — ENOXAPARIN SODIUM 80 MG/0.8ML ~~LOC~~ SOLN
70.0000 mg | Freq: Every day | SUBCUTANEOUS | Status: DC
  Administered 2014-06-22 – 2014-06-23 (×2): 70 mg via SUBCUTANEOUS
  Filled 2014-06-22 (×4): qty 0.8

## 2014-06-22 MED ORDER — HYDROCODONE-ACETAMINOPHEN 10-325 MG PO TABS
1.0000 | ORAL_TABLET | ORAL | Status: DC | PRN
  Administered 2014-06-24: 1 via ORAL
  Filled 2014-06-22: qty 2

## 2014-06-22 MED ORDER — ONDANSETRON HCL 4 MG/2ML IJ SOLN: 4.0000 mg | Freq: Three times a day (TID) | INTRAMUSCULAR | Status: DC | PRN

## 2014-06-22 MED ORDER — DIPHENHYDRAMINE HCL 50 MG PO CAPS: 50.0000 mg | ORAL_CAPSULE | Freq: Every evening | ORAL | Status: DC | PRN

## 2014-06-22 MED ORDER — METRONIDAZOLE 500 MG PO TABS
500.0000 mg | ORAL_TABLET | Freq: Three times a day (TID) | ORAL | Status: DC
  Administered 2014-06-22 – 2014-06-24 (×6): 500 mg via ORAL
  Filled 2014-06-22 (×12): qty 1

## 2014-06-22 MED ORDER — DIPHENHYDRAMINE HCL 50 MG PO CAPS
50.0000 mg | ORAL_CAPSULE | Freq: Every evening | ORAL | Status: DC | PRN
  Administered 2014-06-22 – 2014-06-24 (×2): 50 mg via ORAL
  Filled 2014-06-22 (×2): qty 1

## 2014-06-22 MED ORDER — ONDANSETRON HCL 4 MG/2ML IJ SOLN
4.0000 mg | Freq: Three times a day (TID) | INTRAMUSCULAR | Status: DC
  Filled 2014-06-22 (×2): qty 2

## 2014-06-22 NOTE — Progress Notes (Signed)
NUTRITION FOLLOW UP  Intervention:   -Recommend pt trial Butter Pecan Glucerna, Boost GlucoseControl, or Sugar Free [employer]; provided sample supplements, family would have to provide supplements from outside as [facility] does not have these supplements on formulary -Continue with [employer] once daily -Continue with MagicMouth wash w/lidocaine for assistance with mucositis and candidiasis  -Encourage PO intake   Nutrition Dx:   Inadequate oral intake related to taste changes/dry mouth as evidenced by PO intake < 75%, 20 lb weight loss; ongoing  Goal:   Pt to meet >/= 90% of their estimated nutrition needs    Monitor: Supplement tolerance, total protein/energy intake, labs, weights, glucose profile, GI profile  Assessment:   1/06: -Pt reported decreased appetite since 03/2014, and unintentional wt loss of 20 lbs in one month (7% body weight loss, significant for time frame) Unable to determine usual body weight d/t fluid fluctuations -Diet recall indicates pt consuming two Glucerna shakes daily, with much encouragement from family -Pt does not consume solid foods d/t feelings of dry mouth and lack of taste; encourage use of oral rinses and mints/lemon drops to assist with dry mouth and improve taste alterations  -Has tried [employer] during a previous admit, and enjoyed taste. Will order once CBG within goal -Had 5-6 loose stools/day pta; c.diff results pending; which also would likely contribute to poor PO intake -NPO for CT of abd -CBG elevated, Low K being repleted  1/08: -Received verbal consult from RN to address supplement needs as pt very resistant towards drinking Glucerna shakes -Pt continues with elevated CBG, > 200 -PO intake 50-75%, receiving Resource Breeze q24h and Glucerna BID ordered -RD discontinued Glucerna. Provided pt with DM-friendly nutrition supplement alternatives; instructed to RN that family would have to provide as they are not  available on [place] -Will continue with Resource Breeze -Receiving MagicMouth wash w/lidocaine for assistance with mucositis and thrush -Continues with loose stools; likely r/t to chronic disease. C.diff results still pending  Height: Ht Readings from Last 1 Encounters:  06/19/14 6\' 6"  (1.981 m)    Weight Status:   Wt Readings from Last 1 Encounters:  06/22/14 302 lb 4 oz (137.1 kg)    Re-estimated needs:  Kcal: 2700-2900 Protein: >/=145 gram Fluid: per MD  Skin: 3 generalized edema, +3 RUE edema, +3 LUE edema, +3 RLE and +3 LLE edema  Diet Order: Diet Carb Modified   Intake/Output Summary (Last 24 hours) at 06/22/14 1459 Last data filed at 06/22/14 1400  Gross per 24 hour  Intake   2820 ml  Output    755 ml  Net   2065 ml    Last BM: 1/08   Labs:   Recent Labs Lab 06/19/14 1254 06/19/14 1528 06/20/14 0420 06/21/14 0500  NA 130*  --  132* 137  K 3.9  --  3.2* 3.6  CL 100  --  104 109  CO2 18*  --  20 18*  BUN 81*  --  75* 80*  CREATININE 1.77*  --  1.59* 2.01*  CALCIUM 8.1*  --  7.4* 7.4*  MG  --  2.2  --   --   PHOS  --  5.6*  --   --   GLUCOSE 326*  --  310* 287*    CBG (last 3)   Recent Labs  06/22/14 0421 06/22/14 0810 06/22/14 1209  GLUCAP 177* 189* 237*    Scheduled Meds: . antiseptic oral rinse  7 mL Mouth Rinse q12n4p  .  chlorhexidine  15 mL Mouth Rinse BID  . enoxaparin (LOVENOX) injection  70 mg Subcutaneous Q1200  . feeding supplement (RESOURCE BREEZE)  1 Container Oral Q24H  . insulin aspart  0-20 Units Subcutaneous TID WC  . insulin aspart  0-5 Units Subcutaneous QHS  . insulin aspart  8 Units Subcutaneous TID WC  . insulin glargine  40 Units Subcutaneous Q24H  . magic mouthwash w/lidocaine  15 mL Oral QID  . metroNIDAZOLE  500 mg Oral 3 times per day  . phenazopyridine  200 mg Oral TID WC  . sertraline  25 mg Oral Daily  . vancomycin  500 mg Oral 4 times per day    Continuous Infusions: . sodium chloride 75 mL/hr  at 06/22/14 0935  . sodium chloride Stopped (06/22/14 1200)    [place] RD LDN Clinical Dietitian [phone]

## 2014-06-22 NOTE — Plan of Care (Signed)
Problem: Phase III Progression Outcomes Goal: Voiding independently Outcome: Not Applicable Date Met:  62/94/76 Patient has a foley catheter.

## 2014-06-22 NOTE — Progress Notes (Signed)
OT Cancellation Note  Patient Details Name: [name] MRN: [id number] DOB: 1941-02-17   Cancelled Treatment:    Reason Eval/Treat Not Completed: Patient at procedure or test/ unavailable  Deakin Lacek 06/22/2014, 2:31 PM  Lesle Chris, OTR/L [phone] 06/22/2014

## 2014-06-22 NOTE — Progress Notes (Signed)
PULMONARY / CRITICAL CARE MEDICINE   Name: [name] MRN: [id number] DOB: 1940/07/21    ADMISSION DATE:  06/19/2014 CONSULTATION DATE:  1/5  REFERRING MD :  Alvino Chapel   CHIEF COMPLAINT:  Septic shock   INITIAL PRESENTATION:  74 year old male w/  known dx of pancreatic cancer and also sig h/o recurrent c-diff. F/b Shadad. Recently d/c 12/28 after being admitted for septic shock from cholangitis in setting of CBD stricture for which he was treated w/ abx and had biliary stent, then d/c to SNF for rehab.  Presents to [facility] ER from cancer center (XRT) on 1/5  w/ cc: pre-syncope, frequent diarrhea and episodes of vomiting for last 24-48 hrs. Admitted w/ working dx of septic shock, presumed abd source.    STUDIES:  CT abd 1/5>>> no acute findings   SIGNIFICANT EVENTS: 1/6: off pressors, shock resolved.  1/7: long discussion w/ family re: prognosis and current state of health. XRT resumed. PT started. ABX narrowed.   SUBJECTIVE:  Feels better. Only c/o is bladder spasms.   VITAL SIGNS: Temp:  [96.3 F (35.7 C)-100.7 F (38.2 C)] 96.3 F (35.7 C) (01/08 0800) Pulse Rate:  [76-97] 81 (01/08 0800) Resp:  [14-26] 22 (01/08 0800) BP: (96-131)/(36-73) 131/54 mmHg (01/08 0800) SpO2:  [95 %-100 %] 100 % (01/08 0800) Weight:  [137.1 kg (302 lb 4 oz)] 137.1 kg (302 lb 4 oz) (01/08 0300) Room air  HEMODYNAMICS:   VENTILATOR SETTINGS:   INTAKE / OUTPUT:  Intake/Output Summary (Last 24 hours) at 06/22/14 0900 Last data filed at 06/22/14 0800  Gross per 24 hour  Intake   2895 ml  Output    725 ml  Net   2170 ml    PHYSICAL EXAMINATION: General:  Chronically ill appearing male, no distress.  Neuro:  Awake, oriented, no focal def, generalized weakness has improved  HEENT:  [place], no JVd  Cardiovascular:  rrr Lungs:  Decreased in bases, no accessory muscle use  Abdomen:  Large, tender to touch, tympanic percussion but improved  Musculoskeletal:  Intact  Skin:  Intact dressing to Left LE    LABS:  CBC  Recent Labs Lab 06/19/14 1254 06/20/14 0420 06/21/14 0500  WBC 48.7* 31.6* 32.2*  HGB 7.3* 7.0* 7.2*  HCT 23.6* 22.0* 22.5*  PLT 271 222 225   Coag's  Recent Labs Lab 06/19/14 1528  APTT 29  INR 1.29   BMET  Recent Labs Lab 06/19/14 1254 06/20/14 0420 06/21/14 0500  NA 130* 132* 137  K 3.9 3.2* 3.6  CL 100 104 109  CO2 18* 20 18*  BUN 81* 75* 80*  CREATININE 1.77* 1.59* 2.01*  GLUCOSE 326* 310* 287*   Electrolytes  Recent Labs Lab 06/19/14 1254 06/19/14 1528 06/20/14 0420 06/21/14 0500  CALCIUM 8.1*  --  7.4* 7.4*  MG  --  2.2  --   --   PHOS  --  5.6*  --   --    Sepsis Markers  Recent Labs Lab 06/19/14 1314 06/19/14 1528 06/19/14 1807  LATICACIDVEN 5.67* 4.8* 3.0*   ABG No results for input(s): PHART, PCO2ART, PO2ART in the last 168 hours. Liver Enzymes  Recent Labs Lab 06/19/14 1254 06/20/14 0420 06/21/14 0500  AST 34 22 19  ALT _0 ALKPHOS 122* 101 94  BILITOT 1.1 0.9 0.9  ALBUMIN 1.7* 1.3* 1.3*   Cardiac Enzymes No results for input(s): TROPONINI, PROBNP in the last 168 hours. Glucose  Recent Labs Lab  06/21/14 0810 06/21/14 1315 06/21/14 1538 06/21/14 2054 06/22/14 0421 06/22/14 0810  GLUCAP 274* 307* 278* 195* 177* 189*    Imaging No results found.   ASSESSMENT / PLAN:  PULMONARY OETT A:  OSA  P:   O2 pulm hygiene Nocturnal CPAP  Do not intubate   CARDIOVASCULAR CVL A:  Septic shock (resolved)  >presume abd source. Source c/w C diff but was PCR was negative. Hemodynamics now improved.  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 S/p biliary stent placement 12/26.  S/P < 2 weeks XRT only. (mass stable by CT and stent functioning) Ileus  Severe Protein calorie malnutrition  mucositis w/ associated odynophagia  Note diarrhea may also be due to his underlying disease. P:   See ID section  Adv diet as able Magic mw w/ lido. Day 1/14  HEMATOLOGIC A:   Anemia of critical illness.  Do not see evidence of bleeding.  P:  Add LMWH Transfuse for hgb < 7   INFECTIOUS A:   Septic shock. Presume abd source: Clinically consistent with C diff colitis but PCR negative. ID agrees with treating C diff.  Thrush w/ associated odynophagia  BCx2 1/5>>> UC 1/5>>> negative cdiff 1/5>> neg IV vanc 1/5>>> 1/7 Zosyn 1/5>>> 1/7 P:   IV flagyl 1/5>>>change to PO 1/8>>>(total 14d) Oral vanc 1/5>>>(total 14d) vanc enema 1/5>>>1/6 Cont magic MW w/ lido started 1/7>>>(14d course)   ENDOCRINE A:   Hyperglycemia in setting of known IDDM  P:   Hyperglycemia protocol, off insulin gtt  NEUROLOGIC A:  No acute  P:   Supportive care    FAMILY  - Updates: goals of card discussed on admit with the patient, his wife and 4 daughters all present. Hope is to be aggressive, but have set limitations. He agrees to NO CPR or ventilation. Central access and pressors ok. Planing for further discussion 1/7 regarding the pancreatic CA and his overall prognosis.   NP summary  Remains very weak and debilitated. Working dx here: Recurrent CDiff w/ septic shock. Shock now resolved. Additional current issues revolve around pain management re: his pancreatic cancer, bladder spasms and odynophagia. He has responded well to MMW with Lidocaine. At this point his plan of care re: acute issues... Cdiff: complete 14d oral vanc and  flagyl. Odynophagia and Mucositis w/ thrush: complete 14 d of MMW w/ Lido. Dysuria w/ bladder spasms: have added B&O supp and Pyridium. May need to consider just d/cing the catheter again. The major issue is his deconditioning. His current ECOG is 3. This will keep him from Chemo, but he is still continuing XRT. The patient really wants to go home instead of SNF this time. We are working with PT and OT along with the family to try and arrange this. He is ready for transfer to med/surg. Suspect that he will be ready for d/c around Monday assuming PT needs and home needs can be met. PCCM will transfer svc to [facility].  Erick Colace ACNP-BC [place] Pager # [phone] OR # [phone] if no answer  Attending Note:  I have examined patient, reviewed labs, studies and notes. I have discussed the case with Jerrye Bushy, and I agree with the data and plans as amended above.   Baltazar Apo, MD, PhD 06/22/2014, 5:59 PM [facility] [phone] or if no answer [phone]

## 2014-06-22 NOTE — Progress Notes (Signed)
PT Cancellation Note  Patient Details Name: [name] MRN: [id number] DOB: [birth year]   Cancelled Treatment:    Reason Eval/Treat Not Completed: Patient at procedure or test/unavailable. Pt leaving for radiation. Will have to check back another day. Thanks.    Weston Anna, MPT Pager: [phone]

## 2014-06-22 NOTE — Progress Notes (Addendum)
To Radiation, report called to floor by bedside RN. Techs to return pt to 1322 from Radiation treatment.

## 2014-06-22 NOTE — Progress Notes (Signed)
Weekly rad txs pancreas. sompleted 8 rad txs, still feels stuff gets stuck in throat,slight nausea,  Paged MD, offered warm blanket, daughter in with patient,"I feel so much better now" 3:31 PM

## 2014-06-22 NOTE — Progress Notes (Signed)
Patient  Is a transfer from ICU. Alert and oriented x3. No changed in condition from the report given by carolyn,ICU nurse. Denies pain. Kept comfortable in bed.-family at bedside. Will continue to moniotr the patient.- Sandie Ano RN

## 2014-06-22 NOTE — Progress Notes (Signed)
Department of Radiation Oncology  Phone:  [phone] Fax:        [phone]   INPATIENT   Weekly Treatment Note    Name: [name] Date: 06/22/2014 MRN: [id number] DOB: 11/19/1940   Current fraction: 8   MEDICATIONS: No current facility-administered medications for this encounter.   No current outpatient prescriptions on file.   Facility-Administered Medications Ordered in Other Encounters  Medication Dose Route Frequency Provider Last Rate Last Dose  . 0.9 %  sodium chloride infusion   Intravenous Continuous Erick Colace, NP 75 mL/hr at 06/22/14 0935    . 0.9 %  sodium chloride infusion   Intravenous Continuous Raylene Miyamoto, MD   Stopped at 06/22/14 1200  . acetaminophen (TYLENOL) tablet 650 mg  650 mg Oral Q6H PRN Rigoberto Noel, MD   650 mg at 06/20/14 0143  . antiseptic oral rinse (CPC / CETYLPYRIDINIUM CHLORIDE 0.05%) solution 7 mL  7 mL Mouth Rinse q12n4p Raylene Miyamoto, MD   7 mL at 06/22/14 1200  . chlorhexidine (PERIDEX) 0.12 % solution 15 mL  15 mL Mouth Rinse BID Raylene Miyamoto, MD   15 mL at 06/22/14 0753  . diphenhydrAMINE (BENADRYL) capsule 50 mg  50 mg Oral QHS PRN Erick Colace, NP      . enoxaparin (LOVENOX) injection 70 mg  70 mg Subcutaneous Q1200 Erick Colace, NP   70 mg at 06/22/14 1102  . feeding supplement (RESOURCE BREEZE) (RESOURCE BREEZE) liquid 1 Container  1 Container Oral Q24H Hazle Coca, RD   1 Container at 06/21/14 2000  . HYDROcodone-acetaminophen (NORCO) 10-325 MG per tablet 1-2 tablet  1-2 tablet Oral Q4H PRN Erick Colace, NP      . insulin aspart (novoLOG) injection 0-20 Units  0-20 Units Subcutaneous TID WC Erick Colace, NP   7 Units at 06/22/14 1249  . insulin aspart (novoLOG) injection 0-5 Units  0-5 Units Subcutaneous QHS Erick Colace, NP   2 Units at 06/20/14 2147  . insulin aspart (novoLOG) injection 8 Units  8 Units Subcutaneous TID WC Erick Colace, NP   8 Units at 06/22/14 1250  .  insulin glargine (LANTUS) injection 40 Units  40 Units Subcutaneous Q24H Erick Colace, NP      . magic mouthwash w/lidocaine  15 mL Oral QID Erick Colace, NP   15 mL at 06/22/14 1348  . metroNIDAZOLE (FLAGYL) tablet 500 mg  500 mg Oral 3 times per day Erick Colace, NP   500 mg at 06/22/14 1348  . ondansetron (ZOFRAN) injection 4 mg  4 mg Intravenous Q8H PRN Kara Mead V, MD      . opium-belladonna (B&O SUPPRETTES) suppository 1 suppository  1 suppository Rectal Q8H PRN Erick Colace, NP   1 suppository at 06/22/14 1242  . phenazopyridine (PYRIDIUM) tablet 200 mg  200 mg Oral TID WC Erick Colace, NP   200 mg at 06/22/14 1101  . sertraline (ZOLOFT) tablet 25 mg  25 mg Oral Daily Erick Colace, NP   25 mg at 06/22/14 9244  . vancomycin (VANCOCIN) 50 mg/mL oral solution 500 mg  500 mg Oral 4 times per day Erick Colace, NP   500 mg at 06/22/14 1101     ALLERGIES: Ativan; Clindamycin/lincomycin; Codeine; and Penicillins   LABORATORY DATA:  Lab Results  Component Value Date   WBC 32.2* 06/21/2014   HGB 7.2* 06/21/2014   HCT 22.5*  06/21/2014   MCV 93.8 06/21/2014   PLT 225 06/21/2014   Lab Results  Component Value Date   NA 137 06/21/2014   K 3.6 06/21/2014   CL 109 06/21/2014   CO2 18* 06/21/2014   Lab Results  Component Value Date   ALT 12 06/21/2014   AST 19 06/21/2014   ALKPHOS 94 06/21/2014   BILITOT 0.9 06/21/2014     NARRATIVE: [name] was seen today for weekly treatment management. The chart was checked and the patient's films were reviewed.  The patient states he is doing better each day. He has been hospitalized once again for presumed sepsis/infection. He states treatment went well today. Some occasional nausea.  PHYSICAL EXAMINATION:    The patient is in good spirits. Alert, in no acute distress. Lying in a hospital bed in clinic.      ASSESSMENT: The patient is doing satisfactorily with treatment.  PLAN: We will continue with the patient's  radiation treatment as planned.

## 2014-06-23 ENCOUNTER — Inpatient Hospital Stay (HOSPITAL_COMMUNITY): Payer: Medicare Other

## 2014-06-23 DIAGNOSIS — N179 Acute kidney failure, unspecified: Secondary | ICD-10-CM

## 2014-06-23 LAB — COMPREHENSIVE METABOLIC PANEL
ALT: 10 U/L (ref 0–53)
AST: 18 U/L (ref 0–37)
Albumin: 1.3 g/dL — ABNORMAL LOW (ref 3.5–5.2)
Alkaline Phosphatase: 96 U/L (ref 39–117)
Anion gap: 12 (ref 5–15)
BUN: 75 mg/dL — ABNORMAL HIGH (ref 6–23)
CALCIUM: 7.6 mg/dL — AB (ref 8.4–10.5)
CO2: 17 mmol/L — AB (ref 19–32)
CREATININE: 2.13 mg/dL — AB (ref 0.50–1.35)
Chloride: 111 mEq/L (ref 96–112)
GFR calc Af Amer: 34 mL/min — ABNORMAL LOW (ref >90)
GFR calc non Af Amer: 29 mL/min — ABNORMAL LOW (ref >90)
GLUCOSE: 205 mg/dL — AB (ref 70–99)
Potassium: 3.9 mmol/L (ref 3.5–5.1)
Sodium: 140 mmol/L (ref 135–145)
Total Bilirubin: 0.9 mg/dL (ref 0.3–1.2)
Total Protein: 5 g/dL — ABNORMAL LOW (ref 6.0–8.3)

## 2014-06-23 LAB — CBC
HCT: 24.4 % — ABNORMAL LOW (ref 39.0–52.0)
HEMOGLOBIN: 7.4 g/dL — AB (ref 13.0–17.0)
MCH: 29 pg (ref 26.0–34.0)
MCHC: 30.3 g/dL (ref 30.0–36.0)
MCV: 95.7 fL (ref 78.0–100.0)
PLATELETS: 153 10*3/uL (ref 150–400)
RBC: 2.55 MIL/uL — ABNORMAL LOW (ref 4.22–5.81)
RDW: 20 % — AB (ref 11.5–15.5)
WBC: 38 10*3/uL — ABNORMAL HIGH (ref 4.0–10.5)

## 2014-06-23 LAB — GLUCOSE, CAPILLARY
Glucose-Capillary: 184 mg/dL — ABNORMAL HIGH (ref 70–99)
Glucose-Capillary: 218 mg/dL — ABNORMAL HIGH (ref 70–99)

## 2014-06-23 MED ORDER — INSULIN GLARGINE 100 UNIT/ML ~~LOC~~ SOLN
20.0000 [IU] | Freq: Every day | SUBCUTANEOUS | Status: DC
  Administered 2014-06-23 – 2014-06-24 (×2): 20 [IU] via SUBCUTANEOUS
  Filled 2014-06-23 (×2): qty 0.2

## 2014-06-23 MED ORDER — PANTOPRAZOLE SODIUM 40 MG PO TBEC: 40.0000 mg | DELAYED_RELEASE_TABLET | Freq: Two times a day (BID) | ORAL | Status: DC

## 2014-06-23 MED ORDER — RANITIDINE HCL 150 MG/10ML PO SYRP
150.0000 mg | ORAL_SOLUTION | Freq: Once | ORAL | Status: AC
  Administered 2014-06-23: 150 mg via ORAL
  Filled 2014-06-23: qty 10

## 2014-06-23 MED ORDER — POLYETHYLENE GLYCOL 3350 17 G PO PACK
17.0000 g | PACK | Freq: Every day | ORAL | Status: DC
  Administered 2014-06-25: 17 g via ORAL
  Filled 2014-06-23 (×3): qty 1

## 2014-06-23 MED ORDER — CALCIUM CARBONATE ANTACID 500 MG PO CHEW: 1.0000 | CHEWABLE_TABLET | Freq: Four times a day (QID) | ORAL | Status: DC | PRN

## 2014-06-23 MED ORDER — PANTOPRAZOLE SODIUM 40 MG PO TBEC: 40.0000 mg | DELAYED_RELEASE_TABLET | Freq: Once | ORAL | Status: DC

## 2014-06-23 MED ORDER — FAMOTIDINE 40 MG/5ML PO SUSR: 40.0000 mg | Freq: Two times a day (BID) | ORAL | Status: DC

## 2014-06-23 MED ORDER — RANITIDINE HCL 150 MG/10ML PO SYRP
150.0000 mg | ORAL_SOLUTION | Freq: Two times a day (BID) | ORAL | Status: DC
  Administered 2014-06-23 – 2014-06-25 (×4): 150 mg via ORAL
  Filled 2014-06-23 (×5): qty 10

## 2014-06-23 NOTE — Progress Notes (Signed)
I met with patient/family briefly this morning. They had just met with Dr Aileen Fass and contemplating recommendation for comfort/hospice care given his situation with pancreatic cancer.  While they hear about numbers worsening, they also state he feels better than he has in weeks. While they will go home with hospice if it is felt ongoing aggressive medical interventions such as XRT will not help, they feel like the conversation today was different than a few days ago and he is "feeling better".  Ultimately what they want before choosing to go home with hospice is a second opinion from a provider they know and trust.  They did not have a problem with me visiting, but were looking to hear an opinion outside of someone introduced as a hospice physician (which I completley understand, as surely they would expect me to have a bias towards hospice care).  They have a palliative care physician as outpatient named Dr Brigitte Pulse but she is reportedly out of town.  I offered to call Dr Alen Blew (their Oncologist) for 2nd opinion, but they requested Marni Griffon from [facility] due to excellent care he provided in ICU and trust/rapport they developed with him.  I was able to reach [place] today, and he has graciously offered to go above and beyond and meet with family again late this afternoon.  If the decision after this meeting is to return home with hospice care (this would mean stopping radiation, but treatment of c-diff could be ongoing), this is actually setup through case management.  He would certainly qualify for home hospice care based on his cancer diagnosis and a prognosis of at least 6 months or less (in actuality his prognosis is much shorter).  If there is a question I can formally answer or if family desires I would be happy to perform formal consultation.   Doran Clay D.O. Palliative Medicine Team at [facility]  Pager: [phone] Team Phone: [phone]

## 2014-06-23 NOTE — Progress Notes (Signed)
Pt daughter placed pt on home CPAP, Pt resting comfortably at this time.  RT to monitor and assess as needed.

## 2014-06-23 NOTE — Progress Notes (Signed)
Received call from Dr Derrill Memo (patient PCP). She had discussion with family today and felt that home hospice is appropriate along with stopping further radiation treatments.  I have placed case manager referral to arrange home hospice care. Spoke to Dr Brigitte Pulse about medications for hospice at home that she had questions about. I would continue current meds with exception of lovenox and his insulin regimen.  Dr Brigitte Pulse discussed stopping aggressive glucose management with patient. i would recommend only doing lantus and would reduce dose to 20-30U given that he wont be checking sugars and has poor PO intake which may lead to hypoglycemia.  Feel free to contact me with questions. I dont see any reason why he could not be discharged home tomorrow.   Doran Clay D.O. Palliative Medicine Team at [facility]  Pager: [phone] Team Phone: [phone]

## 2014-06-23 NOTE — Progress Notes (Addendum)
[facility] PROGRESS NOTE Interim History: 74 year old male w/ known dx of pancreatic cancer and also sig h/o recurrent c-diff. F/b Shadad. Recently d/c 12/28 after being admitted for septic shock from cholangitis in setting of CBD stricture for which he was treated w/ abx and had biliary stent, then d/c to SNF for rehab. Presents to [facility] ER from cancer center (XRT) on 1/5 w/ cc: pre-syncope, frequent diarrhea and episodes of vomiting for last 24-48 hrs   Assessment/Plan: Septic shock unknown source presumable abdominal possible  Enteritis due to Clostridium difficile: - Was having diarhea, abd pain, leukocytosis and AKI on admisison. Clinically compatible with C. Dif colitis unfortunately C. Dif PCR negative. - Started empirically on vanc and flagyl on admission and it was discuss with ID. - It was d/w patient and he agreed. - His WBC I think it is due to his pancreatic cancer and his AKI cont to worsen despite IV hydration, his urine seem concentrated and he is 3rd spacing (due to the fact that his albumin 1.3), which would explain the poor perfusion his kidneys are having.  AKI (acute kidney injury) - Due to pre-renal etiology. - Pt is positive 10L. Since admission. His albumin is 1.0 - Will KVO IV fluids as he cont to 3rd space.  Protein-calorie malnutrition, severe due to Pancreatic cancer - cont regular diet. Pt with poor appetite. Hi naval has erased and he relates he gets fools with 1 or 2 bites of food. I think his gut is edematous the same way his extremities as edematous, which is contributing to his early satiety.  - he is 3rd spacing due to low albumin.  CA head of pancreas  Non anion gap Metabolic acidosis: - due to renal dysfunction.   Anemia of critical illness: - cont to monitor Hbg. - unlikely to make any difference with transfusion.  Thrush w/ associated odynophagia : - resolved.  Uncontrolled Diabetes mellitus: - cont long acting insulin plus  SSI.  Code Status: DNR Family Communication: daughter  Disposition Plan: inpatient   Consultants:  PCCM   Procedures:  CT abd   cxr  Antibiotics: BCx2 1/5>>> UC 1/5>>> negative cdiff 1/5>> neg IV vanc 1/5>>> 1/7 Zosyn 1/5>>> 1/7  HPI/Subjective: Relates he feels better  Objective: Filed Vitals:   06/22/14 1300 06/22/14 1400 06/22/14 2100 06/23/14 0445  BP: 130/50 123/55 140/61 128/64  Pulse: 84 87 87 80  Temp: 97.7 F (36.5 C) 97.9 F (36.6 C) 97.5 F (36.4 C) 97.4 F (36.3 C)  TempSrc:   Axillary Oral  Resp: 16 22 20 18   Height:      Weight:      SpO2: 99% 99% 97% 98%    Intake/Output Summary (Last 24 hours) at 06/23/14 0747 Last data filed at 06/23/14 0600  Gross per 24 hour  Intake   2505 ml  Output   1630 ml  Net    875 ml   Filed Weights   06/20/14 0500 06/21/14 0500 06/22/14 0300  Weight: 135.4 kg (298 lb 8.1 oz) 137.1 kg (302 lb 4 oz) 137.1 kg (302 lb 4 oz)    Exam:  General: Alert, awake, oriented x3, in no acute distress. He is 3rd spacing HEENT: No bruits, no goiter.  Heart: Regular rate and rhythm. Lungs: Good air movement, clear Abdomen: distended abd.  Data Reviewed: Basic Metabolic Panel:  Recent Labs Lab 06/19/14 1254 06/19/14 1528 06/20/14 0420 06/21/14 0500 06/23/14 0505  NA 130*  --  132* 137  140  K 3.9  --  3.2* 3.6 3.9  CL 100  --  104 109 111  CO2 18*  --  20 18* 17*  GLUCOSE 326*  --  310* 287* 205*  BUN 81*  --  75* 80* 75*  CREATININE 1.77*  --  1.59* 2.01* 2.13*  CALCIUM 8.1*  --  7.4* 7.4* 7.6*  MG  --  2.2  --   --   --   PHOS  --  5.6*  --   --   --    Liver Function Tests:  Recent Labs Lab 06/19/14 1254 06/20/14 0420 06/21/14 0500 06/23/14 0505  AST 34 22 19 18   ALT 18 13 12 10   ALKPHOS 122* 101 94 96  BILITOT 1.1 0.9 0.9 0.9  PROT 6.1 5.1* 4.9* 5.0*  ALBUMIN 1.7* 1.3* 1.3* 1.3*    Recent Labs Lab 06/19/14 1254 06/19/14 1528  LIPASE 21 19   No results for input(s): AMMONIA in the  last 168 hours. CBC:  Recent Labs Lab 06/19/14 1254 06/20/14 0420 06/21/14 0500 06/23/14 0505  WBC 48.7* 31.6* 32.2* 38.0*  NEUTROABS 45.0*  --   --   --   HGB 7.3* 7.0* 7.2* 7.4*  HCT 23.6* 22.0* 22.5* 24.4*  MCV 93.3 92.4 93.8 95.7  PLT 271 222 225 153   Cardiac Enzymes: No results for input(s): CKTOTAL, CKMB, CKMBINDEX, TROPONINI in the last 168 hours. BNP (last 3 results)  Recent Labs  01/12/14 1244  PROBNP 67.7   CBG:  Recent Labs Lab 06/22/14 0421 06/22/14 0810 06/22/14 1209 06/22/14 1655 06/22/14 2220  GLUCAP 177* 189* 237* 208* 171*    Recent Results (from the past 240 hour(s))  Culture, blood (routine x 2)     Status: None (Preliminary result)   Collection Time: 06/19/14 12:55 PM  Result Value Ref Range Status   Specimen Description BLOOD LEFT ANTECUBITAL  Final   Special Requests BOTTLES DRAWN AEROBIC AND ANAEROBIC 5ML  Final   Culture   Final           BLOOD CULTURE RECEIVED NO GROWTH TO DATE CULTURE WILL BE HELD FOR 5 DAYS BEFORE ISSUING A FINAL NEGATIVE REPORT Performed at [facility]    Report Status PENDING  Incomplete  Culture, blood (routine x 2)     Status: None (Preliminary result)   Collection Time: 06/19/14 12:59 PM  Result Value Ref Range Status   Specimen Description BLOOD LEFT FOREARM  Final   Special Requests BOTTLES DRAWN AEROBIC AND ANAEROBIC 4ML  Final   Culture   Final           BLOOD CULTURE RECEIVED NO GROWTH TO DATE CULTURE WILL BE HELD FOR 5 DAYS BEFORE ISSUING A FINAL NEGATIVE REPORT Performed at [facility]    Report Status PENDING  Incomplete  MRSA PCR Screening     Status: None   Collection Time: 06/19/14  5:19 PM  Result Value Ref Range Status   MRSA by PCR NEGATIVE NEGATIVE Final    Comment:        The GeneXpert MRSA Assay (FDA approved for NASAL specimens only), is one component of a comprehensive MRSA colonization surveillance program. It is not intended to diagnose MRSA infection nor to  guide or monitor treatment for MRSA infections.   Urine culture     Status: None   Collection Time: 06/19/14  6:39 PM  Result Value Ref Range Status   Specimen Description URINE, CATHETERIZED  Final  Special Requests NONE  Final   Colony Count NO GROWTH Performed at [facility]   Final   Culture NO GROWTH Performed at [facility]   Final   Report Status 06/21/2014 FINAL  Final  Clostridium Difficile by PCR     Status: None   Collection Time: 06/19/14  7:40 PM  Result Value Ref Range Status   C difficile by pcr NEGATIVE NEGATIVE Final    Comment: Performed at [facility]     Studies: No results found.  Scheduled Meds: . antiseptic oral rinse  7 mL Mouth Rinse q12n4p  . chlorhexidine  15 mL Mouth Rinse BID  . enoxaparin (LOVENOX) injection  70 mg Subcutaneous Q1200  . feeding supplement (RESOURCE BREEZE)  1 Container Oral Q24H  . insulin aspart  0-20 Units Subcutaneous TID WC  . insulin aspart  0-5 Units Subcutaneous QHS  . insulin aspart  8 Units Subcutaneous TID WC  . insulin glargine  40 Units Subcutaneous Q24H  . magic mouthwash w/lidocaine  15 mL Oral QID  . metroNIDAZOLE  500 mg Oral 3 times per day  . phenazopyridine  200 mg Oral TID WC  . sertraline  25 mg Oral Daily  . vancomycin  500 mg Oral 4 times per day   Continuous Infusions: . sodium chloride 75 mL/hr at 06/23/14 0610  . sodium chloride Stopped (06/22/14 1200)     Charlynne Cousins  [facility] Pager [phone]. If 8PM-8AM, please contact night-coverage at [email or web address], password [facility] 06/23/2014, 7:47 AM  LOS: 4 days

## 2014-06-23 NOTE — Care Management (Addendum)
CARE MANAGEMENT NOTE 06/23/2014  Patient:  [name], [name]   Account Number:  [id number]  Date Initiated:  06/20/2014  Documentation initiated by:  [name]  Subjective/Objective Assessment:   adm: Septic shock     Action/Plan:   discharge planning   Anticipated DC Date:  06/24/2014   Anticipated DC Plan:  [place]  CM consult      [facility]  HOSPICE   Choice offered to / List presented to:  C-3 Spouse           [name]   Status of service:  Completed, signed off Medicare Important Message given?   (If response is "NO", the following Medicare IM given date fields will be blank) Date Medicare IM given:   Medicare IM given by:   Date Additional Medicare IM given:   Additional Medicare IM given by:    Discharge Disposition:  [place]  Per UR Regulation:    If discussed at Long Length of Stay Meetings, dates discussed:    Comments: 06/23/14 1500 - CM spoke with patient and family. Offered choice for Hospice Providers. Selected [facility]. Patient has a wheelchair, shower bench, walker, 3N1 at home. Family/patient plan to try to use the bed he has at home before deciding whether or not a [facility] bed is needed. Asked about a wedge for his bed. Referred to a medical supply company for the wedge. Family states [name] with [facility] is his PCP and will continue to be his PCP when he is discharged. [name] at [name] states she will complete an evaluation tomorrow. Family made aware. CM called [name] at [facility] and cancelled home health services.  [name] Sheffield RN BSN CCM [phone]   06/21/14 [name] Phi RN BSN NCM [phone] [name].Await final orders.Confirmed w/[name] family adamantly declines SNF.   06/20/13 13:30 CM received callback and pt NOT ACCEPTED TO LTAC bc of planned XRT.  Pt's family aware and are adamant in taking pt  home with home health rather than return to a SNF.  CM explained the limited time increment of HH services which would be covered by insurance and gave family a LIST OF PRIVATE DUTY AGENCIES(out of pocket expense)  to explore.  Family encouraged to be present during PT/OT eval understand the needs of the pt when he gets home.  Lauro Regulus has chosen [facility] to render [facility] services.  CM called referral to [facility] rep, [name].  Will continue to follow for orders.  [name], BSN, CM [phone]. 13:00 CM spoke with [name], [name] who states family is interested in [place] (pt is from [facility] and family was not happy with experience).  CM spoke with family, and wife of pt, [name] [phone] confirmed choice of [facility] and states she is NOT interested in [place] and would bring him home if not accepted into [facility].  CM spoke with [facility] rep, [name], and asked if pt would be accepted if pt continues to receive radiation treament; according to family, XRT is plan on a daily basis through January.  CM has placed call to NP to confirm bc LTACs will NOT accept if this is the plan. Waiting for callback.   [name], BSN, Cm [phone].

## 2014-06-24 ENCOUNTER — Ambulatory Visit: Payer: Medicare Other

## 2014-06-24 MED ORDER — DIPHENHYDRAMINE HCL 50 MG PO TABS: 50.0000 mg | ORAL_TABLET | Freq: Every evening | ORAL | Status: AC | PRN

## 2014-06-24 MED ORDER — DIPHENHYDRAMINE HCL 50 MG PO CAPS
50.0000 mg | ORAL_CAPSULE | Freq: Four times a day (QID) | ORAL | Status: DC | PRN
  Administered 2014-06-24 – 2014-06-25 (×3): 50 mg via ORAL
  Filled 2014-06-24 (×3): qty 1

## 2014-06-24 MED ORDER — PREDNISONE 50 MG PO TABS
60.0000 mg | ORAL_TABLET | Freq: Every day | ORAL | Status: DC
  Administered 2014-06-24 – 2014-06-25 (×2): 60 mg via ORAL
  Filled 2014-06-24 (×3): qty 1

## 2014-06-24 MED ORDER — PREDNISONE 20 MG PO TABS: 40.0000 mg | ORAL_TABLET | Freq: Every day | ORAL | Status: AC

## 2014-06-24 MED ORDER — MORPHINE SULFATE (CONCENTRATE) 10 MG /0.5 ML PO SOLN: 20.0000 mg | ORAL | Status: AC | PRN

## 2014-06-24 MED ORDER — PREDNISONE 50 MG PO TABS: 60.0000 mg | ORAL_TABLET | Freq: Every day | ORAL | Status: DC

## 2014-06-24 MED ORDER — ONDANSETRON 8 MG PO TBDP: 8.0000 mg | ORAL_TABLET | Freq: Three times a day (TID) | ORAL | Status: AC | PRN

## 2014-06-24 MED ORDER — METRONIDAZOLE 500 MG PO TABS: 500.0000 mg | ORAL_TABLET | Freq: Three times a day (TID) | ORAL | Status: DC

## 2014-06-24 MED ORDER — MORPHINE SULFATE (CONCENTRATE) 10 MG/0.5ML PO SOLN
10.0000 mg | ORAL | Status: DC | PRN
  Administered 2014-06-24 – 2014-06-25 (×5): 10 mg via ORAL
  Filled 2014-06-24 (×5): qty 0.5

## 2014-06-24 MED ORDER — CALCIUM CARBONATE ANTACID 500 MG PO CHEW: 1.0000 | CHEWABLE_TABLET | Freq: Four times a day (QID) | ORAL | Status: AC | PRN

## 2014-06-24 NOTE — Progress Notes (Signed)
CARE MANAGEMENT NOTE 06/24/2014  Patient:  [name], [name]   Account Number:  [id number]  Date Initiated:  06/20/2014  Documentation initiated by:  [facility]  Subjective/Objective Assessment:   adm: Septic shock     Action/Plan:   discharge planning   Anticipated DC Date:  06/24/2014   Anticipated DC Plan:  [place]  CM consult      [facility] Choice  HOSPICE   Choice offered to / List presented to:  C-3 Spouse           [place]   Status of service:  Completed, signed off Medicare Important Message given?  YES (If response is "NO", the following Medicare IM given date fields will be blank) Date Medicare IM given:  06/24/2014 Medicare IM given by:  [facility] Date Additional Medicare IM given:   Additional Medicare IM given by:    Discharge Disposition:  [place]  Per UR Regulation:    If discussed at Long Length of Stay Meetings, dates discussed:    Comments:  06/24/2014 1300 Spoke to [place] and they will admit to service tomorrow after pt is dc from hospital. CSW referral for ambulance transport on 06/25/2014. [name] Finner RN Case Mgmt [phone]  06/23/14 1500 - CM spoke with patient and family. Offered choice for Hospice Providers. Selected [facility]. Patient has a wheelchair, shower bench, walker, 3N1 at home. Family/patient plan to try to use the bed he has at home before deciding whether or not a hospital bed is needed. Asked about a wedge for his bed. Referred to a medical supply company for the wedge. Family states Dr. Serita Grammes with [facility] is his PCP and will continue to be his PCP when he is discharged. Beverlee Nims at [facility] states she will complete an evaluation tomorrow. Family made aware. CM called Stephanie at [facility] and cancelled home health services.  Venita Sheffield RN BSN CCM [phone]   06/21/14 [name] Phi RN BSN NCM [phone]  9833 [place] following for [name].Await final orders.Confirmed w/CSW family adamantly declines SNF.   06/20/13 13:30 CM received callback and pt NOT ACCEPTED TO LTAC bc of planned XRT.  Pt's family aware and are adamant in taking pt home with home health rather than return to a SNF.  CM explained the limited time increment of HH services which would be covered by insurance and gave family a LIST OF PRIVATE DUTY AGENCIES(out of pocket expense)  to explore.  Family encouraged to be present during PT/OT eval understand the needs of the pt when he gets home.  Lauro Regulus has chosen [facility] to render [facility] services.  CM called referral to [facility] rep, [name].  Will continue to follow for orders.  [name], BSN, CM [phone]. 13:00 CM spoke with CSW, [name] who states family is interested in [place] (pt is from [facility] and family was not happy with experience).  CM spoke with family, and wife of pt, [name] [phone] confirmed choice of [facility] and states she is NOT interested in [place] and would bring him home if not accepted into [facility].  CM spoke with [facility] rep, [name], and asked if pt would be accepted if pt continues to receive radiation treament; according to family, XRT is plan on a daily basis through January.  CM has placed call to NP to confirm bc LTACs will NOT accept if this is  the plan. Waiting for callback.   [name], BSN, Cm [phone].

## 2014-06-24 NOTE — Clinical Social Work Note (Signed)
CSW received a call for pt discharge home  CSW provided discharge packet to RN  CSW received a call that pt may not leave until 6 tonight or possible in the morning so packet was left with RN  Week day CSW to follow up if transportation is needed  .Dede Query, LCSW [facility] Clinical Social Worker - Weekend Coverage cell #: [phone]

## 2014-06-24 NOTE — Discharge Summary (Addendum)
Physician Discharge Summary  [name] QJF:[id number] DOB: [birth year] DOA: 06/19/2014  PCP: Lupita Raider, MD  Admit date: 06/19/2014 Discharge date: 06/25/2014  Time spent: 35 minutes  Recommendations for Outpatient Follow-up:  1. Hospice will follow at home.  Discharge Diagnoses:  Principal Problem:   Septic shock Active Problems:   AKI (acute kidney injury)   Enteritis due to Clostridium difficile   CA head of pancreas   Pancreatic cancer   Protein-calorie malnutrition, severe   Discharge Condition:Guarded  Diet recommendation:comfort feeds  Filed Weights   06/20/14 0500 06/21/14 0500 06/22/14 0300  Weight: 135.4 kg (298 lb 8.1 oz) 137.1 kg (302 lb 4 oz) 137.1 kg (302 lb 4 oz)    History of present illness:  74 year old male w/ known dx of pancreatic cancer and also sig h/o recurrent c-diff. F/b Shadad. Recently d/c 12/28 after being admitted for septic shock from cholangitis in setting of CBD stricture for which he was treated w/ abx and had biliary stent, then d/c to SNF for rehab. Presents to [facility] ER from cancer center (XRT) on 1/5 w/ cc: pre-syncope, frequent diarrhea and episodes of vomiting for last 24-48 hrs.  Hospital Course:  Septic shock unknown source presumable abdominal possible Enteritis due to Clostridium difficile: - Was having diarhea, abd pain, leukocytosis and AKI on admisison. Clinically compatible with C. Dif colitis unfortunately C. Dif PCR negative. - Started empirically on vanc and flagyl on admission and it was discuss with ID. Leukocytosis worsening. After having a long discussion with patient he related that would not want to take the vanc. - His WBC I think it is due to his pancreatic cancer. - consult PMT agreed pt is a candidate for hospice care. - Graciously Dr Philipp Deputy (patient PCP came by). She had discussion with family on 1.09.2016 and felt that home hospice is appropriate along with stopping further radiation treatments. The family and  patient agreed. - he would like to stop vanc and flagyl.  AKI (acute kidney injury): - I think his AKI cont to worsen despite IV hydration due decrease intravascular vol,  which would explain the poor perfusion his kidneys are having. He is 3rd spacing (due to the fact that his albumin 1.3), - Pt is positive 10L.   Protein-calorie malnutrition, severe due to Pancreatic cancer - Pt with poor appetite. - after a long discussion he decided to move toward comfort.  CA head of pancreas: -  S/p XRT for 2 weeks with no improvement.  Non anion gap Metabolic acidosis: - due to renal dysfunction.   Anemia of critical illness: - cont to monitor Hbg.  Unknown allergic reaction: - Treated with benadryl and prednisone orally and it improved. - will go home on 3 days of prednisone.  Uncontrolled Diabetes mellitus: - will only due lantus 20 units.  Procedures:  CT abd and pelvix  abd x-ray  CXR  Consultations:  PMT  PCCM  Oncology  Discharge Exam: Filed Vitals:   06/25/14 0519  BP: 121/76  Pulse: 78  Temp: 97.6 F (36.4 C)  Resp: 19    General: A&O x3 Cardiovascular: RRR Respiratory: good air movement CTA B/L  Discharge Instructions   Discharge Instructions    Diet - low sodium heart healthy    Complete by:  As directed      Diet - low sodium heart healthy    Complete by:  As directed      Increase activity slowly    Complete by:  As  directed      Increase activity slowly    Complete by:  As directed           Current Discharge Medication List    START taking these medications   Details  calcium carbonate (TUMS - DOSED IN MG ELEMENTAL CALCIUM) 500 MG chewable tablet Chew 1 tablet (200 mg of elemental calcium total) by mouth every 6 (six) hours as needed for indigestion or heartburn. Qty: 30 tablet, Refills: 0    diphenhydrAMINE (BENADRYL) 50 MG tablet Take 1 tablet (50 mg total) by mouth at bedtime as needed for itching. Qty: 30 tablet, Refills: 0     Morphine Sulfate (MORPHINE CONCENTRATE) 10 mg / 0.5 ml concentrated solution Take 1 mL (20 mg total) by mouth every 3 (three) hours as needed for moderate pain, severe pain or shortness of breath. Qty: 120 mL, Refills: 0    ondansetron (ZOFRAN-ODT) 8 MG disintegrating tablet Take 1 tablet (8 mg total) by mouth every 8 (eight) hours as needed for nausea or vomiting. Qty: 20 tablet, Refills: 0    predniSONE (DELTASONE) 20 MG tablet Take 2 tablets (40 mg total) by mouth daily with breakfast. Qty: 8 tablet, Refills: 0      CONTINUE these medications which have NOT CHANGED   Details  omeprazole (PRILOSEC) 20 MG capsule Take 20 mg by mouth 2 (two) times daily.       STOP taking these medications     acetaminophen (TYLENOL) 325 MG tablet      ALPRAZolam (XANAX) 0.5 MG tablet      Alum & Mag Hydroxide-Simeth (MAGIC MOUTHWASH W/LIDOCAINE) SOLN      amoxicillin-clavulanate (AUGMENTIN) 875-125 MG per tablet      atorvastatin (LIPITOR) 20 MG tablet      doxazosin (CARDURA) 2 MG tablet      furosemide (LASIX) 20 MG tablet      hyaluronate sodium (RADIAPLEXRX) GEL      HYDROcodone-acetaminophen (NORCO/VICODIN) 5-325 MG per tablet      insulin aspart (NOVOLOG) 100 UNIT/ML injection      insulin aspart protamine- aspart (NOVOLOG MIX 70/30) (70-30) 100 UNIT/ML injection      metoprolol (LOPRESSOR) 50 MG tablet      Multiple Vitamins-Minerals (PRESERVISION AREDS PO)      Nutritional Supplements (NUTRITIONAL DRINK PO)      nystatin (MYCOSTATIN) 100000 UNIT/ML suspension      potassium chloride SA (K-DUR,KLOR-CON) 20 MEQ tablet      promethazine (PHENERGAN) 25 MG tablet      feeding supplement, GLUCERNA SHAKE, (GLUCERNA SHAKE) LIQD      fluorouracil (EFUDEX) 5 % cream      lidocaine-prilocaine (EMLA) cream      ondansetron (ZOFRAN) 8 MG tablet      potassium chloride SA (K-DUR,KLOR-CON) 10 MEQ tablet        Allergies  Allergen Reactions  . Ativan [Lorazepam] Anaphylaxis   . Clindamycin/Lincomycin Other (See Comments)    dysphagia  . Codeine Itching  . Penicillins Rash    As a child   Follow-up Information    Follow up with [facility].   Why:  please contact when you arrive home   Contact information:   [address] [place] [address] [phone]        The results of significant diagnostics from this hospitalization (including imaging, microbiology, ancillary and laboratory) are listed below for reference.    Significant Diagnostic Studies: Ct Abdomen Pelvis Wo Contrast  06/19/2014  CLINICAL DATA:  Progressive weakness with fever, history of pancreatic carcinoma and hypotension  EXAM: CT ABDOMEN AND PELVIS WITHOUT CONTRAST  TECHNIQUE: Multidetector CT imaging of the abdomen and pelvis was performed following the standard protocol without IV contrast.  COMPARISON:  05/31/2014  FINDINGS: Lung bases demonstrate bibasilar atelectatic changes and small pleural effusions.  The liver, spleen, adrenal glands and pancreas are stable in appearance. A persistent large pancreatic head mass is noted with impression upon the duodenum and common bile duct. A new common bile duct stent is seen. No biliary ductal dilatation is noted. Kidneys are well visualized bilaterally without obstructive changes. Some renal calculi are noted which are nonobstructive in nature.  There remains some free fluid within the abdomen similar to that seen on the prior exam.  The bladder is well distended. Some dependent density is noted likely related to contrast excretion. Prostatic calcifications are seen. No significant lymphadenopathy is noted the previously seen peripancreatic lymph node is involved by the pancreatic mass and less discrete on the current exam. No new significant adenopathy is noted. Degenerative changes of the lumbar spine are noted. Aortoiliac calcifications are seen.  IMPRESSION: Stable pancreatic head mass with new common bile duct stent.  The  remainder of the exam is stable from the prior study.   Electronically Signed   By: Inez Catalina M.D.   On: 06/19/2014 17:27   Dg Chest 2 View  06/19/2014   CLINICAL DATA:  Fever, hypotension, shortness of Breath  EXAM: CHEST  2 VIEW  COMPARISON:  06/02/2014  FINDINGS: Cardiomediastinal silhouette is stable. No acute infiltrate or pulmonary edema. Tiny left pleural effusion with left basilar atelectasis.  IMPRESSION: No acute infiltrate or pulmonary edema. Tiny left pleural effusion with left basilar atelectasis but   Electronically Signed   By: Lahoma Crocker M.D.   On: 06/19/2014 13:41   Dg Cervical Spine Complete  05/31/2014   CLINICAL DATA:  Weakness for 2 weeks. Intermittent pain in neck and upper back pain.  EXAM: CERVICAL SPINE  4+ VIEWS  COMPARISON:  None.  FINDINGS: Straightening of normal cervical lordosis. There is multi level disc space narrowing and ventral endplate spurring compatible with degenerative disc disease. The prevertebral soft tissue space is normal. Bilateral neuroforaminal narrowing due to posterior spur formation and facet joint hypertrophy is noted.  IMPRESSION: 1. Cervical degenerative disc disease 2. No acute findings.   Electronically Signed   By: Kerby Moors M.D.   On: 05/31/2014 15:54   Dg Shoulder Right  05/31/2014   CLINICAL DATA:  Shoulder pain  EXAM: RIGHT SHOULDER - 2+ VIEW  COMPARISON:  None.  FINDINGS: Normal alignment no fracture. Mild degenerative change in the [facility] joint.  IMPRESSION: Mild AC degenerative change.  No acute abnormality.   Electronically Signed   By: Franchot Gallo M.D.   On: 05/31/2014 16:01   Dg Abd 1 View  06/09/2014   CLINICAL DATA:  Biliary stent placement, preceding PTC secondary to pancreatic mass with CBD obstruction  EXAM: ABDOMEN - 1 VIEW; DG C-ARM 1-60 MIN - NRPT [facility]  COMPARISON:  Two digital C-arm fluoroscopic images obtained during the procedure are submitted for interpretation.  FLUOROSCOPY TIME:  2 min 24 seconds  FINDINGS: First  image demonstrates presence of an endoscope with a small amount of contrast and a dilated CBD proximal to the scope.  Second image demonstrates presence of a CBD wall stent with tapered narrowing of the distal aspect of the stent compatible with extrinsic compression by  known pancreatic head mass.  IMPRESSION: CBD stenting.   Electronically Signed   By: Lavonia Dana M.D.   On: 06/09/2014 14:26   Dg Abd 1 View  06/08/2014   CLINICAL DATA:  Pancreatic carcinoma  EXAM: DG C-ARM 1-60 MIN - NRPT [facility]; ABDOMEN - 1 VIEW  COMPARISON:  05/31/2014  FINDINGS: Multiple spot films were obtained during and attempted ERCP. Access to the common bile duct was not able to be performed due to the known pancreatic mass.  FLUOROSCOPY TIME:  5 min 6 seconds  IMPRESSION: Inability to cannulate the CBD.   Electronically Signed   By: Inez Catalina M.D.   On: 06/08/2014 12:04   Ct Abdomen Pelvis W Contrast  05/31/2014   CLINICAL DATA:  Generalized abdominal pain, worsening over past 2 weeks. Newly diagnosed pancreatic carcinoma.  EXAM: CT ABDOMEN AND PELVIS WITH CONTRAST  TECHNIQUE: Multidetector CT imaging of the abdomen and pelvis was performed using the standard protocol following bolus administration of intravenous contrast.  CONTRAST:  160mL OMNIPAQUE IOHEXOL 300 MG/ML SOLN, 16mL OMNIPAQUE IOHEXOL 300 MG/ML SOLN  COMPARISON:  05/12/2014  FINDINGS: Lower Chest: Tiny bilateral pleural effusions and dependent atelectasis.  Hepatobiliary: No masses or other significant abnormality identified. Prior cholecystectomy noted.  Pancreas: 6.4 cm mass involving the pancreatic head and descending duodenum. This is not significant changed in size compared to previous study. Increased hazy opacity is seen surrounding the duodenum and pancreatic head with small amount of fluid seen in Morison's pouch. This may be due to duodenitis or pancreatitis.  Spleen:  Within normal limits in size and appearance.  Adrenal Glands:  No mass identified.   Kidneys/Urinary Tract: Tiny less than 1 cm bilateral renal calculi are again noted. No evidence of ureteral calculi or hydronephrosis. Bilateral perinephric soft tissue stranding and small amount of fluid are stable in appearance and nonspecific.  Stomach/Bowel/Peritoneum: No evidence of wall thickening, mass, or obstruction. Colonic diverticulosis noted. No evidence of diverticulitis.  Vascular/Lymphatic: Peripancreatic lymphadenopathy noted, with largest lymph node measuring 1.8 cm on image 40, which is unchanged.  Reproductive:  No mass or other significant abnormality identified.  Other:  None.  Musculoskeletal:  No suspicious bone lesions identified.  IMPRESSION: Stable 6 cm mass involving the pancreatic head and duodenum, consistent with known pancreatic carcinoma. No evidence of obstruction.  Stable mild peripancreatic lymphadenopathy, consistent metastatic disease.  Increased hazy opacity surrounding the pancreatic head and duodenum and small amount of fluid within Morison's pouch. This may be related to pancreatitis or duodenitis.  Bilateral nonobstructive nephrolithiasis and diverticulosis incidentally noted.   Electronically Signed   By: Earle Gell M.D.   On: 05/31/2014 14:36   Ir Biliary Dilitation  06/09/2014   CLINICAL DATA:  Duodenum mass, biliary obstruction, hyperbilirubinemia, cholangitis, failed ERCP stent placement  EXAM: ULTRASOUND GUIDANCE FOR BILIARY ACCESS  PERCUTANEOUS TRANSHEPATIC CHOLANGIOGRAM  SUCCESSFUL INSERTION OF A PERCUTANEOUS CATHETER AND GUIDEWIRE INTO THE DUODENUM (TO SERVE AS ERCP ACCESS AND GUIDANCE FOR STENT PLACEMENT)  Date:  12/26/201512/26/2015 11:29 am  Radiologist:  Jerilynn Mages. Daryll Brod, MD  Guidance:  Ultrasound and fluoroscopic  FLUOROSCOPY TIME:  7 min 36 seconds  MEDICATIONS AND MEDICAL HISTORY: 2 g Rocephin administered within 1 hr of the procedure, 2 mg Versed, 100 mcg fentanyl  ANESTHESIA/SEDATION: 25 min  CONTRAST:  67mL OMNIPAQUE IOHEXOL 300 MG/ML  SOLN   COMPLICATIONS: None immediate  PROCEDURE: Informed consent was obtained from the patient following explanation of the procedure, risks, benefits and alternatives. The patient understands, agrees  and consents for the procedure. All questions were addressed. A time out was performed.  Maximal barrier sterile technique utilized including caps, mask, sterile gowns, sterile gloves, large sterile drape, hand hygiene, and Betadine.  Previous imaging reviewed. Under sterile conditions and local anesthesia, initially a peripheral right hepatic duct was punctured with ultrasound. There was return of bile. Limited initial cholangiogram performed opacifying a right hepatic duct. Under fluoroscopy, the opacified right hepatic duct was punctured percutaneously via a second needle stick. There was return of bile. Guidewire advanced into the biliary system. Accustick dilator set advanced into the common hepatic duct. Contrast injection performed for limited cholangiogram. This demonstrates diffuse biliary dilatation with distal obstruction. Patient is status post cholecystectomy. Catheter and guidewire were advanced distally into the duodenum across the obstruction. Amplatz guidewire and Kumpe catheter remain terminating in the duodenum. This was secured externally with a suture. Access was secured externally to be used for the endoscopic portion of the procedure.  Patient tolerated the procedure well.  No immediate complication.  IMPRESSION: Successful ultrasound and fluoroscopic percutaneous cholangiogram with insertion of a catheter and guidewire across the distal CBD obstruction as described. This will serve as ERCP access/guidance for endoscopic stent insertion later today.  Findings discussed with Dr. Arlyce Dice.   Electronically Signed   By: Ruel Favors M.D.   On: 06/09/2014 11:56   Nm Pet Image Initial (pi) Skull Base To Thigh  05/29/2014   CLINICAL DATA:  Initial treatment strategy for poorly differentiated carcinoma of  unclear primary.  EXAM: NUCLEAR MEDICINE PET SKULL BASE TO THIGH  TECHNIQUE: 15.9 mCi F-18 FDG was injected intravenously. Full-ring PET imaging was performed from the skull base to thigh after the radiotracer. CT data was obtained and used for attenuation correction and anatomic localization.  FASTING BLOOD GLUCOSE:  Value: 181 mg/dl  COMPARISON:  CT 50/60/4933  FINDINGS: NECK  No hypermetabolic lymph nodes in the neck.  CHEST  No hypermetabolic mediastinal or hilar nodes. No suspicious pulmonary nodules on the CT scan.  ABDOMEN/PELVIS  There is again demonstrated a mass along the second portion duodenum adjacent to pancreatic head measuring approximately 6.7 x 5.7 cm not changed in volume compared to prior. This mass is intensely hypermetabolic with SUV equal 11.9. Posterior to the mass there is a small hypermetabolic lymph node measuring 18 mm on image 143.  No evidence of hepatic metastasis. No additional metabolic lesions in the abdomen or pelvis. There is stranding around the left and right kidney.  SKELETON  No focal hypermetabolic activity to suggest skeletal metastasis.  IMPRESSION: 1. Mass along the C-loop of the duodenum adjacent to pancreatic head is intensely hypermetabolic consistent with carcinoma. 2. Adjacent small peripancreatic hypermetabolic lymph node. 3. No additional hypermetabolic adenopathy or metastatic disease.   Electronically Signed   By: Genevive Bi M.D.   On: 05/29/2014 16:17   US Abdomen Limited  06/07/2014   CLINICAL DATA:  Elevated LFTs. History of cholecystectomy. Pancreatic cancer.  EXAM: US ABDOMEN LIMITED - RIGHT UPPER QUADRANT  COMPARISON:  CT abdomen 05/31/2014  FINDINGS: Gallbladder:  Surgically absent.  Common bile duct:  Diameter: Dilated measuring 1.7 cm at the porta hepatis. There is mild central intrahepatic biliary dilatation.  Liver:  No focal lesion identified. Liver parenchyma is heterogeneous and difficult to penetrate.  Other: The small amount of ascites on  prior CT is not seen. The known pancreatic head mass is identified.  IMPRESSION: 1. Dilatation of the proximal common bowel duct, with mild central intrahepatic biliary  ductal dilatation. This appears grossly stable compared to prior CT. Postcholecystectomy. 2. Heterogeneous hepatic parenchyma without focal lesion. 3. The small amount of ascites on prior CT is not seen.   Electronically Signed   By: Rubye Oaks M.D.   On: 06/07/2014 12:12   Ir Ptc  06/09/2014   CLINICAL DATA:  Duodenum mass, biliary obstruction, hyperbilirubinemia, cholangitis, failed ERCP stent placement  EXAM: ULTRASOUND GUIDANCE FOR BILIARY ACCESS  PERCUTANEOUS TRANSHEPATIC CHOLANGIOGRAM  SUCCESSFUL INSERTION OF A PERCUTANEOUS CATHETER AND GUIDEWIRE INTO THE DUODENUM (TO SERVE AS ERCP ACCESS AND GUIDANCE FOR STENT PLACEMENT)  Date:  12/26/201512/26/2015 11:29 am  Radiologist:  Judie Petit. Ruel Favors, MD  Guidance:  Ultrasound and fluoroscopic  FLUOROSCOPY TIME:  7 min 36 seconds  MEDICATIONS AND MEDICAL HISTORY: 2 g Rocephin administered within 1 hr of the procedure, 2 mg Versed, 100 mcg fentanyl  ANESTHESIA/SEDATION: 25 min  CONTRAST:  60mL OMNIPAQUE IOHEXOL 300 MG/ML  SOLN  COMPLICATIONS: None immediate  PROCEDURE: Informed consent was obtained from the patient following explanation of the procedure, risks, benefits and alternatives. The patient understands, agrees and consents for the procedure. All questions were addressed. A time out was performed.  Maximal barrier sterile technique utilized including caps, mask, sterile gowns, sterile gloves, large sterile drape, hand hygiene, and Betadine.  Previous imaging reviewed. Under sterile conditions and local anesthesia, initially a peripheral right hepatic duct was punctured with ultrasound. There was return of bile. Limited initial cholangiogram performed opacifying a right hepatic duct. Under fluoroscopy, the opacified right hepatic duct was punctured percutaneously via a second needle stick.  There was return of bile. Guidewire advanced into the biliary system. Accustick dilator set advanced into the common hepatic duct. Contrast injection performed for limited cholangiogram. This demonstrates diffuse biliary dilatation with distal obstruction. Patient is status post cholecystectomy. Catheter and guidewire were advanced distally into the duodenum across the obstruction. Amplatz guidewire and Kumpe catheter remain terminating in the duodenum. This was secured externally with a suture. Access was secured externally to be used for the endoscopic portion of the procedure.  Patient tolerated the procedure well.  No immediate complication.  IMPRESSION: Successful ultrasound and fluoroscopic percutaneous cholangiogram with insertion of a catheter and guidewire across the distal CBD obstruction as described. This will serve as ERCP access/guidance for endoscopic stent insertion later today.  Findings discussed with Dr. Arlyce Dice.   Electronically Signed   By: Ruel Favors M.D.   On: 06/09/2014 11:56   Ir US Guidance  06/09/2014   CLINICAL DATA:  Duodenum mass, biliary obstruction, hyperbilirubinemia, cholangitis, failed ERCP stent placement  EXAM: ULTRASOUND GUIDANCE FOR BILIARY ACCESS  PERCUTANEOUS TRANSHEPATIC CHOLANGIOGRAM  SUCCESSFUL INSERTION OF A PERCUTANEOUS CATHETER AND GUIDEWIRE INTO THE DUODENUM (TO SERVE AS ERCP ACCESS AND GUIDANCE FOR STENT PLACEMENT)  Date:  12/26/201512/26/2015 11:29 am  Radiologist:  Judie Petit. Ruel Favors, MD  Guidance:  Ultrasound and fluoroscopic  FLUOROSCOPY TIME:  7 min 36 seconds  MEDICATIONS AND MEDICAL HISTORY: 2 g Rocephin administered within 1 hr of the procedure, 2 mg Versed, 100 mcg fentanyl  ANESTHESIA/SEDATION: 25 min  CONTRAST:  35mL OMNIPAQUE IOHEXOL 300 MG/ML  SOLN  COMPLICATIONS: None immediate  PROCEDURE: Informed consent was obtained from the patient following explanation of the procedure, risks, benefits and alternatives. The patient understands, agrees and  consents for the procedure. All questions were addressed. A time out was performed.  Maximal barrier sterile technique utilized including caps, mask, sterile gowns, sterile gloves, large sterile drape, hand hygiene, and Betadine.  Previous  imaging reviewed. Under sterile conditions and local anesthesia, initially a peripheral right hepatic duct was punctured with ultrasound. There was return of bile. Limited initial cholangiogram performed opacifying a right hepatic duct. Under fluoroscopy, the opacified right hepatic duct was punctured percutaneously via a second needle stick. There was return of bile. Guidewire advanced into the biliary system. Accustick dilator set advanced into the common hepatic duct. Contrast injection performed for limited cholangiogram. This demonstrates diffuse biliary dilatation with distal obstruction. Patient is status post cholecystectomy. Catheter and guidewire were advanced distally into the duodenum across the obstruction. Amplatz guidewire and Kumpe catheter remain terminating in the duodenum. This was secured externally with a suture. Access was secured externally to be used for the endoscopic portion of the procedure.  Patient tolerated the procedure well.  No immediate complication.  IMPRESSION: Successful ultrasound and fluoroscopic percutaneous cholangiogram with insertion of a catheter and guidewire across the distal CBD obstruction as described. This will serve as ERCP access/guidance for endoscopic stent insertion later today.  Findings discussed with Dr. Deatra Ina.   Electronically Signed   By: Daryll Brod M.D.   On: 06/09/2014 11:56   Dg Chest Port 1 View  06/19/2014   CLINICAL DATA:  74 year old male with septic shock. Interval central line placement.  EXAM: PORTABLE CHEST - 1 VIEW  COMPARISON:  Prior chest x-ray earlier today at 13:19 p.m.  FINDINGS: Interval placement of a right IJ approach central venous catheter. The tip of the catheter overlies the distal SVC. No  evidence of a pneumothorax or new pleural effusion. The mediastinum appears slightly widened, this is likely secondary to difference in technique. Mild vascular congestion. Low inspiratory volumes. No new airspace consolidation. No acute osseous abnormality.  IMPRESSION: 1. Interval placement of a right IJ central venous catheter. Catheter tip overlies the superior cavoatrial junction. No evidence of pneumothorax or other complicating feature. 2. Developing mild pulmonary vascular congestion.   Electronically Signed   By: Jacqulynn Cadet M.D.   On: 06/19/2014 17:06   Dg Chest Port 1 View  06/02/2014   CLINICAL DATA:  Dyspnea  EXAM: PORTABLE CHEST - 1 VIEW  COMPARISON:  May 31, 2014  FINDINGS: Central catheter tip is in the superior vena cava. No pneumothorax. There is atelectatic change in the left base. Lungs elsewhere clear. Heart is borderline enlarged in size with pulmonary vascularity within normal limits. No adenopathy.  IMPRESSION: Central catheter tip in superior vena cava. No pneumothorax. Left base atelectasis. Elsewhere lungs clear. No change in cardiac silhouette.   Electronically Signed   By: Lowella Grip M.D.   On: 06/02/2014 11:04   Dg Shoulder Left  05/31/2014   CLINICAL DATA:  Pain and weakness  EXAM: LEFT SHOULDER - 2+ VIEW  COMPARISON:  None.  FINDINGS: Frontal, Y scapular, and axillary images were obtained. There is no demonstrable fracture or dislocation. There is osteoarthritic change in the glenohumeral and acromioclavicular joints. No erosive change or intra-articular calcification.  IMPRESSION: Osteoarthritic change.  No fracture or dislocation.   Electronically Signed   By: Lowella Grip M.D.   On: 05/31/2014 15:53   Dg Abd Acute W/chest  05/31/2014   CLINICAL DATA:  Abdominal pain with nausea.  Pancreatic carcinoma.  EXAM: ACUTE ABDOMEN SERIES (ABDOMEN 2 VIEW & CHEST 1 VIEW)  COMPARISON:  PET-CT May 29, 2014 ; chest radiograph May 17, 2014  FINDINGS: PA  chest: There is atelectatic change in the left base. Elsewhere lungs are clear. Heart is borderline enlarged with pulmonary vascularity  within normal limits. No adenopathy.  Supine and left lateral decubitus abdomen: There is moderate stool throughout the colon. There is no bowel dilatation or air-fluid level suggesting obstruction. No free air. Surgical clips are present in the right upper quadrant. There is a phlebolith in left pelvis.  IMPRESSION: Unremarkable bowel gas pattern. No obstruction or free air. Left base atelectasis. No lung edema or consolidation.   Electronically Signed   By: Lowella Grip M.D.   On: 05/31/2014 12:22   Dg Abd Portable 1v  06/23/2014   CLINICAL DATA:  Abdominal distention, generalized weakness and cough. Subsequent encounter.  EXAM: PORTABLE ABDOMEN - 1 VIEW  COMPARISON:  CT of the abdomen and pelvis performed 06/19/2014  FINDINGS: The visualized bowel gas pattern is unremarkable. Scattered air-filled loops of small and large bowel are seen, without evidence of bowel dilatation to suggest obstruction at this time. The patient's common hepatic duct stent is again noted overlying expected position. Clips are seen at the right upper quadrant, reflecting prior cholecystectomy.  No acute osseous abnormalities are seen. Residual contrast is noted within the rectum. Known small bilateral pleural effusions are not well characterized on this study.  IMPRESSION: Unremarkable bowel gas pattern; no free intra-abdominal air seen. No evidence of bowel obstruction at this time.   Electronically Signed   By: Garald Balding M.D.   On: 06/23/2014 10:01   Dg C-arm 1-60 Min-no Report  06/09/2014   CLINICAL DATA:  Biliary stent placement, preceding PTC secondary to pancreatic mass with CBD obstruction  EXAM: ABDOMEN - 1 VIEW; DG C-ARM 1-60 MIN - NRPT [facility]  COMPARISON:  Two digital C-arm fluoroscopic images obtained during the procedure are submitted for interpretation.  FLUOROSCOPY TIME:  2 min  24 seconds  FINDINGS: First image demonstrates presence of an endoscope with a small amount of contrast and a dilated CBD proximal to the scope.  Second image demonstrates presence of a CBD wall stent with tapered narrowing of the distal aspect of the stent compatible with extrinsic compression by known pancreatic head mass.  IMPRESSION: CBD stenting.   Electronically Signed   By: Lavonia Dana M.D.   On: 06/09/2014 14:26   Dg C-arm 1-60 Min-no Report  06/08/2014   CLINICAL DATA:  Pancreatic carcinoma  EXAM: DG C-ARM 1-60 MIN - NRPT [facility]; ABDOMEN - 1 VIEW  COMPARISON:  05/31/2014  FINDINGS: Multiple spot films were obtained during and attempted ERCP. Access to the common bile duct was not able to be performed due to the known pancreatic mass.  FLUOROSCOPY TIME:  5 min 6 seconds  IMPRESSION: Inability to cannulate the CBD.   Electronically Signed   By: Inez Catalina M.D.   On: 06/08/2014 12:04    Microbiology: Recent Results (from the past 240 hour(s))  Culture, blood (routine x 2)     Status: None   Collection Time: 06/19/14 12:55 PM  Result Value Ref Range Status   Specimen Description BLOOD LEFT ANTECUBITAL  Final   Special Requests BOTTLES DRAWN AEROBIC AND ANAEROBIC 5ML  Final   Culture   Final    NO GROWTH 5 DAYS Performed at [facility]    Report Status 06/25/2014 FINAL  Final  Culture, blood (routine x 2)     Status: None   Collection Time: 06/19/14 12:59 PM  Result Value Ref Range Status   Specimen Description BLOOD LEFT FOREARM  Final   Special Requests BOTTLES DRAWN AEROBIC AND ANAEROBIC 4ML  Final   Culture  Final    NO GROWTH 5 DAYS Performed at [facility]    Report Status 06/25/2014 FINAL  Final  MRSA PCR Screening     Status: None   Collection Time: 06/19/14  5:19 PM  Result Value Ref Range Status   MRSA by PCR NEGATIVE NEGATIVE Final    Comment:        The GeneXpert MRSA Assay (FDA approved for NASAL specimens only), is one component of  a comprehensive MRSA colonization surveillance program. It is not intended to diagnose MRSA infection nor to guide or monitor treatment for MRSA infections.   Urine culture     Status: None   Collection Time: 06/19/14  6:39 PM  Result Value Ref Range Status   Specimen Description URINE, CATHETERIZED  Final   Special Requests NONE  Final   Colony Count NO GROWTH Performed at [facility]   Final   Culture NO GROWTH Performed at [facility]   Final   Report Status 06/21/2014 FINAL  Final  Clostridium Difficile by PCR     Status: None   Collection Time: 06/19/14  7:40 PM  Result Value Ref Range Status   C difficile by pcr NEGATIVE NEGATIVE Final    Comment: Performed at [place]: Basic Metabolic Panel:  Recent Labs Lab 06/19/14 1254 06/19/14 1528 06/20/14 0420 06/21/14 0500 06/23/14 0505  NA 130*  --  132* 137 140  K 3.9  --  3.2* 3.6 3.9  CL 100  --  104 109 111  CO2 18*  --  20 18* 17*  GLUCOSE 326*  --  310* 287* 205*  BUN 81*  --  75* 80* 75*  CREATININE 1.77*  --  1.59* 2.01* 2.13*  CALCIUM 8.1*  --  7.4* 7.4* 7.6*  MG  --  2.2  --   --   --   PHOS  --  5.6*  --   --   --    Liver Function Tests:  Recent Labs Lab 06/19/14 1254 06/20/14 0420 06/21/14 0500 06/23/14 0505  AST 34 $Remo'22 19 18  'KSVCG$ ALT $Rem'18 13 12 10  'JZsn$ ALKPHOS 122* 101 94 96  BILITOT 1.1 0.9 0.9 0.9  PROT 6.1 5.1* 4.9* 5.0*  ALBUMIN 1.7* 1.3* 1.3* 1.3*    Recent Labs Lab 06/19/14 1254 06/19/14 1528  LIPASE 21 19   No results for input(s): AMMONIA in the last 168 hours. CBC:  Recent Labs Lab 06/19/14 1254 06/20/14 0420 06/21/14 0500 06/23/14 0505  WBC 48.7* 31.6* 32.2* 38.0*  NEUTROABS 45.0*  --   --   --   HGB 7.3* 7.0* 7.2* 7.4*  HCT 23.6* 22.0* 22.5* 24.4*  MCV 93.3 92.4 93.8 95.7  PLT 271 222 225 153   Cardiac Enzymes: No results for input(s): CKTOTAL, CKMB, CKMBINDEX, TROPONINI in the last 168 hours. BNP: BNP (last 3 results)  Recent  Labs  01/12/14 1244  PROBNP 67.7   CBG:  Recent Labs Lab 06/22/14 1209 06/22/14 1655 06/22/14 2220 06/23/14 0733 06/23/14 1138  GLUCAP 237* 208* 171* 184* 218*       Signed:  FELIZ ORTIZ, ABRAHAM  [facility] 06/25/2014, 8:08 AM

## 2014-06-24 NOTE — Progress Notes (Signed)
PT Cancellation Note  Patient Details Name: DEAVIN FORST MRN: [id number] DOB: 1941-01-15   Cancelled Treatment:     per chart review of MD PN and labs, pt unable to tolerate PT.  Noted Comfort Care.   Rica Koyanagi  PTA [facility]  Acute  Rehab Pager      [phone]

## 2014-06-25 ENCOUNTER — Encounter: Payer: Self-pay | Admitting: Radiation Oncology

## 2014-06-25 ENCOUNTER — Ambulatory Visit: Payer: Medicare Other

## 2014-06-25 LAB — CULTURE, BLOOD (ROUTINE X 2)
CULTURE: NO GROWTH
CULTURE: NO GROWTH

## 2014-06-25 NOTE — Progress Notes (Signed)
Pt for discharge Home with [facility].   CSW received notification that pt needing ambulance transport home.   CSW met with pt and multiple family members at bedside. CSW confirmed address.  CSW arranged ambulance transport via [place] [facility] for pt to pt home.   Pt family appreciative of CSW support and assistance.   No further social work needs identified at this time.  CSW signing off.   Alison Murray, MSW, [facility] [phone]

## 2014-06-25 NOTE — Progress Notes (Signed)
[facility] PROGRESS NOTE Interim History: 74 year old male w/ known dx of pancreatic cancer and also sig h/o recurrent c-diff. F/b [facility]. Recently d/c 12/28 after being admitted for septic shock from cholangitis in setting of CBD stricture for which he was treated w/ abx and had biliary stent, then d/c to SNF for rehab. Presents to [facility] ER from cancer center (XRT) on 1/5 w/ cc: pre-syncope, frequent diarrhea and episodes of vomiting for last 24-48 hrs   Assessment/Plan: Septic shock unknown source presumable abdominal possible  Enteritis due to Clostridium difficile: AKI (acute kidney injury) Protein-calorie malnutrition, severe due to Pancreatic cancer CA head of pancreas Non anion gap Metabolic acidosis: Anemia of critical illness: Thrush w/ associated odynophagia : Uncontrolled Diabetes mellitus:  No events overnht, home on prednisone, benadryl and nexium. He would like to stop c. Dif treatment.  Code Status: DNR Family Communication: daughter  Disposition Plan: inpatient   Consultants:  PCCM   Procedures:  CT abd   cxr  Antibiotics: BCx2 1/5>>> UC 1/5>>> negative cdiff 1/5>> neg IV vanc 1/5>>> 1/7 Zosyn 1/5>>> 1/7  HPI/Subjective: No complains  Objective: Filed Vitals:   06/23/14 2050 06/24/14 1311 06/24/14 2118 06/25/14 0519  BP: 120/56 125/60 145/82 121/76  Pulse: 90 85 80 78  Temp: 97.9 F (36.6 C) 97.8 F (36.6 C) 97.4 F (36.3 C) 97.6 F (36.4 C)  TempSrc: Oral Oral Oral Oral  Resp: 18 18 20 19   Height:      Weight:      SpO2: 100% 93% 98% 99%    Intake/Output Summary (Last 24 hours) at 06/25/14 0805 Last data filed at 06/25/14 0519  Gross per 24 hour  Intake    960 ml  Output   1200 ml  Net   -240 ml   Filed Weights   06/20/14 0500 06/21/14 0500 06/22/14 0300  Weight: 135.4 kg (298 lb 8.1 oz) 137.1 kg (302 lb 4 oz) 137.1 kg (302 lb 4 oz)    Exam:  General: Alert, awake, oriented x3, in no acute distress. He is 3rd  spacing HEENT: No bruits, no goiter.  Heart: Regular rate and rhythm. Lungs: Good air movement, clear Abdomen: distended abd.  Data Reviewed: Basic Metabolic Panel:  Recent Labs Lab 06/19/14 1254 06/19/14 1528 06/20/14 0420 06/21/14 0500 06/23/14 0505  NA 130*  --  132* 137 140  K 3.9  --  3.2* 3.6 3.9  CL 100  --  104 109 111  CO2 18*  --  20 18* 17*  GLUCOSE 326*  --  310* 287* 205*  BUN 81*  --  75* 80* 75*  CREATININE 1.77*  --  1.59* 2.01* 2.13*  CALCIUM 8.1*  --  7.4* 7.4* 7.6*  MG  --  2.2  --   --   --   PHOS  --  5.6*  --   --   --    Liver Function Tests:  Recent Labs Lab 06/19/14 1254 06/20/14 0420 06/21/14 0500 06/23/14 0505  AST 34 22 19 18   ALT 18 13 12 10   ALKPHOS 122* 101 94 96  BILITOT 1.1 0.9 0.9 0.9  PROT 6.1 5.1* 4.9* 5.0*  ALBUMIN 1.7* 1.3* 1.3* 1.3*    Recent Labs Lab 06/19/14 1254 06/19/14 1528  LIPASE 21 19   No results for input(s): AMMONIA in the last 168 hours. CBC:  Recent Labs Lab 06/19/14 1254 06/20/14 0420 06/21/14 0500 06/23/14 0505  WBC 48.7* 31.6* 32.2* 38.0*  NEUTROABS  45.0*  --   --   --   HGB 7.3* 7.0* 7.2* 7.4*  HCT 23.6* 22.0* 22.5* 24.4*  MCV 93.3 92.4 93.8 95.7  PLT 271 222 225 153   Cardiac Enzymes: No results for input(s): CKTOTAL, CKMB, CKMBINDEX, TROPONINI in the last 168 hours. BNP (last 3 results)  Recent Labs  01/12/14 1244  PROBNP 67.7   CBG:  Recent Labs Lab 06/22/14 1209 06/22/14 1655 06/22/14 2220 06/23/14 0733 06/23/14 1138  GLUCAP 237* 208* 171* 184* 218*    Recent Results (from the past 240 hour(s))  Culture, blood (routine x 2)     Status: None   Collection Time: 06/19/14 12:55 PM  Result Value Ref Range Status   Specimen Description BLOOD LEFT ANTECUBITAL  Final   Special Requests BOTTLES DRAWN AEROBIC AND ANAEROBIC 5ML  Final   Culture   Final    NO GROWTH 5 DAYS Performed at [facility]    Report Status 06/25/2014 FINAL  Final  Culture, blood (routine x  2)     Status: None   Collection Time: 06/19/14 12:59 PM  Result Value Ref Range Status   Specimen Description BLOOD LEFT FOREARM  Final   Special Requests BOTTLES DRAWN AEROBIC AND ANAEROBIC 4ML  Final   Culture   Final    NO GROWTH 5 DAYS Performed at [facility]    Report Status 06/25/2014 FINAL  Final  MRSA PCR Screening     Status: None   Collection Time: 06/19/14  5:19 PM  Result Value Ref Range Status   MRSA by PCR NEGATIVE NEGATIVE Final    Comment:        The GeneXpert MRSA Assay (FDA approved for NASAL specimens only), is one component of a comprehensive MRSA colonization surveillance program. It is not intended to diagnose MRSA infection nor to guide or monitor treatment for MRSA infections.   Urine culture     Status: None   Collection Time: 06/19/14  6:39 PM  Result Value Ref Range Status   Specimen Description URINE, CATHETERIZED  Final   Special Requests NONE  Final   Colony Count NO GROWTH Performed at [facility]   Final   Culture NO GROWTH Performed at [facility]   Final   Report Status 06/21/2014 FINAL  Final  Clostridium Difficile by PCR     Status: None   Collection Time: 06/19/14  7:40 PM  Result Value Ref Range Status   C difficile by pcr NEGATIVE NEGATIVE Final    Comment: Performed at [facility]     Studies: Dg Abd Portable 1v  06/23/2014   CLINICAL DATA:  Abdominal distention, generalized weakness and cough. Subsequent encounter.  EXAM: PORTABLE ABDOMEN - 1 VIEW  COMPARISON:  CT of the abdomen and pelvis performed 06/19/2014  FINDINGS: The visualized bowel gas pattern is unremarkable. Scattered air-filled loops of small and large bowel are seen, without evidence of bowel dilatation to suggest obstruction at this time. The patient's common hepatic duct stent is again noted overlying expected position. Clips are seen at the right upper quadrant, reflecting prior cholecystectomy.  No acute osseous abnormalities  are seen. Residual contrast is noted within the rectum. Known small bilateral pleural effusions are not well characterized on this study.  IMPRESSION: Unremarkable bowel gas pattern; no free intra-abdominal air seen. No evidence of bowel obstruction at this time.   Electronically Signed   By: Garald Balding M.D.   On: 06/23/2014 10:01  Scheduled Meds: . antiseptic oral rinse  7 mL Mouth Rinse q12n4p  . chlorhexidine  15 mL Mouth Rinse BID  . enoxaparin (LOVENOX) injection  70 mg Subcutaneous Q1200  . feeding supplement (RESOURCE BREEZE)  1 Container Oral Q24H  . insulin glargine  20 Units Subcutaneous QHS  . metroNIDAZOLE  500 mg Oral 3 times per day  . phenazopyridine  200 mg Oral TID WC  . polyethylene glycol  17 g Oral Daily  . predniSONE  60 mg Oral Q breakfast  . ranitidine  150 mg Oral BID  . sertraline  25 mg Oral Daily  . vancomycin  500 mg Oral 4 times per day   Continuous Infusions: . sodium chloride 10 mL/hr at 06/23/14 2021     Charlynne Cousins  [facility] Pager [phone]. If 8PM-8AM, please contact night-coverage at [email or web address], password [facility] 06/25/2014, 8:05 AM  LOS: 6 days

## 2014-06-26 ENCOUNTER — Ambulatory Visit: Payer: Medicare Other

## 2014-06-26 DIAGNOSIS — C250 Malignant neoplasm of head of pancreas: Secondary | ICD-10-CM | POA: Diagnosis not present

## 2014-06-26 DIAGNOSIS — Z510 Encounter for antineoplastic radiation therapy: Secondary | ICD-10-CM | POA: Diagnosis not present

## 2014-06-27 ENCOUNTER — Ambulatory Visit: Payer: Medicare Other | Admitting: Physician Assistant

## 2014-06-27 ENCOUNTER — Ambulatory Visit: Payer: Medicare Other

## 2014-06-27 ENCOUNTER — Telehealth: Payer: Self-pay | Admitting: *Deleted

## 2014-06-27 ENCOUNTER — Other Ambulatory Visit: Payer: Medicare Other

## 2014-06-28 ENCOUNTER — Ambulatory Visit: Payer: Medicare Other

## 2014-06-29 ENCOUNTER — Ambulatory Visit: Payer: Medicare Other

## 2014-07-01 ENCOUNTER — Ambulatory Visit: Payer: Medicare Other

## 2014-07-02 ENCOUNTER — Ambulatory Visit: Payer: Medicare Other

## 2014-07-03 ENCOUNTER — Ambulatory Visit: Payer: Medicare Other

## 2014-07-03 ENCOUNTER — Encounter: Payer: Self-pay | Admitting: *Deleted

## 2014-07-04 ENCOUNTER — Ambulatory Visit: Payer: Medicare Other

## 2014-07-04 ENCOUNTER — Ambulatory Visit: Payer: Medicare Other | Admitting: Oncology

## 2014-07-04 ENCOUNTER — Other Ambulatory Visit: Payer: Medicare Other

## 2014-07-04 ENCOUNTER — Telehealth: Payer: Self-pay

## 2014-07-04 NOTE — Telephone Encounter (Signed)
Patient died @ Home per [facility] °

## 2014-07-05 ENCOUNTER — Ambulatory Visit: Payer: Medicare Other

## 2014-07-06 ENCOUNTER — Ambulatory Visit: Payer: Medicare Other

## 2014-07-09 ENCOUNTER — Ambulatory Visit: Payer: Medicare Other

## 2014-07-10 ENCOUNTER — Ambulatory Visit: Payer: Medicare Other

## 2014-07-11 ENCOUNTER — Ambulatory Visit: Payer: Medicare Other

## 2014-07-12 ENCOUNTER — Ambulatory Visit: Payer: Medicare Other

## 2014-07-13 ENCOUNTER — Ambulatory Visit: Payer: Medicare Other

## 2014-07-13 NOTE — Progress Notes (Signed)
  Radiation Oncology         [phone] ________________________________  Name: [name] MRN: [id number]  Date: 06/25/2014  DOB: 12-Jul-1940  End of Treatment Note  Diagnosis:   Pancreatic cancer     Indication for treatment:  Palliative       Radiation treatment dates:   06/11/2014 through 06/22/2014  Site/dose:   The patient was treated to the pancreatic tumor and surrounding high-risk region. The patient was planned to receive 54 gray in 28 fractions using a IMRT technique with daily image guidance.  The patient was treated on our tomotherapy treatment machine. Unfortunately, the patient's status declined and he ultimately received a total of 8 treatments out of the planned 28 treatments before discontinuing treatment.  Narrative: The patient was initially treated as an inpatient. He was discharged but returned to the hospital after further difficulties. Overall his status declined and he ultimately made the decision to discontinue treatment after 8 fractions. He appeared to tolerate the radiation treatment reasonably well without significant acute toxicity but his overall status was poor.  Plan: The patient has completed radiation treatment. he will return to clinic on a when necessary basis.  ________________________________  Jodelle Gross, M.D., Ph.D.

## 2014-07-16 ENCOUNTER — Ambulatory Visit: Payer: Medicare Other

## 2014-07-16 NOTE — Telephone Encounter (Signed)
Left message for Dr. Hazeline Junker RN to inform them of No Show for today's appt.

## 2014-07-16 DEATH — deceased

## 2014-07-17 ENCOUNTER — Ambulatory Visit: Payer: Medicare Other

## 2014-07-18 ENCOUNTER — Ambulatory Visit: Payer: Medicare Other

## 2014-07-19 ENCOUNTER — Ambulatory Visit: Payer: Medicare Other

## 2014-07-20 ENCOUNTER — Ambulatory Visit: Payer: Medicare Other

## 2014-07-23 ENCOUNTER — Ambulatory Visit: Payer: Medicare Other

## 2014-07-24 ENCOUNTER — Ambulatory Visit: Payer: Medicare Other

## 2014-08-02 ENCOUNTER — Ambulatory Visit: Payer: Medicare Other | Admitting: Cardiovascular Disease

## 2015-04-18 IMAGING — CT CT ABD-PELV W/O CM
1 of 2 series · 15 of 32 positions shown, 19 images · IV contrast (OMNIPAQUE 300)
Comparison: 05/31/2014

CLINICAL DATA: Progressive weakness with fever, history of
pancreatic carcinoma and hypotension

EXAM:
CT ABDOMEN AND PELVIS WITHOUT CONTRAST
TECHNIQUE: Multidetector CT imaging of the abdomen and pelvis was performed
following the standard protocol without IV contrast.

[Series 2: abd/pel w.o · axial · 0.91mm/px · z∈[+926,+1391]mm · 15 of 103 slices shown, 19 images]
[im 5/103  soft-tissue]
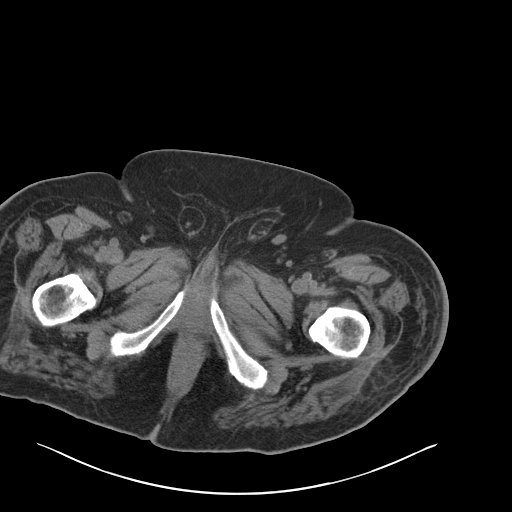
[im 5/103  bone]
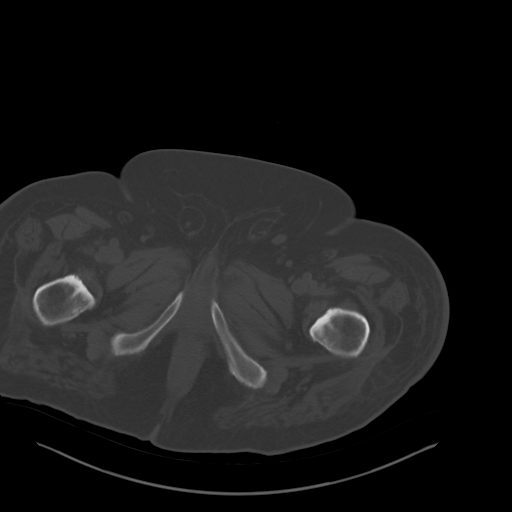
[im 13/103  soft-tissue]
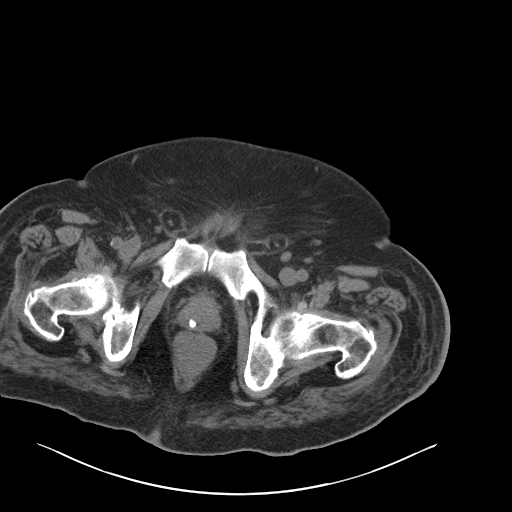
[im 22/103  soft-tissue]
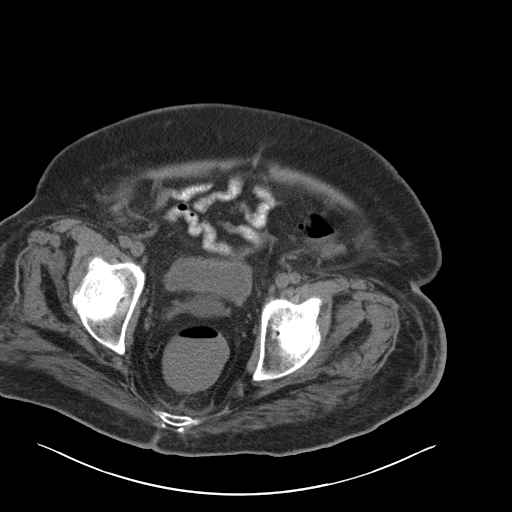
[im 30/103  soft-tissue]
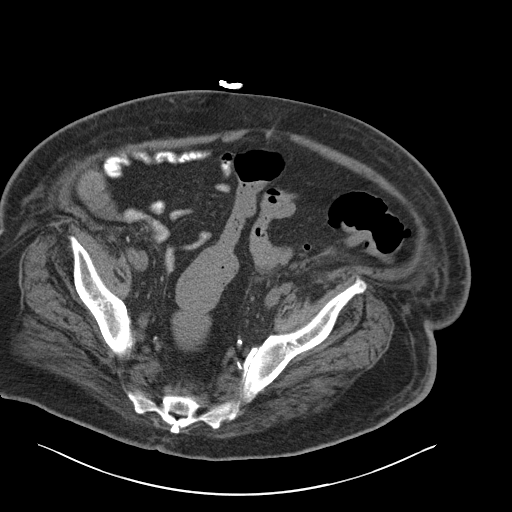
[im 35/103  soft-tissue]
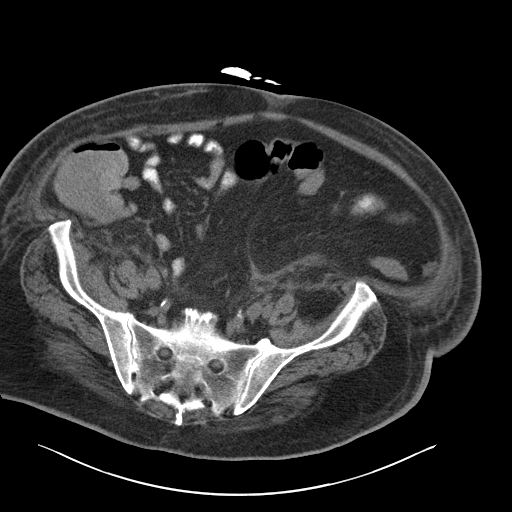
[im 43/103  soft-tissue]
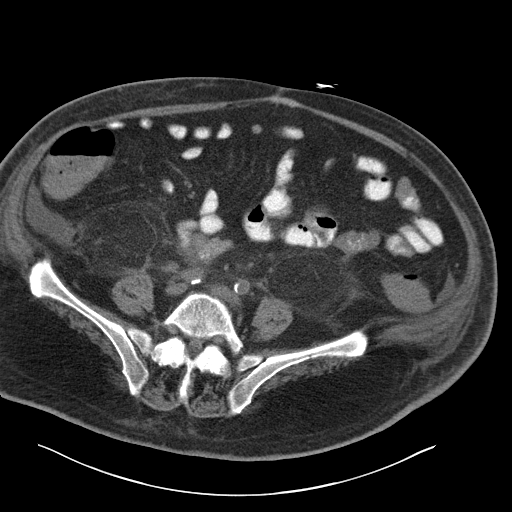
[im 52/103  soft-tissue]
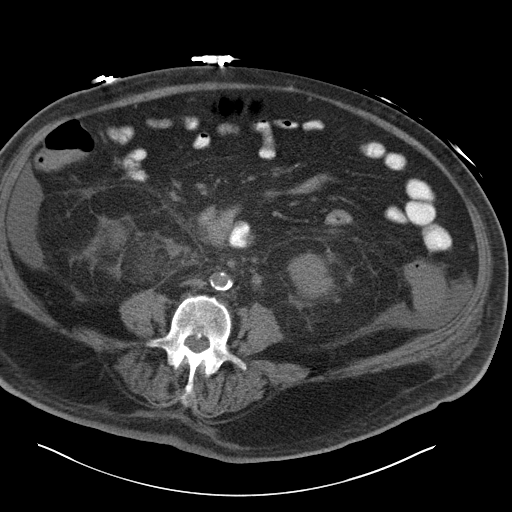
[im 60/103  soft-tissue]
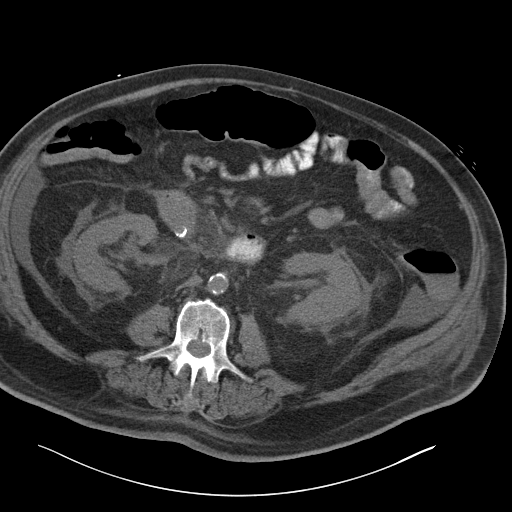
[im 69/103  soft-tissue]
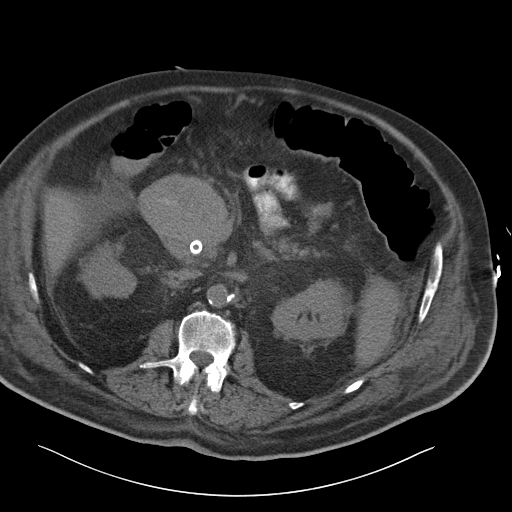
[im 69/103  bone]
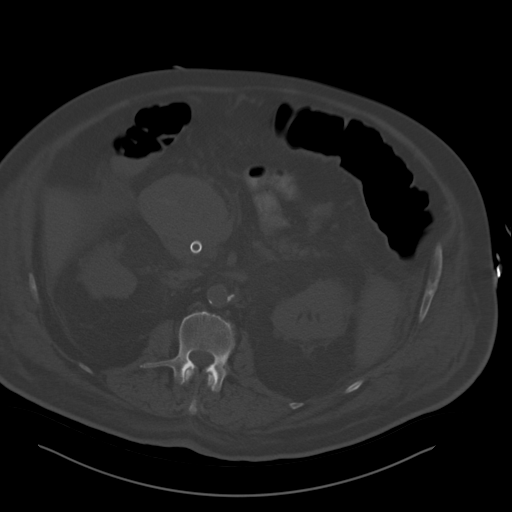
[im 73/103  soft-tissue]
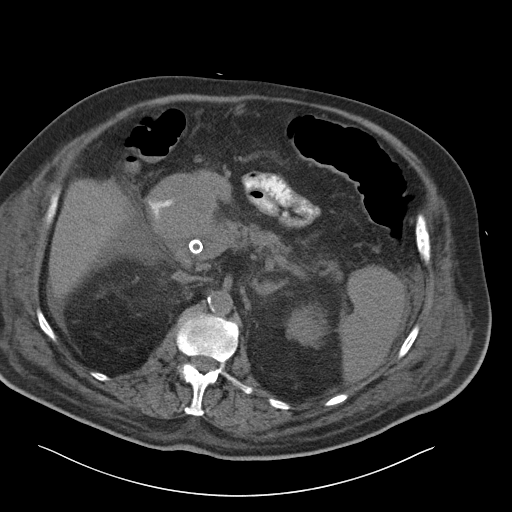
[im 81/103  soft-tissue]
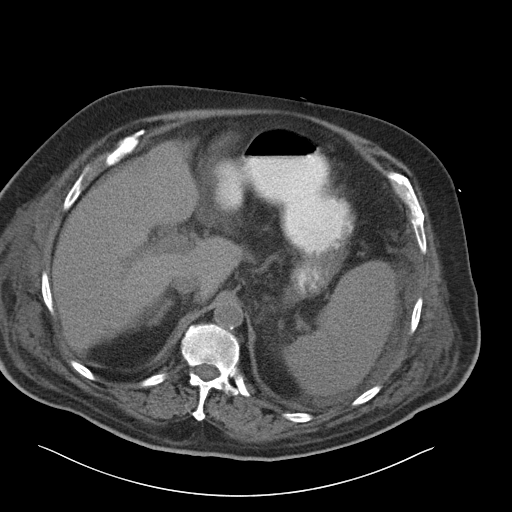
[im 86/103  lung]
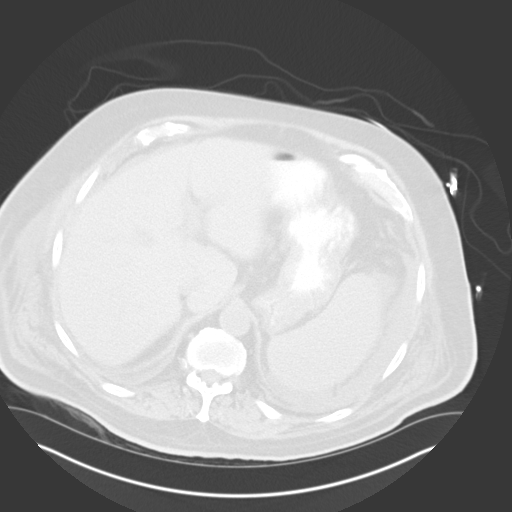
[im 90/103  soft-tissue]
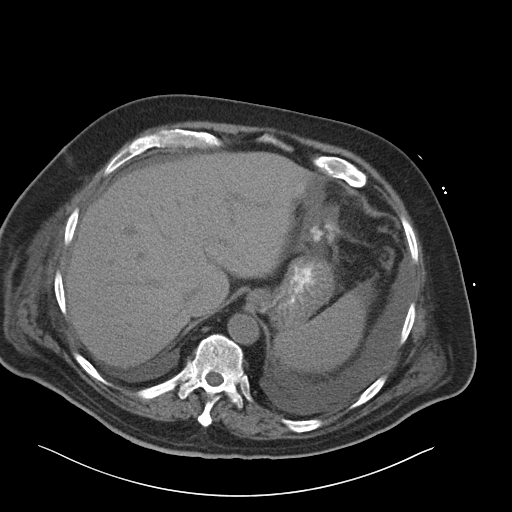
[im 90/103  lung]
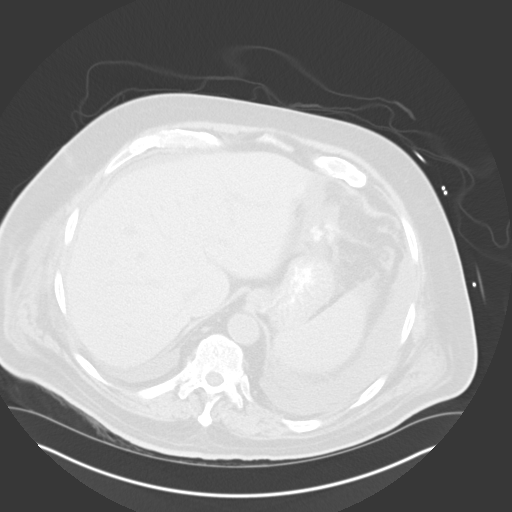
[im 94/103  lung]
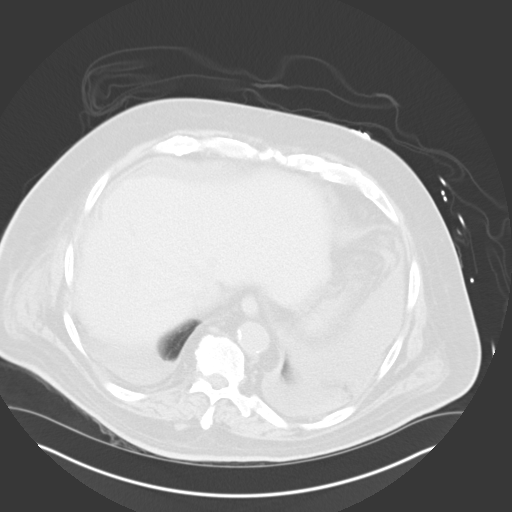
[im 98/103  soft-tissue]
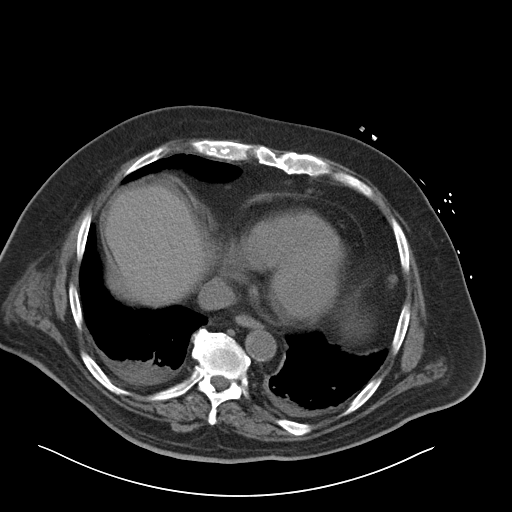
[im 98/103  lung]
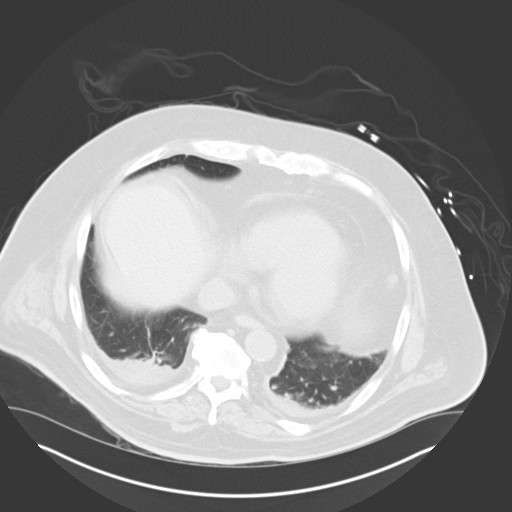

[15 of 32 positions shown; findings below may reference images not displayed]

FINDINGS: Lung bases demonstrate bibasilar atelectatic changes and small
pleural effusions.

The liver, spleen, adrenal glands and pancreas are stable in
appearance. A persistent large pancreatic head mass is noted with
impression upon the duodenum and common bile duct. A new common bile
duct stent is seen. No biliary ductal dilatation is noted. Kidneys
are well visualized bilaterally without obstructive changes. Some
renal calculi are noted which are nonobstructive in nature.

There remains some free fluid within the abdomen similar to that
seen on the prior exam.

The bladder is well distended. Some dependent density is noted
likely related to contrast excretion. Prostatic calcifications are
seen. No significant lymphadenopathy is noted the previously seen
peripancreatic lymph node is involved by the pancreatic mass and
less discrete on the current exam. No new significant adenopathy is
noted. Degenerative changes of the lumbar spine are noted.
Aortoiliac calcifications are seen.
IMPRESSION: Stable pancreatic head mass with new common bile duct stent.

The remainder of the exam is stable from the prior study.

## 2015-04-18 IMAGING — CR DG CHEST 2V
2 series · 2 of 2 positions shown · non-contrast
Comparison: 06/02/2014

CLINICAL DATA: Fever, hypotension, shortness of Breath

EXAM:
CHEST  2 VIEW

[w chest lat]
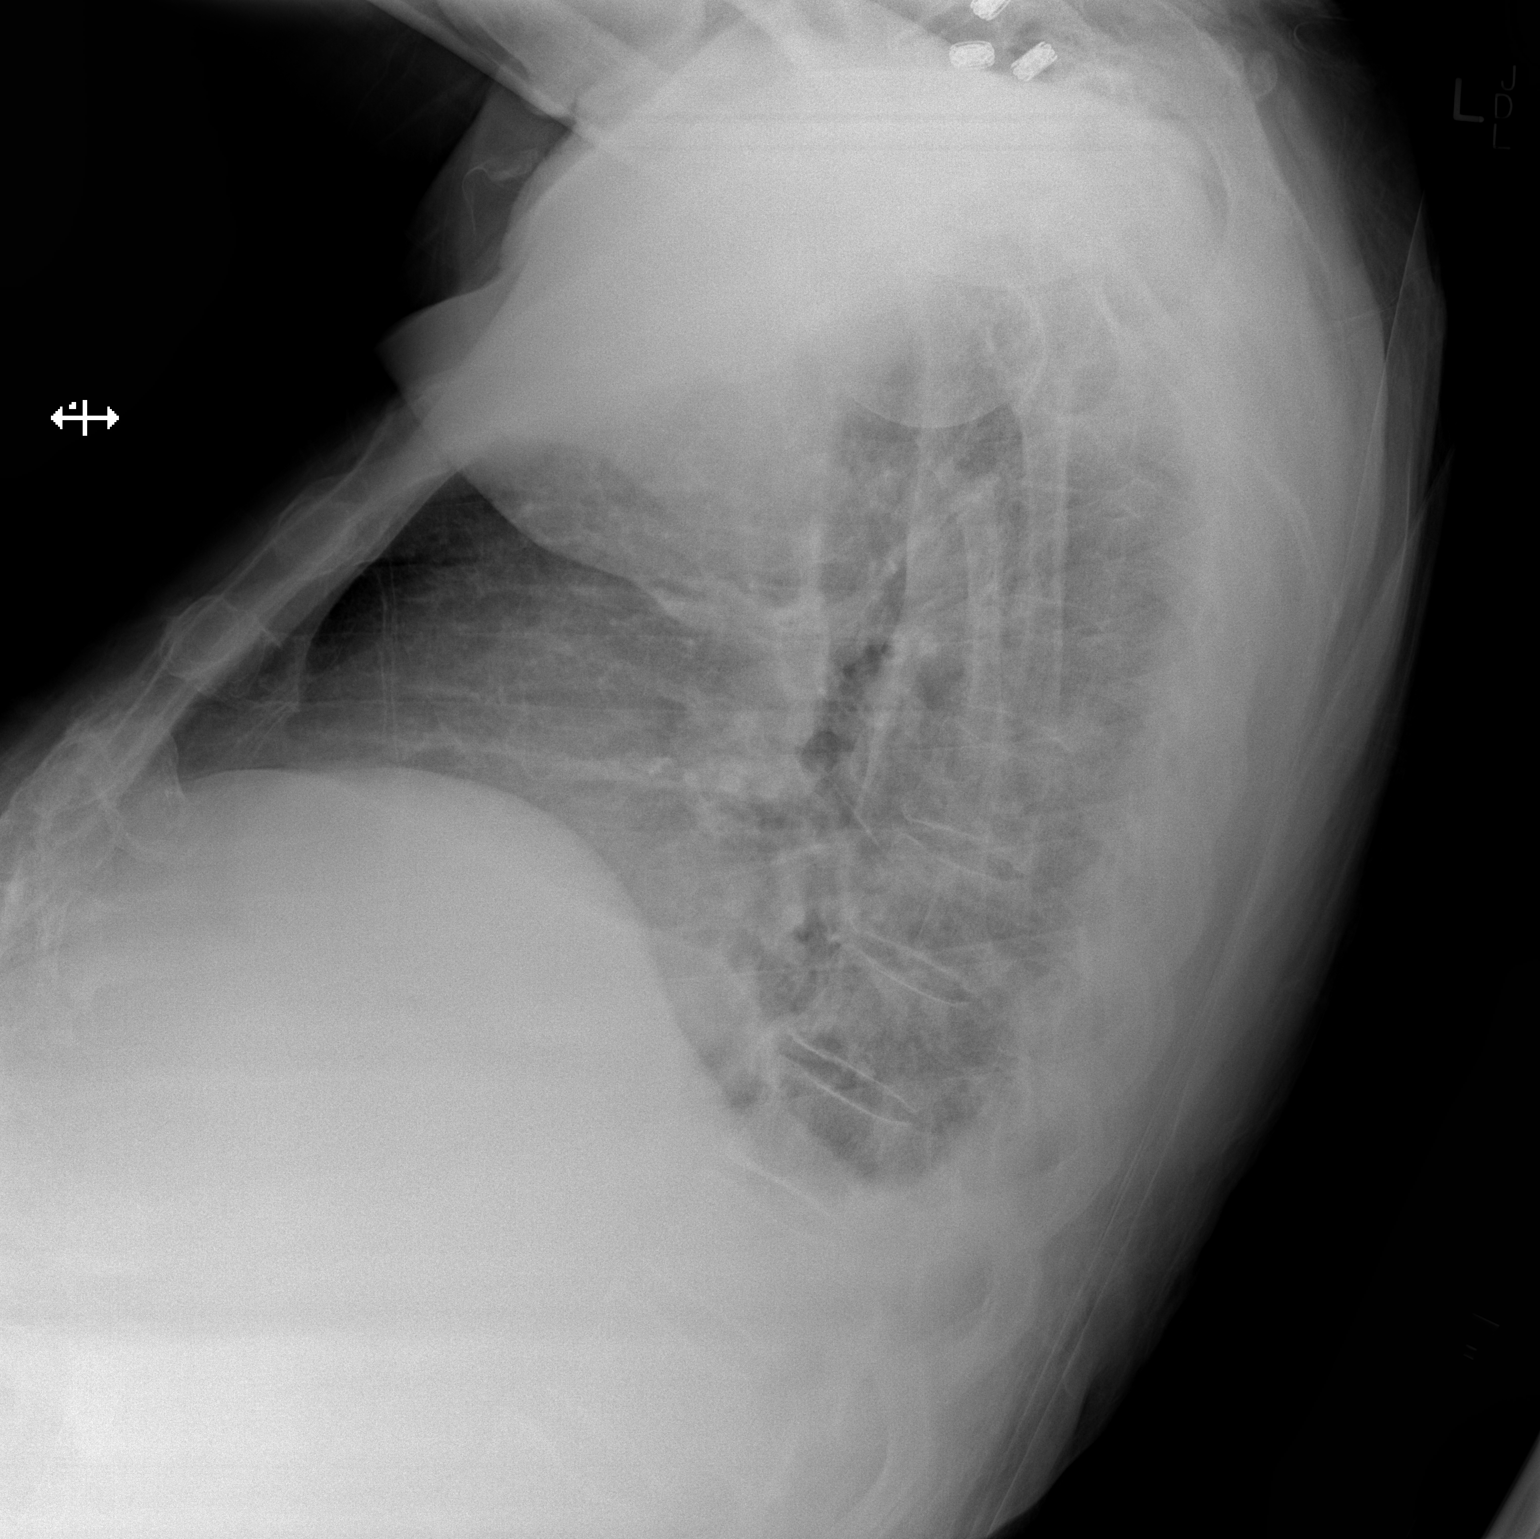

[x chest ap]
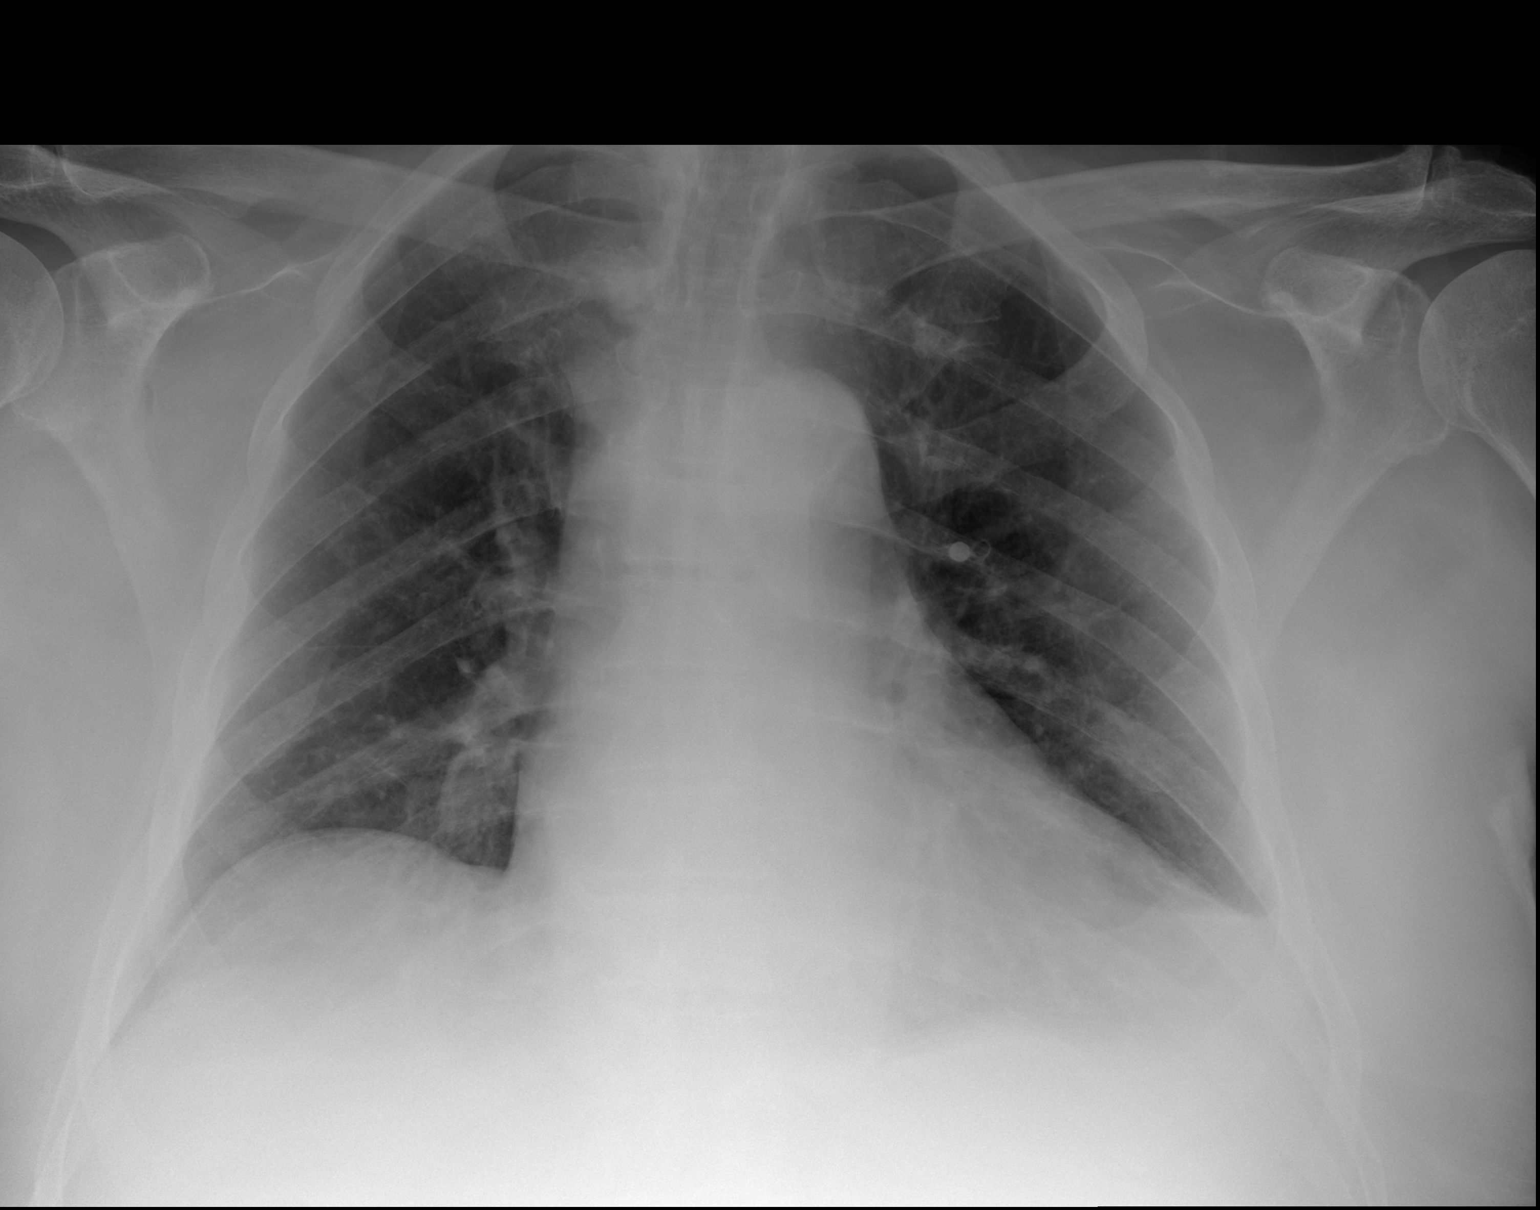

[2 of 2 positions shown; findings below may reference images not displayed]

FINDINGS: Cardiomediastinal silhouette is stable. No acute infiltrate or
pulmonary edema. Tiny left pleural effusion with left basilar
atelectasis.
IMPRESSION: No acute infiltrate or pulmonary edema. Tiny left pleural effusion
with left basilar atelectasis but

## 2015-04-18 IMAGING — CR DG CHEST 1V PORT
1 series · 1 of 1 positions shown · non-contrast
Comparison: Prior chest x-ray earlier today at [DATE] p.m.

CLINICAL DATA: 73-year-old male with septic shock. Interval central
line placement.

EXAM:
PORTABLE CHEST - 1 VIEW

[AP]
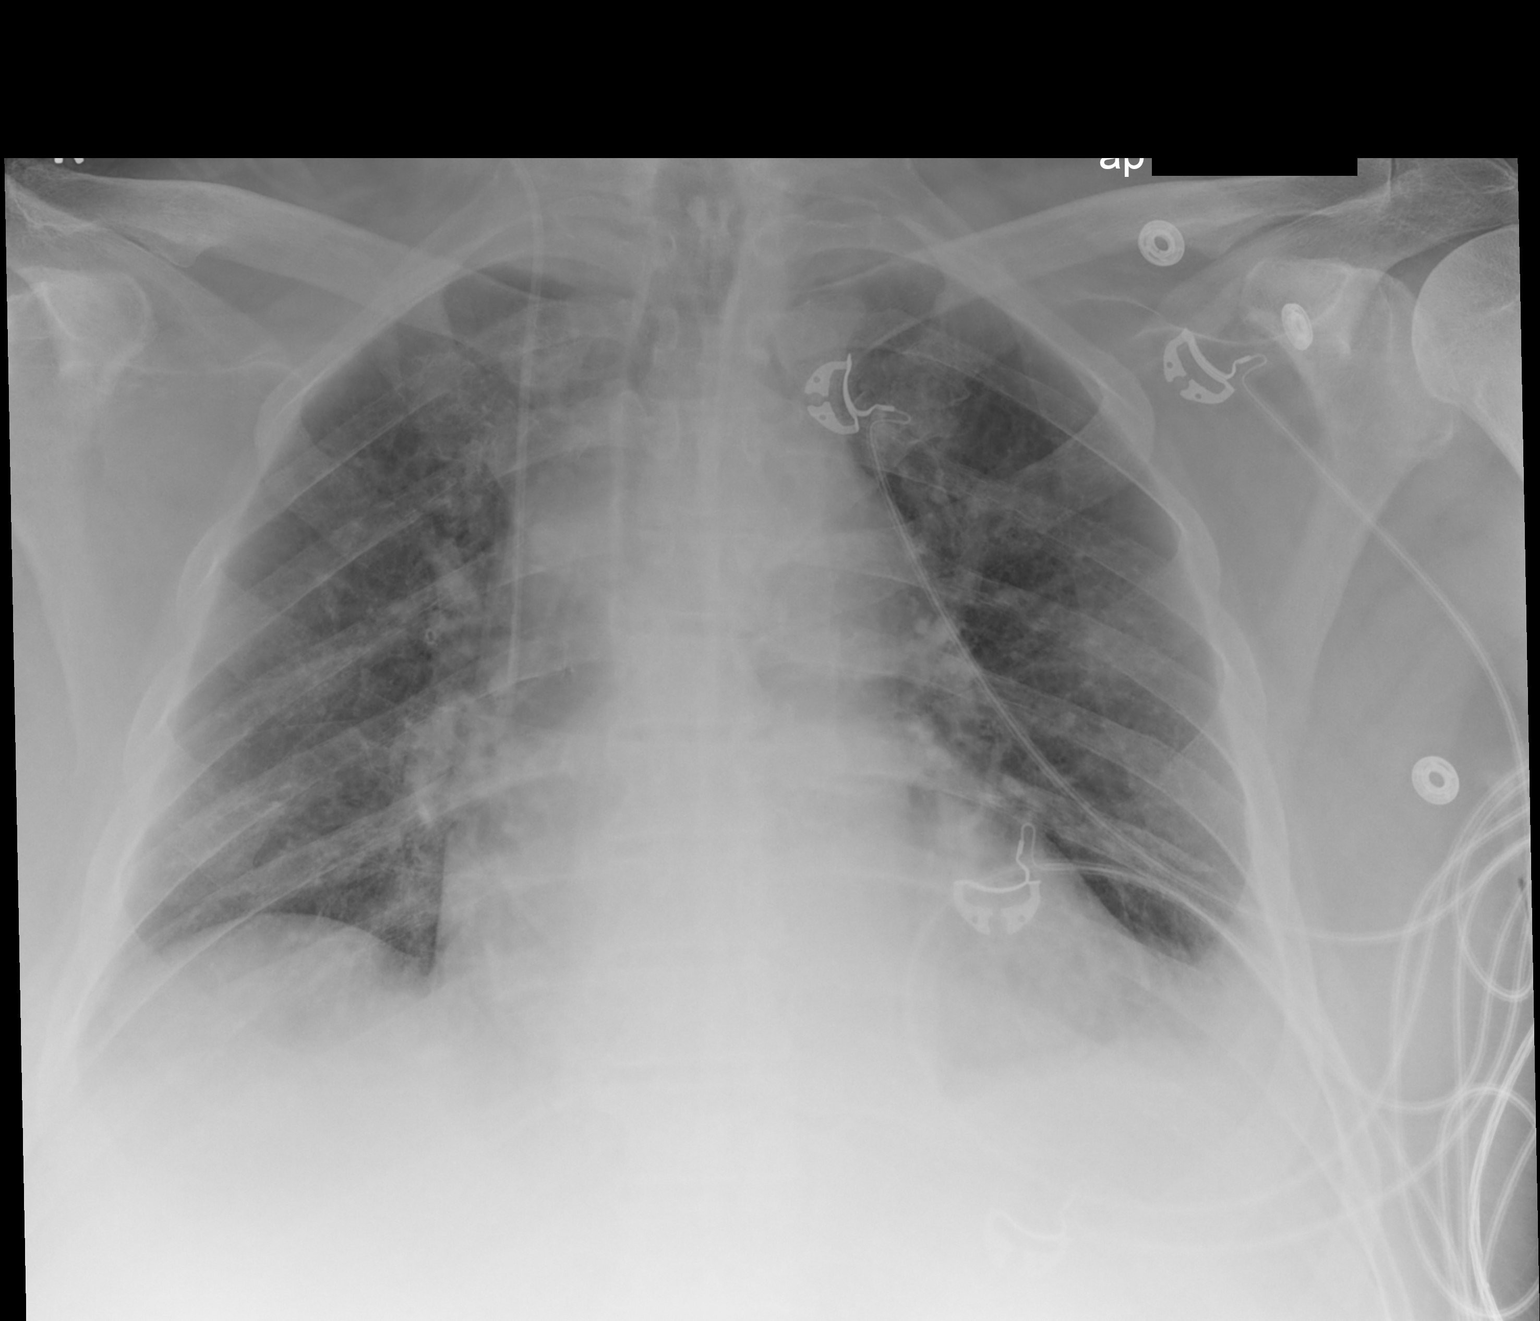

[1 of 1 positions shown; findings below may reference images not displayed]

FINDINGS: Interval placement of a right IJ approach central venous catheter.
The tip of the catheter overlies the distal SVC. No evidence of a
pneumothorax or new pleural effusion. The mediastinum appears
slightly widened, this is likely secondary to difference in
technique. Mild vascular congestion. Low inspiratory volumes. No new
airspace consolidation. No acute osseous abnormality.
IMPRESSION: 1. Interval placement of a right IJ central venous catheter.
Catheter tip overlies the superior cavoatrial junction. No evidence
of pneumothorax or other complicating feature.
2. Developing mild pulmonary vascular congestion.

## 2015-06-06 ENCOUNTER — Encounter: Payer: Self-pay | Admitting: Internal Medicine

## 2015-11-29 ENCOUNTER — Other Ambulatory Visit: Payer: Self-pay | Admitting: Nurse Practitioner
